# Patient Record
Sex: Male | Born: 1965 | Race: Black or African American | Hispanic: No | Smoking: Former smoker
Health system: Southern US, Community
[De-identification: ages and names within clinical notes are randomized; demographics above are authoritative.]

## PROBLEM LIST (undated history)

## (undated) DIAGNOSIS — I10 Essential (primary) hypertension: Secondary | ICD-10-CM

## (undated) DIAGNOSIS — E785 Hyperlipidemia, unspecified: Secondary | ICD-10-CM

## (undated) DIAGNOSIS — F1011 Alcohol abuse, in remission: Secondary | ICD-10-CM

## (undated) DIAGNOSIS — G5600 Carpal tunnel syndrome, unspecified upper limb: Secondary | ICD-10-CM

## (undated) DIAGNOSIS — K219 Gastro-esophageal reflux disease without esophagitis: Secondary | ICD-10-CM

## (undated) DIAGNOSIS — J302 Other seasonal allergic rhinitis: Secondary | ICD-10-CM

## (undated) DIAGNOSIS — I779 Disorder of arteries and arterioles, unspecified: Secondary | ICD-10-CM

## (undated) DIAGNOSIS — I2510 Atherosclerotic heart disease of native coronary artery without angina pectoris: Secondary | ICD-10-CM

## (undated) DIAGNOSIS — I639 Cerebral infarction, unspecified: Secondary | ICD-10-CM

## (undated) DIAGNOSIS — K760 Fatty (change of) liver, not elsewhere classified: Secondary | ICD-10-CM

## (undated) DIAGNOSIS — N183 Chronic kidney disease, stage 3 unspecified: Secondary | ICD-10-CM

## (undated) DIAGNOSIS — I739 Peripheral vascular disease, unspecified: Secondary | ICD-10-CM

## (undated) DIAGNOSIS — I219 Acute myocardial infarction, unspecified: Secondary | ICD-10-CM

## (undated) HISTORY — DX: Disorder of arteries and arterioles, unspecified: I77.9

## (undated) HISTORY — DX: Hyperlipidemia, unspecified: E78.5

## (undated) HISTORY — PX: OTHER SURGICAL HISTORY: SHX169

## (undated) HISTORY — DX: Acute myocardial infarction, unspecified: I21.9

## (undated) HISTORY — DX: Atherosclerotic heart disease of native coronary artery without angina pectoris: I25.10

## (undated) HISTORY — DX: Gastro-esophageal reflux disease without esophagitis: K21.9

## (undated) HISTORY — PX: CORONARY ANGIOPLASTY: SHX604

## (undated) HISTORY — DX: Carpal tunnel syndrome, unspecified upper limb: G56.00

## (undated) HISTORY — DX: Peripheral vascular disease, unspecified: I73.9

## (undated) HISTORY — DX: Essential (primary) hypertension: I10

## (undated) HISTORY — DX: Alcohol abuse, in remission: F10.11

## (undated) HISTORY — DX: Other seasonal allergic rhinitis: J30.2

---
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
## 2001-10-04 ENCOUNTER — Emergency Department (HOSPITAL_COMMUNITY): Admission: EM | Admit: 2001-10-04 | Discharge: 2001-10-04 | Payer: Self-pay | Admitting: Emergency Medicine

## 2012-02-07 DIAGNOSIS — I219 Acute myocardial infarction, unspecified: Secondary | ICD-10-CM

## 2012-02-07 HISTORY — DX: Acute myocardial infarction, unspecified: I21.9

## 2012-06-18 ENCOUNTER — Encounter: Payer: Self-pay | Admitting: *Deleted

## 2012-06-19 ENCOUNTER — Ambulatory Visit (INDEPENDENT_AMBULATORY_CARE_PROVIDER_SITE_OTHER): Payer: BC Managed Care – PPO | Admitting: Cardiology

## 2012-06-19 ENCOUNTER — Encounter: Payer: Self-pay | Admitting: Cardiology

## 2012-06-19 VITALS — BP 142/90 | HR 69 | Ht 71.5 in | Wt 247.0 lb

## 2012-06-19 DIAGNOSIS — E785 Hyperlipidemia, unspecified: Secondary | ICD-10-CM | POA: Insufficient documentation

## 2012-06-19 DIAGNOSIS — I10 Essential (primary) hypertension: Secondary | ICD-10-CM | POA: Insufficient documentation

## 2012-06-19 DIAGNOSIS — Z720 Tobacco use: Secondary | ICD-10-CM | POA: Insufficient documentation

## 2012-06-19 DIAGNOSIS — I2510 Atherosclerotic heart disease of native coronary artery without angina pectoris: Secondary | ICD-10-CM

## 2012-06-19 DIAGNOSIS — F1011 Alcohol abuse, in remission: Secondary | ICD-10-CM

## 2012-06-19 DIAGNOSIS — F17200 Nicotine dependence, unspecified, uncomplicated: Secondary | ICD-10-CM

## 2012-06-19 DIAGNOSIS — I252 Old myocardial infarction: Secondary | ICD-10-CM | POA: Insufficient documentation

## 2012-06-19 NOTE — Assessment & Plan Note (Signed)
Stable and asymptomatic. Continue secondary preventative therapy. He might benefit from a few weeks to cardiac rehabilitation she has not been involved in this. We'll look into see if his insurance will pay this for out from his infarct.  I strongly encouraged him to stop smoking. I made it very clear to his mother and him that he would remain at significant risk of having another heart attack if he continues to smoke.  Of also released him to go back to work at his former job starting 07/07/2012.  We'll see him back in the office in 3 months.

## 2012-06-19 NOTE — Patient Instructions (Addendum)
Your physician recommends that you schedule a follow-up appointment in: 3 MONTHS WITH TW  Your physician recommends that you return for lab work in: PRIOR TO YOUR FOLLOW UP OFFICE VISIT WITH YOUR PRIMARY CARE PHYSICIAN, PLEASE HAVE THEM FAX A COPY TO [place] A [phone], PLEASE HAVE THEM DRAW A FASTING LIPID PANEL AND CMP  YOUR PHYSICIAN RECOMMENDS THAT YOU STOP SMOKING FOR THE BENEFIT OF YOUR HEALTH  YOU MAY RETURN TO WORK AGAIN ON 07-07-12, LETTER ENCLOSED  WE WILL PLACE A REFERRAL FOR CARDIAC REHAB, SOMEONE WILL CALL YOU WITH THAT APPOINTMENT DATE AND TIME IF APPLICABLE

## 2012-06-19 NOTE — Progress Notes (Signed)
HPI Larry Pham is a 47 year old African  American male who comes today to establish with [place] in cardiology. He has been a patient at [facility] as well as Dr.Chauhan in [place], [place]. 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 He had this while having a stress test and [place]. He was transferred emergently to do her drug-eluting stent placed into his right coronary artery. In January and elective drug-eluting stent to the distal right coronary artery. His ejection fraction 60% by MRI which also showed some mild mitral regurgitation. There is a small infarct in the RCA territory.  He and his mother been unhappy with his care in [place].  He does have symptoms dyspnea on exertion. He denies any angina or chest pain. He is yet returned to work. 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 He continues to smoke about a pack and a half of cigarettes a day. He says he tried everything including Chantix.  Apparently he drank a fair amount of alcohol in the past but stopped altogether about 9 months ago.  Has a history of hypertension gastroesophageal reflux.  He seems to be compliant with his medications. He takes naproxen as needed for arthritis.  Past Medical History  Diagnosis Date  . Hypertension   . Carpal tunnel syndrome   . GERD (gastroesophageal reflux disease)   . Seasonal allergies   . History of ETOH abuse     STOPPED 9 MONTHS AGO  . Hyperlipidemia   . MI (myocardial infarction) 02-2012  . CAD (coronary artery disease)   . Coronary atherosclerosis of artery bypass graft     Current Outpatient Prescriptions  Medication Sig Dispense Refill  . aspirin 81 MG tablet Take 81 mg by mouth daily.      Marland Kitchen buPROPion (WELLBUTRIN) 75 MG tablet Take 150 mg by mouth 2 (two) times daily.      . Cholecalciferol (VITAMIN D-3) 1000 UNITS CAPS Take 2,000 Units by mouth every 8 (eight) hours.      . hydrochlorothiazide (MICROZIDE) 12.5 MG capsule  Take 12.5 mg by mouth 2 (two) times daily.      Marland Kitchen lisinopril (PRINIVIL,ZESTRIL) 40 MG tablet Take 40 mg by mouth daily.      . naproxen (NAPROSYN) 500 MG tablet Take 500 mg by mouth 2 (two) times daily with a meal.      . nitroGLYCERIN (NITROSTAT) 0.4 MG SL tablet Place 0.4 mg under the tongue every 5 (five) minutes as needed for chest pain.      Marland Kitchen omeprazole (PRILOSEC) 20 MG capsule Take 20 mg by mouth daily.      . simvastatin (ZOCOR) 20 MG tablet Take 20 mg by mouth every evening.      . Ticagrelor (BRILINTA) 90 MG TABS tablet Take 90 mg by mouth 2 (two) times daily.       No current facility-administered medications for this visit.    No Known Allergies  Family History  Problem Relation Age of Onset  . Hypertension Mother   . Heart disease Mother   . Cirrhosis Father     DIED AT AGE 60  . Breast cancer Paternal Grandmother     DECEASED    History   Social History  . Marital Status: Married    Spouse Name: N/A    Number of Children: N/A  . Years of Education: N/A   Occupational History  . Not on file.   Social History Main Topics  . Smoking  status: Current Every Day Smoker -- 1.50 packs/day  . Smokeless tobacco: Not on file  . Alcohol Use: Yes     Comment: SOCIALLY   . Drug Use: No  . Sexually Active: Not on file   Other Topics Concern  . Not on file   Social History Narrative  . No narrative on file    ROS ALL NEGATIVE EXCEPT THOSE NOTED IN HPI  PE  General Appearance: well developed, well nourished in no acute distress, muscular, overweight, premature graying HEENT: symmetrical face, PERRLA, good dentition  Neck: no JVD, thyromegaly, or adenopathy, trachea midline Chest: symmetric without deformity Cardiac: PMI non-displaced, RRR, normal S1, S2, no gallop or murmur Lung: clear to ausculation and percussion Vascular: all pulses full without bruits  Abdominal: nondistended, nontender, good bowel sounds, no HSM, no bruits Extremities: no cyanosis,  clubbing or edema, no sign of DVT, no varicosities  Skin: normal color, no rashes Neuro: alert and oriented x 3, non-focal Pysch: normal affect  EKG Normal sinus rhythm, T wave inversion in 1 and aVL as well as in V6. No acute changes. BMET No results found for this basename: na, k, cl, co2, glucose, bun, creatinine, calcium, gfrnonaa, gfraa    Lipid Panel  No results found for this basename: chol, trig, hdl, cholhdl, vldl, ldlcalc    CBC No results found for this basename: wbc, rbc, hgb, hct, plt, mcv, mch, mchc, rdw, neutrabs, lymphsabs, monoabs, eosabs, basosabs

## 2012-08-01 ENCOUNTER — Telehealth: Payer: Self-pay | Admitting: *Deleted

## 2012-08-01 NOTE — Telephone Encounter (Signed)
Noted pt recent OV advised to have labs performed with PCP upcoming, called pt PCP to receive labs via fax and was advised by Larry Pham that the labs were drawn on the first of March and they will fax them, will await receipt and give primary card MD for review and instructions if needed, will contact pt is neccasary

## 2012-08-01 NOTE — Telephone Encounter (Signed)
Received incoming labs via fax, placed on MD Wall per in office today

## 2012-08-01 NOTE — Telephone Encounter (Signed)
MD Wall signed off on labs to report no changes, no contact to pt needs to be made, placed in scan box

## 2012-08-02 ENCOUNTER — Encounter: Payer: Self-pay | Admitting: Cardiology

## 2012-09-18 ENCOUNTER — Ambulatory Visit: Payer: BC Managed Care – PPO | Admitting: Cardiology

## 2012-10-26 ENCOUNTER — Ambulatory Visit: Payer: BC Managed Care – PPO | Admitting: Cardiology

## 2013-03-01 ENCOUNTER — Other Ambulatory Visit: Payer: Self-pay | Admitting: *Deleted

## 2013-03-01 MED ORDER — NITROGLYCERIN 0.4 MG SL SUBL: 0.4000 mg | SUBLINGUAL_TABLET | SUBLINGUAL | Status: DC | PRN

## 2013-03-08 ENCOUNTER — Encounter: Payer: Self-pay | Admitting: *Deleted

## 2013-03-08 ENCOUNTER — Encounter: Payer: BC Managed Care – PPO | Admitting: Adult Health

## 2013-03-08 NOTE — Progress Notes (Signed)
    HPI: [name] is a 47 year old former patient of Dr. Juanito Doom we are following for ongoing management and assessment of known history of CAD, hypertension, ongoing tobacco abuse, and hyperlipidemia. The patient was continued on preventative therapy, and was recommended for cardiac rehabilitation. Patient was strongly encouraged to smoke stop smoking.  No Known Allergies  Current Outpatient Prescriptions  Medication Sig Dispense Refill  . aspirin 81 MG tablet Take 81 mg by mouth daily.      Marland Kitchen buPROPion (WELLBUTRIN) 75 MG tablet Take 150 mg by mouth 2 (two) times daily.      . Cholecalciferol (VITAMIN D-3) 1000 UNITS CAPS Take 2,000 Units by mouth every 8 (eight) hours.      . hydrochlorothiazide (MICROZIDE) 12.5 MG capsule Take 12.5 mg by mouth 2 (two) times daily.      Marland Kitchen lisinopril (PRINIVIL,ZESTRIL) 40 MG tablet Take 40 mg by mouth daily.      . naproxen (NAPROSYN) 500 MG tablet Take 500 mg by mouth 2 (two) times daily with a meal.      . nitroGLYCERIN (NITROSTAT) 0.4 MG SL tablet Place 1 tablet (0.4 mg total) under the tongue every 5 (five) minutes as needed for chest pain.  25 tablet  0  . omeprazole (PRILOSEC) 20 MG capsule Take 20 mg by mouth daily.      . simvastatin (ZOCOR) 20 MG tablet Take 20 mg by mouth every evening.      . Ticagrelor (BRILINTA) 90 MG TABS tablet Take 90 mg by mouth 2 (two) times daily.       No current facility-administered medications for this visit.    Past Medical History  Diagnosis Date  . Hypertension   . Carpal tunnel syndrome   . GERD (gastroesophageal reflux disease)   . Seasonal allergies   . History of ETOH abuse     STOPPED 9 MONTHS AGO  . Hyperlipidemia   . MI (myocardial infarction) 02-2012  . CAD (coronary artery disease)   . Coronary atherosclerosis of artery bypass graft     Past Surgical History  Procedure Laterality Date  . Coronary artery catheterization with stenting N/A 02-2102 AND 05-2012    ROS: PHYSICAL EXAM There  were no vitals taken for this visit.  EKG:  ASSESSMENT AND PLAN

## 2013-04-12 ENCOUNTER — Ambulatory Visit: Payer: BC Managed Care – PPO | Admitting: Interventional Cardiology

## 2013-05-10 ENCOUNTER — Encounter: Payer: Self-pay | Admitting: *Deleted

## 2013-06-04 NOTE — Progress Notes (Signed)
Clinical Summary [name] is a 48 y.o.male former patient of Dr Verl Blalock, this is our first visit together. He is seen for the following medical problems. He has also previously been followed at [facility] and in [place], [place]  1. CAD - prior inferior MI 02/2012 at [facility], s/p DES to RCA. LVEF 60%. - has had some chest pain. Sharp pain in midchest, 6/10. +SOB +palpitations. Typically occurs with exertion, often after long walks. Does not occur at rest. Not positional, resolves with rest. Improved with NG. Ongoing for 6 months. Typically occurs 2-3 times a week, stable severity, some increase in frequency.  - mixed compliance with meds: he is on brillinta and ASA, on since stent was placed.  - had been having some DOE at last visit  2. Hyperlipidemia - 06/2012 TC 204 TG 150 HDL 44 LDL 130 - reports he has been on lipitor on lower dose at that time  3. HTN - checks bp at 1-2 times per week. Typically around 140s/90s.   Past Medical History  Diagnosis Date  . Hypertension   . Carpal tunnel syndrome   . GERD (gastroesophageal reflux disease)   . Seasonal allergies   . History of ETOH abuse     STOPPED 9 MONTHS AGO  . Hyperlipidemia   . MI (myocardial infarction) 02-2012  . CAD (coronary artery disease)   . Coronary atherosclerosis of artery bypass graft      No Known Allergies   Current Outpatient Prescriptions  Medication Sig Dispense Refill  . aspirin 81 MG tablet Take 81 mg by mouth daily.      Marland Kitchen buPROPion (WELLBUTRIN) 75 MG tablet Take 150 mg by mouth 2 (two) times daily.      . Cholecalciferol (VITAMIN D-3) 1000 UNITS CAPS Take 2,000 Units by mouth every 8 (eight) hours.      . hydrochlorothiazide (MICROZIDE) 12.5 MG capsule Take 12.5 mg by mouth 2 (two) times daily.      Marland Kitchen lisinopril (PRINIVIL,ZESTRIL) 40 MG tablet Take 40 mg by mouth daily.      . naproxen (NAPROSYN) 500 MG tablet Take 500 mg by mouth 2 (two) times daily with a meal.      . nitroGLYCERIN  (NITROSTAT) 0.4 MG SL tablet Place 1 tablet (0.4 mg total) under the tongue every 5 (five) minutes as needed for chest pain.  25 tablet  0  . omeprazole (PRILOSEC) 20 MG capsule Take 20 mg by mouth daily.      . simvastatin (ZOCOR) 20 MG tablet Take 20 mg by mouth every evening.      . Ticagrelor (BRILINTA) 90 MG TABS tablet Take 90 mg by mouth 2 (two) times daily.       No current facility-administered medications for this visit.     Past Surgical History  Procedure Laterality Date  . Coronary artery catheterization with stenting N/A 02-2102 AND 05-2012     No Known Allergies    Family History  Problem Relation Age of Onset  . Hypertension Mother   . Heart disease Mother   . Cirrhosis Father     DIED AT AGE 80  . Breast cancer Paternal Grandmother     DECEASED     Social History Mr. Lewallen reports that he has been smoking.  He does not have any smokeless tobacco history on file. Mr. Nannini reports that he drinks alcohol.   Review of Systems CONSTITUTIONAL: No weight loss, fever, chills, weakness or fatigue.  HEENT: Eyes:  No visual loss, blurred vision, double vision or yellow sclerae.No hearing loss, sneezing, congestion, runny nose or sore throat.  SKIN: No rash or itching.  CARDIOVASCULAR: per HPI RESPIRATORY: No shortness of breath, cough or sputum.  GASTROINTESTINAL: No anorexia, nausea, vomiting or diarrhea. No abdominal pain or blood.  GENITOURINARY: No burning on urination, no polyuria NEUROLOGICAL: No headache, dizziness, syncope, paralysis, ataxia, numbness or tingling in the extremities. No change in bowel or bladder control.  MUSCULOSKELETAL: No muscle, back pain, joint pain or stiffness.  LYMPHATICS: No enlarged nodes. No history of splenectomy.  PSYCHIATRIC: No history of depression or anxiety.  ENDOCRINOLOGIC: No reports of sweating, cold or heat intolerance. No polyuria or polydipsia.  Marland Kitchen   Physical Examination p 77 bp 155/91 Wt 231 lbs BMI 32 Gen:  resting comfortably, no acute distress HEENT: no scleral icterus, pupils equal round and reactive, no palptable cervical adenopathy,  CV: RRR, no m/r/g, no JVD, no carotid bruits Resp: Clear to auscultation bilaterally GI: abdomen is soft, non-tender, non-distended, normal bowel sounds, no hepatosplenomegaly MSK: extremities are warm, no edema.  Skin: warm, no rash Neuro:  no focal deficits Psych: appropriate affect   Diagnostic Studies  06/05/13 Clinic EKG Sinus rhythm, no ischemic changes   Assessment and Plan   1. CAD - will obtain Lexiscan to evaluate for ischemia and risk stratify in the setting of chest pain - increase Toprol XL to 50mg  daily as antianginal - will request prior records from [facility]  2. Hyperlipidemia - not at goal based on prior panel however this was prior to his lipitor being nicreased.  - will need repeat panel if not done recently by PCP  3. HTN - elevated in clinic, follow response with increased Toprol   Follow up 1 month  Arnoldo Lenis, M.D., F.A.C.C.

## 2013-06-05 ENCOUNTER — Ambulatory Visit (INDEPENDENT_AMBULATORY_CARE_PROVIDER_SITE_OTHER): Payer: BC Managed Care – PPO | Admitting: Cardiology

## 2013-06-05 ENCOUNTER — Encounter: Payer: Self-pay | Admitting: Cardiology

## 2013-06-05 ENCOUNTER — Encounter: Payer: Self-pay | Admitting: *Deleted

## 2013-06-05 VITALS — BP 155/91 | HR 77 | Ht 71.0 in | Wt 231.0 lb

## 2013-06-05 DIAGNOSIS — I10 Essential (primary) hypertension: Secondary | ICD-10-CM

## 2013-06-05 DIAGNOSIS — E785 Hyperlipidemia, unspecified: Secondary | ICD-10-CM

## 2013-06-05 DIAGNOSIS — I2510 Atherosclerotic heart disease of native coronary artery without angina pectoris: Secondary | ICD-10-CM

## 2013-06-05 MED ORDER — METOPROLOL SUCCINATE ER 50 MG PO TB24: 50.0000 mg | ORAL_TABLET | Freq: Every day | ORAL | Status: DC

## 2013-06-05 NOTE — Patient Instructions (Signed)
Your physician recommends that you schedule a follow-up appointment in: 1 month with Dr [name]  Your physician has requested that you have a lexiscan myoview. For further information please visit [email or web address]. Please follow instruction sheet, as given.  Your physician has recommended you make the following change in your medication:  1. Increased Toprol XL to 50 mg daily

## 2013-06-10 ENCOUNTER — Other Ambulatory Visit (HOSPITAL_COMMUNITY): Payer: BC Managed Care – PPO

## 2013-06-12 ENCOUNTER — Encounter (HOSPITAL_COMMUNITY)
Admission: RE | Admit: 2013-06-12 | Discharge: 2013-06-12 | Disposition: A | Discharge status: Home / Self Care | Payer: BC Managed Care – PPO | Source: Ambulatory Visit | Attending: Cardiology | Admitting: Cardiology

## 2013-06-12 ENCOUNTER — Encounter (HOSPITAL_COMMUNITY): Payer: Self-pay

## 2013-06-12 DIAGNOSIS — I2510 Atherosclerotic heart disease of native coronary artery without angina pectoris: Secondary | ICD-10-CM

## 2013-06-12 DIAGNOSIS — R079 Chest pain, unspecified: Secondary | ICD-10-CM

## 2013-06-12 DIAGNOSIS — I10 Essential (primary) hypertension: Secondary | ICD-10-CM

## 2013-06-12 MED ORDER — TECHNETIUM TC 99M SESTAMIBI - CARDIOLITE
30.0000 | Freq: Once | INTRAVENOUS | Status: AC | PRN
  Administered 2013-06-12: 10:00:00 30 via INTRAVENOUS

## 2013-06-12 MED ORDER — TECHNETIUM TC 99M SESTAMIBI - CARDIOLITE
10.0000 | Freq: Once | INTRAVENOUS | Status: AC | PRN
  Administered 2013-06-12: 08:00:00 10 via INTRAVENOUS

## 2013-06-12 MED ORDER — SODIUM CHLORIDE 0.9 % IJ SOLN
INTRAMUSCULAR | Status: AC
  Administered 2013-06-12: 10 mL via INTRAVENOUS
  Filled 2013-06-12: qty 10

## 2013-06-12 MED ORDER — REGADENOSON 0.4 MG/5ML IV SOLN
INTRAVENOUS | Status: AC
  Administered 2013-06-12: 0.4 mg via INTRAVENOUS
  Filled 2013-06-12: qty 5

## 2013-06-12 NOTE — Progress Notes (Signed)
Stress Lab Nurses Notes - Larry Pham 06/12/2013 Reason for doing test: CAD and Chest Pain Type of test: Wille Glaser Nurse performing test: Gerrit Halls, RN Nuclear Medicine Tech: Melburn Hake Echo Tech: Not Applicable MD performing test: Koneswaran/K.Lawrence NP Family MD: Ahmed Prima Test explained and consent signed: yes IV started: 22g jelco, Saline lock flushed, No redness or edema and Saline lock started in radiology Symptoms: SOB Treatment/Intervention: None Reason test stopped: protocol completed After recovery IV was: Discontinued via X-ray tech and No redness or edema Patient to return to Larry Pham. Med at : 10:15 Patient discharged: Home Patient's Condition upon discharge was: stable Comments: During test BP 181/108 & HR 108.  Recovery BP 163/102 & HR 88.  Symptoms resolved in recovery. Took Meds from home . Larry Pham

## 2013-07-09 ENCOUNTER — Encounter: Payer: Self-pay | Admitting: Cardiology

## 2013-07-09 ENCOUNTER — Ambulatory Visit (INDEPENDENT_AMBULATORY_CARE_PROVIDER_SITE_OTHER): Payer: BC Managed Care – PPO | Admitting: Cardiology

## 2013-07-09 VITALS — BP 155/83 | HR 70 | Ht 71.0 in | Wt 232.0 lb

## 2013-07-09 DIAGNOSIS — I2510 Atherosclerotic heart disease of native coronary artery without angina pectoris: Secondary | ICD-10-CM

## 2013-07-09 DIAGNOSIS — I10 Essential (primary) hypertension: Secondary | ICD-10-CM

## 2013-07-09 DIAGNOSIS — E785 Hyperlipidemia, unspecified: Secondary | ICD-10-CM

## 2013-07-09 MED ORDER — ISOSORBIDE MONONITRATE ER 30 MG PO TB24: 30.0000 mg | ORAL_TABLET | Freq: Every day | ORAL | Status: DC

## 2013-07-09 NOTE — Progress Notes (Signed)
Clinical Summary Mr. Stapel is a 48 y.o.male seen today for follow up for the following medical problems.   1. CAD  - prior PCI to RCA in [place], [place] in setting of inferior STEMI. Noted distal LM disease of 50% at that time.  - continued to have symptoms, seen at [facility]. [facility] MRI showed small RCA infarct with no current ischemic defects.  - Jan 2014 cath at [facility] with PCI to RCA with DES, left main disease described as 40%  .  - still with some symptoms with heavy exertion at times - recent stress MPI showed no ischemia   2. Hyperlipidemia  - 06/2012 TC 204 TG 150 HDL 44 LDL 130  - reports he was on lipitor on lower dose at that time   3. HTN  - checks bp at 1-2 times per week. Typically around 140s/80s. - compliant with meds, however has not taken meds yet today  4. Tobacco - electronic and patches did not work. Chantix has not worked before.   Past Medical History  Diagnosis Date  . Hypertension   . Carpal tunnel syndrome   . GERD (gastroesophageal reflux disease)   . Seasonal allergies   . History of ETOH abuse     STOPPED 9 MONTHS AGO  . Hyperlipidemia   . MI (myocardial infarction) 02-2012  . CAD (coronary artery disease)   . Coronary atherosclerosis of artery bypass graft      Allergies  Allergen Reactions  . Bee Venom      Current Outpatient Prescriptions  Medication Sig Dispense Refill  . aspirin 81 MG tablet Take 81 mg by mouth daily.      Marland Kitchen atorvastatin (LIPITOR) 80 MG tablet Take 80 mg by mouth daily.      Marland Kitchen buPROPion (WELLBUTRIN) 75 MG tablet Take 150 mg by mouth 2 (two) times daily.      . Cholecalciferol (VITAMIN D-3) 1000 UNITS CAPS Take 2,000 Units by mouth every 8 (eight) hours.      . hydrochlorothiazide (MICROZIDE) 12.5 MG capsule Take 12.5 mg by mouth 2 (two) times daily.      Marland Kitchen lisinopril (PRINIVIL,ZESTRIL) 40 MG tablet Take 40 mg by mouth daily.      . metoprolol succinate (TOPROL-XL) 50 MG 24 hr tablet Take 1 tablet (50 mg total) by  mouth daily.  30 tablet  6  . naproxen (NAPROSYN) 500 MG tablet Take 500 mg by mouth 2 (two) times daily with a meal.      . nitroGLYCERIN (NITROSTAT) 0.4 MG SL tablet Place 1 tablet (0.4 mg total) under the tongue every 5 (five) minutes as needed for chest pain.  25 tablet  0  . omeprazole (PRILOSEC) 20 MG capsule Take 20 mg by mouth daily.      . Ticagrelor (BRILINTA) 90 MG TABS tablet Take 90 mg by mouth 2 (two) times daily.       No current facility-administered medications for this visit.     Past Surgical History  Procedure Laterality Date  . Coronary artery catheterization with stenting N/A 02-2102 AND 05-2012     Allergies  Allergen Reactions  . Bee Venom       Family History  Problem Relation Age of Onset  . Hypertension Mother   . Heart disease Mother   . Cirrhosis Father     DIED AT AGE 83  . Breast cancer Paternal Grandmother     DECEASED     Social History Mr. Crumpler reports  that he has been smoking.  He does not have any smokeless tobacco history on file. Mr. Herting reports that he drinks alcohol.   Review of Systems CONSTITUTIONAL: No weight loss, fever, chills, weakness or fatigue.  HEENT: Eyes: No visual loss, blurred vision, double vision or yellow sclerae.No hearing loss, sneezing, congestion, runny nose or sore throat.  SKIN: No rash or itching.  CARDIOVASCULAR: per HPI RESPIRATORY: No shortness of breath, cough or sputum.  GASTROINTESTINAL: No anorexia, nausea, vomiting or diarrhea. No abdominal pain or blood.  GENITOURINARY: No burning on urination, no polyuria NEUROLOGICAL: No headache, dizziness, syncope, paralysis, ataxia, numbness or tingling in the extremities. No change in bowel or bladder control.  MUSCULOSKELETAL: No muscle, back pain, joint pain or stiffness.  LYMPHATICS: No enlarged nodes. No history of splenectomy.  PSYCHIATRIC: No history of depression or anxiety.  ENDOCRINOLOGIC: No reports of sweating, cold or heat intolerance. No  polyuria or polydipsia.  Marland Kitchen   Physical Examination p 70 bp 155/83 Wt 232 lbs BMI 32 Gen: resting comfortably, no acute distress HEENT: no scleral icterus, pupils equal round and reactive, no palptable cervical adenopathy,  CV: RRR, no m/r/g, no JVD, no carotid bruits Resp: Clear to auscultation bilaterally GI: abdomen is soft, non-tender, non-distended, normal bowel sounds, no hepatosplenomegaly MSK: extremities are warm, no edema.  Skin: warm, no rash Neuro:  no focal deficits Psych: appropriate affect   Diagnostic Studies 05/2013 MPI Analysis of the raw data showed no increased extracardiac radiotracer uptake. Analysis of the perfusion images showed a small mild fixed mid inferior wall defect. Left ventricular cavity size was normal. Regional wall motion was normal, indicating that the defect was due to soft tissue attenuation artifact. Left ventricular systolic function was calculated to be mildly reduced, EF 42%. However, systolic function appeared to be normal with an approximated EF of 50%.  IMPRESSION: 1. Normal myocardial perfusion imaging. No evidence of ischemia or scar.  2. Left ventricular systolic function was calculated to be mildly reduced, EF 42%. However, systolic function appeared to be grossly normal with an approximated EF of 50%.   05/2012 Cath at [facility]iac Catheter[facility] Results: Coronary arteries: Left main: 40% distal Left anterior descending: 30% ostial, scattered irregs Left circumflex: 40% mid, scattered irregs Right coronary: widely patent proximal stent, 70% mid, 40% at angle, diffuse irregs FFR of left main into LAD: 0.81  PCI Percutaneous coronary intervention performed of the mid RCA lesion which was stented with a 3.0 mm x 20mm Promus DES Results: 70% to normal  INTERVENTIONAL IMPRESSIONS and RECOMMENDATIONS: Successful PCI of the mid RCA lesion Aspirin 81 mg daily indefinitely Ticagrelor (Brilinta) 90 mg PO BID for at  least 12 months Risk factor modification, smoking cessation     Assessment and Plan  1. CAD - prior interventions on RCA x 2, most recent [facility] with DES [facility]. Of note he also has 40-50% left main disease - occasional symptoms with high levels of exertion, will start low dose imdur - has completed 1 year of brillinta, will stop - continue current medications, he needs ultra aggressive risk factor modification given his mild-mod LM disease  2. Hyperlipidemia  - not at goal based on prior panel however this was prior to his lipitor being nicreased.  - will need repeat panel if not done recently by PCP   3. HTN  - elevated in clinic, however has not taken his medications yet today - continue to follow blood pressures.   4.  Tobacco abuse - counseled extensively on health risk, especially given his young age and aggressive coronary disease   Follow up 6 months      Arnoldo Lenis, M.D., F.A.C.C.

## 2013-07-09 NOTE — Patient Instructions (Signed)
Your physician wants you to follow-up in: 6 months You will receive a reminder letter in the mail two months in advance. If you don't receive a letter, please call our office to schedule the follow-up appointment.   Your physician has recommended you make the following change in your medication:     START Imdur 30 mg daily    Thank you for choosing [facility] !

## 2013-07-11 ENCOUNTER — Telehealth: Payer: Self-pay

## 2013-07-11 NOTE — Telephone Encounter (Signed)
Spoke with pt,delivered Dr.Branch's message,pt verbalized understanding

## 2013-07-11 NOTE — Telephone Encounter (Signed)
Message copied by Bernita Raisin on Thu Jul 11, 2013  8:18 AM ------      Message from: Carlyle Dolly F      Created: Tue Jul 09, 2013  8:07 PM       Please let patient know that I looked at the cath report from [place], it is ok to stop his brillinta. He had the one artery that they stented, but also his left main artery(the most important artery in the heart) showed 40-50% blockage which was not enough to do anything about at that time. However, we need to do everything we can to keep this blockage from getting worst (controlling blood pressure, cholesterol, and stopping smoking).Without great control of these there is a fairly high chance he will need a bypass surgery in the next 5-10 years             Carlyle Dolly MD ------

## 2013-08-03 ENCOUNTER — Emergency Department (HOSPITAL_COMMUNITY): Payer: BC Managed Care – PPO

## 2013-08-03 ENCOUNTER — Emergency Department (HOSPITAL_COMMUNITY)
Admission: EM | Admit: 2013-08-03 | Discharge: 2013-08-03 | Disposition: A | Discharge status: Home / Self Care | Payer: BC Managed Care – PPO | Attending: Emergency Medicine | Admitting: Emergency Medicine

## 2013-08-03 ENCOUNTER — Encounter (HOSPITAL_COMMUNITY): Payer: Self-pay | Admitting: Emergency Medicine

## 2013-08-03 DIAGNOSIS — Z7982 Long term (current) use of aspirin: Secondary | ICD-10-CM | POA: Insufficient documentation

## 2013-08-03 DIAGNOSIS — S298XXA Other specified injuries of thorax, initial encounter: Secondary | ICD-10-CM | POA: Insufficient documentation

## 2013-08-03 DIAGNOSIS — F17200 Nicotine dependence, unspecified, uncomplicated: Secondary | ICD-10-CM | POA: Insufficient documentation

## 2013-08-03 DIAGNOSIS — Y93F2 Activity, caregiving, lifting: Secondary | ICD-10-CM | POA: Insufficient documentation

## 2013-08-03 DIAGNOSIS — I10 Essential (primary) hypertension: Secondary | ICD-10-CM | POA: Insufficient documentation

## 2013-08-03 DIAGNOSIS — Z79899 Other long term (current) drug therapy: Secondary | ICD-10-CM | POA: Insufficient documentation

## 2013-08-03 DIAGNOSIS — Y929 Unspecified place or not applicable: Secondary | ICD-10-CM | POA: Insufficient documentation

## 2013-08-03 DIAGNOSIS — Y990 Civilian activity done for income or pay: Secondary | ICD-10-CM | POA: Insufficient documentation

## 2013-08-03 DIAGNOSIS — X500XXA Overexertion from strenuous movement or load, initial encounter: Secondary | ICD-10-CM | POA: Insufficient documentation

## 2013-08-03 DIAGNOSIS — S335XXA Sprain of ligaments of lumbar spine, initial encounter: Secondary | ICD-10-CM | POA: Insufficient documentation

## 2013-08-03 DIAGNOSIS — K219 Gastro-esophageal reflux disease without esophagitis: Secondary | ICD-10-CM | POA: Insufficient documentation

## 2013-08-03 DIAGNOSIS — I2510 Atherosclerotic heart disease of native coronary artery without angina pectoris: Secondary | ICD-10-CM | POA: Insufficient documentation

## 2013-08-03 DIAGNOSIS — E785 Hyperlipidemia, unspecified: Secondary | ICD-10-CM | POA: Insufficient documentation

## 2013-08-03 DIAGNOSIS — Z8669 Personal history of other diseases of the nervous system and sense organs: Secondary | ICD-10-CM | POA: Insufficient documentation

## 2013-08-03 DIAGNOSIS — Z951 Presence of aortocoronary bypass graft: Secondary | ICD-10-CM | POA: Insufficient documentation

## 2013-08-03 DIAGNOSIS — Z9861 Coronary angioplasty status: Secondary | ICD-10-CM | POA: Insufficient documentation

## 2013-08-03 DIAGNOSIS — S39012A Strain of muscle, fascia and tendon of lower back, initial encounter: Secondary | ICD-10-CM

## 2013-08-03 DIAGNOSIS — I252 Old myocardial infarction: Secondary | ICD-10-CM | POA: Insufficient documentation

## 2013-08-03 MED ORDER — CYCLOBENZAPRINE HCL 10 MG PO TABS: 10.0000 mg | ORAL_TABLET | Freq: Three times a day (TID) | ORAL | Status: DC | PRN

## 2013-08-03 MED ORDER — HYDROCODONE-ACETAMINOPHEN 5-325 MG PO TABS: ORAL_TABLET | ORAL | Status: DC

## 2013-08-03 NOTE — ED Notes (Signed)
Xray called concerning results of chest, states results are back but are not resulting in Epic, will print and send copy over

## 2013-08-03 NOTE — ED Notes (Signed)
Pt with lower back pain since yesterday morning, denies any injury, denies taking anything for pain relief or using ice or heat, states pain radiates to buttocks, denies incont of urine or bowels, pt did state he worked last night and does some lifting of about 30lbs

## 2013-08-03 NOTE — Discharge Instructions (Signed)
Lumbosacral Strain Lumbosacral strain is a strain of any of the parts that make up your lumbosacral vertebrae. Your lumbosacral vertebrae are the bones that make up the lower third of your backbone. Your lumbosacral vertebrae are held together by muscles and tough, fibrous tissue (ligaments).  CAUSES  A sudden blow to your back can cause lumbosacral strain. Also, anything that causes an excessive stretch of the muscles in the low back can cause this strain. This is typically seen when people exert themselves strenuously, fall, lift heavy objects, bend, or crouch repeatedly. RISK FACTORS  Physically demanding work.  Participation in pushing or pulling sports or sports that require sudden twist of the back (tennis, golf, baseball).  Weight lifting.  Excessive lower back curvature.  Forward-tilted pelvis.  Weak back or abdominal muscles or both.  Tight hamstrings. SIGNS AND SYMPTOMS  Lumbosacral strain may cause pain in the area of your injury or pain that moves (radiates) down your leg.  DIAGNOSIS Your health care provider can often diagnose lumbosacral strain through a physical exam. In some cases, you may need tests such as X-ray exams.  TREATMENT  Treatment for your lower back injury depends on many factors that your clinician will have to evaluate. However, most treatment will include the use of anti-inflammatory medicines. HOME CARE INSTRUCTIONS   Avoid hard physical activities (tennis, racquetball, waterskiing) if you are not in proper physical condition for it. This may aggravate or create problems.  If you have a back problem, avoid sports requiring sudden body movements. Swimming and walking are generally safer activities.  Maintain good posture.  Maintain a healthy weight.  For acute conditions, you may put ice on the injured area.  Put ice in a plastic bag.  Place a towel between your skin and the bag.  Leave the ice on for 20 minutes, 2 3 times a day.  When the  low back starts healing, stretching and strengthening exercises may be recommended. SEEK MEDICAL CARE IF:  Your back pain is getting worse.  You experience severe back pain not relieved with medicines. SEEK IMMEDIATE MEDICAL CARE IF:   You have numbness, tingling, weakness, or problems with the use of your arms or legs.  There is a change in bowel or bladder control.  You have increasing pain in any area of the body, including your belly (abdomen).  You notice shortness of breath, dizziness, or feel faint.  You feel sick to your stomach (nauseous), are throwing up (vomiting), or become sweaty.  You notice discoloration of your toes or legs, or your feet get very cold. MAKE SURE YOU:   Understand these instructions.  Will watch your condition.  Will get help right away if you are not doing well or get worse. Document Released: 02/02/2005 Document Revised: 02/13/2013 Document Reviewed: 12/12/2012 [employer] Patient Information 2014 [employer].  

## 2013-08-03 NOTE — ED Provider Notes (Signed)
CSN: [id number]     Arrival date & time 08/03/13  1039 History   First MD Initiated Contact with Patient 08/03/13 1111     Chief Complaint  Patient presents with  . Back Pain     (Consider location/radiation/quality/duration/timing/severity/associated sxs/prior Treatment) Patient is a 48 y.o. male presenting with back pain. The history is provided by the patient.  Back Pain Location:  Lumbar spine Quality:  Aching Radiates to:  Does not radiate Pain severity:  Moderate Pain is:  Same all the time Onset quality:  Gradual Duration:  1 day Timing:  Constant Progression:  Unchanged Chronicity:  New Context: not falling, not lifting heavy objects and not recent illness   Relieved by:  Bed rest Worsened by:  Bending, twisting and movement Ineffective treatments:  None tried Associated symptoms: chest pain   Associated symptoms: no abdominal pain, no abdominal swelling, no bladder incontinence, no bowel incontinence, no dysuria, no fever, no headaches, no leg pain, no numbness, no paresthesias, no pelvic pain, no perianal numbness, no tingling, no weakness and no weight loss   Chest pain:    Chest pain quality: "soreness"   Severity:  Mild   Onset quality:  Gradual   Duration:  1 day   Timing:  Intermittent   Progression:  Unchanged   Chronicity:  New  Patient also c/o intermittent "soreness" to the mid sternum for 1-2 days.  Pain worse with palpation and improves at rest.  He denies fever, chills, shortness of breath, squeezing or pressure to his chest, sweating, nausea or arm pain.     Past Medical History  Diagnosis Date  . Hypertension   . Carpal tunnel syndrome   . GERD (gastroesophageal reflux disease)   . Seasonal allergies   . History of ETOH abuse     STOPPED 9 MONTHS AGO  . Hyperlipidemia   . MI (myocardial infarction) 02-2012  . CAD (coronary artery disease)   . Coronary atherosclerosis of artery bypass graft    Past Surgical History  Procedure Laterality  Date  . Coronary artery catheterization with stenting N/A 02-2102 AND 05-2012   Family History  Problem Relation Age of Onset  . Hypertension Mother   . Heart disease Mother   . Cirrhosis Father     DIED AT AGE 70  . Breast cancer Paternal Grandmother     DECEASED   History  Substance Use Topics  . Smoking status: Current Every Day Smoker -- 1.50 packs/day for 30 years  . Smokeless tobacco: Not on file  . Alcohol Use: Yes     Comment: SOCIALLY     Review of Systems  Constitutional: Negative for fever and weight loss.  Respiratory: Negative for cough, chest tightness and shortness of breath.   Cardiovascular: Positive for chest pain.  Gastrointestinal: Negative for vomiting, abdominal pain, constipation and bowel incontinence.  Genitourinary: Negative for bladder incontinence, dysuria, hematuria, flank pain, decreased urine volume, difficulty urinating and pelvic pain.       No perineal numbness or incontinence of urine or feces  Musculoskeletal: Positive for back pain. Negative for joint swelling, neck pain and neck stiffness.  Skin: Negative for rash.  Neurological: Negative for tingling, weakness, numbness, headaches and paresthesias.  All other systems reviewed and are negative.      Allergies  Bee venom  Home Medications   Current Outpatient Rx  Name  Route  Sig  Dispense  Refill  . aspirin 81 MG tablet   Oral   Take 81 mg  by mouth daily.         Marland Kitchen atorvastatin (LIPITOR) 80 MG tablet   Oral   Take 80 mg by mouth daily.         Marland Kitchen buPROPion (WELLBUTRIN) 75 MG tablet   Oral   Take 150 mg by mouth 2 (two) times daily.         . Cholecalciferol (VITAMIN D-3) 1000 UNITS CAPS   Oral   Take 2,000 Units by mouth every 8 (eight) hours.         . hydrochlorothiazide (MICROZIDE) 12.5 MG capsule   Oral   Take 25 mg by mouth 2 (two) times daily.          . isosorbide mononitrate (IMDUR) 30 MG 24 hr tablet   Oral   Take 1 tablet (30 mg total) by mouth  daily.   90 tablet   3   . lisinopril (PRINIVIL,ZESTRIL) 40 MG tablet   Oral   Take 40 mg by mouth daily.         . metoprolol succinate (TOPROL-XL) 50 MG 24 hr tablet   Oral   Take 1 tablet (50 mg total) by mouth daily.   30 tablet   6   . naproxen (NAPROSYN) 500 MG tablet   Oral   Take 500 mg by mouth 2 (two) times daily with a meal.         . nitroGLYCERIN (NITROSTAT) 0.4 MG SL tablet   Sublingual   Place 1 tablet (0.4 mg total) under the tongue every 5 (five) minutes as needed for chest pain.   25 tablet   0     PATIENT NEEDS TO CALL OUR OFFICE TO SCHEDULE A FOL ...   . omeprazole (PRILOSEC) 20 MG capsule   Oral   Take 20 mg by mouth daily.         . Ticagrelor (BRILINTA) 90 MG TABS tablet   Oral   Take 90 mg by mouth 2 (two) times daily.          BP 167/97  Pulse 82  Temp(Src) 98 F (36.7 C) (Oral)  Resp 17  Ht 5\' 11"  (1.803 m)  Wt 227 lb (102.967 kg)  BMI 31.67 kg/m2  SpO2 100% Physical Exam  Nursing note and vitals reviewed. Constitutional: He is oriented to person, place, and time. He appears well-developed and well-nourished. No distress.  HENT:  Head: Normocephalic and atraumatic.  Neck: Normal range of motion. Neck supple.  Cardiovascular: Normal rate, regular rhythm, normal heart sounds and intact distal pulses.   No murmur heard. Pulmonary/Chest: Effort normal and breath sounds normal. No respiratory distress. He exhibits tenderness.  Localized ttp of the mid sternum.  No edema, crepitus or erythema.  No rash  Abdominal: Soft. He exhibits no distension. There is no tenderness.  Musculoskeletal: He exhibits tenderness. He exhibits no edema.       Lumbar back: He exhibits tenderness and pain. He exhibits normal range of motion, no swelling, no deformity, no laceration and normal pulse.  ttp of the left lumbar paraspinal muscles.  No spinal tenderness.  DP pulses are brisk and symmetrical.  Distal sensation intact.  Hip Flexors/Extensors are  intact.  Pain to left back reproduced with SLR on left.    Neurological: He is alert and oriented to person, place, and time. He has normal strength. No sensory deficit. He exhibits normal muscle tone. Coordination and gait normal.  Reflex Scores:      Patellar reflexes are  2+ on the right side and 2+ on the left side.      Achilles reflexes are 2+ on the right side and 2+ on the left side. Skin: Skin is warm and dry. No rash noted.    ED Course  Procedures (including critical care time) Labs Review Labs Reviewed - No data to display Imaging Review Dg Chest 2 View  08/03/2013   CLINICAL DATA:  Central chest pain.  Back pain.  EXAM: CHEST  2 VIEW  COMPARISON:  None.  FINDINGS: Mild cardiomegaly without edema. Mild thoracic spondylosis. No pleural effusion identified although the posterior costophrenic angles were excluded. Equivocal subsegmental atelectasis in the right lower lobe.  IMPRESSION: 1. Possible subsegmental atelectasis in the right lower lobe. 2. Mild cardiomegaly, without edema.   Electronically Signed   By: Sherryl Barters M.D.   On: 08/03/2013 12:50    EKG Interpretation   Date/Time:  Saturday August 03 2013 12:01:29 EDT Ventricular Rate:  66 PR Interval:  152 QRS Duration: 90 QT Interval:  404 QTC Calculation: 423 R Axis:   65 Text Interpretation:  Normal sinus rhythm Anterior infarct , age  undetermined Non-specific ST-t changes Confirmed by Wilson Singer  MD, Reese  ([id number]) on 08/03/2013 12:28:42 PM      MDM   Final diagnoses:  Lumbar strain    Patient reviewed on the [place] narcotics database with no recent narcotics listed  Patient discussed with Dr. Wilson Singer.    VSS.  No dyspnea, tachycardia, hypoxia, tachypnea.  Wells PE score is 0.  Clinical suspicion for PE or ACS is low.  Pt agrees to symptomatic treatment and close f/u with PMD or to return here for any worsening symptoms.  Patient is ambulatory with a steady gait.  no focal deficits.     Kayleen Alig L. Chenika Nevils,  PA-C 08/05/13 1340

## 2013-08-03 NOTE — ED Notes (Signed)
Pt c/o lower back pain that is worse with movement that started yesterday, denies any injury,

## 2013-08-03 NOTE — ED Notes (Signed)
Pt verbalized understanding of no driving within 4 hours of taking vicodin due to med may cause drowsiness

## 2013-08-06 ENCOUNTER — Encounter: Payer: Self-pay | Admitting: Cardiology

## 2013-08-07 ENCOUNTER — Emergency Department (HOSPITAL_COMMUNITY)
Admission: EM | Admit: 2013-08-07 | Discharge: 2013-08-07 | Disposition: A | Discharge status: Home / Self Care | Payer: BC Managed Care – PPO | Attending: Emergency Medicine | Admitting: Emergency Medicine

## 2013-08-07 ENCOUNTER — Encounter (HOSPITAL_COMMUNITY): Payer: Self-pay | Admitting: Emergency Medicine

## 2013-08-07 DIAGNOSIS — M545 Low back pain, unspecified: Secondary | ICD-10-CM

## 2013-08-07 DIAGNOSIS — Z8669 Personal history of other diseases of the nervous system and sense organs: Secondary | ICD-10-CM | POA: Insufficient documentation

## 2013-08-07 DIAGNOSIS — Z7982 Long term (current) use of aspirin: Secondary | ICD-10-CM | POA: Insufficient documentation

## 2013-08-07 DIAGNOSIS — I10 Essential (primary) hypertension: Secondary | ICD-10-CM | POA: Insufficient documentation

## 2013-08-07 DIAGNOSIS — R059 Cough, unspecified: Secondary | ICD-10-CM | POA: Insufficient documentation

## 2013-08-07 DIAGNOSIS — R05 Cough: Secondary | ICD-10-CM | POA: Insufficient documentation

## 2013-08-07 DIAGNOSIS — I25810 Atherosclerosis of coronary artery bypass graft(s) without angina pectoris: Secondary | ICD-10-CM | POA: Insufficient documentation

## 2013-08-07 DIAGNOSIS — Z9861 Coronary angioplasty status: Secondary | ICD-10-CM | POA: Insufficient documentation

## 2013-08-07 DIAGNOSIS — Z79899 Other long term (current) drug therapy: Secondary | ICD-10-CM | POA: Insufficient documentation

## 2013-08-07 DIAGNOSIS — F17200 Nicotine dependence, unspecified, uncomplicated: Secondary | ICD-10-CM | POA: Insufficient documentation

## 2013-08-07 DIAGNOSIS — Z791 Long term (current) use of non-steroidal anti-inflammatories (NSAID): Secondary | ICD-10-CM | POA: Insufficient documentation

## 2013-08-07 DIAGNOSIS — K219 Gastro-esophageal reflux disease without esophagitis: Secondary | ICD-10-CM | POA: Insufficient documentation

## 2013-08-07 DIAGNOSIS — Z951 Presence of aortocoronary bypass graft: Secondary | ICD-10-CM | POA: Insufficient documentation

## 2013-08-07 DIAGNOSIS — I252 Old myocardial infarction: Secondary | ICD-10-CM | POA: Insufficient documentation

## 2013-08-07 DIAGNOSIS — E785 Hyperlipidemia, unspecified: Secondary | ICD-10-CM | POA: Insufficient documentation

## 2013-08-07 MED ORDER — OXYCODONE-ACETAMINOPHEN 5-325 MG PO TABS: 1.0000 | ORAL_TABLET | ORAL | Status: DC | PRN

## 2013-08-07 NOTE — ED Provider Notes (Signed)
CSN: [id number]     Arrival date & time 08/07/13  1641 History   First MD Initiated Contact with Patient 08/07/13 1701     Chief Complaint  Patient presents with  . Back Pain     (Consider location/radiation/quality/duration/timing/severity/associated sxs/prior Treatment) Patient is a 48 y.o. male presenting with back pain. The history is provided by the patient.  Back Pain Location:  Lumbar spine Quality:  Aching Radiates to:  R thigh Pain severity:  Moderate Pain is:  Same all the time Onset quality:  Gradual Duration:  1 week Timing:  Constant Progression:  Unchanged Context: not recent injury   Relieved by:  Heating pad Worsened by:  Ambulation, bending and movement Associated symptoms: no abdominal pain, no bladder incontinence, no bowel incontinence, no chest pain, no dysuria, no fever and no headaches    Larry Pham is a 48 y.o. male who presents to the ED with low back pain. He was evaluated here 08/03/2013 for the same problem. He had a complete work up with EKG, x-rays and was treated with muscle relaxants and hydrocodone. He continues to have pain that is only a little better and is not going down the right leg. He was scheduled to return to work today but decided he would return here due to the continued pain. He denies loss of control of bladder or bowels or any other problems today.   Past Medical History  Diagnosis Date  . Hypertension   . Carpal tunnel syndrome   . GERD (gastroesophageal reflux disease)   . Seasonal allergies   . History of ETOH abuse     STOPPED 9 MONTHS AGO  . Hyperlipidemia   . MI (myocardial infarction) 02-2012  . CAD (coronary artery disease)   . Coronary atherosclerosis of artery bypass graft    Past Surgical History  Procedure Laterality Date  . Coronary artery catheterization with stenting N/A 02-2102 AND 05-2012   Family History  Problem Relation Age of Onset  . Hypertension Mother   . Heart disease Mother   . Cirrhosis  Father     DIED AT AGE 14  . Breast cancer Paternal Grandmother     DECEASED   History  Substance Use Topics  . Smoking status: Current Every Day Smoker -- 1.50 packs/day for 30 years  . Smokeless tobacco: Not on file  . Alcohol Use: Yes     Comment: SOCIALLY     Review of Systems  Constitutional: Negative for fever and chills.  HENT: Negative.   Eyes: Negative for pain and visual disturbance.  Respiratory: Positive for cough. Negative for shortness of breath.   Cardiovascular: Negative for chest pain.  Gastrointestinal: Negative for nausea, vomiting, abdominal pain and bowel incontinence.  Genitourinary: Negative for bladder incontinence and dysuria.  Musculoskeletal: Positive for back pain.  Skin: Negative for wound.  Neurological: Negative for syncope and headaches.  Psychiatric/Behavioral: Negative for confusion. The patient is not nervous/anxious.       Allergies  Bee venom  Home Medications   Current Outpatient Rx  Name  Route  Sig  Dispense  Refill  . aspirin EC 81 MG tablet   Oral   Take 81 mg by mouth every morning.         Marland Kitchen atorvastatin (LIPITOR) 80 MG tablet   Oral   Take 80 mg by mouth daily.         Marland Kitchen buPROPion (WELLBUTRIN) 75 MG tablet   Oral   Take 150 mg by mouth  2 (two) times daily.         . Cholecalciferol (VITAMIN D-3) 1000 UNITS CAPS   Oral   Take 2,000 Units by mouth daily.          . cyclobenzaprine (FLEXERIL) 10 MG tablet   Oral   Take 1 tablet (10 mg total) by mouth 3 (three) times daily as needed.   21 tablet   0   . hydrochlorothiazide (HYDRODIURIL) 25 MG tablet   Oral   Take 25 mg by mouth daily.         Marland Kitchen HYDROcodone-acetaminophen (NORCO/VICODIN) 5-325 MG per tablet      Take one-two tabs po q 4-6 hrs prn pain   20 tablet   0   . lisinopril (PRINIVIL,ZESTRIL) 40 MG tablet   Oral   Take 40 mg by mouth daily.         . metoprolol succinate (TOPROL-XL) 50 MG 24 hr tablet   Oral   Take 1 tablet (50 mg  total) by mouth daily.   30 tablet   6   . naproxen (NAPROSYN) 500 MG tablet   Oral   Take 500 mg by mouth 2 (two) times daily with a meal.         . omeprazole (PRILOSEC) 20 MG capsule   Oral   Take 20 mg by mouth daily.         . isosorbide mononitrate (IMDUR) 30 MG 24 hr tablet   Oral   Take 1 tablet (30 mg total) by mouth daily.   90 tablet   3   . nitroGLYCERIN (NITROSTAT) 0.4 MG SL tablet   Sublingual   Place 1 tablet (0.4 mg total) under the tongue every 5 (five) minutes as needed for chest pain.   25 tablet   0     PATIENT NEEDS TO CALL OUR OFFICE TO SCHEDULE A FOL ...    BP 173/98  Pulse 86  Temp(Src) 98.5 F (36.9 C) (Oral)  Resp 16  Ht 5' 10.5" (1.791 m)  Wt 227 lb (102.967 kg)  BMI 32.10 kg/m2  SpO2 100% Physical Exam  Nursing note and vitals reviewed. Constitutional: He is oriented to person, place, and time. He appears well-developed and well-nourished. No distress.  HENT:  Head: Normocephalic and atraumatic.  Eyes: EOM are normal. Pupils are equal, round, and reactive to light.  Neck: Normal range of motion. Neck supple.  Cardiovascular: Normal rate and regular rhythm.   Pulmonary/Chest: Effort normal. No respiratory distress. He has no wheezes. He has no rales.  Abdominal: Soft. Bowel sounds are normal. There is no tenderness.  Musculoskeletal: Normal range of motion. He exhibits no edema.       Lumbar back: He exhibits tenderness. He exhibits normal range of motion, no deformity, no spasm and normal pulse.  Neurological: He is alert and oriented to person, place, and time. He has normal strength. No cranial nerve deficit or sensory deficit. Coordination and gait normal.  Reflex Scores:      Bicep reflexes are 2+ on the right side and 2+ on the left side.      Brachioradialis reflexes are 2+ on the right side and 2+ on the left side.      Patellar reflexes are 2+ on the right side and 2+ on the left side.      Achilles reflexes are 2+ on the  right side and 2+ on the left side. Pedal pulses equal, adequate circulation, good touch sensation. Ambulatory without  foot drag.   Skin: Skin is warm and dry.  Psychiatric: He has a normal mood and affect. His behavior is normal.    ED Course  Procedures   MDM  48 y.o. male with persistent low back pain that now radiates down his right leg. Stable for discharge with normal neuro exam. Will continue his muscle relaxant and I will give him a limited number of Percocet until he can follow up with ortho. He will call tomorrow for an appointment. Discussed with the patient and all questioned fully answered. He will return if any problems arise.    Medication List    TAKE these medications       oxyCODONE-acetaminophen 5-325 MG per tablet  Commonly known as:  ROXICET  Take 1 tablet by mouth every 4 (four) hours as needed for severe pain.      ASK your doctor about these medications       aspirin EC 81 MG tablet  Take 81 mg by mouth every morning.     atorvastatin 80 MG tablet  Commonly known as:  LIPITOR  Take 80 mg by mouth daily.     buPROPion 75 MG tablet  Commonly known as:  WELLBUTRIN  Take 150 mg by mouth 2 (two) times daily.     cyclobenzaprine 10 MG tablet  Commonly known as:  FLEXERIL  Take 1 tablet (10 mg total) by mouth 3 (three) times daily as needed.     hydrochlorothiazide 25 MG tablet  Commonly known as:  HYDRODIURIL  Take 25 mg by mouth daily.     HYDROcodone-acetaminophen 5-325 MG per tablet  Commonly known as:  NORCO/VICODIN  Take one-two tabs po q 4-6 hrs prn pain     isosorbide mononitrate 30 MG 24 hr tablet  Commonly known as:  IMDUR  Take 1 tablet (30 mg total) by mouth daily.     lisinopril 40 MG tablet  Commonly known as:  PRINIVIL,ZESTRIL  Take 40 mg by mouth daily.     metoprolol succinate 50 MG 24 hr tablet  Commonly known as:  TOPROL-XL  Take 1 tablet (50 mg total) by mouth daily.     naproxen 500 MG tablet  Commonly known as:   NAPROSYN  Take 500 mg by mouth 2 (two) times daily with a meal.     nitroGLYCERIN 0.4 MG SL tablet  Commonly known as:  NITROSTAT  Place 1 tablet (0.4 mg total) under the tongue every 5 (five) minutes as needed for chest pain.     omeprazole 20 MG capsule  Commonly known as:  PRILOSEC  Take 20 mg by mouth daily.     Vitamin D-3 1000 UNITS Caps  Take 2,000 Units by mouth daily.           [place], [place] 08/08/13 [phone]

## 2013-08-07 NOTE — ED Notes (Signed)
Here for back pain on Saturday. Pt states pain began radiating down right leg today.

## 2013-08-08 NOTE — ED Provider Notes (Signed)
Medical screening examination/treatment/procedure(s) were performed by non-physician practitioner and as supervising physician I was immediately available for consultation/collaboration.   EKG Interpretation   Date/Time:  Saturday August 03 2013 12:01:29 EDT Ventricular Rate:  66 PR Interval:  152 QRS Duration: 90 QT Interval:  404 QTC Calculation: 423 R Axis:   65 Text Interpretation:  Normal sinus rhythm Anterior infarct , age  undetermined Non-specific ST-t changes Confirmed by Wilson Singer  MD, Kayden Hutmacher  ([id number]) on 08/03/2013 12:28:42 PM       Virgel Manifold, MD 08/08/13 [phone]

## 2013-08-09 NOTE — ED Provider Notes (Signed)
Medical screening examination/treatment/procedure(s) were performed by non-physician practitioner and as supervising physician I was immediately available for consultation/collaboration.   Luceil Herrin M Marky Buresh, MD 08/09/13 1419 

## 2014-04-16 ENCOUNTER — Other Ambulatory Visit: Payer: Self-pay | Admitting: Cardiology

## 2014-04-16 MED ORDER — NITROGLYCERIN 0.4 MG SL SUBL: 0.4000 mg | SUBLINGUAL_TABLET | SUBLINGUAL | Status: DC | PRN

## 2014-04-16 NOTE — Telephone Encounter (Signed)
Needs refill on NTG sent to [facility] in [place] on [address] / tgs

## 2014-04-16 NOTE — Telephone Encounter (Signed)
Refill complete 

## 2014-05-29 ENCOUNTER — Ambulatory Visit: Payer: BC Managed Care – PPO | Admitting: Cardiology

## 2014-06-11 ENCOUNTER — Encounter: Payer: Self-pay | Admitting: *Deleted

## 2014-06-11 ENCOUNTER — Encounter: Payer: Self-pay | Admitting: Cardiology

## 2014-06-11 ENCOUNTER — Ambulatory Visit (INDEPENDENT_AMBULATORY_CARE_PROVIDER_SITE_OTHER): Payer: BLUE CROSS/BLUE SHIELD | Admitting: Cardiology

## 2014-06-11 VITALS — BP 144/87 | HR 71 | Ht 71.0 in | Wt 238.0 lb

## 2014-06-11 DIAGNOSIS — E785 Hyperlipidemia, unspecified: Secondary | ICD-10-CM

## 2014-06-11 DIAGNOSIS — I10 Essential (primary) hypertension: Secondary | ICD-10-CM

## 2014-06-11 DIAGNOSIS — I2510 Atherosclerotic heart disease of native coronary artery without angina pectoris: Secondary | ICD-10-CM

## 2014-06-11 DIAGNOSIS — Z720 Tobacco use: Secondary | ICD-10-CM

## 2014-06-11 MED ORDER — ISOSORBIDE MONONITRATE ER 30 MG PO TB24: 30.0000 mg | ORAL_TABLET | Freq: Every day | ORAL | Status: DC

## 2014-06-11 NOTE — Patient Instructions (Signed)
Your physician wants you to follow-up in: [place] [address] [facility] You will receive a reminder letter in the mail two months in advance. If you don't receive a letter, please call our office to schedule the follow-up appointment.  Your physician has recommended you make the following change in your medication:   START IMDUR 30 MG DAILY  CONTINUE ALL OTHER MEDICATIONS AS DIRECTED  WE WILL REQUEST LABS FROM DR. STRADER  Thank you for choosing [facility]!!

## 2014-06-11 NOTE — Progress Notes (Signed)
Clinical Summary Larry Pham is a 49 y.o.male seen today for follow up of the following medical problems.   1. CAD  - prior PCI to RCA in [place], [place] in setting of inferior STEMI. Noted distal LM disease of 50% at that time.  - continued to have symptoms, seen at [facility]. Adensoine MRI showed small RCA infarct with no current ischemic defects.  - 02/2012 echo 50-55% - Jan 2014 cath at [facility] with PCI to RCA with DES, left main disease described as 40% - Jan 2015 MPI without ischemia  - last visit had some intermittent chest pain. Asked to start imdur 30mg  daily but appears he has not started. No chest pain since last visit but can have some SOB with higher levels of exertion that improves with NG.     2. Hyperlipidemia  - compliant with high dose statin  3. HTN  - Typically around 120s/70-80s at home - compliant with meds, however has not taken meds yet today  4. Tobacco - electronic and patches did not work. Chantix has not worked before.  - not interested in quitting   Past Medical History  Diagnosis Date  . Hypertension   . Carpal tunnel syndrome   . GERD (gastroesophageal reflux disease)   . Seasonal allergies   . History of ETOH abuse     STOPPED 9 MONTHS AGO  . Hyperlipidemia   . MI (myocardial infarction) 02-2012  . CAD (coronary artery disease)   . Coronary atherosclerosis of artery bypass graft      Allergies  Allergen Reactions  . Bee Venom      Current Outpatient Prescriptions  Medication Sig Dispense Refill  . aspirin EC 81 MG tablet Take 81 mg by mouth every morning.    Marland Kitchen atorvastatin (LIPITOR) 80 MG tablet Take 80 mg by mouth daily.    Marland Kitchen buPROPion (WELLBUTRIN) 75 MG tablet Take 150 mg by mouth 2 (two) times daily.    . Cholecalciferol (VITAMIN D-3) 1000 UNITS CAPS Take 2,000 Units by mouth daily.     . cyclobenzaprine (FLEXERIL) 10 MG tablet Take 1 tablet (10 mg total) by mouth 3 (three) times daily as needed. 21 tablet 0  .  hydrochlorothiazide (HYDRODIURIL) 25 MG tablet Take 25 mg by mouth daily.    Marland Kitchen HYDROcodone-acetaminophen (NORCO/VICODIN) 5-325 MG per tablet Take one-two tabs po q 4-6 hrs prn pain 20 tablet 0  . isosorbide mononitrate (IMDUR) 30 MG 24 hr tablet Take 1 tablet (30 mg total) by mouth daily. 90 tablet 3  . lisinopril (PRINIVIL,ZESTRIL) 40 MG tablet Take 40 mg by mouth daily.    . metoprolol succinate (TOPROL-XL) 50 MG 24 hr tablet Take 1 tablet (50 mg total) by mouth daily. 30 tablet 6  . naproxen (NAPROSYN) 500 MG tablet Take 500 mg by mouth 2 (two) times daily with a meal.    . nitroGLYCERIN (NITROSTAT) 0.4 MG SL tablet Place 1 tablet (0.4 mg total) under the tongue every 5 (five) minutes as needed for chest pain. 25 tablet 3  . omeprazole (PRILOSEC) 20 MG capsule Take 20 mg by mouth daily.    Marland Kitchen oxyCODONE-acetaminophen (ROXICET) 5-325 MG per tablet Take 1 tablet by mouth every 4 (four) hours as needed for severe pain. 16 tablet 0   No current facility-administered medications for this visit.     Past Surgical History  Procedure Laterality Date  . Coronary artery catheterization with stenting N/A 02-2102 AND 05-2012     Allergies  Allergen Reactions  . Bee Venom       Family History  Problem Relation Age of Onset  . Hypertension Mother   . Heart disease Mother   . Cirrhosis Father     DIED AT AGE 43  . Breast cancer Paternal Grandmother     DECEASED     Social History Mr. Marker reports that he has been smoking.  He does not have any smokeless tobacco history on file. Mr. Lefebre reports that he drinks alcohol.   Review of Systems CONSTITUTIONAL: No weight loss, fever, chills, weakness or fatigue.  HEENT: Eyes: No visual loss, blurred vision, double vision or yellow sclerae.No hearing loss, sneezing, congestion, runny nose or sore throat.  SKIN: No rash or itching.  CARDIOVASCULAR: per HPI RESPIRATORY: No cough or sputum.  GASTROINTESTINAL: No anorexia, nausea, vomiting or  diarrhea. No abdominal pain or blood.  GENITOURINARY: No burning on urination, no polyuria NEUROLOGICAL: No headache, dizziness, syncope, paralysis, ataxia, numbness or tingling in the extremities. No change in bowel or bladder control.  MUSCULOSKELETAL: No muscle, back pain, joint pain or stiffness.  LYMPHATICS: No enlarged nodes. No history of splenectomy.  PSYCHIATRIC: No history of depression or anxiety.  ENDOCRINOLOGIC: No reports of sweating, cold or heat intolerance. No polyuria or polydipsia.  Marland Kitchen   Physical Examination p 71 bp 144/87 Wt 238 lbs BMI 33 Gen: resting comfortably, no acute distress HEENT: no scleral icterus, pupils equal round and reactive, no palptable cervical adenopathy,  CV: RRR, no m/r/g, no JVD Resp: Clear to auscultation bilaterally GI: abdomen is soft, non-tender, non-distended, normal bowel sounds, no hepatosplenomegaly MSK: extremities are warm, no edema.  Skin: warm, no rash Neuro:  no focal deficits Psych: appropriate affect   Diagnostic Studies 05/2013 MPI Analysis of the raw data showed no increased extracardiac radiotracer uptake. Analysis of the perfusion images showed a small mild fixed mid inferior wall defect. Left ventricular cavity size was normal. Regional wall motion was normal, indicating that the defect was due to soft tissue attenuation artifact. Left ventricular systolic function was calculated to be mildly reduced, EF 42%. However, systolic function appeared to be normal with an approximated EF of 50%.  IMPRESSION: 1. Normal myocardial perfusion imaging. No evidence of ischemia or scar.  2. Left ventricular systolic function was calculated to be mildly reduced, EF 42%. However, systolic function appeared to be grossly normal with an approximated EF of 50%.   05/2012 Cath at [facility]iac Catheter[facility] Results: Coronary arteries: Left main: 40% distal Left anterior descending: 30% ostial, scattered irregs Left  circumflex: 40% mid, scattered irregs Right coronary: widely patent proximal stent, 70% mid, 40% at angle, diffuse irregs FFR of left main into LAD: 0.81  PCI Percutaneous coronary intervention performed of the mid RCA lesion which was stented with a 3.0 mm x 68mm Promus DES Results: 70% to normal  INTERVENTIONAL IMPRESSIONS and RECOMMENDATIONS: Successful PCI of the mid RCA lesion Aspirin 81 mg daily indefinitely Ticagrelor (Brilinta) 90 mg PO BID for at least 12 months Risk factor modification, smoking cessation       Assessment and Plan   1. CAD - prior interventions on RCA x 2, most recent at [facility] with DES placement. Of note he also has 40-50% left main disease - occasional symptoms with high levels of exertion, will start low dose imdur - did not start imdur from last visit, will reorder Rx for 30mg  daily.   2. Hyperlipidemia  - continue high dose statin  in setting of known CAD  - request most recent panel from pcp  3. HTN  - elevated in clinic, however has not taken his medications yet today - continue to follow blood pressures.   4. Tobacco abuse - counseled extensively on health risk, especially given his young age and aggressive coronary disease  F/u 6 months   Arnoldo Lenis, M.D.

## 2014-10-24 ENCOUNTER — Ambulatory Visit: Payer: BLUE CROSS/BLUE SHIELD | Admitting: Urology

## 2014-10-28 ENCOUNTER — Telehealth: Payer: Self-pay

## 2014-10-28 NOTE — Telephone Encounter (Signed)
Pt referred for colonoscopy. ( Had sent a letter in March 2016) Received a second referral. Tried to call pt and mailbox is full.  Will mail another letter to call.

## 2014-11-17 ENCOUNTER — Other Ambulatory Visit (HOSPITAL_COMMUNITY): Payer: Self-pay | Admitting: Nurse Practitioner

## 2014-11-17 DIAGNOSIS — R519 Headache, unspecified: Secondary | ICD-10-CM

## 2014-11-17 DIAGNOSIS — R51 Headache: Principal | ICD-10-CM

## 2014-11-27 ENCOUNTER — Ambulatory Visit (HOSPITAL_COMMUNITY): Admission: RE | Admit: 2014-11-27 | Payer: BLUE CROSS/BLUE SHIELD | Source: Ambulatory Visit

## 2014-12-09 ENCOUNTER — Ambulatory Visit (HOSPITAL_COMMUNITY)
Admission: RE | Admit: 2014-12-09 | Discharge: 2014-12-09 | Disposition: A | Discharge status: Home / Self Care | Payer: BLUE CROSS/BLUE SHIELD | Source: Ambulatory Visit | Attending: Nurse Practitioner | Admitting: Nurse Practitioner

## 2014-12-09 ENCOUNTER — Other Ambulatory Visit (HOSPITAL_COMMUNITY): Payer: Self-pay | Admitting: Nurse Practitioner

## 2014-12-09 DIAGNOSIS — R51 Headache: Secondary | ICD-10-CM | POA: Insufficient documentation

## 2014-12-09 DIAGNOSIS — M542 Cervicalgia: Secondary | ICD-10-CM | POA: Insufficient documentation

## 2014-12-09 DIAGNOSIS — I639 Cerebral infarction, unspecified: Secondary | ICD-10-CM

## 2014-12-09 DIAGNOSIS — Z139 Encounter for screening, unspecified: Secondary | ICD-10-CM

## 2014-12-09 DIAGNOSIS — R519 Headache, unspecified: Secondary | ICD-10-CM

## 2014-12-09 HISTORY — DX: Cerebral infarction, unspecified: I63.9

## 2014-12-09 LAB — POCT I-STAT CREATININE: Creatinine, Ser: 1.1 mg/dL (ref 0.61–1.24)

## 2014-12-09 MED ORDER — GADOBENATE DIMEGLUMINE 529 MG/ML IV SOLN
20.0000 mL | Freq: Once | INTRAVENOUS | Status: AC | PRN
  Administered 2014-12-09: 20 mL via INTRAVENOUS

## 2014-12-11 ENCOUNTER — Ambulatory Visit (INDEPENDENT_AMBULATORY_CARE_PROVIDER_SITE_OTHER): Payer: BLUE CROSS/BLUE SHIELD | Admitting: Cardiology

## 2014-12-11 ENCOUNTER — Encounter: Payer: Self-pay | Admitting: *Deleted

## 2014-12-11 ENCOUNTER — Telehealth: Payer: Self-pay | Admitting: Cardiology

## 2014-12-11 ENCOUNTER — Encounter: Payer: Self-pay | Admitting: Cardiology

## 2014-12-11 VITALS — BP 164/99 | HR 68 | Ht 71.0 in | Wt 240.8 lb

## 2014-12-11 DIAGNOSIS — I10 Essential (primary) hypertension: Secondary | ICD-10-CM

## 2014-12-11 DIAGNOSIS — E785 Hyperlipidemia, unspecified: Secondary | ICD-10-CM

## 2014-12-11 DIAGNOSIS — R079 Chest pain, unspecified: Secondary | ICD-10-CM

## 2014-12-11 DIAGNOSIS — I2510 Atherosclerotic heart disease of native coronary artery without angina pectoris: Secondary | ICD-10-CM | POA: Diagnosis not present

## 2014-12-11 MED ORDER — METOPROLOL SUCCINATE ER 50 MG PO TB24: 50.0000 mg | ORAL_TABLET | Freq: Every day | ORAL | Status: DC

## 2014-12-11 NOTE — Progress Notes (Signed)
Patient ID: Larry Pham, male   DOB: March 23, 1966, 49 y.o.   MRN: [id number]     Clinical Summary Larry Pham is a 49 y.o.male seen today for follow up of the following medical problems.   1. CAD  - prior PCI to RCA in [place], [place] in setting of inferior STEMI. Noted distal LM disease of 50% at that time.  - continued to have symptoms, seen at [facility]. Adensoine MRI showed small RCA infarct with no current ischemic defects.  - 02/2012 echo 50-55% - Jan 2014 cath at [facility] with PCI to RCA with DES, left main disease described as 40% - Jan 2015 MPI without ischemia  - reports recent episodes of chest pain. Mainly with anxiety. Can get pressure in midchest 7-8/10. Often better with NG - he stopped his imdur, states his symptoms seemed to be worst with taking daily.    2. Hyperlipidemia  - compliant with high dose statin  3. HTN  - Typically around 120s/70-80s at home by his report - compliant with meds, however has not taken meds yet today  4. Tobacco - electronic cigs and patches did not work. Chantix has not worked before.  - currently not interested in quitting Past Medical History  Diagnosis Date  . Hypertension   . Carpal tunnel syndrome   . GERD (gastroesophageal reflux disease)   . Seasonal allergies   . History of ETOH abuse     STOPPED 9 MONTHS AGO  . Hyperlipidemia   . MI (myocardial infarction) 02-2012  . CAD (coronary artery disease)   . Coronary atherosclerosis of artery bypass graft      Allergies  Allergen Reactions  . Bee Venom      Current Outpatient Prescriptions  Medication Sig Dispense Refill  . aspirin EC 81 MG tablet Take 81 mg by mouth every morning.    Marland Kitchen atorvastatin (LIPITOR) 80 MG tablet Take 80 mg by mouth daily.    Marland Kitchen buPROPion (WELLBUTRIN) 75 MG tablet Take 150 mg by mouth 2 (two) times daily.    . Cholecalciferol (VITAMIN D-3) 1000 UNITS CAPS Take 2,000 Units by mouth daily.     . hydrochlorothiazide (HYDRODIURIL) 25 MG tablet Take 25  mg by mouth daily.    Marland Kitchen HYDROcodone-acetaminophen (NORCO/VICODIN) 5-325 MG per tablet Take one-two tabs po q 4-6 hrs prn pain 20 tablet 0  . isosorbide mononitrate (IMDUR) 30 MG 24 hr tablet Take 1 tablet (30 mg total) by mouth daily. 90 tablet 3  . lisinopril (PRINIVIL,ZESTRIL) 40 MG tablet Take 40 mg by mouth daily.    . metoprolol succinate (TOPROL-XL) 25 MG 24 hr tablet Take 25 mg by mouth daily.  3  . naproxen (NAPROSYN) 500 MG tablet Take 500 mg by mouth 2 (two) times daily with a meal.    . nitroGLYCERIN (NITROSTAT) 0.4 MG SL tablet Place 1 tablet (0.4 mg total) under the tongue every 5 (five) minutes as needed for chest pain. 25 tablet 3  . omeprazole (PRILOSEC) 20 MG capsule Take 20 mg by mouth daily.     No current facility-administered medications for this visit.     Past Surgical History  Procedure Laterality Date  . Coronary artery catheterization with stenting N/A 02-2102 AND 05-2012     Allergies  Allergen Reactions  . Bee Venom       Family History  Problem Relation Age of Onset  . Hypertension Mother   . Heart disease Mother   . Cirrhosis Father  DIED AT AGE 29  . Breast cancer Paternal Grandmother     DECEASED     Social History Larry Pham reports that he has been smoking Cigarettes.  He started smoking about 37 years ago. He has a 45 pack-year smoking history. He has never used smokeless tobacco. Larry Pham reports that he drinks alcohol.   Review of Systems CONSTITUTIONAL: No weight loss, fever, chills, weakness or fatigue.  HEENT: Eyes: No visual loss, blurred vision, double vision or yellow sclerae.No hearing loss, sneezing, congestion, runny nose or sore throat.  SKIN: No rash or itching.  CARDIOVASCULAR: per HPI RESPIRATORY: No shortness of breath, cough or sputum.  GASTROINTESTINAL: No anorexia, nausea, vomiting or diarrhea. No abdominal pain or blood.  GENITOURINARY: No burning on urination, no polyuria NEUROLOGICAL: No headache, dizziness,  syncope, paralysis, ataxia, numbness or tingling in the extremities. No change in bowel or bladder control.  MUSCULOSKELETAL: No muscle, back pain, joint pain or stiffness.  LYMPHATICS: No enlarged nodes. No history of splenectomy.  PSYCHIATRIC: No history of depression or anxiety.  ENDOCRINOLOGIC: No reports of sweating, cold or heat intolerance. No polyuria or polydipsia.  Marland Kitchen   Physical Examination Filed Vitals:   12/11/14 1455  BP: 164/99  Pulse: 68   Filed Vitals:   12/11/14 1455  Height: 5\' 11"  (1.803 m)  Weight: 240 lb 12.8 oz (109.226 kg)    Gen: resting comfortably, no acute distress HEENT: no scleral icterus, pupils equal round and reactive, no palptable cervical adenopathy,  CV: RRR, no m/r/g, no JVD Resp: Clear to auscultation bilaterally GI: abdomen is soft, non-tender, non-distended, normal bowel sounds, no hepatosplenomegaly MSK: extremities are warm, no edema.  Skin: warm, no rash Neuro:  no focal deficits Psych: appropriate affect   Diagnostic Studies 05/2013 MPI Analysis of the raw data showed no increased extracardiac radiotracer uptake. Analysis of the perfusion images showed a small mild fixed mid inferior wall defect. Left ventricular cavity size was normal. Regional wall motion was normal, indicating that the defect was due to soft tissue attenuation artifact. Left ventricular systolic function was calculated to be mildly reduced, EF 42%. However, systolic function appeared to be normal with an approximated EF of 50%.  IMPRESSION: 1. Normal myocardial perfusion imaging. No evidence of ischemia or scar.  2. Left ventricular systolic function was calculated to be mildly reduced, EF 42%. However, systolic function appeared to be grossly normal with an approximated EF of 50%.   05/2012 Cath at [facility] Left [facility] Cardiac Catheterization Results: Coronary arteries: Left main: 40% distal Left anterior descending: 30% ostial, scattered irregs Left  circumflex: 40% mid, scattered irregs Right coronary: widely patent proximal stent, 70% mid, 40% at angle, diffuse irregs FFR of left main into LAD: 0.81  PCI Percutaneous coronary intervention performed of the mid RCA lesion which was stented with a 3.0 mm x 89mm Promus DES Results: 70% to normal  INTERVENTIONAL IMPRESSIONS and RECOMMENDATIONS: Successful PCI of the mid RCA lesion Aspirin 81 mg daily indefinitely Ticagrelor (Brilinta) 90 mg PO BID for at least 12 months Risk factor modification, smoking cessation    Assessment and Plan  1. CAD - prior interventions on RCA x 2, most recent at [facility] with DES placement. Of note he also has 40-50% left main disease - reports recent increase in chest pains, primarily with feeling anxious. Over a year since his last stress test, given his moderate LM disease we will repeat stress test to further risk stratify. Obtain exercise cardiolite.  -  increase Toprol XL to 50mg  daily for additional antianginal therapy   2. Hyperlipidemia  - continue high dose statin in setting of known CAD   3. HTN  - elevated in clinic, however has not taken his medications yet today - continue to follow blood pressures.   4. Tobacco abuse - counseled extensively on health risk, especially given his young age and aggressive coronary disease  F/u 1 month      Arnoldo Lenis, M.D

## 2014-12-11 NOTE — Patient Instructions (Addendum)
   Remain off of the Imdur (Isosorbide).  Increase Toprol XL to 50mg  daily - may take 2 tabs of your 25mg  tab daily till finish current supply.  New 50mg  tab sent to [facility] today. Continue all other medications.   Your physician has requested that you have en exercise stress myoview. For further information please visit [email or web address]. Please follow instruction sheet, as given. Office will contact with results via phone or letter.   Follow up in  1 month

## 2014-12-11 NOTE — Telephone Encounter (Signed)
Exercise Cardiolite - chest pain  Scheduled for 12-22-14 at [facility]

## 2014-12-22 ENCOUNTER — Encounter (HOSPITAL_COMMUNITY)
Admission: RE | Admit: 2014-12-22 | Discharge: 2014-12-22 | Disposition: A | Discharge status: Home / Self Care | Payer: BLUE CROSS/BLUE SHIELD | Source: Ambulatory Visit | Attending: Cardiology | Admitting: Cardiology

## 2014-12-22 ENCOUNTER — Encounter (HOSPITAL_COMMUNITY): Payer: Self-pay

## 2014-12-22 ENCOUNTER — Inpatient Hospital Stay (HOSPITAL_COMMUNITY): Admission: RE | Admit: 2014-12-22 | Payer: BLUE CROSS/BLUE SHIELD | Source: Ambulatory Visit

## 2014-12-22 DIAGNOSIS — R079 Chest pain, unspecified: Secondary | ICD-10-CM | POA: Diagnosis not present

## 2014-12-22 LAB — NM MYOCAR MULTI W/SPECT W/WALL MOTION / EF
CHL CUP NUCLEAR SSS: 3
CHL RATE OF PERCEIVED EXERTION: 20
CSEPED: 6 min
CSEPEW: 8.2 METS
Exercise duration (sec): 24 s
LHR: 0.28
LV dias vol: 139 mL
LV sys vol: 66 mL
MPHR: 169 {beats}/min
NUC STRESS TID: 1.36
Peak HR: 120 {beats}/min
Percent HR: 71 %
Rest HR: 55 {beats}/min
SDS: 0
SRS: 3

## 2014-12-22 MED ORDER — REGADENOSON 0.4 MG/5ML IV SOLN
INTRAVENOUS | Status: AC
  Administered 2014-12-22: 0.4 mg via INTRAVENOUS
  Filled 2014-12-22: qty 5

## 2014-12-22 MED ORDER — SODIUM CHLORIDE 0.9 % IJ SOLN
INTRAMUSCULAR | Status: AC
  Administered 2014-12-22: 10 mL via INTRAVENOUS
  Filled 2014-12-22: qty 3

## 2014-12-22 MED ORDER — TECHNETIUM TC 99M SESTAMIBI GENERIC - CARDIOLITE
10.0000 | Freq: Once | INTRAVENOUS | Status: AC | PRN
  Administered 2014-12-22: 10.4 via INTRAVENOUS

## 2014-12-22 MED ORDER — TECHNETIUM TC 99M SESTAMIBI - CARDIOLITE
30.0000 | Freq: Once | INTRAVENOUS | Status: AC | PRN
  Administered 2014-12-22: 12:00:00 30 via INTRAVENOUS

## 2014-12-23 ENCOUNTER — Telehealth: Payer: Self-pay | Admitting: *Deleted

## 2014-12-23 NOTE — Telephone Encounter (Signed)
Pt aware, routed to pcp 

## 2014-12-23 NOTE — Telephone Encounter (Signed)
-----   Message from Arnoldo Lenis, MD sent at 12/23/2014 10:19 AM EDT ----- Stress test overall looks good, no evidence of any significant new blockages. Will discuss further at f/u  J BrancH MD

## 2015-01-06 ENCOUNTER — Telehealth: Payer: Self-pay

## 2015-01-06 NOTE — Telephone Encounter (Signed)
Pt was referred by Angelina Ok, NP for screening colonoscopy.  I called to triage pt and he said he doesn't drink much, then he said 2-3 beers weekly. Has hx of alcohol abuse in chart. Will schedule OV to get scheduled for the colonoscopy.

## 2015-01-06 NOTE — Telephone Encounter (Signed)
Pt is scheduled for OV with Walden Field, NP on 01/28/2015 at 9:30 Am.

## 2015-01-13 ENCOUNTER — Ambulatory Visit: Payer: BLUE CROSS/BLUE SHIELD | Admitting: Cardiology

## 2015-01-15 ENCOUNTER — Ambulatory Visit: Payer: BLUE CROSS/BLUE SHIELD | Admitting: Cardiology

## 2015-01-28 ENCOUNTER — Ambulatory Visit: Payer: BLUE CROSS/BLUE SHIELD | Admitting: Nurse Practitioner

## 2015-01-28 ENCOUNTER — Encounter: Payer: Self-pay | Admitting: Nurse Practitioner

## 2015-02-11 ENCOUNTER — Ambulatory Visit: Payer: BLUE CROSS/BLUE SHIELD | Admitting: Cardiology

## 2015-02-11 NOTE — Progress Notes (Unsigned)
Patient ID: ADRIC WREDE, male   DOB: 08/27/1965, 49 y.o.   MRN: [id number]     Clinical Summary Mr. Trim is a 49 y.o.male seen today for follow up of the following medical problems. This is a focused visit on his history of CAD and recent chest pain  1. CAD  - prior PCI to RCA in [place], [place] in setting of inferior STEMI. Noted distal LM disease of 50% at that time.  - continued to have symptoms, seen at [facility]. Adensoine MRI showed small RCA infarct with no current ischemic defects.  - 02/2012 echo 50-55% - Jan 2014 cath at [facility] with PCI to RCA with DES, left main disease described as 40% - Jan 2015 MPI without ischemia  - reports recent episodes of chest pain. Mainly with anxiety. Can get pressure in midchest 7-8/10. Often better with SL NG - he stopped his imdur, states his symptoms seemed to be worst with taking daily.   - since last visit had Lexiscan 12/2014 low risk study without significant ischemia.   2. Hyperlipidemia  - compliant with high dose statin  3. HTN  - Typically around 120s/70-80s at home by his report - compliant with meds, however has not taken meds yet today  4. Tobacco - electronic cigs and patches did not work. Chantix has not worked before.  - currently not interested in quitting Past Medical History  Diagnosis Date  . Hypertension   . Carpal tunnel syndrome   . GERD (gastroesophageal reflux disease)   . Seasonal allergies   . History of ETOH abuse     STOPPED 9 MONTHS AGO  . Hyperlipidemia   . MI (myocardial infarction) 02-2012  . CAD (coronary artery disease)   . Coronary atherosclerosis of artery bypass graft      Allergies  Allergen Reactions  . Bee Venom      Current Outpatient Prescriptions  Medication Sig Dispense Refill  . aspirin EC 81 MG tablet Take 81 mg by mouth every morning.    Marland Kitchen atorvastatin (LIPITOR) 80 MG tablet Take 80 mg by mouth daily.    Marland Kitchen buPROPion (WELLBUTRIN) 75 MG tablet Take 150 mg by mouth 2 (two)  times daily.    . Cholecalciferol (VITAMIN D-3) 1000 UNITS CAPS Take 2,000 Units by mouth daily.     . hydrochlorothiazide (HYDRODIURIL) 25 MG tablet Take 25 mg by mouth daily.    Marland Kitchen HYDROcodone-acetaminophen (NORCO/VICODIN) 5-325 MG per tablet Take one-two tabs po q 4-6 hrs prn pain 20 tablet 0  . lisinopril (PRINIVIL,ZESTRIL) 40 MG tablet Take 40 mg by mouth daily.    . metoprolol succinate (TOPROL-XL) 50 MG 24 hr tablet Take 1 tablet (50 mg total) by mouth daily. 30 tablet 6  . naproxen (NAPROSYN) 500 MG tablet Take 500 mg by mouth 2 (two) times daily with a meal.    . nitroGLYCERIN (NITROSTAT) 0.4 MG SL tablet Place 1 tablet (0.4 mg total) under the tongue every 5 (five) minutes as needed for chest pain. 25 tablet 3  . omeprazole (PRILOSEC) 20 MG capsule Take 20 mg by mouth daily.     No current facility-administered medications for this visit.     Past Surgical History  Procedure Laterality Date  . Coronary artery catheterization with stenting N/A 02-2102 AND 05-2012     Allergies  Allergen Reactions  . Bee Venom       Family History  Problem Relation Age of Onset  . Hypertension Mother   . Heart disease  Mother   . Cirrhosis Father     DIED AT AGE 6  . Breast cancer Paternal Grandmother     DECEASED     Social History Mr. Bracco reports that he has been smoking Cigarettes.  He started smoking about 37 years ago. He has a 30 pack-year smoking history. He has never used smokeless tobacco. Mr. Shostak reports that he drinks alcohol.   Review of Systems CONSTITUTIONAL: No weight loss, fever, chills, weakness or fatigue.  HEENT: Eyes: No visual loss, blurred vision, double vision or yellow sclerae.No hearing loss, sneezing, congestion, runny nose or sore throat.  SKIN: No rash or itching.  CARDIOVASCULAR:  RESPIRATORY: No shortness of breath, cough or sputum.  GASTROINTESTINAL: No anorexia, nausea, vomiting or diarrhea. No abdominal pain or blood.  GENITOURINARY: No  burning on urination, no polyuria NEUROLOGICAL: No headache, dizziness, syncope, paralysis, ataxia, numbness or tingling in the extremities. No change in bowel or bladder control.  MUSCULOSKELETAL: No muscle, back pain, joint pain or stiffness.  LYMPHATICS: No enlarged nodes. No history of splenectomy.  PSYCHIATRIC: No history of depression or anxiety.  ENDOCRINOLOGIC: No reports of sweating, cold or heat intolerance. No polyuria or polydipsia.  Marland Kitchen   Physical Examination There were no vitals filed for this visit. There were no vitals filed for this visit.  Gen: resting comfortably, no acute distress HEENT: no scleral icterus, pupils equal round and reactive, no palptable cervical adenopathy,  CV Resp: Clear to auscultation bilaterally GI: abdomen is soft, non-tender, non-distended, normal bowel sounds, no hepatosplenomegaly MSK: extremities are warm, no edema.  Skin: warm, no rash Neuro:  no focal deficits Psych: appropriate affect   Diagnostic Studies  05/2013 MPI Analysis of the raw data showed no increased extracardiac radiotracer uptake. Analysis of the perfusion images showed a small mild fixed mid inferior wall defect. Left ventricular cavity size was normal. Regional wall motion was normal, indicating that the defect was due to soft tissue attenuation artifact. Left ventricular systolic function was calculated to be mildly reduced, EF 42%. However, systolic function appeared to be normal with an approximated EF of 50%.  IMPRESSION: 1. Normal myocardial perfusion imaging. No evidence of ischemia or scar.  2. Left ventricular systolic function was calculated to be mildly reduced, EF 42%. However, systolic function appeared to be grossly normal with an approximated EF of 50%.   05/2012 Cath at [facility] Cardiac Catheterization Results: Coronary arteries: Left main: 40% distal Left anterior descending: 30% ostial, scattered irregs Left circumflex: 40% mid,  scattered irregs Right coronary: widely patent proximal stent, 70% mid, 40% at angle, diffuse irregs FFR of left main into LAD: 0.81  PCI Percutaneous coronary intervention performed of the mid RCA lesion which was stented with a 3.0 mm x 65mm Promus DES Results: 70% to normal  INTERVENTIONAL IMPRESSIONS and RECOMMENDATIONS: Successful PCI of the mid RCA lesion Aspirin 81 mg daily indefinitely Ticagrelor (Brilinta) 90 mg PO BID for at least 12 months Risk factor modification, smoking cessation  12/2014 Lexiscan MPI  This is a low risk study.  The left ventricular ejection fraction is mildly decreased (45-54%).  Small mild inferoseptal defect with mild reversibility. Differences in attenuation verse small area of ischemia, overall low risk finding.   Assessment and Plan   1. CAD - prior interventions on RCA x 2, most recent at [facility] with DES placement. Of note he also has 40-50% left main disease - reports recent increase in chest pains, primarily with feeling anxious. Over  a year since his last stress test, given his moderate LM disease we will repeat stress test to further risk stratify. Obtain exercise cardiolite.  - increase Toprol XL to 50mg  daily for additional antianginal therapy   2. Hyperlipidemia  - continue high dose statin in setting of known CAD   3. HTN  - elevated in clinic, however has not taken his medications yet today - continue to follow blood pressures.   4. Tobacco abuse - counseled extensively on health risk, especially given his young age and aggressive coronary disease     Arnoldo Lenis, M.D., F.A.C.C.

## 2015-02-19 ENCOUNTER — Ambulatory Visit: Payer: BLUE CROSS/BLUE SHIELD | Admitting: Nurse Practitioner

## 2015-02-19 ENCOUNTER — Telehealth: Payer: Self-pay | Admitting: Nurse Practitioner

## 2015-02-19 ENCOUNTER — Encounter: Payer: Self-pay | Admitting: Nurse Practitioner

## 2015-02-19 NOTE — Telephone Encounter (Signed)
Noted  

## 2015-02-19 NOTE — Telephone Encounter (Signed)
PATIENT WAS A NO SHOW AND LETTER SENT  °

## 2015-02-20 ENCOUNTER — Ambulatory Visit: Payer: BLUE CROSS/BLUE SHIELD | Admitting: Urology

## 2015-03-09 ENCOUNTER — Encounter: Payer: Self-pay | Admitting: Nurse Practitioner

## 2015-03-09 ENCOUNTER — Other Ambulatory Visit: Payer: Self-pay

## 2015-03-09 ENCOUNTER — Ambulatory Visit (INDEPENDENT_AMBULATORY_CARE_PROVIDER_SITE_OTHER): Payer: BLUE CROSS/BLUE SHIELD | Admitting: Nurse Practitioner

## 2015-03-09 VITALS — BP 152/93 | HR 67 | Temp 98.3°F | Ht 71.0 in | Wt 248.0 lb

## 2015-03-09 DIAGNOSIS — Z1211 Encounter for screening for malignant neoplasm of colon: Secondary | ICD-10-CM | POA: Insufficient documentation

## 2015-03-09 MED ORDER — PEG 3350-KCL-NA BICARB-NACL 420 G PO SOLR: 4000.0000 mL | Freq: Once | ORAL | Status: DC

## 2015-03-09 NOTE — Patient Instructions (Signed)
1. We will help schedule your procedure for you. 2. Return for follow-up for any GI symptoms including stomach and colon problems. 3. Further recommendations to be based on results of your procedure.

## 2015-03-09 NOTE — Assessment & Plan Note (Signed)
49 year old African-American male presents for his first-ever screening colonoscopy. Generally asymptomatic from a GI standpoint. We'll plan for the procedure and the OR on propofol due to daily alcohol use, chronic pain medications, and antidepressant. No red flag/warning signs or symptoms.  Proceed with TCS in the OR with propofol with Dr. Gala Romney in near future: the risks, benefits, and alternatives have been discussed with the patient in detail. The patient states understanding and desires to proceed.  The patient is not on any anticoagulants. He does take Norco daily for chronic pain, and Wellbutrin daily as well. He drinks 5-6 beers a day. No anxiolytics. Denies recreational drug use. We will plan for the procedure and the OR on propofol/MAC to promote adequate sedation.

## 2015-03-09 NOTE — Progress Notes (Signed)
cc'ed to pcp °

## 2015-03-09 NOTE — Progress Notes (Signed)
Primary Care Physician:  [name], NP Primary Gastroenterologist:  Dr. Gala Romney  Chief Complaint  Patient presents with  . OTHER    schedule colonoscopy    HPI:   49 year old male presents for first-ever screening colonoscopy. Referred by PCP, PCP notes reviewed. Last primary care visit 07/30/2014 for complete physical. No GI symptoms per PCP notes. Patient was attempted phone triage, however with a history of alcohol abuse was referred for office visit prior to procedure. No colonoscopy records found in our system.  Today he states he has never had a colonoscopy. Denies abdominal pain, N/V, change in bowel habits, fever, chills, hematochezia, melena. Has a bowel movement about once a day, and varies [place] 1-4. Occasional straining. Has occasional chest pains, currently sees cardiology, has a history of MI. Recent Myoview with results showing no new blockages, low risk study, EF 45-54%. Denies dyspnea, dizziness, lightheadedness, syncope, near syncope. Denies any other upper or lower GI symptoms.  Past Medical History  Diagnosis Date  . Hypertension   . Carpal tunnel syndrome   . GERD (gastroesophageal reflux disease)   . Seasonal allergies   . History of ETOH abuse     STOPPED 9 MONTHS AGO  . Hyperlipidemia   . MI (myocardial infarction) ([place]) 02-2012  . CAD (coronary artery disease)   . Coronary atherosclerosis of artery bypass graft     Past Surgical History  Procedure Laterality Date  . Coronary artery catheterization with stenting N/A 02-2102 AND 05-2012    Current Outpatient Prescriptions  Medication Sig Dispense Refill  . aspirin EC 81 MG tablet Take 81 mg by mouth every morning.    Marland Kitchen atorvastatin (LIPITOR) 80 MG tablet Take 80 mg by mouth daily.    Marland Kitchen buPROPion (WELLBUTRIN) 75 MG tablet Take 150 mg by mouth 2 (two) times daily.    . Cholecalciferol (VITAMIN D-3) 1000 UNITS CAPS Take 2,000 Units by mouth daily.     . hydrochlorothiazide (HYDRODIURIL) 25 MG  tablet Take 25 mg by mouth daily.    Marland Kitchen HYDROcodone-acetaminophen (NORCO/VICODIN) 5-325 MG per tablet Take one-two tabs po q 4-6 hrs prn pain 20 tablet 0  . lisinopril (PRINIVIL,ZESTRIL) 40 MG tablet Take 40 mg by mouth daily.    . metoprolol succinate (TOPROL-XL) 50 MG 24 hr tablet Take 1 tablet (50 mg total) by mouth daily. 30 tablet 6  . naproxen (NAPROSYN) 500 MG tablet Take 500 mg by mouth 2 (two) times daily with a meal.    . omeprazole (PRILOSEC) 20 MG capsule Take 20 mg by mouth daily.    . tamsulosin (FLOMAX) 0.4 MG CAPS capsule Take 0.4 mg by mouth.    . nitroGLYCERIN (NITROSTAT) 0.4 MG SL tablet Place 1 tablet (0.4 mg total) under the tongue every 5 (five) minutes as needed for chest pain. (Patient not taking: Reported on 03/09/2015) 25 tablet 3   No current facility-administered medications for this visit.    Allergies as of 03/09/2015 - Review Complete 03/09/2015  Allergen Reaction Noted  . Bee venom  06/12/2013    Family History  Problem Relation Age of Onset  . Hypertension Mother   . Heart disease Mother   . Cirrhosis Father     DIED AT AGE 87  . Breast cancer Paternal Grandmother     DECEASED    Social History   Social History  . Marital Status: Legally Separated    Spouse Name: N/A  . Number of Children: N/A  . Years of Education:  N/A   Occupational History  . Not on file.   Social History Main Topics  . Smoking status: Current Every Day Smoker -- 1.00 packs/day for 30 years    Types: Cigarettes    Start date: 11/22/1977  . Smokeless tobacco: Never Used     Comment: one pack daily  . Alcohol Use: 0.0 oz/week    0 Standard drinks or equivalent per week     Comment: SOCIALLY /quit drinking  . Drug Use: No  . Sexual Activity: Not on file   Other Topics Concern  . Not on file   Social History Narrative    Review of Systems: General: Negative for anorexia, weight loss, fever, chills, fatigue, weakness. Eyes: Negative for vision changes.  ENT:  Negative for hoarseness, difficulty swallowing. CV: Negative for palpitations, peripheral edema.  Respiratory: Negative for dyspnea at rest, cough, sputum, wheezing.  GI: See history of present illness. Derm: Negative for rash or itching.  Endo: Negative for unusual weight change.  Heme: Negative for bruising or bleeding. Allergy: Negative for rash or hives.    Physical Exam: BP 152/93 mmHg  Pulse 67  Temp(Src) 98.3 F (36.8 C) (Oral)  Ht 5\' 11"  (1.803 m)  Wt 248 lb (112.492 kg)  BMI 34.60 kg/m2 General:   Alert and oriented. Pleasant and cooperative. Well-nourished and well-developed.  Head:  Normocephalic and atraumatic. Eyes:  Without icterus, sclera clear and conjunctiva pink.  Ears:  Normal auditory acuity. Cardiovascular:  S1, S2 present without murmurs appreciated. Extremities without clubbing or edema. Respiratory:  Clear to auscultation bilaterally. No wheezes, rales, or rhonchi. No distress.  Gastrointestinal:  +BS, rounded, soft, non-tender and non-distended. No HSM noted. No guarding or rebound. No masses appreciated.  Rectal:  Deferred  Neurologic:  Alert and oriented x4;  grossly normal neurologically. Psych:  Alert and cooperative. Normal mood and affect. Heme/Lymph/Immune: No excessive bruising noted.    03/09/2015 8:21 AM

## 2015-03-16 NOTE — Patient Instructions (Signed)
   Your procedure is scheduled on: 03/23/2015  Report to [facility] at   7:00  AM.  Call this number if you have problems the morning of surgery: [phone]   Remember:   Do not drink or eat food:After Midnight.  :  Take these medicines the morning of surgery with A SIP OF WATER: Bupropion, Lisinopril,Metoprolol and Omeprazole   Do not wear jewelry, make-up or nail polish.  Do not wear lotions, powders, or perfumes. You may wear deodorant.  Do not shave 48 hours prior to surgery. Men may shave face and neck.  Do not bring valuables to the hospital.  Contacts, dentures or bridgework may not be worn into surgery.  Leave suitcase in the car. After surgery it may be brought to your room.  For patients admitted to the hospital, checkout time is 11:00 AM the day of discharge.   Patients discharged the day of surgery will not be allowed to drive home.    Colonoscopy, Care After Refer to this sheet in the next few weeks. These instructions provide you with information on caring for yourself after your procedure. Your health care provider may also give you more specific instructions. Your treatment has been planned according to current medical practices, but problems sometimes occur. Call your health care provider if you have any problems or questions after your procedure. WHAT TO EXPECT AFTER THE PROCEDURE  After your procedure, it is typical to have the following:  A small amount of blood in your stool.  Moderate amounts of gas and mild abdominal cramping or bloating. HOME CARE INSTRUCTIONS  Do not drive, operate machinery, or sign important documents for 24 hours.  You may shower and resume your regular physical activities, but move at a slower pace for the first 24 hours.  Take frequent rest periods for the first 24 hours.  Walk around or put a warm pack on your abdomen to help reduce abdominal cramping and bloating.  Drink enough fluids to keep your urine clear or pale  yellow.  You may resume your normal diet as instructed by your health care provider. Avoid heavy or fried foods that are hard to digest.  Avoid drinking alcohol for 24 hours or as instructed by your health care provider.  Only take over-the-counter or prescription medicines as directed by your health care provider.  If a tissue sample (biopsy) was taken during your procedure:  Do not take aspirin or blood thinners for 7 days, or as instructed by your health care provider.  Do not drink alcohol for 7 days, or as instructed by your health care provider.  Eat soft foods for the first 24 hours. SEEK MEDICAL CARE IF: You have persistent spotting of blood in your stool 2-3 days after the procedure. SEEK IMMEDIATE MEDICAL CARE IF:  You have more than a small spotting of blood in your stool.  You pass large blood clots in your stool.  Your abdomen is swollen (distended).  You have nausea or vomiting.  You have a fever.  You have increasing abdominal pain that is not relieved with medicine.   This information is not intended to replace advice given to you by your health care provider. Make sure you discuss any questions you have with your health care provider.   Document Released: 12/08/2003 Document Revised: 02/13/2013 Document Reviewed: 12/31/2012 Elsevier Interactive Patient Education [employer].

## 2015-03-17 ENCOUNTER — Encounter (HOSPITAL_COMMUNITY)
Admission: RE | Admit: 2015-03-17 | Discharge: 2015-03-17 | Disposition: A | Discharge status: Home / Self Care | Payer: BLUE CROSS/BLUE SHIELD | Source: Ambulatory Visit | Attending: Internal Medicine | Admitting: Internal Medicine

## 2015-03-17 ENCOUNTER — Encounter (HOSPITAL_COMMUNITY): Payer: Self-pay

## 2015-03-17 DIAGNOSIS — Z01818 Encounter for other preprocedural examination: Secondary | ICD-10-CM | POA: Diagnosis not present

## 2015-03-17 LAB — BASIC METABOLIC PANEL
ANION GAP: 9 (ref 5–15)
BUN: 16 mg/dL (ref 6–20)
CALCIUM: 9.5 mg/dL (ref 8.9–10.3)
CHLORIDE: 104 mmol/L (ref 101–111)
CO2: 26 mmol/L (ref 22–32)
Creatinine, Ser: 1.24 mg/dL (ref 0.61–1.24)
GFR calc non Af Amer: >60 mL/min (ref >60)
Glucose, Bld: 89 mg/dL (ref 65–99)
Potassium: 4.6 mmol/L (ref 3.5–5.1)
Sodium: 139 mmol/L (ref 135–145)

## 2015-03-17 LAB — CBC
HEMATOCRIT: 43.4 % (ref 39.0–52.0)
HEMOGLOBIN: 14.6 g/dL (ref 13.0–17.0)
MCH: 31.2 pg (ref 26.0–34.0)
MCHC: 33.6 g/dL (ref 30.0–36.0)
MCV: 92.7 fL (ref 78.0–100.0)
Platelets: 263 10*3/uL (ref 150–400)
RBC: 4.68 MIL/uL (ref 4.22–5.81)
RDW: 14.3 % (ref 11.5–15.5)
WBC: 8.5 10*3/uL (ref 4.0–10.5)

## 2015-03-23 ENCOUNTER — Ambulatory Visit (HOSPITAL_COMMUNITY): Payer: BLUE CROSS/BLUE SHIELD | Admitting: Anesthesiology

## 2015-03-23 ENCOUNTER — Ambulatory Visit (HOSPITAL_COMMUNITY)
Admission: RE | Admit: 2015-03-23 | Discharge: 2015-03-23 | Disposition: A | Discharge status: Home / Self Care | Payer: BLUE CROSS/BLUE SHIELD | Source: Ambulatory Visit | Attending: Internal Medicine | Admitting: Internal Medicine

## 2015-03-23 ENCOUNTER — Encounter (HOSPITAL_COMMUNITY): Payer: Self-pay | Admitting: *Deleted

## 2015-03-23 ENCOUNTER — Encounter (HOSPITAL_COMMUNITY)
Admission: RE | Disposition: A | Discharge status: Home / Self Care | Payer: Self-pay | Source: Ambulatory Visit | Attending: Internal Medicine

## 2015-03-23 DIAGNOSIS — E785 Hyperlipidemia, unspecified: Secondary | ICD-10-CM | POA: Diagnosis not present

## 2015-03-23 DIAGNOSIS — Z1211 Encounter for screening for malignant neoplasm of colon: Secondary | ICD-10-CM | POA: Diagnosis not present

## 2015-03-23 DIAGNOSIS — I25810 Atherosclerosis of coronary artery bypass graft(s) without angina pectoris: Secondary | ICD-10-CM | POA: Insufficient documentation

## 2015-03-23 DIAGNOSIS — D120 Benign neoplasm of cecum: Secondary | ICD-10-CM | POA: Diagnosis not present

## 2015-03-23 DIAGNOSIS — K219 Gastro-esophageal reflux disease without esophagitis: Secondary | ICD-10-CM | POA: Insufficient documentation

## 2015-03-23 DIAGNOSIS — K635 Polyp of colon: Secondary | ICD-10-CM | POA: Diagnosis not present

## 2015-03-23 DIAGNOSIS — Z955 Presence of coronary angioplasty implant and graft: Secondary | ICD-10-CM | POA: Diagnosis not present

## 2015-03-23 DIAGNOSIS — Z860100 Personal history of colon polyps, unspecified: Secondary | ICD-10-CM | POA: Insufficient documentation

## 2015-03-23 DIAGNOSIS — I10 Essential (primary) hypertension: Secondary | ICD-10-CM | POA: Diagnosis not present

## 2015-03-23 DIAGNOSIS — Z791 Long term (current) use of non-steroidal anti-inflammatories (NSAID): Secondary | ICD-10-CM | POA: Diagnosis not present

## 2015-03-23 DIAGNOSIS — K5730 Diverticulosis of large intestine without perforation or abscess without bleeding: Secondary | ICD-10-CM | POA: Insufficient documentation

## 2015-03-23 DIAGNOSIS — Z79899 Other long term (current) drug therapy: Secondary | ICD-10-CM | POA: Insufficient documentation

## 2015-03-23 DIAGNOSIS — I2510 Atherosclerotic heart disease of native coronary artery without angina pectoris: Secondary | ICD-10-CM | POA: Insufficient documentation

## 2015-03-23 DIAGNOSIS — I252 Old myocardial infarction: Secondary | ICD-10-CM | POA: Diagnosis not present

## 2015-03-23 DIAGNOSIS — Z7982 Long term (current) use of aspirin: Secondary | ICD-10-CM | POA: Insufficient documentation

## 2015-03-23 HISTORY — PX: POLYPECTOMY: SHX5525

## 2015-03-23 HISTORY — PX: COLONOSCOPY WITH PROPOFOL: SHX5780

## 2015-03-23 SURGERY — COLONOSCOPY WITH PROPOFOL
Anesthesia: Monitor Anesthesia Care

## 2015-03-23 MED ORDER — PROPOFOL 10 MG/ML IV BOLUS
INTRAVENOUS | Status: AC
  Filled 2015-03-23: qty 20

## 2015-03-23 MED ORDER — MIDAZOLAM HCL 2 MG/2ML IJ SOLN
INTRAMUSCULAR | Status: AC
  Filled 2015-03-23: qty 4

## 2015-03-23 MED ORDER — MIDAZOLAM HCL 2 MG/2ML IJ SOLN
INTRAMUSCULAR | Status: AC
  Filled 2015-03-23: qty 2

## 2015-03-23 MED ORDER — PROPOFOL 500 MG/50ML IV EMUL
INTRAVENOUS | Status: DC | PRN
  Administered 2015-03-23: 125 ug/kg/min via INTRAVENOUS

## 2015-03-23 MED ORDER — FENTANYL CITRATE (PF) 100 MCG/2ML IJ SOLN: 25.0000 ug | INTRAMUSCULAR | Status: DC | PRN

## 2015-03-23 MED ORDER — ONDANSETRON HCL 4 MG/2ML IJ SOLN: 4.0000 mg | Freq: Once | INTRAMUSCULAR | Status: DC | PRN

## 2015-03-23 MED ORDER — MIDAZOLAM HCL 5 MG/5ML IJ SOLN
INTRAMUSCULAR | Status: DC | PRN
  Administered 2015-03-23: 2 mg via INTRAVENOUS

## 2015-03-23 MED ORDER — LACTATED RINGERS IV SOLN
INTRAVENOUS | Status: DC
  Administered 2015-03-23: 75 mL/h via INTRAVENOUS

## 2015-03-23 MED ORDER — STERILE WATER FOR IRRIGATION IR SOLN
Status: DC | PRN
  Administered 2015-03-23: 1000 mL

## 2015-03-23 MED ORDER — MIDAZOLAM HCL 2 MG/2ML IJ SOLN
1.0000 mg | INTRAMUSCULAR | Status: DC | PRN
  Administered 2015-03-23 (×2): 1 mg via INTRAVENOUS

## 2015-03-23 SURGICAL SUPPLY — 23 items
ELECT REM PT RETURN 9FT ADLT (ELECTROSURGICAL)
ELECTRODE REM PT RTRN 9FT ADLT (ELECTROSURGICAL) IMPLANT
FCP BXJMBJMB 240X2.8X (CUTTING FORCEPS)
FLOOR PAD 36X40 (MISCELLANEOUS) ×3
FORCEPS BIOP RAD 4 LRG CAP 4 (CUTTING FORCEPS) ×3 IMPLANT
FORCEPS BIOP RJ4 240 W/NDL (CUTTING FORCEPS)
FORCEPS BXJMBJMB 240X2.8X (CUTTING FORCEPS) IMPLANT
FORMALIN 10 PREFIL 20ML (MISCELLANEOUS) ×6 IMPLANT
INJECTOR/SNARE I SNARE (MISCELLANEOUS) IMPLANT
KIT ENDO PROCEDURE PEN (KITS) ×3 IMPLANT
MANIFOLD NEPTUNE II (INSTRUMENTS) ×3 IMPLANT
NEEDLE SCLEROTHERAPY 25GX240 (NEEDLE) IMPLANT
PAD FLOOR 36X40 (MISCELLANEOUS) ×1 IMPLANT
PROBE APC STR FIRE (PROBE) IMPLANT
PROBE INJECTION GOLD (MISCELLANEOUS)
PROBE INJECTION GOLD 7FR (MISCELLANEOUS) IMPLANT
SNARE ROTATE MED OVAL 20MM (MISCELLANEOUS) ×3 IMPLANT
SNARE SHORT THROW 13M SML OVAL (MISCELLANEOUS) IMPLANT
SYR 50ML LL SCALE MARK (SYRINGE) ×3 IMPLANT
TRAP SPECIMEN MUCOUS 40CC (MISCELLANEOUS) IMPLANT
TUBING INSUFFLATOR CO2MPACT (TUBING) ×3 IMPLANT
TUBING IRRIGATION ENDOGATOR (MISCELLANEOUS) ×3 IMPLANT
WATER STERILE IRR 1000ML POUR (IV SOLUTION) ×3 IMPLANT

## 2015-03-23 NOTE — Anesthesia Postprocedure Evaluation (Signed)
  Anesthesia Post-op Note  Patient: Larry Pham  Procedure(s) Performed: Procedure(s) with comments: COLONOSCOPY WITH PROPOFOL (N/A) - Cecum time in 0849  time out  0959  total time 10 mintues POLYPECTOMY (N/A) - cecal, descending colon  Patient Location: PACU  Anesthesia Type:MAC  Level of Consciousness: awake, alert  and oriented  Airway and Oxygen Therapy: Patient Spontanous Breathing  Post-op Pain: none  Post-op Assessment: Post-op Vital signs reviewed, Patient's Cardiovascular Status Stable, Respiratory Function Stable, Patent Airway and No signs of Nausea or vomiting              Post-op Vital Signs: Reviewed and stable  Last Vitals:  Filed Vitals:   03/23/15 0832  BP: 119/75  Pulse: 76  Temp:   Resp: 18    Complications: No apparent anesthesia complications

## 2015-03-23 NOTE — Anesthesia Preprocedure Evaluation (Signed)
Anesthesia Evaluation  Patient identified by MRN, date of birth, ID band Patient awake    Reviewed: Allergy & Precautions, NPO status , Patient's Chart, lab work & pertinent test results, reviewed documented beta blocker date and time   Airway Mallampati: III  TM Distance: >3 FB     Dental  (+) Teeth Intact, Missing, Dental Advisory Given,    Pulmonary Current Smoker,    Pulmonary exam normal        Cardiovascular hypertension, Pt. on medications and Pt. on home beta blockers + CAD, + Past MI and + Cardiac Stents  Normal cardiovascular exam     Neuro/Psych    GI/Hepatic GERD  Medicated and Controlled,  Endo/Other    Renal/GU      Musculoskeletal   Abdominal Normal abdominal exam  (+)   Peds  Hematology   Anesthesia Other Findings   Reproductive/Obstetrics                             Anesthesia Physical Anesthesia Plan  ASA: III  Anesthesia Plan: MAC   Post-op Pain Management:    Induction: Intravenous  Airway Management Planned: Nasal Cannula  Additional Equipment:   Intra-op Plan:   Post-operative Plan:   Informed Consent: I have reviewed the patients History and Physical, chart, labs and discussed the procedure including the risks, benefits and alternatives for the proposed anesthesia with the patient or authorized representative who has indicated his/her understanding and acceptance.   Dental advisory given  Plan Discussed with:   Anesthesia Plan Comments:         Anesthesia Quick Evaluation

## 2015-03-23 NOTE — Op Note (Addendum)
[facility] [address] [place], [address]   COLONOSCOPY PROCEDURE REPORT  PATIENT: Larry Pham, Larry Pham  MR#: [id number] BIRTHDATE: [birth year] , 49  yrs. old GENDER: male ENDOSCOPIST: R.  Garfield Cornea, MD FACP [facility] REFERRED [id number] Steward Ros, NP PROCEDURE DATE:  Apr 05, 2015 PROCEDURE:   Colonoscopy with biopsy and snare polypectomy INDICATIONS:Average risk colorectal cancer screening examination. MEDICATIONS: Deep sedation per Dr.  [facility] ASA CLASS:       Class II  CONSENT: The risks, benefits, alternatives and imponderables including but not limited to bleeding, perforation as well as the possibility of a missed lesion have been reviewed.  The potential for biopsy, lesion removal, etc. have also been discussed. Questions have been answered.  All parties agreeable.  Please see the history and physical in the medical record for more information.  DESCRIPTION OF PROCEDURE:   After the risks benefits and alternatives of the procedure were thoroughly explained, informed consent was obtained.  The digital rectal exam revealed no abnormalities of the rectum.   The     endoscope was introduced through the anus and advanced to the cecum, which was identified by both the appendix and ileocecal valve. No adverse events experienced.   The quality of the prep was adequate  The instrument was then slowly withdrawn as the colon was fully examined. Estimated blood loss is zero unless otherwise noted in this procedure report.      COLON FINDINGS: Internal hemorrhoids; otherwise, normal-appearing rectal mucosa.  Scattered pancolonic diverticula; (1) 5 mm polyp in the mid descending segment and a single diminutive polyp in the base the cecum; otherwise, the remainder of the colonic gas appear normal.  Retroflexion was performed. .  The above-mentioned polyps were cold biopsy and cold snare removed, respectively.  Withdrawal time=10 minutes 0 seconds.   The scope was withdrawn and the procedure completed. COMPLICATIONS: There were no immediate complications. EBL 5 mL ENDOSCOPIC IMPRESSION: Multiple colonic polyps?"removed as described above. Colonic diverticulosis.  RECOMMENDATIONS: Follow up on pathology.  eSigned:  R. Garfield Cornea, MD Rosalita Chessman [facility] 04/05/2015 9:06 AM   cc:  CPT CODES: ICD CODES:  The ICD and CPT codes recommended by this software are interpretations from the data that the clinical staff has captured with the software.  The verification of the translation of this report to the ICD and CPT codes and modifiers is the sole responsibility of the health care institution and practicing physician where this report was generated.  [place]. will not be held responsible for the validity of the ICD and CPT codes included on this report.  AMA assumes no liability for data contained or not contained herein. CPT is a Designer, television/film set of the [employer].  PATIENT NAME:  Larry Pham, Larry Pham MR#: [id number]

## 2015-03-23 NOTE — Interval H&P Note (Signed)
History and Physical Interval Note:  03/23/2015 8:24 AM  Larry Pham  has presented today for surgery, with the diagnosis of screening colonoscopy  The various methods of treatment have been discussed with the patient and family. After consideration of risks, benefits and other options for treatment, the patient has consented to  Procedure(s) with comments: COLONOSCOPY WITH PROPOFOL (N/A) - 0815 as a surgical intervention .  The patient's history has been reviewed, patient examined, no change in status, stable for surgery.  I have reviewed the patient's chart and labs.  Questions were answered to the patient's satisfaction.     Larry Pham   No change. Screening colonoscopy per plan.  The risks, benefits, limitations, alternatives and imponderables have been reviewed with the patient. Questions have been answered. All parties are agreeable.

## 2015-03-23 NOTE — H&P (View-Only) (Signed)
  Primary Care Physician:  Strader, Lindsey F, NP Primary Gastroenterologist:  Dr. Rourk  Chief Complaint  Patient presents with  . OTHER    schedule colonoscopy    HPI:   49-year-old male presents for first-ever screening colonoscopy. Referred by PCP, PCP notes reviewed. Last primary care visit 07/30/2014 for complete physical. No GI symptoms per PCP notes. Patient was attempted phone triage, however with a history of alcohol abuse was referred for office visit prior to procedure. No colonoscopy records found in our system.  Today he states he has never had a colonoscopy. Denies abdominal pain, N/V, change in bowel habits, fever, chills, hematochezia, melena. Has a bowel movement about once a day, and varies [place] 1-4. Occasional straining. Has occasional chest pains, currently sees cardiology, has a history of MI. Recent Myoview with results showing no new blockages, low risk study, EF 45-54%. Denies dyspnea, dizziness, lightheadedness, syncope, near syncope. Denies any other upper or lower GI symptoms.  Past Medical History  Diagnosis Date  . Hypertension   . Carpal tunnel syndrome   . GERD (gastroesophageal reflux disease)   . Seasonal allergies   . History of ETOH abuse     STOPPED 9 MONTHS AGO  . Hyperlipidemia   . MI (myocardial infarction) ([facility]) 02-2012  . CAD (coronary artery disease)   . Coronary atherosclerosis of artery bypass graft     Past Surgical History  Procedure Laterality Date  . Coronary artery catheterization with stenting N/A 02-2102 AND 05-2012    Current Outpatient Prescriptions  Medication Sig Dispense Refill  . aspirin EC 81 MG tablet Take 81 mg by mouth every morning.    . atorvastatin (LIPITOR) 80 MG tablet Take 80 mg by mouth daily.    . buPROPion (WELLBUTRIN) 75 MG tablet Take 150 mg by mouth 2 (two) times daily.    . Cholecalciferol (VITAMIN D-3) 1000 UNITS CAPS Take 2,000 Units by mouth daily.     . hydrochlorothiazide (HYDRODIURIL) 25 MG  tablet Take 25 mg by mouth daily.    . HYDROcodone-acetaminophen (NORCO/VICODIN) 5-325 MG per tablet Take one-two tabs po q 4-6 hrs prn pain 20 tablet 0  . lisinopril (PRINIVIL,ZESTRIL) 40 MG tablet Take 40 mg by mouth daily.    . metoprolol succinate (TOPROL-XL) 50 MG 24 hr tablet Take 1 tablet (50 mg total) by mouth daily. 30 tablet 6  . naproxen (NAPROSYN) 500 MG tablet Take 500 mg by mouth 2 (two) times daily with a meal.    . omeprazole (PRILOSEC) 20 MG capsule Take 20 mg by mouth daily.    . tamsulosin (FLOMAX) 0.4 MG CAPS capsule Take 0.4 mg by mouth.    . nitroGLYCERIN (NITROSTAT) 0.4 MG SL tablet Place 1 tablet (0.4 mg total) under the tongue every 5 (five) minutes as needed for chest pain. (Patient not taking: Reported on 03/09/2015) 25 tablet 3   No current facility-administered medications for this visit.    Allergies as of 03/09/2015 - Review Complete 03/09/2015  Allergen Reaction Noted  . Bee venom  06/12/2013    Family History  Problem Relation Age of Onset  . Hypertension Mother   . Heart disease Mother   . Cirrhosis Father     DIED AT AGE 34  . Breast cancer Paternal Grandmother     DECEASED    Social History   Social History  . Marital Status: Legally Separated    Spouse Name: N/A  . Number of Children: N/A  . Years of Education:   N/A   Occupational History  . Not on file.   Social History Main Topics  . Smoking status: Current Every Day Smoker -- 1.00 packs/day for 30 years    Types: Cigarettes    Start date: 11/22/1977  . Smokeless tobacco: Never Used     Comment: one pack daily  . Alcohol Use: 0.0 oz/week    0 Standard drinks or equivalent per week     Comment: SOCIALLY /quit drinking  . Drug Use: No  . Sexual Activity: Not on file   Other Topics Concern  . Not on file   Social History Narrative    Review of Systems: General: Negative for anorexia, weight loss, fever, chills, fatigue, weakness. Eyes: Negative for vision changes.  ENT:  Negative for hoarseness, difficulty swallowing. CV: Negative for palpitations, peripheral edema.  Respiratory: Negative for dyspnea at rest, cough, sputum, wheezing.  GI: See history of present illness. Derm: Negative for rash or itching.  Endo: Negative for unusual weight change.  Heme: Negative for bruising or bleeding. Allergy: Negative for rash or hives.    Physical Exam: BP 152/93 mmHg  Pulse 67  Temp(Src) 98.3 F (36.8 C) (Oral)  Ht 5' 11" (1.803 m)  Wt 248 lb (112.492 kg)  BMI 34.60 kg/m2 General:   Alert and oriented. Pleasant and cooperative. Well-nourished and well-developed.  Head:  Normocephalic and atraumatic. Eyes:  Without icterus, sclera clear and conjunctiva pink.  Ears:  Normal auditory acuity. Cardiovascular:  S1, S2 present without murmurs appreciated. Extremities without clubbing or edema. Respiratory:  Clear to auscultation bilaterally. No wheezes, rales, or rhonchi. No distress.  Gastrointestinal:  +BS, rounded, soft, non-tender and non-distended. No HSM noted. No guarding or rebound. No masses appreciated.  Rectal:  Deferred  Neurologic:  Alert and oriented x4;  grossly normal neurologically. Psych:  Alert and cooperative. Normal mood and affect. Heme/Lymph/Immune: No excessive bruising noted.    03/09/2015 8:21 AM  

## 2015-03-23 NOTE — Transfer of Care (Signed)
Immediate Anesthesia Transfer of Care Note  Patient: Larry Pham  Procedure(s) Performed: Procedure(s) with comments: COLONOSCOPY WITH PROPOFOL (N/A) - Cecum time in 0849  time out  0959  total time 10 mintues POLYPECTOMY (N/A) - cecal, descending colon  Patient Location: PACU  Anesthesia Type:MAC  Level of Consciousness: awake  Airway & Oxygen Therapy: Patient Spontanous Breathing and Patient connected to face mask oxygen  Post-op Assessment: Report given to RN  Post vital signs: Reviewed and stable  Last Vitals:  Filed Vitals:   03/23/15 0832  BP: 119/75  Pulse: 76  Temp:   Resp: 18    Complications: No apparent anesthesia complications

## 2015-03-23 NOTE — Discharge Instructions (Signed)
°Colonoscopy °Discharge Instructions ° °Read the instructions outlined below and refer to this sheet in the next few weeks. These discharge instructions provide you with general information on caring for yourself after you leave the hospital. Your doctor may also give you specific instructions. While your treatment has been planned according to the most current medical practices available, unavoidable complications occasionally occur. If you have any problems or questions after discharge, call Dr. Rourk at [phone]. °ACTIVITY °· You may resume your regular activity, but move at a slower pace for the next 24 hours.  °· Take frequent rest periods for the next 24 hours.  °· Walking will help get rid of the air and reduce the bloated feeling in your belly (abdomen).  °· No driving for 24 hours (because of the medicine (anesthesia) used during the test).   °· Do not sign any important legal documents or operate any machinery for 24 hours (because of the anesthesia used during the test).  °NUTRITION °· Drink plenty of fluids.  °· You may resume your normal diet as instructed by your doctor.  °· Begin with a light meal and progress to your normal diet. Heavy or fried foods are harder to digest and may make you feel sick to your stomach (nauseated).  °· Avoid alcoholic beverages for 24 hours or as instructed.  °MEDICATIONS °· You may resume your normal medications unless your doctor tells you otherwise.  °WHAT YOU CAN EXPECT TODAY °· Some feelings of bloating in the abdomen.  °· Passage of more gas than usual.  °· Spotting of blood in your stool or on the toilet paper.  °IF YOU HAD POLYPS REMOVED DURING THE COLONOSCOPY: °· No aspirin products for 7 days or as instructed.  °· No alcohol for 7 days or as instructed.  °· Eat a soft diet for the next 24 hours.  °FINDING OUT THE RESULTS OF YOUR TEST °Not all test results are available during your visit. If your test results are not back during the visit, make an appointment  with your caregiver to find out the results. Do not assume everything is normal if you have not heard from your caregiver or the medical facility. It is important for you to follow up on all of your test results.  °SEEK IMMEDIATE MEDICAL ATTENTION IF: °· You have more than a spotting of blood in your stool.  °· Your belly is swollen (abdominal distention).  °· You are nauseated or vomiting.  °· You have a temperature over 101.  °· You have abdominal pain or discomfort that is severe or gets worse throughout the day.  ° ° °Colon polyp and diverticulosis information provided ° °Further recommendations to follow pending review of pathology report ° ° °Diverticulosis °Diverticulosis is the condition that develops when small pouches (diverticula) form in the wall of your colon. Your colon, or large intestine, is where water is absorbed and stool is formed. The pouches form when the inside layer of your colon pushes through weak spots in the outer layers of your colon. °CAUSES  °No one knows exactly what causes diverticulosis. °RISK FACTORS °· Being older than 50. Your risk for this condition increases with age. Diverticulosis is rare in people younger than 40 years. By age 80, almost everyone has it. °· Eating a low-fiber diet. °· Being frequently constipated. °· Being overweight. °· Not getting enough exercise. °· Smoking. °· Taking over-the-counter pain medicines, like aspirin and ibuprofen. °SYMPTOMS  °Most people with diverticulosis do not have symptoms. °DIAGNOSIS  °  Because diverticulosis often has no symptoms, health care providers often discover the condition during an exam for other colon problems. In many cases, a health care provider will diagnose diverticulosis while using a flexible scope to examine the colon (colonoscopy). °TREATMENT  °If you have never developed an infection related to diverticulosis, you may not need treatment. If you have had an infection before, treatment may include: °· Eating more  fruits, vegetables, and grains. °· Taking a fiber supplement. °· Taking a live bacteria supplement (probiotic). °· Taking medicine to relax your colon. °HOME CARE INSTRUCTIONS  °· Drink at least 6-8 glasses of water each day to prevent constipation. °· Try not to strain when you have a bowel movement. °· Keep all follow-up appointments. °If you have had an infection before:  °· Increase the fiber in your diet as directed by your health care provider or dietitian. °· Take a dietary fiber supplement if your health care provider approves. °· Only take medicines as directed by your health care provider. °SEEK MEDICAL CARE IF:  °· You have abdominal pain. °· You have bloating. °· You have cramps. °· You have not gone to the bathroom in 3 days. °SEEK IMMEDIATE MEDICAL CARE IF:  °· Your pain gets worse. °· Your bloating becomes very bad. °· You have a fever or chills, and your symptoms suddenly get worse. °· You begin vomiting. °· You have bowel movements that are bloody or black. °MAKE SURE YOU: °· Understand these instructions. °· Will watch your condition. °· Will get help right away if you are not doing well or get worse. °  °This information is not intended to replace advice given to you by your health care provider. Make sure you discuss any questions you have with your health care provider. °  °Document Released: 01/21/2004 Document Revised: 04/30/2013 Document Reviewed: 03/20/2013 °Elsevier Interactive Patient Education ©2016 [employer] °Colon Polyps °Polyps are lumps of extra tissue growing inside the body. Polyps can grow in the large intestine (colon). Most colon polyps are noncancerous (benign). However, some colon polyps can become cancerous over time. Polyps that are larger than a pea may be harmful. To be safe, caregivers remove and test all polyps. °CAUSES  °Polyps form when mutations in the genes cause your cells to grow and divide even though no more tissue is needed. °RISK FACTORS °There are a number  of risk factors that can increase your chances of getting colon polyps. They include: °· Being older than 50 years. °· Family history of colon polyps or colon cancer. °· Long-term colon diseases, such as colitis or Crohn disease. °· Being overweight. °· Smoking. °· Being inactive. °· Drinking too much alcohol. °SYMPTOMS  °Most small polyps do not cause symptoms. If symptoms are present, they may include: °· Blood in the stool. The stool may look dark red or black. °· Constipation or diarrhea that lasts longer than 1 week. °DIAGNOSIS °People often do not know they have polyps until their caregiver finds them during a regular checkup. Your caregiver can use 4 tests to check for polyps: °· Digital rectal exam. The caregiver wears gloves and feels inside the rectum. This test would find polyps only in the rectum. °· Barium enema. The caregiver puts a liquid called barium into your rectum before taking X-rays of your colon. Barium makes your colon look white. Polyps are dark, so they are easy to see in the X-ray pictures. °· Sigmoidoscopy. A thin, flexible tube (sigmoidoscope) is placed into your rectum. The sigmoidoscope has a   light and tiny camera in it. The caregiver uses the sigmoidoscope to look at the last third of your colon. °· Colonoscopy. This test is like sigmoidoscopy, but the caregiver looks at the entire colon. This is the most common method for finding and removing polyps. °TREATMENT  °Any polyps will be removed during a sigmoidoscopy or colonoscopy. The polyps are then tested for cancer. °PREVENTION  °To help lower your risk of getting more colon polyps: °· Eat plenty of fruits and vegetables. Avoid eating fatty foods. °· Do not smoke. °· Avoid drinking alcohol. °· Exercise every day. °· Lose weight if recommended by your caregiver. °· Eat plenty of calcium and folate. Foods that are rich in calcium include milk, cheese, and broccoli. Foods that are rich in folate include chickpeas, kidney beans, and  spinach. °HOME CARE INSTRUCTIONS °Keep all follow-up appointments as directed by your caregiver. You may need periodic exams to check for polyps. °SEEK MEDICAL CARE IF: °You notice bleeding during a bowel movement. °  °This information is not intended to replace advice given to you by your health care provider. Make sure you discuss any questions you have with your health care provider. °  °Document Released: 01/20/2004 Document Revised: 05/16/2014 Document Reviewed: 07/05/2011 °Elsevier Interactive Patient Education ©2016 Elsevier Inc. ° °

## 2015-03-24 ENCOUNTER — Encounter (HOSPITAL_COMMUNITY): Payer: Self-pay | Admitting: Internal Medicine

## 2015-03-25 ENCOUNTER — Encounter: Payer: Self-pay | Admitting: Internal Medicine

## 2015-04-10 ENCOUNTER — Other Ambulatory Visit: Payer: Self-pay | Admitting: Neurology

## 2015-04-10 ENCOUNTER — Ambulatory Visit (HOSPITAL_COMMUNITY)
Admission: RE | Admit: 2015-04-10 | Discharge: 2015-04-10 | Disposition: A | Discharge status: Home / Self Care | Payer: BLUE CROSS/BLUE SHIELD | Source: Ambulatory Visit | Attending: Neurology | Admitting: Neurology

## 2015-04-10 DIAGNOSIS — G459 Transient cerebral ischemic attack, unspecified: Secondary | ICD-10-CM

## 2015-04-13 ENCOUNTER — Encounter: Payer: Self-pay | Admitting: Cardiology

## 2015-04-13 ENCOUNTER — Encounter: Payer: Self-pay | Admitting: *Deleted

## 2015-04-13 ENCOUNTER — Ambulatory Visit (INDEPENDENT_AMBULATORY_CARE_PROVIDER_SITE_OTHER): Payer: BLUE CROSS/BLUE SHIELD | Admitting: Cardiology

## 2015-04-13 ENCOUNTER — Telehealth: Payer: Self-pay | Admitting: Cardiology

## 2015-04-13 VITALS — BP 102/66 | HR 96 | Ht 71.0 in | Wt 253.0 lb

## 2015-04-13 DIAGNOSIS — R079 Chest pain, unspecified: Secondary | ICD-10-CM | POA: Diagnosis not present

## 2015-04-13 DIAGNOSIS — I2510 Atherosclerotic heart disease of native coronary artery without angina pectoris: Secondary | ICD-10-CM

## 2015-04-13 MED ORDER — RANOLAZINE ER 500 MG PO TB12: 500.0000 mg | ORAL_TABLET | Freq: Two times a day (BID) | ORAL | Status: DC

## 2015-04-13 MED ORDER — ALBUTEROL SULFATE HFA 108 (90 BASE) MCG/ACT IN AERS
2.0000 | INHALATION_SPRAY | Freq: Four times a day (QID) | RESPIRATORY_TRACT | Status: AC | PRN
Start: 2015-04-13 — End: ?

## 2015-04-13 NOTE — Progress Notes (Signed)
Patient ID: Larry Pham, male   DOB: [birth year], 49 y.o.   MRN: [id number]     Clinical Summary Larry Pham is a 49 y.o.male seen today for follow up of the following medical problems. This is a focused visit on recent symptoms of chest pain.   1. CAD  - prior PCI to RCA in [place], [place] in setting of inferior STEMI. Noted distal LM disease of 50% at that time.  - continued to have symptoms, seen at [facility]. [facility] MRI showed small RCA infarct with no current ischemic defects.  - 02/2012 echo 50-55% - Jan 2014 cath at [facility] with PCI to RCA with DES, left main disease described as 40% - Jan 2015 MPI without ischemia - 12/2014 MPI without clear ischemia.Small inferoseptal defect with mild reversibility, attenuation artifact vs small area of ischemia - imdur in the past reportedly made chest pain symptoms worst.     - symptoms at 100 yards DOE, mild chest pressure. Better with NG. Since August increase in frequency. Does not occur at rest. 7/10 in severity. SOB and chest pain at 1 flight of stairs, noticed at work. 2 weeks ago episode of chest pressure while raking leaves, took NG and rested until resolved.     Past Medical History  Diagnosis Date  . Hypertension   . Carpal tunnel syndrome   . GERD (gastroesophageal reflux disease)   . Seasonal allergies   . History of ETOH abuse     STOPPED 9 MONTHS AGO  . Hyperlipidemia   . MI (myocardial infarction) ([place]) 02-2012  . CAD (coronary artery disease)   . Coronary atherosclerosis of artery bypass graft      Allergies  Allergen Reactions  . Bee Venom      Current Outpatient Prescriptions  Medication Sig Dispense Refill  . aspirin EC 81 MG tablet Take 81 mg by mouth every morning.    Marland Kitchen atorvastatin (LIPITOR) 80 MG tablet Take 80 mg by mouth daily.    Marland Kitchen buPROPion (WELLBUTRIN) 75 MG tablet Take 150 mg by mouth 2 (two) times daily.    . Cholecalciferol (VITAMIN D-3) 1000 UNITS CAPS Take 2,000 Units by mouth daily.     .  hydrochlorothiazide (HYDRODIURIL) 25 MG tablet Take 25 mg by mouth daily.    Marland Kitchen HYDROcodone-acetaminophen (NORCO/VICODIN) 5-325 MG per tablet Take one-two tabs po q 4-6 hrs prn pain 20 tablet 0  . lisinopril (PRINIVIL,ZESTRIL) 40 MG tablet Take 40 mg by mouth daily.    . metoprolol succinate (TOPROL-XL) 50 MG 24 hr tablet Take 1 tablet (50 mg total) by mouth daily. 30 tablet 6  . naproxen (NAPROSYN) 500 MG tablet Take 500 mg by mouth daily.     . nitroGLYCERIN (NITROSTAT) 0.4 MG SL tablet Place 1 tablet (0.4 mg total) under the tongue every 5 (five) minutes as needed for chest pain. 25 tablet 3  . omeprazole (PRILOSEC) 20 MG capsule Take 20 mg by mouth daily.    . polyethylene glycol-electrolytes (NULYTELY/GOLYTELY) 420 G solution Take 4,000 mLs by mouth once. 4000 mL 0  . tamsulosin (FLOMAX) 0.4 MG CAPS capsule Take 0.4 mg by mouth daily.      No current facility-administered medications for this visit.     Past Surgical History  Procedure Laterality Date  . Coronary artery catheterization with stenting N/A 02-2102 AND 05-2012  . Left forearm surgery      s/p machine accident  . Trigger finger release      right 5th digit  .  Coronary angioplasty    . Colonoscopy with propofol N/A 03/23/2015    Procedure: COLONOSCOPY WITH PROPOFOL;  Surgeon: Daneil Dolin, MD;  Location: [facility];  Service: Endoscopy;  Laterality: N/A;  [place] time in 0849  time out  0959  total time 10 mintues  . Polypectomy N/A 03/23/2015    Procedure: POLYPECTOMY;  Surgeon: Daneil Dolin, MD;  Location: [facility];  Service: Endoscopy;  Laterality: N/A;  cecal, descending colon     Allergies  Allergen Reactions  . Bee Venom       Family History  Problem Relation Age of Onset  . Hypertension Mother   . Heart disease Mother   . Cirrhosis Father     DIED AT AGE 50  . Breast cancer Paternal Grandmother     DECEASED  . Colon cancer Neg Hx      Social History Larry Pham reports that he has been smoking  Cigarettes.  He started smoking about 37 years ago. He has a 30 pack-year smoking history. He has never used smokeless tobacco. Larry Pham reports that he drinks alcohol.   Review of Systems CONSTITUTIONAL: No weight loss, fever, chills, weakness or fatigue.  HEENT: Eyes: No visual loss, blurred vision, double vision or yellow sclerae.No hearing loss, sneezing, congestion, runny nose or sore throat.  SKIN: No rash or itching.  CARDIOVASCULAR: per HPI RESPIRATORY: No shortness of breath, cough or sputum.  GASTROINTESTINAL: No anorexia, nausea, vomiting or diarrhea. No abdominal pain or blood.  GENITOURINARY: No burning on urination, no polyuria NEUROLOGICAL: No headache, dizziness, syncope, paralysis, ataxia, numbness or tingling in the extremities. No change in bowel or bladder control.  MUSCULOSKELETAL: No muscle, back pain, joint pain or stiffness.  LYMPHATICS: No enlarged nodes. No history of splenectomy.  PSYCHIATRIC: No history of depression or anxiety.  ENDOCRINOLOGIC: No reports of sweating, cold or heat intolerance. No polyuria or polydipsia.  Marland Kitchen   Physical Examination Filed Vitals:   04/13/15 1502  BP: 102/66  Pulse: 96   Filed Vitals:   04/13/15 1502  Height: 5\' 11"  (1.803 m)  Weight: 253 lb (114.76 kg)    Gen: resting comfortably, no acute distress HEENT: no scleral icterus, pupils equal round and reactive, no palptable cervical adenopathy,  CV: RRR, no m/r/g, no jvd Resp: Clear to auscultation bilaterally GI: abdomen is soft, non-tender, non-distended, normal bowel sounds, no hepatosplenomegaly MSK: extremities are warm, no edema.  Skin: warm, no rash Neuro:  no focal deficits Psych: appropriate affect   Diagnostic Studies 05/2013 MPI Analysis of the raw data showed no increased extracardiac radiotracer uptake. Analysis of the perfusion images showed a small mild fixed mid inferior wall defect. Left ventricular cavity size was normal. Regional wall motion was  normal, indicating that the defect was due to soft tissue attenuation artifact. Left ventricular systolic function was calculated to be mildly reduced, EF 42%. However, systolic function appeared to be normal with an approximated EF of 50%.  IMPRESSION: 1. Normal myocardial perfusion imaging. No evidence of ischemia or scar.  2. Left ventricular systolic function was calculated to be mildly reduced, EF 42%. However, systolic function appeared to be grossly normal with an approximated EF of 50%.   05/2012 Cath at [facility] Cardiac Catheterization Results: Coronary arteries: Left main: 40% distal Left anterior descending: 30% ostial, scattered irregs Left circumflex: 40% mid, scattered irregs Right coronary: widely patent proximal stent, 70% mid, 40% at angle, diffuse irregs FFR of left main into LAD: 0.81  PCI  Percutaneous coronary intervention performed of the mid RCA lesion which was stented with a 3.0 mm x 72mm Promus DES Results: 70% to normal  INTERVENTIONAL IMPRESSIONS and RECOMMENDATIONS: Successful PCI of the mid RCA lesion Aspirin 81 mg daily indefinitely Ticagrelor (Brilinta) 90 mg PO BID for at least 12 months Risk factor modification, smoking cessation  12/2014 Lexiscan MPI  This is a low risk study.  The left ventricular ejection fraction is mildly decreased (45-54%).  Small mild inferoseptal defect with mild reversibility. Differences in attenuation verse small area of ischemia, overall low risk finding.    04/2015 Carotid US IMPRESSION: No hemodynamically significant stenosis or plaque is noted in either cervical carotid artery.  Assessment and Plan  1. CAD - prior interventions on RCA x 2, most recent at [facility] with DES placement. Of note he also has 40-50% left main disease - reports recent increase in chest pains. Stress test 12/2014 low risk, though question of attenuation vs small area of ischemia. - did not tolerate imdur. Has some  orthostatic symptoms, will try ranexa 65mmg bid. - symptoms have progressed since 12/2014, will refer for cath  2. SOB - can have some wheezing at times, will prescribe prn albuterol  F/u 1 month  Arnoldo Lenis, M.D.

## 2015-04-13 NOTE — Telephone Encounter (Signed)
°  [facility] 04/22/15 @11 :30a

## 2015-04-13 NOTE — Patient Instructions (Addendum)
Your physician recommends that you schedule a follow-up appointment in: 1 month with Dr. [name]  Your physician has recommended you make the following change in your medication:   START ALBUTEROL INHALER 2 PUFFS EVERY 6 HOURS AS NEEDED  START RANEXA 500 MG [place] physician has requested that you have a cardiac catheterization. Cardiac catheterization is used to diagnose and/or treat various heart conditions. Doctors may recommend this procedure for a number of different reasons. The most common reason is to evaluate chest pain. Chest pain can be a symptom of coronary artery disease (CAD), and cardiac catheterization can show whether plaque is narrowing or blocking your heart's arteries. This procedure is also used to evaluate the valves, as well as measure the blood flow and oxygen levels in different parts of your heart. For further information please visit [email or web address]. Please follow instruction sheet, as given.  Thank you for choosing [facility]!!

## 2015-04-14 ENCOUNTER — Ambulatory Visit (HOSPITAL_COMMUNITY)
Admission: RE | Admit: 2015-04-14 | Discharge: 2015-04-14 | Disposition: A | Discharge status: Home / Self Care | Payer: BLUE CROSS/BLUE SHIELD | Source: Ambulatory Visit | Attending: Neurology | Admitting: Neurology

## 2015-04-14 DIAGNOSIS — I252 Old myocardial infarction: Secondary | ICD-10-CM | POA: Diagnosis not present

## 2015-04-14 DIAGNOSIS — I10 Essential (primary) hypertension: Secondary | ICD-10-CM | POA: Diagnosis not present

## 2015-04-14 DIAGNOSIS — I371 Nonrheumatic pulmonary valve insufficiency: Secondary | ICD-10-CM | POA: Diagnosis not present

## 2015-04-14 DIAGNOSIS — G459 Transient cerebral ischemic attack, unspecified: Secondary | ICD-10-CM | POA: Diagnosis present

## 2015-04-14 NOTE — Telephone Encounter (Signed)
[place] [id number] exp 05-13-15

## 2015-04-15 ENCOUNTER — Other Ambulatory Visit: Payer: Self-pay | Admitting: Cardiology

## 2015-04-15 DIAGNOSIS — R0789 Other chest pain: Secondary | ICD-10-CM

## 2015-04-22 ENCOUNTER — Ambulatory Visit (HOSPITAL_COMMUNITY)
Admission: RE | Admit: 2015-04-22 | Discharge: 2015-04-22 | Disposition: A | Discharge status: Home / Self Care | Payer: BLUE CROSS/BLUE SHIELD | Source: Ambulatory Visit | Attending: Cardiovascular Disease | Admitting: Cardiovascular Disease

## 2015-04-22 ENCOUNTER — Encounter (HOSPITAL_COMMUNITY)
Admission: RE | Disposition: A | Discharge status: Home / Self Care | Payer: Self-pay | Source: Ambulatory Visit | Attending: Cardiovascular Disease

## 2015-04-22 DIAGNOSIS — I25110 Atherosclerotic heart disease of native coronary artery with unstable angina pectoris: Secondary | ICD-10-CM | POA: Insufficient documentation

## 2015-04-22 DIAGNOSIS — I10 Essential (primary) hypertension: Secondary | ICD-10-CM | POA: Diagnosis not present

## 2015-04-22 DIAGNOSIS — Z955 Presence of coronary angioplasty implant and graft: Secondary | ICD-10-CM | POA: Diagnosis not present

## 2015-04-22 DIAGNOSIS — R0789 Other chest pain: Secondary | ICD-10-CM

## 2015-04-22 DIAGNOSIS — K219 Gastro-esophageal reflux disease without esophagitis: Secondary | ICD-10-CM | POA: Insufficient documentation

## 2015-04-22 DIAGNOSIS — E785 Hyperlipidemia, unspecified: Secondary | ICD-10-CM | POA: Diagnosis not present

## 2015-04-22 DIAGNOSIS — I25119 Atherosclerotic heart disease of native coronary artery with unspecified angina pectoris: Secondary | ICD-10-CM | POA: Insufficient documentation

## 2015-04-22 DIAGNOSIS — I252 Old myocardial infarction: Secondary | ICD-10-CM | POA: Diagnosis not present

## 2015-04-22 HISTORY — PX: CARDIAC CATHETERIZATION: SHX172

## 2015-04-22 LAB — BASIC METABOLIC PANEL
ANION GAP: 7 (ref 5–15)
BUN: 16 mg/dL (ref 6–20)
CALCIUM: 9.3 mg/dL (ref 8.9–10.3)
CO2: 24 mmol/L (ref 22–32)
Chloride: 105 mmol/L (ref 101–111)
Creatinine, Ser: 1.18 mg/dL (ref 0.61–1.24)
GFR calc Af Amer: >60 mL/min (ref >60)
Glucose, Bld: 117 mg/dL — ABNORMAL HIGH (ref 65–99)
POTASSIUM: 4.2 mmol/L (ref 3.5–5.1)
SODIUM: 136 mmol/L (ref 135–145)

## 2015-04-22 LAB — CBC
HEMATOCRIT: 45.1 % (ref 39.0–52.0)
HEMOGLOBIN: 14.9 g/dL (ref 13.0–17.0)
MCH: 30.6 pg (ref 26.0–34.0)
MCHC: 33 g/dL (ref 30.0–36.0)
MCV: 92.6 fL (ref 78.0–100.0)
Platelets: 232 10*3/uL (ref 150–400)
RBC: 4.87 MIL/uL (ref 4.22–5.81)
RDW: 14.3 % (ref 11.5–15.5)
WBC: 7.6 10*3/uL (ref 4.0–10.5)

## 2015-04-22 LAB — PROTIME-INR
INR: 1.08 (ref 0.00–1.49)
PROTHROMBIN TIME: 14.2 s (ref 11.6–15.2)

## 2015-04-22 LAB — POCT ACTIVATED CLOTTING TIME: ACTIVATED CLOTTING TIME: 394 s

## 2015-04-22 SURGERY — LEFT HEART CATH AND CORONARY ANGIOGRAPHY
Anesthesia: LOCAL

## 2015-04-22 MED ORDER — SODIUM CHLORIDE 0.9 % IJ SOLN: 3.0000 mL | INTRAMUSCULAR | Status: DC | PRN

## 2015-04-22 MED ORDER — ASPIRIN 81 MG PO CHEW: 81.0000 mg | CHEWABLE_TABLET | ORAL | Status: DC

## 2015-04-22 MED ORDER — VERAPAMIL HCL 2.5 MG/ML IV SOLN
INTRAVENOUS | Status: DC | PRN
  Administered 2015-04-22: 10 mL via INTRA_ARTERIAL

## 2015-04-22 MED ORDER — HEPARIN SODIUM (PORCINE) 1000 UNIT/ML IJ SOLN
INTRAMUSCULAR | Status: AC
  Filled 2015-04-22: qty 1

## 2015-04-22 MED ORDER — ADENOSINE 12 MG/4ML IV SOLN
16.0000 mL | Freq: Once | INTRAVENOUS | Status: DC
  Filled 2015-04-22: qty 16

## 2015-04-22 MED ORDER — MIDAZOLAM HCL 2 MG/2ML IJ SOLN
INTRAMUSCULAR | Status: DC | PRN
  Administered 2015-04-22: 2 mg via INTRAVENOUS

## 2015-04-22 MED ORDER — LIDOCAINE HCL (PF) 1 % IJ SOLN
INTRAMUSCULAR | Status: DC | PRN
  Administered 2015-04-22: 13:00:00

## 2015-04-22 MED ORDER — FENTANYL CITRATE (PF) 100 MCG/2ML IJ SOLN
INTRAMUSCULAR | Status: AC
Start: 2015-04-22 — End: 2015-04-22
  Filled 2015-04-22: qty 2

## 2015-04-22 MED ORDER — SODIUM CHLORIDE 0.9 % IV SOLN: 250.0000 mL | INTRAVENOUS | Status: DC | PRN

## 2015-04-22 MED ORDER — ADENOSINE (DIAGNOSTIC) 140MCG/KG/MIN
INTRAVENOUS | Status: DC | PRN
  Administered 2015-04-22: 140 ug/kg/min via INTRAVENOUS

## 2015-04-22 MED ORDER — FENTANYL CITRATE (PF) 100 MCG/2ML IJ SOLN
INTRAMUSCULAR | Status: DC | PRN
  Administered 2015-04-22: 50 ug via INTRAVENOUS

## 2015-04-22 MED ORDER — SODIUM CHLORIDE 0.9 % IV SOLN
INTRAVENOUS | Status: AC
Start: 2015-04-22 — End: 2015-04-22

## 2015-04-22 MED ORDER — SODIUM CHLORIDE 0.9 % IJ SOLN: 3.0000 mL | Freq: Two times a day (BID) | INTRAMUSCULAR | Status: DC

## 2015-04-22 MED ORDER — HEPARIN SODIUM (PORCINE) 1000 UNIT/ML IJ SOLN
INTRAMUSCULAR | Status: DC | PRN
  Administered 2015-04-22: 5500 [IU] via INTRAVENOUS
  Administered 2015-04-22: 4500 [IU] via INTRAVENOUS

## 2015-04-22 MED ORDER — HEPARIN (PORCINE) IN NACL 2-0.9 UNIT/ML-% IJ SOLN
INTRAMUSCULAR | Status: AC
  Filled 2015-04-22: qty 1500

## 2015-04-22 MED ORDER — MIDAZOLAM HCL 2 MG/2ML IJ SOLN
INTRAMUSCULAR | Status: AC
  Filled 2015-04-22: qty 2

## 2015-04-22 MED ORDER — LIDOCAINE HCL (PF) 1 % IJ SOLN
INTRAMUSCULAR | Status: AC
Start: 2015-04-22 — End: 2015-04-22
  Filled 2015-04-22: qty 30

## 2015-04-22 MED ORDER — SODIUM CHLORIDE 0.9 % WEIGHT BASED INFUSION
1.0000 mL/kg/h | INTRAVENOUS | Status: DC
Start: 2015-04-22 — End: 2015-04-22

## 2015-04-22 MED ORDER — SODIUM CHLORIDE 0.9 % WEIGHT BASED INFUSION
3.0000 mL/kg/h | INTRAVENOUS | Status: AC
  Administered 2015-04-22: 3 mL/kg/h via INTRAVENOUS

## 2015-04-22 MED ORDER — VERAPAMIL HCL 2.5 MG/ML IV SOLN
INTRAVENOUS | Status: AC
  Filled 2015-04-22: qty 2

## 2015-04-22 SURGICAL SUPPLY — 18 items
CATH INFINITI 5 FR JL3.5 (CATHETERS) ×2 IMPLANT
CATH INFINITI 5FR ANG PIGTAIL (CATHETERS) ×2 IMPLANT
CATH INFINITI JR4 5F (CATHETERS) ×2 IMPLANT
CATH MICROCATH NAVVUS (MICROCATHETER) ×1 IMPLANT
CATH VISTA GUIDE 6FR XBLAD3.5 (CATHETERS) ×2 IMPLANT
DEVICE RAD COMP TR BAND LRG (VASCULAR PRODUCTS) ×2 IMPLANT
GLIDESHEATH INTRODUCER 6F 10CM (SHEATH) ×2 IMPLANT
GUIDEWIRE PRESSURE COMET II (WIRE) IMPLANT
KIT ESSENTIALS PG (KITS) ×2 IMPLANT
KIT HEART LEFT (KITS) ×2 IMPLANT
MICROCATHETER NAVVUS (MICROCATHETER) ×2
NEEDLE PERC ENTRY 21G 2.5CM (NEEDLE) ×2 IMPLANT
PACK CARDIAC CATHETERIZATION (CUSTOM PROCEDURE TRAY) ×2 IMPLANT
SYR MEDRAD MARK V 150ML (SYRINGE) ×2 IMPLANT
TRANSDUCER W/STOPCOCK (MISCELLANEOUS) ×2 IMPLANT
TUBING CIL FLEX 10 FLL-RA (TUBING) ×2 IMPLANT
WIRE COUGAR XT STRL 190CM (WIRE) ×2 IMPLANT
WIRE SAFE-T 1.5MM-J .035X260CM (WIRE) ×2 IMPLANT

## 2015-04-22 NOTE — Interval H&P Note (Signed)
History and Physical Interval Note:  04/22/2015 11:53 AM  Larry Pham  has presented today for cardiac cath with the diagnosis of unstable angina, known CAD. The various methods of treatment have been discussed with the patient and family. After consideration of risks, benefits and other options for treatment, the patient has consented to  Procedure(s): Left Heart Cath and Coronary Angiography (N/A) as a surgical intervention .  The patient's history has been reviewed, patient examined, no change in status, stable for surgery.  I have reviewed the patient's chart and labs.  Questions were answered to the patient's satisfaction.    Cath Lab Visit (complete for each Cath Lab visit)  Clinical Evaluation Leading to the Procedure:   ACS: No.  Non-ACS:    Anginal Classification: CCS III  Anti-ischemic medical therapy: Maximal Therapy (2 or more classes of medications)  Non-Invasive Test Results: No non-invasive testing performed  Prior CABG: No previous CABG        Krystle Polcyn

## 2015-04-22 NOTE — H&P (View-Only) (Signed)
Patient ID: Larry Pham, male   DOB: 1966-01-26, 49 y.o.   MRN: [id number]     Clinical Summary Larry Pham is a 49 y.o.male seen today for follow up of the following medical problems. This is a focused visit on recent symptoms of chest pain.   1. CAD  - prior PCI to RCA in [place], [place] in setting of inferior STEMI. Noted distal LM disease of 50% at that time.  - continued to have symptoms, seen at [facility]. Adensoine MRI showed small RCA infarct with no current ischemic defects.  - 02/2012 echo 50-55% - Jan 2014 cath at [facility] with PCI to RCA with DES, left main disease described as 40% - Jan 2015 MPI without ischemia - 12/2014 MPI without clear ischemia.Small inferoseptal defect with mild reversibility, attenuation artifact vs small area of ischemia - imdur in the past reportedly made chest pain symptoms worst.     - symptoms at 100 yards DOE, mild chest pressure. Better with NG. Since August increase in frequency. Does not occur at rest. 7/10 in severity. SOB and chest pain at 1 flight of stairs, noticed at work. 2 weeks ago episode of chest pressure while raking leaves, took NG and rested until resolved.     Past Medical History  Diagnosis Date  . Hypertension   . Carpal tunnel syndrome   . GERD (gastroesophageal reflux disease)   . Seasonal allergies   . History of ETOH abuse     STOPPED 9 MONTHS AGO  . Hyperlipidemia   . MI (myocardial infarction) ([place]) 02-2012  . CAD (coronary artery disease)   . Coronary atherosclerosis of artery bypass graft      Allergies  Allergen Reactions  . Bee Venom      Current Outpatient Prescriptions  Medication Sig Dispense Refill  . aspirin EC 81 MG tablet Take 81 mg by mouth every morning.    Marland Kitchen atorvastatin (LIPITOR) 80 MG tablet Take 80 mg by mouth daily.    Marland Kitchen buPROPion (WELLBUTRIN) 75 MG tablet Take 150 mg by mouth 2 (two) times daily.    . Cholecalciferol (VITAMIN D-3) 1000 UNITS CAPS Take 2,000 Units by mouth daily.     .  hydrochlorothiazide (HYDRODIURIL) 25 MG tablet Take 25 mg by mouth daily.    Marland Kitchen HYDROcodone-acetaminophen (NORCO/VICODIN) 5-325 MG per tablet Take one-two tabs po q 4-6 hrs prn pain 20 tablet 0  . lisinopril (PRINIVIL,ZESTRIL) 40 MG tablet Take 40 mg by mouth daily.    . metoprolol succinate (TOPROL-XL) 50 MG 24 hr tablet Take 1 tablet (50 mg total) by mouth daily. 30 tablet 6  . naproxen (NAPROSYN) 500 MG tablet Take 500 mg by mouth daily.     . nitroGLYCERIN (NITROSTAT) 0.4 MG SL tablet Place 1 tablet (0.4 mg total) under the tongue every 5 (five) minutes as needed for chest pain. 25 tablet 3  . omeprazole (PRILOSEC) 20 MG capsule Take 20 mg by mouth daily.    . polyethylene glycol-electrolytes (NULYTELY/GOLYTELY) 420 G solution Take 4,000 mLs by mouth once. 4000 mL 0  . tamsulosin (FLOMAX) 0.4 MG CAPS capsule Take 0.4 mg by mouth daily.      No current facility-administered medications for this visit.     Past Surgical History  Procedure Laterality Date  . Coronary artery catheterization with stenting N/A 02-2102 AND 05-2012  . Left forearm surgery      s/p machine accident  . Trigger finger release      right 5th digit  .  Coronary angioplasty    . Colonoscopy with propofol N/A 03/23/2015    Procedure: COLONOSCOPY WITH PROPOFOL;  Surgeon: Daneil Dolin, MD;  Location: [facility];  Service: Endoscopy;  Laterality: N/A;  Cecum time in 0849  time out  0959  total time 10 mintues  . Polypectomy N/A 03/23/2015    Procedure: POLYPECTOMY;  Surgeon: Daneil Dolin, MD;  Location: [facility];  Service: Endoscopy;  Laterality: N/A;  cecal, descending colon     Allergies  Allergen Reactions  . Bee Venom       Family History  Problem Relation Age of Onset  . Hypertension Mother   . Heart disease Mother   . Cirrhosis Father     DIED AT AGE 73  . Breast cancer Paternal Grandmother     DECEASED  . Colon cancer Neg Hx      Social History Larry Pham reports that he has been smoking  Cigarettes.  He started smoking about 37 years ago. He has a 30 pack-year smoking history. He has never used smokeless tobacco. Larry Pham reports that he drinks alcohol.   Review of Systems CONSTITUTIONAL: No weight loss, fever, chills, weakness or fatigue.  HEENT: Eyes: No visual loss, blurred vision, double vision or yellow sclerae.No hearing loss, sneezing, congestion, runny nose or sore throat.  SKIN: No rash or itching.  CARDIOVASCULAR: per HPI RESPIRATORY: No shortness of breath, cough or sputum.  GASTROINTESTINAL: No anorexia, nausea, vomiting or diarrhea. No abdominal pain or blood.  GENITOURINARY: No burning on urination, no polyuria NEUROLOGICAL: No headache, dizziness, syncope, paralysis, ataxia, numbness or tingling in the extremities. No change in bowel or bladder control.  MUSCULOSKELETAL: No muscle, back pain, joint pain or stiffness.  LYMPHATICS: No enlarged nodes. No history of splenectomy.  PSYCHIATRIC: No history of depression or anxiety.  ENDOCRINOLOGIC: No reports of sweating, cold or heat intolerance. No polyuria or polydipsia.  Marland Kitchen   Physical Examination Filed Vitals:   04/13/15 1502  BP: 102/66  Pulse: 96   Filed Vitals:   04/13/15 1502  Height: 5\' 11"  (1.803 m)  Weight: 253 lb (114.76 kg)    Gen: resting comfortably, no acute distress HEENT: no scleral icterus, pupils equal round and reactive, no palptable cervical adenopathy,  CV: RRR, no m/r/g, no jvd Resp: Clear to auscultation bilaterally GI: abdomen is soft, non-tender, non-distended, normal bowel sounds, no hepatosplenomegaly MSK: extremities are warm, no edema.  Skin: warm, no rash Neuro:  no focal deficits Psych: appropriate affect   Diagnostic Studies 05/2013 MPI Analysis of the raw data showed no increased extracardiac radiotracer uptake. Analysis of the perfusion images showed a small mild fixed mid inferior wall defect. Left ventricular cavity size was normal. Regional wall motion was  normal, indicating that the defect was due to soft tissue attenuation artifact. Left ventricular systolic function was calculated to be mildly reduced, EF 42%. However, systolic function appeared to be normal with an approximated EF of 50%.  IMPRESSION: 1. Normal myocardial perfusion imaging. No evidence of ischemia or scar.  2. Left ventricular systolic function was calculated to be mildly reduced, EF 42%. However, systolic function appeared to be grossly normal with an approximated EF of 50%.   05/2012 Cath at [facility] Left [facility] Cardiac Catheterization Results: Coronary arteries: Left main: 40% distal Left anterior descending: 30% ostial, scattered irregs Left circumflex: 40% mid, scattered irregs Right coronary: widely patent proximal stent, 70% mid, 40% at angle, diffuse irregs FFR of left main into LAD: 0.81  PCI  Percutaneous coronary intervention performed of the mid RCA lesion which was stented with a 3.0 mm x 10mm Promus DES Results: 70% to normal  INTERVENTIONAL IMPRESSIONS and RECOMMENDATIONS: Successful PCI of the mid RCA lesion Aspirin 81 mg daily indefinitely Ticagrelor (Brilinta) 90 mg PO BID for at least 12 months Risk factor modification, smoking cessation  12/2014 Lexiscan MPI  This is a low risk study.  The left ventricular ejection fraction is mildly decreased (45-54%).  Small mild inferoseptal defect with mild reversibility. Differences in attenuation verse small area of ischemia, overall low risk finding.    04/2015 Carotid US IMPRESSION: No hemodynamically significant stenosis or plaque is noted in either cervical carotid artery.  Assessment and Plan  1. CAD - prior interventions on RCA x 2, most recent at [facility] with DES placement. Of note he also has 40-50% left main disease - reports recent increase in chest pains. Stress test 12/2014 low risk, though question of attenuation vs small area of ischemia. - did not tolerate imdur. Has some  orthostatic symptoms, will try ranexa 25mmg bid. - symptoms have progressed since 12/2014, will refer for cath  2. SOB - can have some wheezing at times, will prescribe prn albuterol  F/u 1 month  Arnoldo Lenis, M.D.

## 2015-04-22 NOTE — Discharge Instructions (Signed)
Radial Site Care °Refer to this sheet in the next few weeks. These instructions provide you with information about caring for yourself after your procedure. Your health care provider may also give you more specific instructions. Your treatment has been planned according to current medical practices, but problems sometimes occur. Call your health care provider if you have any problems or questions after your procedure. °WHAT TO EXPECT AFTER THE PROCEDURE °After your procedure, it is typical to have the following: °· Bruising at the radial site that usually fades within 1-2 weeks. °· Blood collecting in the tissue (hematoma) that may be painful to the touch. It should usually decrease in size and tenderness within 1-2 weeks. °HOME CARE INSTRUCTIONS °· Take medicines only as directed by your health care provider. °· You may shower 24-48 hours after the procedure or as directed by your health care provider. Remove the bandage (dressing) and gently wash the site with plain soap and water. Pat the area dry with a clean towel. Do not rub the site, because this may cause bleeding. °· Do not take baths, swim, or use a hot tub until your health care provider approves. °· Check your insertion site every day for redness, swelling, or drainage. °· Do not apply powder or lotion to the site. °· Do not flex or bend the affected arm for 24 hours or as directed by your health care provider. °· Do not push or pull heavy objects with the affected arm for 24 hours or as directed by your health care provider. °· Do not lift over 10 lb (4.5 kg) for 5 days after your procedure or as directed by your health care provider. °· Ask your health care provider when it is okay to: °¨ Return to work or school. °¨ Resume usual physical activities or sports. °¨ Resume sexual activity. °· Do not drive home if you are discharged the same day as the procedure. Have someone else drive you. °· You may drive 24 hours after the procedure unless otherwise  instructed by your health care provider. °· Do not operate machinery or power tools for 24 hours after the procedure. °· If your procedure was done as an outpatient procedure, which means that you went home the same day as your procedure, a responsible adult should be with you for the first 24 hours after you arrive home. °· Keep all follow-up visits as directed by your health care provider. This is important. °SEEK MEDICAL CARE IF: °· You have a fever. °· You have chills. °· You have increased bleeding from the radial site. Hold pressure on the site. °SEEK IMMEDIATE MEDICAL CARE IF: °· You have unusual pain at the radial site. °· You have redness, warmth, or swelling at the radial site. °· You have drainage (other than a small amount of blood on the dressing) from the radial site. °· The radial site is bleeding, and the bleeding does not stop after 30 minutes of holding steady pressure on the site. °· Your arm or hand becomes pale, cool, tingly, or numb. °  °This information is not intended to replace advice given to you by your health care provider. Make sure you discuss any questions you have with your health care provider. °  °Document Released: 05/28/2010 Document Revised: 05/16/2014 Document Reviewed: 11/11/2013 °Elsevier Interactive Patient Education ©2016 [employer] ° °

## 2015-04-23 ENCOUNTER — Encounter (HOSPITAL_COMMUNITY): Payer: Self-pay | Admitting: Cardiovascular Disease

## 2015-05-08 ENCOUNTER — Ambulatory Visit (INDEPENDENT_AMBULATORY_CARE_PROVIDER_SITE_OTHER): Payer: BLUE CROSS/BLUE SHIELD | Admitting: Urology

## 2015-05-08 DIAGNOSIS — N401 Enlarged prostate with lower urinary tract symptoms: Secondary | ICD-10-CM | POA: Diagnosis not present

## 2015-05-08 DIAGNOSIS — N3941 Urge incontinence: Secondary | ICD-10-CM | POA: Diagnosis not present

## 2015-05-08 DIAGNOSIS — R351 Nocturia: Secondary | ICD-10-CM

## 2015-05-08 DIAGNOSIS — R3912 Poor urinary stream: Secondary | ICD-10-CM

## 2015-05-18 ENCOUNTER — Ambulatory Visit: Payer: BLUE CROSS/BLUE SHIELD | Admitting: Cardiology

## 2015-05-18 NOTE — Progress Notes (Unsigned)
Patient ID: Larry Pham, male   DOB: 05/14/1965, 50 y.o.   MRN: [id number]     Clinical Summary Larry Pham is a 50 y.o.male seen today for follow up of the following medical problems.   1. CAD  - prior PCI to RCA in [place], [place] in setting of inferior STEMI. Noted distal LM disease of 50% at that time.  - continued to have symptoms, seen at [facility]. [facility]ensoine MRI showed small RCA infarct with no current ischemic defects.  - 02/2012 echo 50-55% - Jan 2014 cath at [facility] with PCI to RCA with DES, left main disease described as 40% - Jan 2015 MPI without ischemia - 12/2014 MPI without clear ischemia.Small inferoseptal defect with mild reversibility, attenuation artifact vs small area of ischemia 04/2015 cath with moderate LM disease, patent RCA stents, mod LAD disease, severe stneosis small diastal LCX too small for PCI.  - imdur in the past reportedly made chest pain symptoms worst.    - symptoms at 100 yards DOE, mild chest pressure. Better with NG. Since August increase in frequency. Does not occur at rest. 7/10 in severity. SOB and chest pain at 1 flight of stairs, noticed at work. 2 weeks ago episode of chest pressure while raking leaves, took NG and rested until resolved.    - since last visit completed cath which showed moderate disease, severe small distal LCX disease too small for PCI.    2. Hyperlipidemia  - compliant with high dose statin  3. HTN  - Typically around 120s/70-80s at home by his report - compliant with meds, however has not taken meds yet today  4. Tobacco - electronic cigs and patches did not work. Chantix has not worked before.  - currently not interested in quitting  Past Medical History  Diagnosis Date  . Hypertension   . Carpal tunnel syndrome   . GERD (gastroesophageal reflux disease)   . Seasonal allergies   . History of ETOH abuse     STOPPED 9 MONTHS AGO  . Hyperlipidemia   . MI (myocardial infarction) ([place]) 02-2012  . CAD (coronary  artery disease)   . Coronary atherosclerosis of artery bypass graft      Allergies  Allergen Reactions  . Bee Venom Swelling     Current Outpatient Prescriptions  Medication Sig Dispense Refill  . albuterol (PROVENTIL HFA;VENTOLIN HFA) 108 (90 BASE) MCG/ACT inhaler Inhale 2 puffs into the lungs every 6 (six) hours as needed for wheezing or shortness of breath. 1 Inhaler 2  . aspirin EC 81 MG tablet Take 81 mg by mouth every morning.    Marland Kitchen atorvastatin (LIPITOR) 80 MG tablet Take 80 mg by mouth daily.    Marland Kitchen buPROPion (WELLBUTRIN) 75 MG tablet Take 150 mg by mouth 2 (two) times daily as needed.     . cetirizine (ZYRTEC) 10 MG tablet Take 10 mg by mouth daily as needed for allergies.   12  . hydrochlorothiazide (HYDRODIURIL) 25 MG tablet Take 25 mg by mouth daily.    Marland Kitchen lisinopril (PRINIVIL,ZESTRIL) 40 MG tablet Take 40 mg by mouth daily.    . metoprolol succinate (TOPROL-XL) 50 MG 24 hr tablet Take 1 tablet (50 mg total) by mouth daily. 30 tablet 6  . naproxen (NAPROSYN) 500 MG tablet Take 500 mg by mouth daily.     . nitroGLYCERIN (NITROSTAT) 0.4 MG SL tablet Place 1 tablet (0.4 mg total) under the tongue every 5 (five) minutes as needed for chest pain. 25 tablet 3  .  omeprazole (PRILOSEC) 20 MG capsule Take 20 mg by mouth daily.    . ranolazine (RANEXA) 500 MG 12 hr tablet Take 1 tablet (500 mg total) by mouth 2 (two) times daily. 180 tablet 3  . SUMAtriptan (IMITREX) 50 MG tablet Take 1 tablet by mouth as directed.  0  . tamsulosin (FLOMAX) 0.4 MG CAPS capsule Take 0.4 mg by mouth daily.     . Vitamin D, Ergocalciferol, (DRISDOL) 50000 UNITS CAPS capsule Take 1 capsule by mouth 2 (two) times a week.  5   No current facility-administered medications for this visit.     Past Surgical History  Procedure Laterality Date  . Coronary artery catheterization with stenting N/A 02-2102 AND 05-2012  . Left forearm surgery      s/p machine accident  . Trigger finger release      right 5th  digit  . Coronary angioplasty    . Colonoscopy with propofol N/A 03/23/2015    Procedure: COLONOSCOPY WITH PROPOFOL;  Surgeon: Daneil Dolin, MD;  Location: [facility];  Service: Endoscopy;  Laterality: N/A;  Cecum time in 0849  time out  0959  total time 10 mintues  . Polypectomy N/A 03/23/2015    Procedure: POLYPECTOMY;  Surgeon: Daneil Dolin, MD;  Location: [facility];  Service: Endoscopy;  Laterality: N/A;  cecal, descending colon  . Cardiac catheterization N/A 04/22/2015    Procedure: Left Heart Cath and Coronary Angiography;  Surgeon: Burnell Blanks, MD;  Location: [place] [facility];  Service: Cardiovascular;  Laterality: N/A;  . Cardiac catheterization N/A 04/22/2015    Procedure: Intravascular Pressure Wire/FFR Study;  Surgeon: Burnell Blanks, MD;  Location: [place] [facility];  Service: Cardiovascular;  Laterality: N/A;     Allergies  Allergen Reactions  . Bee Venom Swelling      Family History  Problem Relation Age of Onset  . Hypertension Mother   . Heart disease Mother   . Cirrhosis Father     DIED AT AGE 1  . Breast cancer Paternal Grandmother     DECEASED  . Colon cancer Neg Hx      Social History Larry Pham reports that he has been smoking Cigarettes.  He started smoking about 37 years ago. He has a 30 pack-year smoking history. He has never used smokeless tobacco. Larry Pham reports that he drinks alcohol.   Review of Systems CONSTITUTIONAL: No weight loss, fever, chills, weakness or fatigue.  HEENT: Eyes: No visual loss, blurred vision, double vision or yellow sclerae.No hearing loss, sneezing, congestion, runny nose or sore throat.  SKIN: No rash or itching.  CARDIOVASCULAR:  RESPIRATORY: No shortness of breath, cough or sputum.  GASTROINTESTINAL: No anorexia, nausea, vomiting or diarrhea. No abdominal pain or blood.  GENITOURINARY: No burning on urination, no polyuria NEUROLOGICAL: No headache, dizziness, syncope, paralysis, ataxia, numbness  or tingling in the extremities. No change in bowel or bladder control.  MUSCULOSKELETAL: No muscle, back pain, joint pain or stiffness.  LYMPHATICS: No enlarged nodes. No history of splenectomy.  PSYCHIATRIC: No history of depression or anxiety.  ENDOCRINOLOGIC: No reports of sweating, cold or heat intolerance. No polyuria or polydipsia.  Marland Kitchen   Physical Examination There were no vitals filed for this visit. There were no vitals filed for this visit.  Gen: resting comfortably, no acute distress HEENT: no scleral icterus, pupils equal round and reactive, no palptable cervical adenopathy,  CV Resp: Clear to auscultation bilaterally GI: abdomen is soft, non-tender, non-distended, normal bowel  sounds, no hepatosplenomegaly MSK: extremities are warm, no edema.  Skin: warm, no rash Neuro:  no focal deficits Psych: appropriate affect   Diagnostic Studies  05/2013 MPI Analysis of the raw data showed no increased extracardiac radiotracer uptake. Analysis of the perfusion images showed a small mild fixed mid inferior wall defect. Left ventricular cavity size was normal. Regional wall motion was normal, indicating that the defect was due to soft tissue attenuation artifact. Left ventricular systolic function was calculated to be mildly reduced, EF 42%. However, systolic function appeared to be normal with an approximated EF of 50%.  IMPRESSION: 1. Normal myocardial perfusion imaging. No evidence of ischemia or scar.  2. Left ventricular systolic function was calculated to be mildly reduced, EF 42%. However, systolic function appeared to be grossly normal with an approximated EF of 50%.   05/2012 Cath at [facility] Left Heart Cardiac Catheterization Results: Coronary arteries: Left main: 40% distal Left anterior descending: 30% ostial, scattered irregs Left circumflex: 40% mid, scattered irregs Right coronary: widely patent proximal stent, 70% mid, 40% at angle, diffuse irregs FFR of left  main into LAD: 0.81  PCI Percutaneous coronary intervention performed of the mid RCA lesion which was stented with a 3.0 mm x 25mm Promus DES Results: 70% to normal  INTERVENTIONAL IMPRESSIONS and RECOMMENDATIONS: Successful PCI of the mid RCA lesion Aspirin 81 mg daily indefinitely Ticagrelor (Brilinta) 90 mg PO BID for at least 12 months Risk factor modification, smoking cessation  12/2014 Lexiscan MPI  This is a low risk study.  The left ventricular ejection fraction is mildly decreased (45-54%).  Small mild inferoseptal defect with mild reversibility. Differences in attenuation verse small area of ischemia, overall low risk finding.    04/2015 Carotid US IMPRESSION: No hemodynamically significant stenosis or plaque is noted in either cervical carotid artery.  04/2015 Cath  Ost 1st Mrg to 1st Mrg lesion, 50% stenosed.  1. Moderate left main stenosis, not flow limiting by FFR (FFR of 0.93) 2. Patent stents mid RCA with minimal restenosis.  3. Moderate distal RCA stenosis.  4. Moderate ostial LAD stenosis.  5. Severe stenosis small caliber distal AV groove Circumflex beyond the takeoff of the large OM Keoni Havey. (too small for PCI) 6. Normal LV systolic function  Recommendations: He has moderate disease as described above with severe disease in the small caliber distal Circumflex. This vessel is too small for PCI. Continue medical management.     Assessment and Plan    1. CAD - prior interventions on RCA x 2, most recent at [facility] with DES placement. Of note he also has 40-50% left main disease - reports recent increase in chest pains. Stress test 12/2014 low risk, though question of attenuation vs small area of ischemia. - did not tolerate imdur. Has some orthostatic symptoms, will try ranexa 500mg  bid. - symptoms have progressed since 12/2014, will refer for cath  2. SOB - can have some wheezing at times, will prescribe prn albuterol  2. Hyperlipidemia   - continue high dose statin in setting of known CAD   3. HTN  - elevated in clinic, however has not taken his medications yet today - continue to follow blood pressures.   4. Tobacco abuse - counseled extensively on health risk, especially given his young age and aggressive coronary disease  Arnoldo Lenis, M.D., F.A.C.C.

## 2015-06-02 ENCOUNTER — Other Ambulatory Visit: Payer: Self-pay | Admitting: Cardiology

## 2015-06-02 DIAGNOSIS — R0989 Other specified symptoms and signs involving the circulatory and respiratory systems: Secondary | ICD-10-CM

## 2015-06-04 ENCOUNTER — Other Ambulatory Visit: Payer: Self-pay

## 2015-06-04 MED ORDER — NITROGLYCERIN 0.4 MG SL SUBL: 0.4000 mg | SUBLINGUAL_TABLET | SUBLINGUAL | Status: DC | PRN

## 2015-06-04 NOTE — Telephone Encounter (Signed)
Refill ntg

## 2015-06-05 ENCOUNTER — Ambulatory Visit (INDEPENDENT_AMBULATORY_CARE_PROVIDER_SITE_OTHER): Payer: BLUE CROSS/BLUE SHIELD | Admitting: Urology

## 2015-06-05 DIAGNOSIS — R351 Nocturia: Secondary | ICD-10-CM

## 2015-06-05 DIAGNOSIS — N3941 Urge incontinence: Secondary | ICD-10-CM

## 2015-06-05 DIAGNOSIS — N401 Enlarged prostate with lower urinary tract symptoms: Secondary | ICD-10-CM

## 2015-06-05 DIAGNOSIS — R3912 Poor urinary stream: Secondary | ICD-10-CM | POA: Diagnosis not present

## 2015-06-09 ENCOUNTER — Ambulatory Visit (INDEPENDENT_AMBULATORY_CARE_PROVIDER_SITE_OTHER): Payer: BLUE CROSS/BLUE SHIELD | Admitting: Cardiology

## 2015-06-09 ENCOUNTER — Encounter: Payer: Self-pay | Admitting: Cardiology

## 2015-06-09 VITALS — BP 99/66 | HR 88 | Ht 71.0 in | Wt 253.0 lb

## 2015-06-09 DIAGNOSIS — E785 Hyperlipidemia, unspecified: Secondary | ICD-10-CM

## 2015-06-09 DIAGNOSIS — I10 Essential (primary) hypertension: Secondary | ICD-10-CM | POA: Diagnosis not present

## 2015-06-09 DIAGNOSIS — I2510 Atherosclerotic heart disease of native coronary artery without angina pectoris: Secondary | ICD-10-CM | POA: Diagnosis not present

## 2015-06-09 DIAGNOSIS — R0789 Other chest pain: Secondary | ICD-10-CM

## 2015-06-09 DIAGNOSIS — M79606 Pain in leg, unspecified: Secondary | ICD-10-CM

## 2015-06-09 DIAGNOSIS — Z720 Tobacco use: Secondary | ICD-10-CM

## 2015-06-09 MED ORDER — RANOLAZINE ER 1000 MG PO TB12: 1000.0000 mg | ORAL_TABLET | Freq: Two times a day (BID) | ORAL | Status: DC

## 2015-06-09 NOTE — Progress Notes (Signed)
Patient ID: Larry Pham, male   DOB: 1965/07/21, 50 y.o.   MRN: [id number]     Clinical Summary Larry Pham is a 50 y.o.male seen today for follow up of the following medical problems.   1. CAD  - prior PCI to RCA in [place], [place] in setting of inferior STEMI. Noted distal LM disease of 50% at that time.  - continued to have symptoms, seen at [facility]. [facility] MRI showed small RCA infarct with no current ischemic defects.  - 02/2012 echo 50-55% - Jan 2014 cath at [facility] with PCI to RCA with DES, left main disease described as 40% - Jan 2015 MPI without ischemia - 12/2014 MPI without clear ischemia.Small inferoseptal defect with mild reversibility, attenuation artifact vs small area of ischemia 04/2015 cath with moderate LM disease, patent RCA stents, mod LAD disease, severe stneosis small diastal LCX too small for PCI.  - imdur in the past reportedly made chest pain symptoms worst.   - last visit described episodes of DOE and mild chest pressure better with NG.  - since last visit completed cath which showed moderate disease, severe small distal LCX disease too small for PCI.    2. Calf pain - occurs with walking bilaterally. Only occurs with walking, never at rest. Occurs at approx 2 blocks - no foot sores - has vascular studies ordered   3. Hyperlipidemia  - compliant with high dose statin  3. HTN  - Typically around 120s/80s at home by his report   4. Tobacco - electronic cigs and patches did not work. Chantix has not worked before.  - he currently remains not interested in quitting Past Medical History  Diagnosis Date  . Hypertension   . Carpal tunnel syndrome   . GERD (gastroesophageal reflux disease)   . Seasonal allergies   . History of ETOH abuse     STOPPED 9 MONTHS AGO  . Hyperlipidemia   . MI (myocardial infarction) ([place]) 02-2012  . CAD (coronary artery disease)   . Coronary atherosclerosis of artery bypass graft      Allergies  Allergen Reactions    . Bee Venom Swelling     Current Outpatient Prescriptions  Medication Sig Dispense Refill  . albuterol (PROVENTIL HFA;VENTOLIN HFA) 108 (90 BASE) MCG/ACT inhaler Inhale 2 puffs into the lungs every 6 (six) hours as needed for wheezing or shortness of breath. 1 Inhaler 2  . aspirin EC 81 MG tablet Take 81 mg by mouth every morning.    Marland Kitchen atorvastatin (LIPITOR) 80 MG tablet Take 80 mg by mouth daily.    Marland Kitchen buPROPion (WELLBUTRIN) 75 MG tablet Take 150 mg by mouth 2 (two) times daily as needed.     . cetirizine (ZYRTEC) 10 MG tablet Take 10 mg by mouth daily as needed for allergies.   12  . hydrochlorothiazide (HYDRODIURIL) 25 MG tablet Take 25 mg by mouth daily.    Marland Kitchen lisinopril (PRINIVIL,ZESTRIL) 40 MG tablet Take 40 mg by mouth daily.    . metoprolol succinate (TOPROL-XL) 50 MG 24 hr tablet Take 1 tablet (50 mg total) by mouth daily. 30 tablet 6  . naproxen (NAPROSYN) 500 MG tablet Take 500 mg by mouth daily.     . nitroGLYCERIN (NITROSTAT) 0.4 MG SL tablet Place 1 tablet (0.4 mg total) under the tongue every 5 (five) minutes as needed for chest pain. 25 tablet 0  . omeprazole (PRILOSEC) 20 MG capsule Take 20 mg by mouth daily.    . ranolazine (RANEXA) 500  MG 12 hr tablet Take 1 tablet (500 mg total) by mouth 2 (two) times daily. 180 tablet 3  . SUMAtriptan (IMITREX) 50 MG tablet Take 1 tablet by mouth as directed.  0  . tamsulosin (FLOMAX) 0.4 MG CAPS capsule Take 0.4 mg by mouth daily.     . Vitamin D, Ergocalciferol, (DRISDOL) 50000 UNITS CAPS capsule Take 1 capsule by mouth 2 (two) times a week.  5   No current facility-administered medications for this visit.     Past Surgical History  Procedure Laterality Date  . Coronary artery catheterization with stenting N/A 02-2102 AND 05-2012  . Left forearm surgery      s/p machine accident  . Trigger finger release      right 5th digit  . Coronary angioplasty    . Colonoscopy with propofol N/A 03/23/2015    Procedure: COLONOSCOPY WITH  PROPOFOL;  Surgeon: Daneil Dolin, MD;  Location: [facility];  Service: Endoscopy;  Laterality: N/A;  Cecum time in 0849  time out  0959  total time 10 mintues  . Polypectomy N/A 03/23/2015    Procedure: POLYPECTOMY;  Surgeon: Daneil Dolin, MD;  Location: [facility];  Service: Endoscopy;  Laterality: N/A;  cecal, descending colon  . Cardiac catheterization N/A 04/22/2015    Procedure: Left Heart Cath and Coronary Angiography;  Surgeon: Burnell Blanks, MD;  Location: [place] [facility];  Service: Cardiovascular;  Laterality: N/A;  . Cardiac catheterization N/A 04/22/2015    Procedure: Intravascular Pressure Wire/FFR Study;  Surgeon: Burnell Blanks, MD;  Location: [place] [facility];  Service: Cardiovascular;  Laterality: N/A;     Allergies  Allergen Reactions  . Bee Venom Swelling      Family History  Problem Relation Age of Onset  . Hypertension Mother   . Heart disease Mother   . Cirrhosis Father     DIED AT AGE 52  . Breast cancer Paternal Grandmother     DECEASED  . Colon cancer Neg Hx      Social History Larry Pham reports that he has been smoking Cigarettes.  He started smoking about 37 years ago. He has a 30 pack-year smoking history. He has never used smokeless tobacco. Larry Pham reports that he drinks alcohol.   Review of Systems CONSTITUTIONAL: No weight loss, fever, chills, weakness or fatigue.  HEENT: Eyes: No visual loss, blurred vision, double vision or yellow sclerae.No hearing loss, sneezing, congestion, runny nose or sore throat.  SKIN: No rash or itching.  CARDIOVASCULAR: per HPI RESPIRATORY: No shortness of breath, cough or sputum.  GASTROINTESTINAL: No anorexia, nausea, vomiting or diarrhea. No abdominal pain or blood.  GENITOURINARY: No burning on urination, no polyuria NEUROLOGICAL: No headache, dizziness, syncope, paralysis, ataxia, numbness or tingling in the extremities. No change in bowel or bladder control.  MUSCULOSKELETAL:leg  pains LYMPHATICS: No enlarged nodes. No history of splenectomy.  PSYCHIATRIC: No history of depression or anxiety.  ENDOCRINOLOGIC: No reports of sweating, cold or heat intolerance. No polyuria or polydipsia.  Marland Kitchen   Physical Examination Filed Vitals:   06/09/15 1458  BP: 99/66  Pulse: 88   Filed Vitals:   06/09/15 1458  Height: 5\' 11"  (1.803 m)  Weight: 253 lb (114.76 kg)    Gen: resting comfortably, no acute distress HEENT: no scleral icterus, pupils equal round and reactive, no palptable cervical adenopathy,  CV: RRR, no m/r/g, no jvd Resp: Clear to auscultation bilaterally GI: abdomen is soft, non-tender, non-distended, normal bowel sounds, no  hepatosplenomegaly MSK: extremities are warm, no edema.  Skin: warm, no rash Neuro:  no focal deficits Psych: appropriate affect   Diagnostic Studies 05/2013 MPI Analysis of the raw data showed no increased extracardiac radiotracer uptake. Analysis of the perfusion images showed a small mild fixed mid inferior wall defect. Left ventricular cavity size was normal. Regional wall motion was normal, indicating that the defect was due to soft tissue attenuation artifact. Left ventricular systolic function was calculated to be mildly reduced, EF 42%. However, systolic function appeared to be normal with an approximated EF of 50%.  IMPRESSION: 1. Normal myocardial perfusion imaging. No evidence of ischemia or scar.  2. Left ventricular systolic function was calculated to be mildly reduced, EF 42%. However, systolic function appeared to be grossly normal with an approximated EF of 50%.   05/2012 Cath at [facility] Left [facility] Cardiac Catheterization Results: Coronary arteries: Left main: 40% distal Left anterior descending: 30% ostial, scattered irregs Left circumflex: 40% mid, scattered irregs Right coronary: widely patent proximal stent, 70% mid, 40% at angle, diffuse irregs FFR of left main into LAD: 0.81  PCI Percutaneous  coronary intervention performed of the mid RCA lesion which was stented with a 3.0 mm x 53mm Promus DES Results: 70% to normal  INTERVENTIONAL IMPRESSIONS and RECOMMENDATIONS: Successful PCI of the mid RCA lesion Aspirin 81 mg daily indefinitely Ticagrelor (Brilinta) 90 mg PO BID for at least 12 months Risk factor modification, smoking cessation  12/2014 Lexiscan MPI  This is a low risk study.  The left ventricular ejection fraction is mildly decreased (45-54%).  Small mild inferoseptal defect with mild reversibility. Differences in attenuation verse small area of ischemia, overall low risk finding.    04/2015 Carotid US IMPRESSION: No hemodynamically significant stenosis or plaque is noted in either cervical carotid artery.  04/2015 Cath  Ost 1st Mrg to 1st Mrg lesion, 50% stenosed.  1. Moderate left main stenosis, not flow limiting by FFR (FFR of 0.93) 2. Patent stents mid RCA with minimal restenosis.  3. Moderate distal RCA stenosis.  4. Moderate ostial LAD stenosis.  5. Severe stenosis small caliber distal AV groove Circumflex beyond the takeoff of the large OM Anthonio Mizzell. (too small for PCI) 6. Normal LV systolic function  Recommendations: He has moderate disease as described above with severe disease in the small caliber distal Circumflex. This vessel is too small for PCI. Continue medical management.    04/2015 Echo Study Conclusions  - Left ventricle: The cavity size was normal. Wall thickness was increased in a pattern of mild LVH. Systolic function was normal. The estimated ejection fraction was in the range of 50% to 55%. Features are consistent with a pseudonormal left ventricular filling pattern, with concomitant abnormal relaxation and increased filling pressure (grade 2 diastolic dysfunction). Doppler parameters are consistent with high ventricular filling pressure. - Regional wall motion abnormality: Mild hypokinesis of the  mid inferoseptal, mid inferior, and mid inferolateral myocardium. - Aortic valve: Mildly calcified annulus. Trileaflet. - Mitral valve: Calcified annulus.  Assessment and Plan   1. CAD - prior interventions on RCA x 2, most recent at [facility] with DES placement. Of note he also has 40-50% left main disease - reports recent increase in chest pains. Stress test 12/2014 low risk, though question of attenuation vs small area of ischemia. - 04/2015 cath with moderate disease, severe small vessel disease with recs for medicaal management - we will increase ranexa to 1000mg  bid.   2. Leg pains - suggestive of  claudication, f/u results of LE arterial studies that are already ordered.   3. Hyperlipidemia  - continue high dose statin in setting of known CAD   4. HTN  -at goal, continue current meds  5. Tobacco abuse - counseled extensively on health risk, recommended to stop smoking.    F/u 6 months Arnoldo Lenis, M.D.

## 2015-06-09 NOTE — Patient Instructions (Addendum)
   Increase Ranexa to 1,000mg  twice a day  - may take 2 of your 500mg  tabs twice a day till finish current supply.  New sent to [place] today. Continue all other medications.   Your physician wants you to follow up in: 6 months.  You will receive a reminder letter in the mail one-two months in advance.  If you don't receive a letter, please call our office to schedule the follow up appointment

## 2015-06-11 ENCOUNTER — Encounter: Payer: Self-pay | Admitting: Cardiology

## 2015-06-17 ENCOUNTER — Ambulatory Visit: Payer: BLUE CROSS/BLUE SHIELD

## 2015-06-17 ENCOUNTER — Emergency Department (HOSPITAL_COMMUNITY)
Admission: EM | Admit: 2015-06-17 | Discharge: 2015-06-17 | Disposition: A | Discharge status: Home / Self Care | Payer: BLUE CROSS/BLUE SHIELD | Attending: Emergency Medicine | Admitting: Emergency Medicine

## 2015-06-17 ENCOUNTER — Encounter (HOSPITAL_COMMUNITY): Payer: Self-pay | Admitting: Emergency Medicine

## 2015-06-17 DIAGNOSIS — Z8669 Personal history of other diseases of the nervous system and sense organs: Secondary | ICD-10-CM | POA: Insufficient documentation

## 2015-06-17 DIAGNOSIS — E785 Hyperlipidemia, unspecified: Secondary | ICD-10-CM | POA: Diagnosis not present

## 2015-06-17 DIAGNOSIS — I739 Peripheral vascular disease, unspecified: Secondary | ICD-10-CM | POA: Insufficient documentation

## 2015-06-17 DIAGNOSIS — Z9861 Coronary angioplasty status: Secondary | ICD-10-CM | POA: Insufficient documentation

## 2015-06-17 DIAGNOSIS — I10 Essential (primary) hypertension: Secondary | ICD-10-CM | POA: Diagnosis not present

## 2015-06-17 DIAGNOSIS — K219 Gastro-esophageal reflux disease without esophagitis: Secondary | ICD-10-CM | POA: Diagnosis not present

## 2015-06-17 DIAGNOSIS — Z7982 Long term (current) use of aspirin: Secondary | ICD-10-CM | POA: Insufficient documentation

## 2015-06-17 DIAGNOSIS — M79604 Pain in right leg: Secondary | ICD-10-CM | POA: Diagnosis present

## 2015-06-17 DIAGNOSIS — Z7902 Long term (current) use of antithrombotics/antiplatelets: Secondary | ICD-10-CM | POA: Diagnosis not present

## 2015-06-17 DIAGNOSIS — R0989 Other specified symptoms and signs involving the circulatory and respiratory systems: Secondary | ICD-10-CM

## 2015-06-17 DIAGNOSIS — M79606 Pain in leg, unspecified: Secondary | ICD-10-CM | POA: Diagnosis not present

## 2015-06-17 DIAGNOSIS — Z9889 Other specified postprocedural states: Secondary | ICD-10-CM | POA: Insufficient documentation

## 2015-06-17 DIAGNOSIS — I2510 Atherosclerotic heart disease of native coronary artery without angina pectoris: Secondary | ICD-10-CM | POA: Diagnosis not present

## 2015-06-17 DIAGNOSIS — F17210 Nicotine dependence, cigarettes, uncomplicated: Secondary | ICD-10-CM | POA: Insufficient documentation

## 2015-06-17 DIAGNOSIS — Z79899 Other long term (current) drug therapy: Secondary | ICD-10-CM | POA: Diagnosis not present

## 2015-06-17 DIAGNOSIS — I252 Old myocardial infarction: Secondary | ICD-10-CM | POA: Diagnosis not present

## 2015-06-17 DIAGNOSIS — Z791 Long term (current) use of non-steroidal anti-inflammatories (NSAID): Secondary | ICD-10-CM | POA: Diagnosis not present

## 2015-06-17 LAB — BASIC METABOLIC PANEL
ANION GAP: 10 (ref 5–15)
BUN: 23 mg/dL — ABNORMAL HIGH (ref 6–20)
CO2: 26 mmol/L (ref 22–32)
Calcium: 9.8 mg/dL (ref 8.9–10.3)
Chloride: 101 mmol/L (ref 101–111)
Creatinine, Ser: 1.48 mg/dL — ABNORMAL HIGH (ref 0.61–1.24)
GFR calc Af Amer: >60 mL/min (ref >60)
GFR, EST NON AFRICAN AMERICAN: 53 mL/min — AB (ref >60)
GLUCOSE: 94 mg/dL (ref 65–99)
POTASSIUM: 4 mmol/L (ref 3.5–5.1)
Sodium: 137 mmol/L (ref 135–145)

## 2015-06-17 LAB — CBC
HEMATOCRIT: 44.6 % (ref 39.0–52.0)
HEMOGLOBIN: 14.8 g/dL (ref 13.0–17.0)
MCH: 30.6 pg (ref 26.0–34.0)
MCHC: 33.2 g/dL (ref 30.0–36.0)
MCV: 92.1 fL (ref 78.0–100.0)
Platelets: 261 10*3/uL (ref 150–400)
RBC: 4.84 MIL/uL (ref 4.22–5.81)
RDW: 13.6 % (ref 11.5–15.5)
WBC: 8.4 10*3/uL (ref 4.0–10.5)

## 2015-06-17 LAB — PROTIME-INR
INR: 1.1 (ref 0.00–1.49)
Prothrombin Time: 14.4 seconds (ref 11.6–15.2)

## 2015-06-17 LAB — I-STAT TROPONIN, ED: Troponin i, poc: 0.01 ng/mL (ref 0.00–0.08)

## 2015-06-17 MED ORDER — CLOPIDOGREL BISULFATE 75 MG PO TABS
75.0000 mg | ORAL_TABLET | Freq: Once | ORAL | Status: AC
  Administered 2015-06-17: 75 mg via ORAL
  Filled 2015-06-17: qty 1

## 2015-06-17 MED ORDER — CLOPIDOGREL BISULFATE 75 MG PO TABS: 75.0000 mg | ORAL_TABLET | Freq: Once | ORAL | Status: DC

## 2015-06-17 NOTE — Discharge Instructions (Signed)
You were evaluated for lower extremity vascular claudication. We do not have results of your lower leg ultrasounds at this time, as advised these won't be available until later this week. We discussed with Dr Terrence Dupont on call for Dr Montez Morita. Recommended to start Plavix (clopidogrel) 75mg  daily, you were given one pill tonight, and then recommend taking ONCE daily starting tomorrow Thursday 06/18/15. CONTINUE taking your Aspirin 81mg  daily. Very important to remain off of cigarettes as these will worsen your vascular disease.  Today there was no evidence of blood clot in legs, or loss of pulses, no evidence of ulceration on your feet. No further testing today.  Important to follow-up with Dr Montez Morita next week on Tues 06/23/15 to discuss future surgical options and treatment.  If you develop symptoms at REST while not walking including lose pulses in a foot, persistent numbness, cold foot or persistent pain or a sore that doesn't heal.

## 2015-06-17 NOTE — ED Notes (Addendum)
The patient went to Dr. Montez Morita a cardiovascular doctor and was advised he had "blockages" in both his legs.  The patient was experiencing numbness and tingling in both his feet and both his right calf and left calf.  The patient is rating his pain 2/10.  If he walks it shoots up to a 10/10.

## 2015-06-17 NOTE — ED Provider Notes (Signed)
CSN: [id number]     Arrival date & time 06/17/15  1934 History   First MD Initiated Contact with Patient 06/17/15 2214     Chief Complaint  Patient presents with  . Leg Pain    The patient went to Dr. Montez Morita a cardiovascular doctor and was advised he had "blockages" in both his legs.  The patient was experiencing numbness and tingling in both his feet and right calf and left calf.   History provided by patient.  (Consider location/radiation/quality/duration/timing/severity/associated sxs/prior Treatment) HPI   Reports symptoms started gradual worsening over past 5 years with Right thigh burning and numbness only with walking, initially could walk longer distances before onset symptoms, then gradual worsening over the years. Significant worsening about 2 months ago with bilateral calf cramping, Right thigh burning sensation with some intermittent numbness, also with numbness in toes, symptoms onset only with ambulation short to moderate distances usually after similar distance, has to stop to rest due to pain with improvement 5-10 min later. Additionally had some episodes with low back pain, with some radiating pain in both legs, has not had imaging of lower back. Followed up with Neurology did peripheral nerve conduction testing was normal in both legs. He was advised symptoms likely vascular in lower legs, has followed with Cardiology, Dr Montez Morita and Dr [name] with known history of CAD s/p STEMI with multiple stents, on ASA 81 daily previously on other anti-platelet agents since discontinued. Active smoker, cut back and trying to quit. Today he had bilateral lower extremity vascular US done that were ordered by Dr Montez Morita, advised by the tech during exam that there may be some blockages, awaiting official read, Dr Tonita Phoenix office has scheduled him for follow-up 2/14 for pre-op to discuss peripheral stent placement in legs, he was advised to come to ED since their office was closed and is requesting  work note.  Denies any active CP, SOB, HA, lightheadedness, leg swelling, calf redness, cold foot, active numbness / tingling or reduced pulses at rest.  PMH MI in 2013. Denies any prior history of DVT / PE.  Past Medical History  Diagnosis Date  . Hypertension   . Carpal tunnel syndrome   . GERD (gastroesophageal reflux disease)   . Seasonal allergies   . History of ETOH abuse     STOPPED 9 MONTHS AGO  . Hyperlipidemia   . MI (myocardial infarction) ([place]) 02-2012  . CAD (coronary artery disease)   . Coronary atherosclerosis of artery bypass graft    Past Surgical History  Procedure Laterality Date  . Coronary artery catheterization with stenting N/A 02-2102 AND 05-2012  . Left forearm surgery      s/p machine accident  . Trigger finger release      right 5th digit  . Coronary angioplasty    . Colonoscopy with propofol N/A 03/23/2015    Procedure: COLONOSCOPY WITH PROPOFOL;  Surgeon: Daneil Dolin, MD;  Location: [facility];  Service: Endoscopy;  Laterality: N/A;  Cecum time in 0849  time out  0959  total time 10 mintues  . Polypectomy N/A 03/23/2015    Procedure: POLYPECTOMY;  Surgeon: Daneil Dolin, MD;  Location: [facility];  Service: Endoscopy;  Laterality: N/A;  cecal, descending colon  . Cardiac catheterization N/A 04/22/2015    Procedure: Left Heart Cath and Coronary Angiography;  Surgeon: Burnell Blanks, MD;  Location: [place] [facility];  Service: Cardiovascular;  Laterality: N/A;  . Cardiac catheterization N/A 04/22/2015    Procedure: Intravascular  Pressure Wire/FFR Study;  Surgeon: Burnell Blanks, MD;  Location: [place] [facility];  Service: Cardiovascular;  Laterality: N/A;   Family History  Problem Relation Age of Onset  . Hypertension Mother   . Heart disease Mother   . Cirrhosis Father     DIED AT AGE 68  . Breast cancer Paternal Grandmother     DECEASED  . Colon cancer Neg Hx    Social History  Substance Use Topics  . Smoking status: Current  Every Day Smoker -- 1.00 packs/day for 30 years    Types: Cigarettes    Start date: 11/22/1977  . Smokeless tobacco: Never Used     Comment: one pack daily  . Alcohol Use: 0.0 oz/week    0 Standard drinks or equivalent per week     Comment: 5-6 beers a day.    Review of Systems  See above HPI  Allergies  Bee venom  Home Medications   Prior to Admission medications   Medication Sig Start Date End Date Taking? Authorizing Provider  alfuzosin (UROXATRAL) 10 MG 24 hr tablet Take 1 tablet by mouth daily. 05/08/15  Yes Historical Provider, MD  aspirin EC 81 MG tablet Take 81 mg by mouth every morning.   Yes Historical Provider, MD  atorvastatin (LIPITOR) 80 MG tablet Take 80 mg by mouth daily.   Yes Historical Provider, MD  cetirizine (ZYRTEC) 10 MG tablet Take 10 mg by mouth daily as needed for allergies.  02/02/15  Yes Historical Provider, MD  gabapentin (NEURONTIN) 300 MG capsule Take 300 mg by mouth at bedtime.   Yes Historical Provider, MD  hydrochlorothiazide (HYDRODIURIL) 25 MG tablet Take 25 mg by mouth daily.   Yes Historical Provider, MD  isosorbide mononitrate (IMDUR) 30 MG 24 hr tablet Take 30 mg by mouth daily.   Yes Historical Provider, MD  lisinopril (PRINIVIL,ZESTRIL) 40 MG tablet Take 40 mg by mouth daily.   Yes Historical Provider, MD  metoprolol succinate (TOPROL-XL) 25 MG 24 hr tablet Take 25 mg by mouth daily.   Yes Historical Provider, MD  naproxen (NAPROSYN) 500 MG tablet Take 500 mg by mouth daily.    Yes Historical Provider, MD  nitroGLYCERIN (NITROSTAT) 0.4 MG SL tablet Place 1 tablet (0.4 mg total) under the tongue every 5 (five) minutes as needed for chest pain. 06/04/15  Yes Arnoldo Lenis, MD  omeprazole (PRILOSEC) 20 MG capsule Take 20 mg by mouth daily.   Yes Historical Provider, MD  ranolazine (RANEXA) 1000 MG SR tablet Take 1 tablet (1,000 mg total) by mouth 2 (two) times daily. 06/09/15  Yes Arnoldo Lenis, MD  SUMAtriptan (IMITREX) 50 MG tablet Take  1 tablet by mouth every 2 (two) hours as needed for migraine or headache.  02/02/15  Yes Historical Provider, MD  tamsulosin (FLOMAX) 0.4 MG CAPS capsule Take 0.4 mg by mouth daily.   Yes Historical Provider, MD  Vitamin D, Ergocalciferol, (DRISDOL) 50000 UNITS CAPS capsule Take 1 capsule by mouth 2 (two) times a week. 02/02/15  Yes Historical Provider, MD  albuterol (PROVENTIL HFA;VENTOLIN HFA) 108 (90 BASE) MCG/ACT inhaler Inhale 2 puffs into the lungs every 6 (six) hours as needed for wheezing or shortness of breath. Patient not taking: Reported on 06/17/2015 04/13/15   Arnoldo Lenis, MD  clopidogrel (PLAVIX) 75 MG tablet Take 1 tablet (75 mg total) by mouth once. 06/18/15   Olin Hauser, DO  metoprolol succinate (TOPROL-XL) 50 MG 24 hr tablet Take 1 tablet (  50 mg total) by mouth daily. Patient not taking: Reported on 06/17/2015 12/11/14   Arnoldo Lenis, MD   BP 121/75 mmHg  Pulse 65  Temp(Src) 98 F (36.7 C) (Oral)  Resp 21  Ht 5\' 11"  (1.803 m)  Wt 113.807 kg  BMI 35.01 kg/m2  SpO2 100% Physical Exam  Constitutional: He is oriented to person, place, and time. He appears well-developed and well-nourished. No distress.  Well-appearing, comfortable, cooperative  HENT:  Head: Normocephalic and atraumatic.  Mouth/Throat: Oropharynx is clear and moist.  Eyes: Conjunctivae and EOM are normal. Pupils are equal, round, and reactive to light.  Neck: Normal range of motion. Neck supple.  No carotid bruits  Cardiovascular: Normal rate, regular rhythm, normal heart sounds and intact distal pulses.   No murmur heard. Pulmonary/Chest: Effort normal and breath sounds normal. No respiratory distress. He has no wheezes. He has no rales.  Abdominal: Soft. Bowel sounds are normal. He exhibits no distension and no mass. There is no tenderness.  Musculoskeletal: Normal range of motion. He exhibits no edema.  Bilateral Feet / Lower Ext Inspection: Normal appearance, symmetrical calves, no  erythema or edema, no foot ulcerations. Palpation: Warm to touch, mild soreness to left calf ROM: full active ROM Strength: 5/5 ankle dorsi/plantarflexion Neurovascular: distal bilateral lower extremities intact, +1 pulses post tib / dp on left foot vs +1 to +2 on Right foot   Neurological: He is alert and oriented to person, place, and time.  Distal sensation to light touch intact bilateral lower extremities.  Skin: Skin is warm and dry. No rash noted. He is not diaphoretic. No erythema.  Psychiatric: He has a normal mood and affect. His behavior is normal.  Nursing note and vitals reviewed.   ED Course  Procedures (including critical care time) Labs Review Labs Reviewed  BASIC METABOLIC PANEL - Abnormal; Notable for the following:    BUN 23 (*)    Creatinine, Ser 1.48 (*)    GFR calc non Af Amer 53 (*)    All other components within normal limits  CBC  PROTIME-INR  I-STAT TROPOININ, ED    Imaging Review No results found. I have personally reviewed and evaluated these images and lab results as part of my medical decision-making.   EKG Interpretation None      MDM   Final diagnoses:  Peripheral vascular disease with claudication ([place])   80 yr AAM with PMH CAD s/p NSTEMI and stents, HTN, HLD, PVD presents with gradual worsening chronic bilateral vascular claudication L>R symptoms. Clinically today without acute change in symptoms, still persistent claudication on ambulation only without symptoms at rest, both feet warm, pulses present (mildly reduced L compared to Right), no ulceration or evidence of gangrene, clinically no sign of DVT (no prior history). Hemodynamically stable. Followed by Cardiology Dr [name] and Dr Montez Morita, had vascular US today advised prelim read concern for some arterial blockages, scheduled for next follow-up 06/23/15 with Dr Montez Morita to discuss potential pre-op for future lower extremity PVD stent treatment.  Initial work-up with labs unremarkable.  Discussed case with on-call Dr Terrence Dupont (covering Dr Montez Morita), he advised given no acute sign of DVT, recommend start daily Plavix 75mg  (first dose in ED today), continue ASA 81mg  daily. Patient has been on DAPT in past with stent placement, no contraindications to plavix, no prior intracranial hemorrhage or PUD. Discussed potential inc bleeding risks, patient understands, given rx plavix x 10 days, work note out til 2/15 follow-up next work, return criteria given, stable  for discharge.    Olin Hauser, DO 06/17/15 [id number]  Leonard Schwartz, MD 06/18/15 2249

## 2015-06-23 ENCOUNTER — Ambulatory Visit (INDEPENDENT_AMBULATORY_CARE_PROVIDER_SITE_OTHER): Payer: BLUE CROSS/BLUE SHIELD | Admitting: Cardiovascular Disease

## 2015-06-23 ENCOUNTER — Encounter: Payer: Self-pay | Admitting: Cardiovascular Disease

## 2015-06-23 VITALS — BP 100/70 | HR 80 | Ht 71.5 in | Wt 251.3 lb

## 2015-06-23 DIAGNOSIS — E785 Hyperlipidemia, unspecified: Secondary | ICD-10-CM

## 2015-06-23 DIAGNOSIS — Z720 Tobacco use: Secondary | ICD-10-CM

## 2015-06-23 DIAGNOSIS — I739 Peripheral vascular disease, unspecified: Secondary | ICD-10-CM | POA: Diagnosis not present

## 2015-06-23 NOTE — Assessment & Plan Note (Addendum)
The patient has severe left calf claudication which is currently lifestyle limiting. He also has right calf claudication which is milder. The symptoms are forced him to stop working. I discussed different management options with him and given that his claudication is currently severe, I recommend proceeding with abdominal aortogram with lower extremity runoff and possible endovascular intervention. I discussed risks and benefits with him. He was already started on Plavix in the emergency room. Continue treatment of risk factors.  He was given a note to stay off for for one week after the angiogram.

## 2015-06-23 NOTE — Assessment & Plan Note (Signed)
Continue high dose atorvastatin at target LDL of less than 70.

## 2015-06-23 NOTE — Patient Instructions (Signed)
Medication Instructions:  Your physician recommends that you continue on your current medications as directed. Please refer to the Current Medication list given to you today.  Labwork: No new orders.   Testing/Procedures: Your physician has requested that you have a peripheral vascular angiogram. This exam is performed at the hospital. During this exam IV contrast is used to look at arterial blood flow. Please review the information sheet given for details.  Follow-Up: Your physician recommends that you schedule a follow-up appointment in: 3 WEEKS with Dr Fletcher Anon   Any Other Special Instructions Will Be Listed Below (If Applicable).     If you need a refill on your cardiac medications before your next appointment, please call your pharmacy.

## 2015-06-23 NOTE — Progress Notes (Signed)
Primary care physician: Dr. Montez Morita Primary cardiologist: Dr. [name]  HPI  This is a 50 year old male who was referred by Dr. Montez Morita and Dr. [name] for evaluation and management of peripheral arterial disease. He has known history of coronary artery disease status post RCA PCI in the setting of inferior ST elevation myocardial infarction. He had cardiac catheterization done recently in December 2016 which showed moderate left main stenosis which was not significant by FFR, patent RCA stents and severe stenosis in the small distal left circumflex which was left to be treated medically. He Currently denies chest pain or shortness of breath. He has known history of hyperlipidemia and tobacco use. Over the last few months, he has experienced progressive bilateral calf pain with walking which is worse on the left side than the right side. This is currently happening with walking about half a block and has forced him to take time off his work. He works as a Museum/gallery curator. He has no rest pain or lower extremity ulceration. He is trying to quit smoking and currently is down to half a pack per day. He has family history of coronary artery disease. He underwent noninvasive vascular evaluation which showed normal ABI on the right side and moderately reduced on the left side with evidence of severe left SFA stenosis and borderline significant right SFA stenosis. He went to the emergency room recently due to worsening symptoms. He does not have symptoms at rest.    Allergies  Allergen Reactions  . Bee Venom Swelling     Current Outpatient Prescriptions on File Prior to Visit  Medication Sig Dispense Refill  . albuterol (PROVENTIL HFA;VENTOLIN HFA) 108 (90 BASE) MCG/ACT inhaler Inhale 2 puffs into the lungs every 6 (six) hours as needed for wheezing or shortness of breath. 1 Inhaler 2  . alfuzosin (UROXATRAL) 10 MG 24 hr tablet Take 1 tablet by mouth daily.  11  . aspirin EC 81 MG tablet Take 81 mg by mouth  every morning.    Marland Kitchen atorvastatin (LIPITOR) 80 MG tablet Take 80 mg by mouth daily.    . cetirizine (ZYRTEC) 10 MG tablet Take 10 mg by mouth daily as needed for allergies.   12  . clopidogrel (PLAVIX) 75 MG tablet Take 1 tablet (75 mg total) by mouth once. 10 tablet 0  . gabapentin (NEURONTIN) 300 MG capsule Take 300 mg by mouth at bedtime.    . hydrochlorothiazide (HYDRODIURIL) 25 MG tablet Take 25 mg by mouth daily.    . isosorbide mononitrate (IMDUR) 30 MG 24 hr tablet Take 30 mg by mouth daily.    Marland Kitchen lisinopril (PRINIVIL,ZESTRIL) 40 MG tablet Take 40 mg by mouth daily.    . metoprolol succinate (TOPROL-XL) 50 MG 24 hr tablet Take 1 tablet (50 mg total) by mouth daily. 30 tablet 6  . naproxen (NAPROSYN) 500 MG tablet Take 500 mg by mouth daily.     . nitroGLYCERIN (NITROSTAT) 0.4 MG SL tablet Place 1 tablet (0.4 mg total) under the tongue every 5 (five) minutes as needed for chest pain. 25 tablet 0  . omeprazole (PRILOSEC) 20 MG capsule Take 20 mg by mouth daily.    . ranolazine (RANEXA) 1000 MG SR tablet Take 1 tablet (1,000 mg total) by mouth 2 (two) times daily. 60 tablet 6  . SUMAtriptan (IMITREX) 50 MG tablet Take 1 tablet by mouth every 2 (two) hours as needed for migraine or headache.   0  . tamsulosin (FLOMAX) 0.4 MG CAPS capsule Take 0.4  mg by mouth daily.    . Vitamin D, Ergocalciferol, (DRISDOL) 50000 UNITS CAPS capsule Take 1 capsule by mouth 2 (two) times a week.  5   No current facility-administered medications on file prior to visit.     Past Medical History  Diagnosis Date  . Hypertension   . Carpal tunnel syndrome   . GERD (gastroesophageal reflux disease)   . Seasonal allergies   . History of ETOH abuse     STOPPED 9 MONTHS AGO  . Hyperlipidemia   . MI (myocardial infarction) ([place]) 02-2012  . CAD (coronary artery disease)   . Coronary atherosclerosis of artery bypass graft      Past Surgical History  Procedure Laterality Date  . Coronary artery  catheterization with stenting N/A 02-2102 AND 05-2012  . Left forearm surgery      s/p machine accident  . Trigger finger release      right 5th digit  . Coronary angioplasty    . Colonoscopy with propofol N/A 03/23/2015    Procedure: COLONOSCOPY WITH PROPOFOL;  Surgeon: Daneil Dolin, MD;  Location: [facility];  Service: Endoscopy;  Laterality: N/A;  Cecum time in 0849  time out  0959  total time 10 mintues  . Polypectomy N/A 03/23/2015    Procedure: POLYPECTOMY;  Surgeon: Daneil Dolin, MD;  Location: [facility];  Service: Endoscopy;  Laterality: N/A;  cecal, descending colon  . Cardiac catheterization N/A 04/22/2015    Procedure: Left Heart Cath and Coronary Angiography;  Surgeon: Burnell Blanks, MD;  Location: [facility];  Service: Cardiovascular;  Laterality: N/A;  . Cardiac catheterization N/A 04/22/2015    Procedure: Intravascular Pressure Wire/FFR Study;  Surgeon: Burnell Blanks, MD;  Location: [facility];  Service: Cardiovascular;  Laterality: N/A;     Family History  Problem Relation Age of Onset  . Hypertension Mother   . Heart disease Mother   . Cirrhosis Father     DIED AT AGE 39  . Breast cancer Paternal Grandmother     DECEASED  . Colon cancer Neg Hx      Social History   Social History  . Marital Status: Legally Separated    Spouse Name: N/A  . Number of Children: N/A  . Years of Education: N/A   Occupational History  . Not on file.   Social History Main Topics  . Smoking status: Current Every Day Smoker -- 1.00 packs/day for 30 years    Types: Cigarettes    Start date: 11/22/1977  . Smokeless tobacco: Never Used     Comment: one pack daily  . Alcohol Use: 0.0 oz/week    0 Standard drinks or equivalent per week     Comment: 5-6 beers a day.  . Drug Use: No  . Sexual Activity: Not on file   Other Topics Concern  . Not on file   Social History Narrative     ROS A 10 point review of system was performed. It is negative  other than that mentioned in the history of present illness.   PHYSICAL EXAM   BP 100/70 mmHg  Pulse 80  Ht 5' 11.5" (1.816 m)  Wt 251 lb 4.8 oz (113.989 kg)  BMI 34.56 kg/m2 Constitutional: He is oriented to person, place, and time. He appears well-developed and well-nourished. No distress.  HENT: No nasal discharge.  Head: Normocephalic and atraumatic.  Eyes: Pupils are equal and round.  No discharge. Neck: Normal range of motion. Neck  supple. No JVD present. No thyromegaly present.  Cardiovascular: Normal rate, regular rhythm, normal heart sounds. Exam reveals no gallop and no friction rub. No murmur heard.  Pulmonary/Chest: Effort normal and breath sounds normal. No stridor. No respiratory distress. He has no wheezes. He has no rales. He exhibits no tenderness.  Abdominal: Soft. Bowel sounds are normal. He exhibits no distension. There is no tenderness. There is no rebound and no guarding.  Musculoskeletal: Normal range of motion. He exhibits no edema and no tenderness.  Neurological: He is alert and oriented to person, place, and time. Coordination normal.  Skin: Skin is warm and dry. No rash noted. He is not diaphoretic. No erythema. No pallor.  Psychiatric: He has a normal mood and affect. His behavior is normal. Judgment and thought content normal.  Vascular: Femoral pulses are normal bilaterally. Posterior tibial is palpable on the right side but not the left side. Dorsalis pedis is not palpable bilaterally.     ASSESSMENT AND PLAN

## 2015-06-23 NOTE — Assessment & Plan Note (Signed)
I had a prolonged discussion with him about the association of tobacco use and peripheral arterial disease and the risk of progression to critical limb ischemia. I strongly advised him to quit smoking completely.

## 2015-07-01 ENCOUNTER — Encounter (HOSPITAL_COMMUNITY)
Admission: RE | Disposition: A | Discharge status: Home / Self Care | Payer: Self-pay | Source: Ambulatory Visit | Attending: Cardiovascular Disease

## 2015-07-01 ENCOUNTER — Ambulatory Visit (HOSPITAL_COMMUNITY)
Admission: RE | Admit: 2015-07-01 | Discharge: 2015-07-01 | Disposition: A | Discharge status: Home / Self Care | Payer: BLUE CROSS/BLUE SHIELD | Source: Ambulatory Visit | Attending: Cardiovascular Disease | Admitting: Cardiovascular Disease

## 2015-07-01 ENCOUNTER — Other Ambulatory Visit: Payer: Self-pay

## 2015-07-01 ENCOUNTER — Telehealth: Payer: Self-pay | Admitting: Cardiovascular Disease

## 2015-07-01 DIAGNOSIS — Z7982 Long term (current) use of aspirin: Secondary | ICD-10-CM | POA: Diagnosis not present

## 2015-07-01 DIAGNOSIS — I70213 Atherosclerosis of native arteries of extremities with intermittent claudication, bilateral legs: Secondary | ICD-10-CM | POA: Insufficient documentation

## 2015-07-01 DIAGNOSIS — E785 Hyperlipidemia, unspecified: Secondary | ICD-10-CM | POA: Insufficient documentation

## 2015-07-01 DIAGNOSIS — I739 Peripheral vascular disease, unspecified: Secondary | ICD-10-CM

## 2015-07-01 DIAGNOSIS — Z8249 Family history of ischemic heart disease and other diseases of the circulatory system: Secondary | ICD-10-CM | POA: Insufficient documentation

## 2015-07-01 DIAGNOSIS — I10 Essential (primary) hypertension: Secondary | ICD-10-CM | POA: Diagnosis not present

## 2015-07-01 DIAGNOSIS — Z7902 Long term (current) use of antithrombotics/antiplatelets: Secondary | ICD-10-CM | POA: Diagnosis not present

## 2015-07-01 DIAGNOSIS — I25810 Atherosclerosis of coronary artery bypass graft(s) without angina pectoris: Secondary | ICD-10-CM | POA: Diagnosis not present

## 2015-07-01 DIAGNOSIS — F17210 Nicotine dependence, cigarettes, uncomplicated: Secondary | ICD-10-CM | POA: Insufficient documentation

## 2015-07-01 DIAGNOSIS — Z955 Presence of coronary angioplasty implant and graft: Secondary | ICD-10-CM | POA: Diagnosis not present

## 2015-07-01 DIAGNOSIS — K219 Gastro-esophageal reflux disease without esophagitis: Secondary | ICD-10-CM | POA: Insufficient documentation

## 2015-07-01 DIAGNOSIS — I252 Old myocardial infarction: Secondary | ICD-10-CM | POA: Insufficient documentation

## 2015-07-01 HISTORY — PX: PERIPHERAL VASCULAR CATHETERIZATION: SHX172C

## 2015-07-01 HISTORY — PX: LOWER EXTREMITY ANGIOGRAM: SHX5508

## 2015-07-01 LAB — POCT ACTIVATED CLOTTING TIME
ACTIVATED CLOTTING TIME: 188 s
Activated Clotting Time: 260 seconds
Activated Clotting Time: 229 seconds

## 2015-07-01 SURGERY — ABDOMINAL AORTOGRAM
Anesthesia: LOCAL

## 2015-07-01 MED ORDER — FENTANYL CITRATE (PF) 100 MCG/2ML IJ SOLN
INTRAMUSCULAR | Status: AC
  Filled 2015-07-01: qty 2

## 2015-07-01 MED ORDER — LIDOCAINE HCL (PF) 1 % IJ SOLN
INTRAMUSCULAR | Status: AC
  Filled 2015-07-01: qty 30

## 2015-07-01 MED ORDER — ASPIRIN 81 MG PO CHEW
CHEWABLE_TABLET | ORAL | Status: AC
  Filled 2015-07-01: qty 1

## 2015-07-01 MED ORDER — HEPARIN SODIUM (PORCINE) 1000 UNIT/ML IJ SOLN
INTRAMUSCULAR | Status: AC
  Filled 2015-07-01: qty 1

## 2015-07-01 MED ORDER — MIDAZOLAM HCL 2 MG/2ML IJ SOLN
INTRAMUSCULAR | Status: DC | PRN
  Administered 2015-07-01: 1 mg via INTRAVENOUS

## 2015-07-01 MED ORDER — HEPARIN SODIUM (PORCINE) 1000 UNIT/ML IJ SOLN
INTRAMUSCULAR | Status: DC | PRN
  Administered 2015-07-01: 8000 [IU] via INTRAVENOUS

## 2015-07-01 MED ORDER — SODIUM CHLORIDE 0.9 % IV SOLN: 250.0000 mL | INTRAVENOUS | Status: DC | PRN

## 2015-07-01 MED ORDER — SODIUM CHLORIDE 0.9 % WEIGHT BASED INFUSION
3.0000 mL/kg/h | INTRAVENOUS | Status: DC
  Administered 2015-07-01: 3 mL/kg/h via INTRAVENOUS

## 2015-07-01 MED ORDER — SODIUM CHLORIDE 0.9% FLUSH: 3.0000 mL | Freq: Two times a day (BID) | INTRAVENOUS | Status: DC

## 2015-07-01 MED ORDER — HEPARIN (PORCINE) IN NACL 2-0.9 UNIT/ML-% IJ SOLN
INTRAMUSCULAR | Status: AC
  Filled 2015-07-01: qty 1000

## 2015-07-01 MED ORDER — IODIXANOL 320 MG/ML IV SOLN
INTRAVENOUS | Status: DC | PRN
  Administered 2015-07-01: 150 mL via INTRA_ARTERIAL

## 2015-07-01 MED ORDER — FENTANYL CITRATE (PF) 100 MCG/2ML IJ SOLN
INTRAMUSCULAR | Status: DC | PRN
  Administered 2015-07-01: 50 ug via INTRAVENOUS

## 2015-07-01 MED ORDER — MIDAZOLAM HCL 2 MG/2ML IJ SOLN
INTRAMUSCULAR | Status: AC
  Filled 2015-07-01: qty 2

## 2015-07-01 MED ORDER — SODIUM CHLORIDE 0.9% FLUSH: 3.0000 mL | INTRAVENOUS | Status: DC | PRN

## 2015-07-01 MED ORDER — SODIUM CHLORIDE 0.9 % IV SOLN: INTRAVENOUS | Status: DC

## 2015-07-01 MED ORDER — LIDOCAINE HCL (PF) 1 % IJ SOLN
INTRAMUSCULAR | Status: DC | PRN
  Administered 2015-07-01: 20 mL via SUBCUTANEOUS

## 2015-07-01 MED ORDER — HEPARIN (PORCINE) IN NACL 2-0.9 UNIT/ML-% IJ SOLN
INTRAMUSCULAR | Status: DC | PRN
  Administered 2015-07-01: 1000 mL via INTRA_ARTERIAL

## 2015-07-01 MED ORDER — SODIUM CHLORIDE 0.9 % WEIGHT BASED INFUSION
1.0000 mL/kg/h | INTRAVENOUS | Status: DC
  Administered 2015-07-01: 1 mL/kg/h via INTRAVENOUS

## 2015-07-01 MED ORDER — ASPIRIN 81 MG PO CHEW: 81.0000 mg | CHEWABLE_TABLET | ORAL | Status: DC

## 2015-07-01 SURGICAL SUPPLY — 20 items
BALLN ARMADA 6X40X135 (BALLOONS) ×4
BALLOON ARMADA 6X40X135 (BALLOONS) ×3 IMPLANT
CATH ANGIO 5F PIGTAIL 65CM (CATHETERS) ×4 IMPLANT
CATH CROSS OVER TEMPO 5F (CATHETERS) ×4 IMPLANT
CATH NAVICROSS ST .035X135CM (MICROCATHETER) ×4 IMPLANT
CATH STRAIGHT 5FR 65CM (CATHETERS) ×4 IMPLANT
KIT ENCORE 26 ADVANTAGE (KITS) ×4 IMPLANT
KIT PV (KITS) ×4 IMPLANT
SHEATH PINNACLE 5F 10CM (SHEATH) ×4 IMPLANT
SHEATH PINNACLE ST 6F 45CM (SHEATH) ×4 IMPLANT
STENT ABSOLUTE PRO 7X40X135 (Permanent Stent) ×4 IMPLANT
STOPCOCK MORSE 400PSI 3WAY (MISCELLANEOUS) ×4 IMPLANT
SYRINGE MEDRAD AVANTA MACH 7 (SYRINGE) ×4 IMPLANT
TAPE RADIOPAQUE TURBO (MISCELLANEOUS) ×4 IMPLANT
TRANSDUCER W/STOPCOCK (MISCELLANEOUS) ×4 IMPLANT
TRAY PV CATH (CUSTOM PROCEDURE TRAY) ×4 IMPLANT
TUBING CIL FLEX 10 FLL-RA (TUBING) ×4 IMPLANT
WIRE HITORQ VERSACORE ST 145CM (WIRE) ×4 IMPLANT
WIRE ROSEN-J .035X260CM (WIRE) ×4 IMPLANT
WIRE VERSACORE LOC 115CM (WIRE) ×4 IMPLANT

## 2015-07-01 NOTE — Interval H&P Note (Signed)
History and Physical Interval Note:  07/01/2015 10:33 AM  Larry Pham  has presented today for surgery, with the diagnosis of PAD  The various methods of treatment have been discussed with the patient and family. After consideration of risks, benefits and other options for treatment, the patient has consented to  Procedure(s): Abdominal Aortogram (N/A) as a surgical intervention .  The patient's history has been reviewed, patient examined, no change in status, stable for surgery.  I have reviewed the patient's chart and labs.  Questions were answered to the patient's satisfaction.     Kathlyn Sacramento

## 2015-07-01 NOTE — Telephone Encounter (Signed)
Received Physicians Statement from [employer] for Dr Donalda Ewings to complete and sign.  Received via fax, no AUTH/PMT.  Sent to [facility] for letter to be sent to patient  Sent via courier on 07/01/15. lp

## 2015-07-01 NOTE — Progress Notes (Signed)
Long 37fr sheath removed rfa and direct pressure maintained for 20 minutes resulting in complete hemostasis to rt groin.  Pt teaching done with voice back understanding of same.  Rt groin without oozing or hematoma.  Tegaderm DSG applied to site.  Distal DP pulse present and eaily palpatable.

## 2015-07-01 NOTE — Discharge Instructions (Signed)
Angiogram, Care After °Refer to this sheet in the next few weeks. These instructions provide you with information about caring for yourself after your procedure. Your health care provider may also give you more specific instructions. Your treatment has been planned according to current medical practices, but problems sometimes occur. Call your health care provider if you have any problems or questions after your procedure. °WHAT TO EXPECT AFTER THE PROCEDURE °After your procedure, it is typical to have the following: °· Bruising at the catheter insertion site that usually fades within 1-2 weeks. °· Blood collecting in the tissue (hematoma) that may be painful to the touch. It should usually decrease in size and tenderness within 1-2 weeks. °HOME CARE INSTRUCTIONS °· Take medicines only as directed by your health care provider. °· You may shower 24-48 hours after the procedure or as directed by your health care provider. Remove the bandage (dressing) and gently wash the site with plain soap and water. Pat the area dry with a clean towel. Do not rub the site, because this may cause bleeding. °· Do not take baths, swim, or use a hot tub until your health care provider approves. °· Check your insertion site every day for redness, swelling, or drainage. °· Do not apply powder or lotion to the site. °· Do not lift over 10 lb (4.5 kg) for 5 days after your procedure or as directed by your health care provider. °· Ask your health care provider when it is okay to: °¨ Return to work or school. °¨ Resume usual physical activities or sports. °¨ Resume sexual activity. °· Do not drive home if you are discharged the same day as the procedure. Have someone else drive you. °· You may drive 24 hours after the procedure unless otherwise instructed by your health care provider. °· Do not operate machinery or power tools for 24 hours after the procedure or as directed by your health care provider. °· If your procedure was done as an  outpatient procedure, which means that you went home the same day as your procedure, a responsible adult should be with you for the first 24 hours after you arrive home. °· Keep all follow-up visits as directed by your health care provider. This is important. °SEEK MEDICAL CARE IF: °· You have a fever. °· You have chills. °· You have increased bleeding from the catheter insertion site. Hold pressure on the site. °SEEK IMMEDIATE MEDICAL CARE IF: °· You have unusual pain at the catheter insertion site. °· You have redness, warmth, or swelling at the catheter insertion site. °· You have drainage (other than a small amount of blood on the dressing) from the catheter insertion site. °· The catheter insertion site is bleeding, and the bleeding does not stop after 30 minutes of holding steady pressure on the site. °· The area near or just beyond the catheter insertion site becomes pale, cool, tingly, or numb. °  °This information is not intended to replace advice given to you by your health care provider. Make sure you discuss any questions you have with your health care provider. °  °Document Released: 11/11/2004 Document Revised: 05/16/2014 Document Reviewed: 09/26/2012 °Elsevier Interactive Patient Education ©2016 Elsevier Inc[employer] ° °

## 2015-07-01 NOTE — H&P (View-Only) (Signed)
Primary care physician: Dr. Montez Morita Primary cardiologist: Dr. [name]  HPI  This is a 50 year old male who was referred by Dr. Montez Morita and Dr. [name] for evaluation and management of peripheral arterial disease. He has known history of coronary artery disease status post RCA PCI in the setting of inferior ST elevation myocardial infarction. He had cardiac catheterization done recently in December 2016 which showed moderate left main stenosis which was not significant by FFR, patent RCA stents and severe stenosis in the small distal left circumflex which was left to be treated medically. He Currently denies chest pain or shortness of breath. He has known history of hyperlipidemia and tobacco use. Over the last few months, he has experienced progressive bilateral calf pain with walking which is worse on the left side than the right side. This is currently happening with walking about half a block and has forced him to take time off his work. He works as a Museum/gallery curator. He has no rest pain or lower extremity ulceration. He is trying to quit smoking and currently is down to half a pack per day. He has family history of coronary artery disease. He underwent noninvasive vascular evaluation which showed normal ABI on the right side and moderately reduced on the left side with evidence of severe left SFA stenosis and borderline significant right SFA stenosis. He went to the emergency room recently due to worsening symptoms. He does not have symptoms at rest.    Allergies  Allergen Reactions  . Bee Venom Swelling     Current Outpatient Prescriptions on File Prior to Visit  Medication Sig Dispense Refill  . albuterol (PROVENTIL HFA;VENTOLIN HFA) 108 (90 BASE) MCG/ACT inhaler Inhale 2 puffs into the lungs every 6 (six) hours as needed for wheezing or shortness of breath. 1 Inhaler 2  . alfuzosin (UROXATRAL) 10 MG 24 hr tablet Take 1 tablet by mouth daily.  11  . aspirin EC 81 MG tablet Take 81 mg by mouth  every morning.    Marland Kitchen atorvastatin (LIPITOR) 80 MG tablet Take 80 mg by mouth daily.    . cetirizine (ZYRTEC) 10 MG tablet Take 10 mg by mouth daily as needed for allergies.   12  . clopidogrel (PLAVIX) 75 MG tablet Take 1 tablet (75 mg total) by mouth once. 10 tablet 0  . gabapentin (NEURONTIN) 300 MG capsule Take 300 mg by mouth at bedtime.    . hydrochlorothiazide (HYDRODIURIL) 25 MG tablet Take 25 mg by mouth daily.    . isosorbide mononitrate (IMDUR) 30 MG 24 hr tablet Take 30 mg by mouth daily.    Marland Kitchen lisinopril (PRINIVIL,ZESTRIL) 40 MG tablet Take 40 mg by mouth daily.    . metoprolol succinate (TOPROL-XL) 50 MG 24 hr tablet Take 1 tablet (50 mg total) by mouth daily. 30 tablet 6  . naproxen (NAPROSYN) 500 MG tablet Take 500 mg by mouth daily.     . nitroGLYCERIN (NITROSTAT) 0.4 MG SL tablet Place 1 tablet (0.4 mg total) under the tongue every 5 (five) minutes as needed for chest pain. 25 tablet 0  . omeprazole (PRILOSEC) 20 MG capsule Take 20 mg by mouth daily.    . ranolazine (RANEXA) 1000 MG SR tablet Take 1 tablet (1,000 mg total) by mouth 2 (two) times daily. 60 tablet 6  . SUMAtriptan (IMITREX) 50 MG tablet Take 1 tablet by mouth every 2 (two) hours as needed for migraine or headache.   0  . tamsulosin (FLOMAX) 0.4 MG CAPS capsule Take 0.4  mg by mouth daily.    . Vitamin D, Ergocalciferol, (DRISDOL) 50000 UNITS CAPS capsule Take 1 capsule by mouth 2 (two) times a week.  5   No current facility-administered medications on file prior to visit.     Past Medical History  Diagnosis Date  . Hypertension   . Carpal tunnel syndrome   . GERD (gastroesophageal reflux disease)   . Seasonal allergies   . History of ETOH abuse     STOPPED 9 MONTHS AGO  . Hyperlipidemia   . MI (myocardial infarction) ([place]) 02-2012  . CAD (coronary artery disease)   . Coronary atherosclerosis of artery bypass graft      Past Surgical History  Procedure Laterality Date  . Coronary artery  catheterization with stenting N/A 02-2102 AND 05-2012  . Left forearm surgery      s/p machine accident  . Trigger finger release      right 5th digit  . Coronary angioplasty    . Colonoscopy with propofol N/A 03/23/2015    Procedure: COLONOSCOPY WITH PROPOFOL;  Surgeon: Daneil Dolin, MD;  Location: [facility];  Service: Endoscopy;  Laterality: N/A;  Cecum time in 0849  time out  0959  total time 10 mintues  . Polypectomy N/A 03/23/2015    Procedure: POLYPECTOMY;  Surgeon: Daneil Dolin, MD;  Location: [facility];  Service: Endoscopy;  Laterality: N/A;  cecal, descending colon  . Cardiac catheterization N/A 04/22/2015    Procedure: Left Heart Cath and Coronary Angiography;  Surgeon: Burnell Blanks, MD;  Location: [facility];  Service: Cardiovascular;  Laterality: N/A;  . Cardiac catheterization N/A 04/22/2015    Procedure: Intravascular Pressure Wire/FFR Study;  Surgeon: Burnell Blanks, MD;  Location: [facility];  Service: Cardiovascular;  Laterality: N/A;     Family History  Problem Relation Age of Onset  . Hypertension Mother   . Heart disease Mother   . Cirrhosis Father     DIED AT AGE 45  . Breast cancer Paternal Grandmother     DECEASED  . Colon cancer Neg Hx      Social History   Social History  . Marital Status: Legally Separated    Spouse Name: N/A  . Number of Children: N/A  . Years of Education: N/A   Occupational History  . Not on file.   Social History Main Topics  . Smoking status: Current Every Day Smoker -- 1.00 packs/day for 30 years    Types: Cigarettes    Start date: 11/22/1977  . Smokeless tobacco: Never Used     Comment: one pack daily  . Alcohol Use: 0.0 oz/week    0 Standard drinks or equivalent per week     Comment: 5-6 beers a day.  . Drug Use: No  . Sexual Activity: Not on file   Other Topics Concern  . Not on file   Social History Narrative     ROS A 10 point review of system was performed. It is negative  other than that mentioned in the history of present illness.   PHYSICAL EXAM   BP 100/70 mmHg  Pulse 80  Ht 5' 11.5" (1.816 m)  Wt 251 lb 4.8 oz (113.989 kg)  BMI 34.56 kg/m2 Constitutional: He is oriented to person, place, and time. He appears well-developed and well-nourished. No distress.  HENT: No nasal discharge.  Head: Normocephalic and atraumatic.  Eyes: Pupils are equal and round.  No discharge. Neck: Normal range of motion. Neck  supple. No JVD present. No thyromegaly present.  Cardiovascular: Normal rate, regular rhythm, normal heart sounds. Exam reveals no gallop and no friction rub. No murmur heard.  Pulmonary/Chest: Effort normal and breath sounds normal. No stridor. No respiratory distress. He has no wheezes. He has no rales. He exhibits no tenderness.  Abdominal: Soft. Bowel sounds are normal. He exhibits no distension. There is no tenderness. There is no rebound and no guarding.  Musculoskeletal: Normal range of motion. He exhibits no edema and no tenderness.  Neurological: He is alert and oriented to person, place, and time. Coordination normal.  Skin: Skin is warm and dry. No rash noted. He is not diaphoretic. No erythema. No pallor.  Psychiatric: He has a normal mood and affect. His behavior is normal. Judgment and thought content normal.  Vascular: Femoral pulses are normal bilaterally. Posterior tibial is palpable on the right side but not the left side. Dorsalis pedis is not palpable bilaterally.     ASSESSMENT AND PLAN

## 2015-07-02 ENCOUNTER — Encounter (HOSPITAL_COMMUNITY): Payer: Self-pay | Admitting: Cardiovascular Disease

## 2015-07-03 ENCOUNTER — Telehealth: Payer: Self-pay

## 2015-07-03 MED ORDER — CLOPIDOGREL BISULFATE 75 MG PO TABS: 75.0000 mg | ORAL_TABLET | Freq: Once | ORAL | Status: DC

## 2015-07-03 NOTE — Telephone Encounter (Signed)
Pt called wanting to know if he is to continue Plavix 75 mg QD. Printed Rx given at ED on 06/18/15 for 10 tablets only. He had an OV with Dr. Fletcher Anon 06/23/15 and stated he was not given another Rx or instructions about whether or not to continue Plavix. He had a stent placed by Dr. Fletcher Anon 2.22.17 and the procedure note recommends he continue dual antiplatelet therapy for at least one month. He was unaware of this and would like to speak to a nurse regarding his Plavix. Please advise. He also has a question about returning to work and will be faxing some forms to fill out to [phone].    Panel Physicians Referring Physician Case Authorizing Physician   Larry Hampshire, MD (Primary)      Procedures    Abdominal Aortogram   Peripheral Vascular Intervention    Conclusion    1. No significant aortoiliac disease. 2. No significant obstructive disease affecting the right lower extremity except for moderate diffuse disease affecting the anterior tibial and peroneal Arteries.  3. Short occlusion of the mid to distal left SFA with two-vessel runoff below the knee. 4. Successful self-expanding stent placement to the left SFA.  Recommendations: Dual antiplatelet therapy for at least one month. Smoking cessation and aggressive treatment of risk factors.

## 2015-07-03 NOTE — Telephone Encounter (Signed)
Please try his home number first [phone]

## 2015-07-03 NOTE — Telephone Encounter (Signed)
Calling stating that Dr. Fletcher Anon did a procedure on him on Wed and was told he needed to be on Plavix for a month but was only given 10 tablets.  Advised that will send Rx into his pharmacy [facility] in [place] for a 30 day supply and for him to check with Dr. Fletcher Anon when he sees him on 3/7 if he should continue.  Also he states he needs to get another letter stating he needed to be out until seen on 3/7 due to his job.  He will have the manager at his office fax his job description to the [place] office.  He doesn't feel like he should return until seen on 3/7 even though letter written says no restrictions.  Advised will forward to Dr. Fletcher Anon and Doyle Askew.

## 2015-07-06 ENCOUNTER — Other Ambulatory Visit: Payer: BLUE CROSS/BLUE SHIELD

## 2015-07-06 NOTE — Telephone Encounter (Signed)
Left message on machine for pt to contact the office.  The pt will also need a LE arterial duplex scheduled on the same day as office follow-up.  Per Dr Fletcher Anon the pt needs a BMP 3-5 days post procedure.  The pt is scheduled for lab appointment today.

## 2015-07-06 NOTE — Telephone Encounter (Signed)
I explained to him during office visit that 1 week is usually sufficient after stent placement. Is there a reason he wants to extend to 3/7? Is he having any problems?

## 2015-07-07 ENCOUNTER — Other Ambulatory Visit: Payer: Self-pay | Admitting: Cardiology

## 2015-07-07 ENCOUNTER — Other Ambulatory Visit: Payer: Self-pay | Admitting: Cardiovascular Disease

## 2015-07-07 DIAGNOSIS — I739 Peripheral vascular disease, unspecified: Secondary | ICD-10-CM

## 2015-07-08 ENCOUNTER — Telehealth: Payer: Self-pay | Admitting: Cardiovascular Disease

## 2015-07-08 NOTE — Telephone Encounter (Signed)
Please see further documentation in 07/08/15 telephone encounter.

## 2015-07-08 NOTE — Telephone Encounter (Signed)
I spoke with the pt and he said he has been in the bed at home since his procedure 07/01/15.  The pt complains of being lightheaded and dizzy and developed a cough after his hospitalization.  The pt said due to these symptoms he has been unable to return to work.  I made the pt aware that he is over due to have a BMP checked post-procedure.  The pt will come into the office tomorrow.  I also advised the pt that if he is having this many issues then he needs to follow-up prior to next week and he did not feel this was necessary. The pt requested that a letter be faxed to his employer to keep him out of work until his follow-up on 07/14/15, Fax [phone] ATTN: Vanice Sarah.  The pt also said that [place] has faxed disability paperwork to our office for completion.  I made the pt aware that these forms will be completed by our outside company and then Dr Fletcher Anon will review and sign.

## 2015-07-08 NOTE — Telephone Encounter (Signed)
I am not sure how, but a call was put through to the [facility] triage phone regarding this. Message was left on my box requesting a call. I returned call to patient, who summarized needs as follows:  -requesting an extension on out of work status by another week -needs a work note furnished for this purpose -some paperwork needs to be submitted to [facility] for his disability filing  Pt requests a call. Also provided fax number for documents - [phone]

## 2015-07-08 NOTE — Telephone Encounter (Signed)
New Message  Pt following up on insurance paperwork that was to be signed by Dr Fletcher Anon- he also stated he needed to discuss "return-to-work" paperwork- stated that now he needs it to be extended to 3/7. Please call back and discuss.

## 2015-07-08 NOTE — Telephone Encounter (Signed)
Follow  Up   Pt following up on insurance paperwork that was to be signed by Dr Fletcher Anon- he also stated he needed to discuss "return-to-work" paperwork- stated that now he needs it to be extended to 3/7. Please call back and discuss.

## 2015-07-09 ENCOUNTER — Other Ambulatory Visit (INDEPENDENT_AMBULATORY_CARE_PROVIDER_SITE_OTHER): Payer: BLUE CROSS/BLUE SHIELD | Admitting: *Deleted

## 2015-07-09 DIAGNOSIS — I739 Peripheral vascular disease, unspecified: Secondary | ICD-10-CM

## 2015-07-09 LAB — BASIC METABOLIC PANEL
BUN: 20 mg/dL (ref 7–25)
CO2: 26 mmol/L (ref 20–31)
CREATININE: 1.32 mg/dL (ref 0.70–1.33)
Calcium: 9.9 mg/dL (ref 8.6–10.3)
Chloride: 100 mmol/L (ref 98–110)
GLUCOSE: 101 mg/dL — AB (ref 65–99)
POTASSIUM: 4.3 mmol/L (ref 3.5–5.3)
Sodium: 136 mmol/L (ref 135–146)

## 2015-07-09 NOTE — Addendum Note (Signed)
Addended by: Eulis Foster on: 07/09/2015 03:33 PM   Modules accepted: Orders

## 2015-07-14 ENCOUNTER — Encounter: Payer: Self-pay | Admitting: Cardiovascular Disease

## 2015-07-14 ENCOUNTER — Ambulatory Visit (INDEPENDENT_AMBULATORY_CARE_PROVIDER_SITE_OTHER): Payer: BLUE CROSS/BLUE SHIELD | Admitting: Cardiovascular Disease

## 2015-07-14 ENCOUNTER — Ambulatory Visit (HOSPITAL_COMMUNITY)
Admission: RE | Admit: 2015-07-14 | Discharge: 2015-07-14 | Disposition: A | Discharge status: Home / Self Care | Payer: BLUE CROSS/BLUE SHIELD | Source: Ambulatory Visit | Attending: Cardiovascular Disease | Admitting: Cardiovascular Disease

## 2015-07-14 VITALS — BP 148/98 | HR 76 | Ht 71.5 in | Wt 241.9 lb

## 2015-07-14 DIAGNOSIS — E785 Hyperlipidemia, unspecified: Secondary | ICD-10-CM | POA: Diagnosis not present

## 2015-07-14 DIAGNOSIS — I739 Peripheral vascular disease, unspecified: Secondary | ICD-10-CM

## 2015-07-14 DIAGNOSIS — R938 Abnormal findings on diagnostic imaging of other specified body structures: Secondary | ICD-10-CM | POA: Insufficient documentation

## 2015-07-14 DIAGNOSIS — I10 Essential (primary) hypertension: Secondary | ICD-10-CM | POA: Insufficient documentation

## 2015-07-14 NOTE — Patient Instructions (Signed)
Medication Instructions:  Your physician recommends that you continue on your current medications as directed. Please refer to the Current Medication list given to you today.  Labwork: No new orders.   Testing/Procedures: Your physician has requested that you have a lower extremity arterial duplex in 6 MONTHS. This test is an ultrasound of the arteries in the legs. It looks at arterial blood flow in the legs. Allow one hour for Lower Arterial scans. There are no restrictions or special instructions  Follow-Up: Your physician wants you to follow-up in: 6 MONTHS with Dr Arida. You will receive a reminder letter in the mail two months in advance. If you don't receive a letter, please call our office to schedule the follow-up appointment.   Any Other Special Instructions Will Be Listed Below (If Applicable).     If you need a refill on your cardiac medications before your next appointment, please call your pharmacy.   

## 2015-07-14 NOTE — Progress Notes (Signed)
Cardiology Office Note   Date:  07/14/2015   ID:  Jahel, Larry Pham, MRN [id number]  PCP:  [name], NP  Cardiologist:  Dr. [name]  No chief complaint on file.     History of Present Illness: Larry Pham is a 50 y.o. male who presents for a follow-up visit regarding peripheral arterial disease. He has known history of coronary artery disease status post RCA PCI in the setting of inferior ST elevation myocardial infarction. He had cardiac catheterization done recently in December 2016 which showed moderate left main stenosis which was not significant by FFR, patent RCA stents and severe stenosis in the small distal left circumflex which was left to be treated medically.He has known history of hyperlipidemia and tobacco use. He was seen recently for bilateral calf claudication worse on the left side . He underwent noninvasive vascular evaluation which showed normal ABI on the right side and moderately reduced on the left side with evidence of severe left SFA stenosis and borderline significant right SFA stenosis. I proceeded with angiography last month which showed short occlusion of the mid to distal left SFA with two-vessel runoff below the knee. No significant obstructive disease affecting the right lower extremity. I performed successful self-expanding stent placement to the left SFA without complications. He reports resolution of claudication since then.   Past Medical History  Diagnosis Date  . Hypertension   . Carpal tunnel syndrome   . GERD (gastroesophageal reflux disease)   . Seasonal allergies   . History of ETOH abuse     STOPPED 9 MONTHS AGO  . Hyperlipidemia   . MI (myocardial infarction) ([place]) 02-2012  . CAD (coronary artery disease)   . Coronary atherosclerosis of artery bypass graft     Past Surgical History  Procedure Laterality Date  . Coronary artery catheterization with stenting N/A 02-2102 AND 05-2012  . Left forearm surgery      s/p machine accident  . Trigger finger release      right 5th digit  . Coronary angioplasty    . Colonoscopy with propofol N/A 03/23/2015    Procedure: COLONOSCOPY WITH PROPOFOL;  Surgeon: Daneil Dolin, MD;  Location: [facility];  Service: Endoscopy;  Laterality: N/A;  Cecum time in 0849  time out  0959  total time 10 mintues  . Polypectomy N/A 03/23/2015    Procedure: POLYPECTOMY;  Surgeon: Daneil Dolin, MD;  Location: [facility];  Service: Endoscopy;  Laterality: N/A;  cecal, descending colon  . Cardiac catheterization N/A 04/22/2015    Procedure: Left Heart Cath and Coronary Angiography;  Surgeon: Burnell Blanks, MD;  Location: [place] [facility];  Service: Cardiovascular;  Laterality: N/A;  . Cardiac catheterization N/A 04/22/2015    Procedure: Intravascular Pressure Wire/FFR Study;  Surgeon: Burnell Blanks, MD;  Location: [place] [facility];  Service: Cardiovascular;  Laterality: N/A;  . Peripheral vascular catheterization N/A 07/01/2015    Procedure: Abdominal Aortogram;  Surgeon: Wellington Hampshire, MD;  Location: [place] [facility];  Service: Cardiovascular;  Laterality: N/A;  . Lower extremity angiogram Bilateral 07/01/2015    Procedure: Lower Extremity Angiogram;  Surgeon: Wellington Hampshire, MD;  Location: [place] [facility];  Service: Cardiovascular;  Laterality: Bilateral;  . Peripheral vascular catheterization Left 07/01/2015    Procedure: Peripheral Vascular Intervention;  Surgeon: Wellington Hampshire, MD;  Location: [place] [facility];  Service: Cardiovascular;  Laterality: Left;  SFA     Current Outpatient  Prescriptions  Medication Sig Dispense Refill  . albuterol (PROVENTIL HFA;VENTOLIN HFA) 108 (90 BASE) MCG/ACT inhaler Inhale 2 puffs into the lungs every 6 (six) hours as needed for wheezing or shortness of breath. 1 Inhaler 2  . alfuzosin (UROXATRAL) 10 MG 24 hr tablet Take 1 tablet by mouth daily.  11  . aspirin EC 81 MG tablet Take 81 mg by mouth every morning.    Marland Kitchen  atorvastatin (LIPITOR) 80 MG tablet Take 80 mg by mouth daily.    . cetirizine (ZYRTEC) 10 MG tablet Take 10 mg by mouth daily as needed for allergies.   12  . clopidogrel (PLAVIX) 75 MG tablet Take 1 tablet (75 mg total) by mouth once. 30 tablet 0  . gabapentin (NEURONTIN) 300 MG capsule Take 300 mg by mouth at bedtime.    . hydrochlorothiazide (HYDRODIURIL) 25 MG tablet Take 25 mg by mouth daily.    . isosorbide mononitrate (IMDUR) 30 MG 24 hr tablet Take 30 mg by mouth daily.    Marland Kitchen lisinopril (PRINIVIL,ZESTRIL) 40 MG tablet Take 40 mg by mouth daily.    . metoprolol succinate (TOPROL-XL) 50 MG 24 hr tablet Take 1 tablet (50 mg total) by mouth daily. 30 tablet 6  . nitroGLYCERIN (NITROSTAT) 0.4 MG SL tablet Place 1 tablet (0.4 mg total) under the tongue every 5 (five) minutes as needed for chest pain. 25 tablet 0  . omeprazole (PRILOSEC) 20 MG capsule Take 20 mg by mouth daily.    . ranolazine (RANEXA) 1000 MG SR tablet Take 1 tablet (1,000 mg total) by mouth 2 (two) times daily. 60 tablet 6  . SUMAtriptan (IMITREX) 50 MG tablet Take 1 tablet by mouth every 2 (two) hours as needed for migraine or headache.   0  . tamsulosin (FLOMAX) 0.4 MG CAPS capsule Take 0.4 mg by mouth daily.    . Vitamin D, Ergocalciferol, (DRISDOL) 50000 UNITS CAPS capsule Take 1 capsule by mouth 2 (two) times a week.  5   No current facility-administered medications for this visit.    Allergies:   Bee venom    Social History:  The patient  reports that he quit smoking about 2 weeks ago. His smoking use included Cigarettes. He started smoking about 37 years ago. He has a 30 pack-year smoking history. He has never used smokeless tobacco. He reports that he drinks alcohol. He reports that he does not use illicit drugs.   Family History:  The patient's family history includes Breast cancer in his paternal grandmother; Cirrhosis in his father; Heart disease in his mother; Hypertension in his mother. There is no history of  Colon cancer.    ROS:  Please see the history of present illness.   Otherwise, review of systems are positive for none.   All other systems are reviewed and negative.    PHYSICAL EXAM: VS:  BP 148/98 mmHg  Pulse 76  Ht 5' 11.5" (1.816 m)  Wt 241 lb 14.4 oz (109.725 kg)  BMI 33.27 kg/m2  SpO2 98% , BMI Body mass index is 33.27 kg/(m^2). GEN: Well nourished, well developed, in no acute distress HEENT: normal Neck: no JVD, carotid bruits, or masses Cardiac: RRR; no murmurs, rubs, or gallops,no edema  Respiratory:  clear to auscultation bilaterally, normal work of breathing GI: soft, nontender, nondistended, + BS MS: no deformity or atrophy Skin: warm and dry, no rash Neuro:  Strength and sensation are intact Psych: euthymic mood, full affect Vascular: Femoral pulses are normal bilaterally with  no evidence of hematoma. Posterior tibial pulses palpable on the left side.     Recent Labs: 06/17/2015: Hemoglobin 14.8; Platelets 261 07/09/2015: BUN 20; Creat 1.32; Potassium 4.3; Sodium 136    Lipid Panel No results found for: CHOL, TRIG, HDL, CHOLHDL, VLDL, LDLCALC, LDLDIRECT    Wt Readings from Last 3 Encounters:  07/14/15 241 lb 14.4 oz (109.725 kg)  07/01/15 249 lb (112.946 kg)  06/23/15 251 lb 4.8 oz (113.989 kg)      Other studies Reviewed: Additional studies/ records that were reviewed today include: Noninvasive vascular studies from today. Review of the above records demonstrates: Improvement in ABI on the left side at 0.98 with patent stent on duplex.   ASSESSMENT AND PLAN:  1.  Peripheral arterial disease: Status post successful self-expanding stent placement to the left SFA recently with complete resolution of claudication. Postprocedure ABI improved to normal. Continue medical therapy for now and repeat Doppler in 6 months.  2. Tobacco use: I congratulated him on smoking cessation and I advised him not to smoke again.  3. Hyperlipidemia: Continue treatment with  atorvastatin with a target LDL of less than 70.  4. Coronary artery disease: Currently he has no symptoms suggestive of angina. Continue medical therapy.      Disposition:   FU with me in 6 months  Signed,  Kathlyn Sacramento, MD  07/14/2015 10:59 AM    [place]

## 2015-07-29 ENCOUNTER — Other Ambulatory Visit (HOSPITAL_COMMUNITY): Payer: Self-pay | Admitting: Cardiology

## 2015-07-29 DIAGNOSIS — R42 Dizziness and giddiness: Secondary | ICD-10-CM

## 2015-08-03 ENCOUNTER — Ambulatory Visit (HOSPITAL_COMMUNITY)
Admission: RE | Admit: 2015-08-03 | Discharge: 2015-08-03 | Disposition: A | Discharge status: Home / Self Care | Payer: BLUE CROSS/BLUE SHIELD | Source: Ambulatory Visit | Attending: Cardiology | Admitting: Cardiology

## 2015-08-03 DIAGNOSIS — R42 Dizziness and giddiness: Secondary | ICD-10-CM | POA: Insufficient documentation

## 2015-08-03 DIAGNOSIS — I6523 Occlusion and stenosis of bilateral carotid arteries: Secondary | ICD-10-CM | POA: Insufficient documentation

## 2015-08-07 ENCOUNTER — Ambulatory Visit: Payer: BLUE CROSS/BLUE SHIELD | Admitting: Urology

## 2015-09-11 ENCOUNTER — Other Ambulatory Visit (HOSPITAL_COMMUNITY): Payer: Self-pay | Admitting: Neurology

## 2015-09-11 ENCOUNTER — Other Ambulatory Visit: Payer: Self-pay | Admitting: Neurology

## 2015-09-11 DIAGNOSIS — I6509 Occlusion and stenosis of unspecified vertebral artery: Secondary | ICD-10-CM

## 2015-09-18 ENCOUNTER — Ambulatory Visit (HOSPITAL_COMMUNITY): Payer: BLUE CROSS/BLUE SHIELD

## 2015-09-18 ENCOUNTER — Ambulatory Visit (INDEPENDENT_AMBULATORY_CARE_PROVIDER_SITE_OTHER): Payer: BLUE CROSS/BLUE SHIELD | Admitting: Urology

## 2015-09-18 ENCOUNTER — Ambulatory Visit (HOSPITAL_COMMUNITY)
Admission: RE | Admit: 2015-09-18 | Discharge: 2015-09-18 | Disposition: A | Discharge status: Home / Self Care | Payer: BLUE CROSS/BLUE SHIELD | Source: Ambulatory Visit | Attending: Neurology | Admitting: Neurology

## 2015-09-18 DIAGNOSIS — I6509 Occlusion and stenosis of unspecified vertebral artery: Secondary | ICD-10-CM | POA: Diagnosis present

## 2015-09-18 DIAGNOSIS — R351 Nocturia: Secondary | ICD-10-CM

## 2015-09-18 DIAGNOSIS — I6523 Occlusion and stenosis of bilateral carotid arteries: Secondary | ICD-10-CM | POA: Diagnosis not present

## 2015-09-18 DIAGNOSIS — N3941 Urge incontinence: Secondary | ICD-10-CM | POA: Diagnosis not present

## 2015-09-18 DIAGNOSIS — N401 Enlarged prostate with lower urinary tract symptoms: Secondary | ICD-10-CM | POA: Diagnosis not present

## 2015-09-18 DIAGNOSIS — I6503 Occlusion and stenosis of bilateral vertebral arteries: Secondary | ICD-10-CM | POA: Diagnosis not present

## 2015-09-18 DIAGNOSIS — R3915 Urgency of urination: Secondary | ICD-10-CM

## 2015-09-18 MED ORDER — IOPAMIDOL (ISOVUE-370) INJECTION 76%
75.0000 mL | Freq: Once | INTRAVENOUS | Status: AC | PRN
  Administered 2015-09-18: 75 mL via INTRAVENOUS

## 2015-10-28 ENCOUNTER — Other Ambulatory Visit: Payer: Self-pay | Admitting: Nurse Practitioner

## 2015-10-28 DIAGNOSIS — G629 Polyneuropathy, unspecified: Secondary | ICD-10-CM

## 2015-11-12 ENCOUNTER — Ambulatory Visit (HOSPITAL_COMMUNITY)
Admission: RE | Admit: 2015-11-12 | Discharge: 2015-11-12 | Disposition: A | Discharge status: Home / Self Care | Payer: BLUE CROSS/BLUE SHIELD | Source: Ambulatory Visit | Attending: Neurology | Admitting: Neurology

## 2015-11-12 ENCOUNTER — Other Ambulatory Visit: Payer: Self-pay | Admitting: Neurology

## 2015-11-12 ENCOUNTER — Other Ambulatory Visit: Payer: Self-pay | Admitting: Cardiology

## 2015-11-12 DIAGNOSIS — M5489 Other dorsalgia: Secondary | ICD-10-CM

## 2015-11-12 DIAGNOSIS — M549 Dorsalgia, unspecified: Secondary | ICD-10-CM | POA: Diagnosis not present

## 2015-11-12 DIAGNOSIS — M129 Arthropathy, unspecified: Secondary | ICD-10-CM | POA: Diagnosis not present

## 2015-11-12 NOTE — Telephone Encounter (Signed)
Please advise 

## 2015-11-12 NOTE — Telephone Encounter (Signed)
REFILL 

## 2015-11-18 ENCOUNTER — Ambulatory Visit: Payer: BLUE CROSS/BLUE SHIELD

## 2016-02-17 ENCOUNTER — Emergency Department (HOSPITAL_COMMUNITY): Payer: BLUE CROSS/BLUE SHIELD

## 2016-02-17 ENCOUNTER — Encounter (HOSPITAL_COMMUNITY): Payer: Self-pay | Admitting: Emergency Medicine

## 2016-02-17 ENCOUNTER — Observation Stay (HOSPITAL_COMMUNITY)
Admission: EM | Admit: 2016-02-17 | Discharge: 2016-02-18 | Disposition: A | Discharge status: Home / Self Care | Payer: BLUE CROSS/BLUE SHIELD | Attending: Internal Medicine | Admitting: Internal Medicine

## 2016-02-17 DIAGNOSIS — F1010 Alcohol abuse, uncomplicated: Secondary | ICD-10-CM | POA: Diagnosis present

## 2016-02-17 DIAGNOSIS — I739 Peripheral vascular disease, unspecified: Secondary | ICD-10-CM | POA: Diagnosis present

## 2016-02-17 DIAGNOSIS — Z91199 Patient's noncompliance with other medical treatment and regimen due to unspecified reason: Secondary | ICD-10-CM

## 2016-02-17 DIAGNOSIS — I25118 Atherosclerotic heart disease of native coronary artery with other forms of angina pectoris: Secondary | ICD-10-CM | POA: Diagnosis not present

## 2016-02-17 DIAGNOSIS — Z7982 Long term (current) use of aspirin: Secondary | ICD-10-CM | POA: Diagnosis not present

## 2016-02-17 DIAGNOSIS — R079 Chest pain, unspecified: Secondary | ICD-10-CM | POA: Diagnosis not present

## 2016-02-17 DIAGNOSIS — Z79899 Other long term (current) drug therapy: Secondary | ICD-10-CM | POA: Diagnosis not present

## 2016-02-17 DIAGNOSIS — N182 Chronic kidney disease, stage 2 (mild): Secondary | ICD-10-CM | POA: Diagnosis present

## 2016-02-17 DIAGNOSIS — Z9119 Patient's noncompliance with other medical treatment and regimen: Secondary | ICD-10-CM

## 2016-02-17 DIAGNOSIS — I10 Essential (primary) hypertension: Secondary | ICD-10-CM | POA: Diagnosis present

## 2016-02-17 DIAGNOSIS — Z720 Tobacco use: Secondary | ICD-10-CM | POA: Diagnosis present

## 2016-02-17 DIAGNOSIS — F17210 Nicotine dependence, cigarettes, uncomplicated: Secondary | ICD-10-CM | POA: Diagnosis not present

## 2016-02-17 DIAGNOSIS — I2510 Atherosclerotic heart disease of native coronary artery without angina pectoris: Secondary | ICD-10-CM

## 2016-02-17 DIAGNOSIS — I208 Other forms of angina pectoris: Secondary | ICD-10-CM

## 2016-02-17 DIAGNOSIS — R0789 Other chest pain: Secondary | ICD-10-CM | POA: Diagnosis present

## 2016-02-17 LAB — BASIC METABOLIC PANEL
Anion gap: 6 (ref 5–15)
BUN: 15 mg/dL (ref 6–20)
CO2: 23 mmol/L (ref 22–32)
CREATININE: 1.29 mg/dL — AB (ref 0.61–1.24)
Calcium: 9.2 mg/dL (ref 8.9–10.3)
Chloride: 110 mmol/L (ref 101–111)
GFR calc Af Amer: >60 mL/min (ref >60)
GLUCOSE: 89 mg/dL (ref 65–99)
POTASSIUM: 4 mmol/L (ref 3.5–5.1)
Sodium: 139 mmol/L (ref 135–145)

## 2016-02-17 LAB — CBC
HEMATOCRIT: 41.5 % (ref 39.0–52.0)
Hemoglobin: 14 g/dL (ref 13.0–17.0)
MCH: 31.4 pg (ref 26.0–34.0)
MCHC: 33.7 g/dL (ref 30.0–36.0)
MCV: 93 fL (ref 78.0–100.0)
PLATELETS: 245 10*3/uL (ref 150–400)
RBC: 4.46 MIL/uL (ref 4.22–5.81)
RDW: 14.1 % (ref 11.5–15.5)
WBC: 9.9 10*3/uL (ref 4.0–10.5)

## 2016-02-17 LAB — HEPATIC FUNCTION PANEL
ALBUMIN: 4 g/dL (ref 3.5–5.0)
ALK PHOS: 89 U/L (ref 38–126)
ALT: 31 U/L (ref 17–63)
AST: 24 U/L (ref 15–41)
BILIRUBIN INDIRECT: 0.2 mg/dL — AB (ref 0.3–0.9)
BILIRUBIN TOTAL: 0.3 mg/dL (ref 0.3–1.2)
Bilirubin, Direct: 0.1 mg/dL (ref 0.1–0.5)
TOTAL PROTEIN: 7 g/dL (ref 6.5–8.1)

## 2016-02-17 LAB — TROPONIN I: Troponin I: <0.03 ng/mL (ref <0.03)

## 2016-02-17 MED ORDER — MORPHINE SULFATE (PF) 4 MG/ML IV SOLN: 4.0000 mg | INTRAVENOUS | Status: DC | PRN

## 2016-02-17 MED ORDER — NITROGLYCERIN 2 % TD OINT
1.0000 [in_us] | TOPICAL_OINTMENT | Freq: Once | TRANSDERMAL | Status: AC
  Administered 2016-02-17: 1 [in_us] via TOPICAL
  Filled 2016-02-17: qty 1

## 2016-02-17 NOTE — H&P (Signed)
History and Physical    [name] DOB: 11/16/1965 DOA: 02/17/2016  PCP: [name], NP  Patient coming from: Home.    Chief Complaint:   Chest pain with walking.   HPI: Larry Pham is an 50 y.o. male with hx of prior CVA, known PVD s/p stent placement on his right lower extremity, hx of known CAD, s/p prior MI and 2 stent placements, active smoker, presented to the ER as he was experiencing exertional chest pressure and some SOB tonight.  He has been walking about every other day at work, and occasionally would take a SL NTG.  He has had no diaphoresis.  In the ER, he was asymptomatic, but was found that his BP was quite high at 208/109.  He was given NTP, and BP improved to 160/100 when I saw him.  He has not been compliant with all his meds.  Work up in the ER showed EKG without any acute ST T changes, and Cr of 1.09, negative initial troponin, and CXR was clear.  Hospitalist was asked to admit him for r/out.   ED Course:  See above.  Rewiew of Systems:  Constitutional: Negative for malaise, fever and chills. No significant weight loss or weight gain Eyes: Negative for eye pain, redness and discharge, diplopia, visual changes, or flashes of light. ENMT: Negative for ear pain, hoarseness, nasal congestion, sinus pressure and sore throat. No headaches; tinnitus, drooling, or problem swallowing. Cardiovascular: Negative for  palpitations, diaphoresis, dyspnea and peripheral edema. ; No orthopnea, PND Respiratory: Negative for cough, hemoptysis, wheezing and stridor. No pleuritic chestpain. Gastrointestinal: Negative for diarrhea, constipation,  melena, blood in stool, hematemesis, jaundice and rectal bleeding.    Genitourinary: Negative for frequency, dysuria, incontinence,flank pain and hematuria; Musculoskeletal: Negative for back pain and neck pain. Negative for swelling and trauma.;  Skin: . Negative for pruritus, rash, abrasions, bruising and skin lesion.;  ulcerations Neuro: Negative for headache, lightheadedness and neck stiffness. Negative for weakness, altered level of consciousness , altered mental status, extremity weakness, burning feet, involuntary movement, seizure and syncope.  Psych: negative for anxiety, depression, insomnia, tearfulness, panic attacks, hallucinations, paranoia, suicidal or homicidal ideation    Past Medical History:  Diagnosis Date  . CAD (coronary artery disease)   . Carpal tunnel syndrome   . Coronary atherosclerosis of artery bypass graft   . GERD (gastroesophageal reflux disease)   . History of ETOH abuse    STOPPED 9 MONTHS AGO  . Hyperlipidemia   . Hypertension   . MI (myocardial infarction) 02-2012  . Seasonal allergies     Rewiew of Systems:  Constitutional: Negative for malaise, fever and chills. No significant weight loss or weight gain Eyes: Negative for eye pain, redness and discharge, diplopia, visual changes, or flashes of light. ENMT: Negative for ear pain, hoarseness, nasal congestion, sinus pressure and sore throat. No headaches; tinnitus, drooling, or problem swallowing. Cardiovascular: Negative for chest pain, palpitations, diaphoresis, dyspnea and peripheral edema. ; No orthopnea, PND Respiratory: Negative for cough, hemoptysis, wheezing and stridor. No pleuritic chestpain. Gastrointestinal: Negative for nausea, vomiting, diarrhea, constipation, abdominal pain, melena, blood in stool, hematemesis, jaundice and rectal bleeding.    Genitourinary: Negative for frequency, dysuria, incontinence,flank pain and hematuria; Musculoskeletal: Negative for back pain and neck pain. Negative for swelling and trauma.;  Skin: . Negative for pruritus, rash, abrasions, bruising and skin lesion.; ulcerations Neuro: Negative for headache, lightheadedness and neck stiffness. Negative for weakness, altered level of consciousness , altered mental  status, extremity weakness, burning feet, involuntary movement,  seizure and syncope.  Psych: negative for anxiety, depression, insomnia, tearfulness, panic attacks, hallucinations, paranoia, suicidal or homicidal ideation   Past Surgical History:  Procedure Laterality Date  . CARDIAC CATHETERIZATION N/A 04/22/2015   Procedure: Left Heart Cath and Coronary Angiography;  Surgeon: Burnell Blanks, MD;  Location: [place] [facility];  Service: Cardiovascular;  Laterality: N/A;  . CARDIAC CATHETERIZATION N/A 04/22/2015   Procedure: Intravascular Pressure Wire/FFR Study;  Surgeon: Burnell Blanks, MD;  Location: [place] [facility];  Service: Cardiovascular;  Laterality: N/A;  . COLONOSCOPY WITH PROPOFOL N/A 03/23/2015   Procedure: COLONOSCOPY WITH PROPOFOL;  Surgeon: Daneil Dolin, MD;  Location: [facility];  Service: Endoscopy;  Laterality: N/A;  Cecum time in 0849  time out  0959  total time 10 mintues  . CORONARY ANGIOPLASTY    . CORONARY ARTERY CATHETERIZATION WITH STENTING N/A 02-2102 AND 05-2012  . LEFT FOREARM SURGERY     s/p machine accident  . LOWER EXTREMITY ANGIOGRAM Bilateral 07/01/2015   Procedure: Lower Extremity Angiogram;  Surgeon: Wellington Hampshire, MD;  Location: [place] [facility];  Service: Cardiovascular;  Laterality: Bilateral;  . PERIPHERAL VASCULAR CATHETERIZATION N/A 07/01/2015   Procedure: Abdominal Aortogram;  Surgeon: Wellington Hampshire, MD;  Location: [place] [facility];  Service: Cardiovascular;  Laterality: N/A;  . PERIPHERAL VASCULAR CATHETERIZATION Left 07/01/2015   Procedure: Peripheral Vascular Intervention;  Surgeon: Wellington Hampshire, MD;  Location: [place] [facility];  Service: Cardiovascular;  Laterality: Left;  SFA  . POLYPECTOMY N/A 03/23/2015   Procedure: POLYPECTOMY;  Surgeon: Daneil Dolin, MD;  Location: [facility];  Service: Endoscopy;  Laterality: N/A;  cecal, descending colon  . TRIGGER FINGER RELEASE     right 5th digit     reports that he quit smoking about 7 months ago. His smoking use included Cigarettes. He  started smoking about 38 years ago. He has a 30.00 pack-year smoking history. He has never used smokeless tobacco. He reports that he drinks alcohol. He reports that he does not use drugs.  Allergies  Allergen Reactions  . Bee Venom Swelling    Family History  Problem Relation Age of Onset  . Hypertension Mother   . Heart disease Mother   . Cirrhosis Father     DIED AT AGE 80  . Breast cancer Paternal Grandmother     DECEASED  . Colon cancer Neg Hx      Prior to Admission medications   Medication Sig Start Date End Date Taking? Authorizing Provider  albuterol (PROVENTIL HFA;VENTOLIN HFA) 108 (90 BASE) MCG/ACT inhaler Inhale 2 puffs into the lungs every 6 (six) hours as needed for wheezing or shortness of breath. 04/13/15  Yes Arnoldo Lenis, MD  alfuzosin (UROXATRAL) 10 MG 24 hr tablet Take 1 tablet by mouth daily. 05/08/15  Yes Historical Provider, MD  aspirin EC 81 MG tablet Take 81 mg by mouth every morning.   Yes Historical Provider, MD  atorvastatin (LIPITOR) 80 MG tablet Take 80 mg by mouth daily.   Yes Historical Provider, MD  cetirizine (ZYRTEC) 10 MG tablet Take 10 mg by mouth daily as needed for allergies.  02/02/15  Yes Historical Provider, MD  gabapentin (NEURONTIN) 300 MG capsule Take 300 mg by mouth at bedtime.   Yes Historical Provider, MD  hydrochlorothiazide (HYDRODIURIL) 25 MG tablet Take 25 mg by mouth daily.   Yes Historical Provider, MD  HYDROcodone-acetaminophen (NORCO) 10-325 MG tablet  Take 1 tablet by mouth every 12 (twelve) hours as needed for moderate pain or severe pain.   Yes Historical Provider, MD  isosorbide mononitrate (IMDUR) 30 MG 24 hr tablet Take 30 mg by mouth daily.   Yes Historical Provider, MD  lisinopril (PRINIVIL,ZESTRIL) 40 MG tablet Take 40 mg by mouth daily.   Yes Historical Provider, MD  metoprolol succinate (TOPROL-XL) 50 MG 24 hr tablet Take 1 tablet (50 mg total) by mouth daily. 12/11/14  Yes Arnoldo Lenis, MD  nitroGLYCERIN  (NITROSTAT) 0.4 MG SL tablet PLACE 1 TABLET UNDER THE TONGUE & ALLOW TO DISSOLVE EVERY 5 MIN AS NEEDED FOR CHEST PAIN 11/12/15  Yes Arnoldo Lenis, MD  omeprazole (PRILOSEC) 20 MG capsule Take 20 mg by mouth daily.   Yes Historical Provider, MD  ranolazine (RANEXA) 1000 MG SR tablet Take 1 tablet (1,000 mg total) by mouth 2 (two) times daily. 06/09/15  Yes Arnoldo Lenis, MD  solifenacin (VESICARE) 5 MG tablet Take 5 mg by mouth daily.   Yes Historical Provider, MD  SUMAtriptan (IMITREX) 50 MG tablet Take 1 tablet by mouth every 2 (two) hours as needed for migraine or headache.  02/02/15  Yes Historical Provider, MD  tamsulosin (FLOMAX) 0.4 MG CAPS capsule Take 0.4 mg by mouth daily.   Yes Historical Provider, MD  tiZANidine (ZANAFLEX) 4 MG tablet Take 4 mg by mouth 2 (two) times daily as needed for muscle spasms.   Yes Historical Provider, MD  Vitamin D, Ergocalciferol, (DRISDOL) 50000 UNITS CAPS capsule Take 1 capsule by mouth 2 (two) times a week. 02/02/15  Yes Historical Provider, MD  clopidogrel (PLAVIX) 75 MG tablet Take 1 tablet (75 mg total) by mouth once. 07/03/15   Wellington Hampshire, MD    Physical Exam: Vitals:   02/17/16 2054 02/17/16 2146 02/17/16 2152 02/17/16 2200  BP: 146/84 152/76  (!) 148/48  Pulse: 63 (!) 56 (!) 58 (!) 58  Resp: 20  17 17   Temp: 98.3 F (36.8 C)     TempSrc: Oral     SpO2: 99% 98% 98% 99%  Weight: 106.6 kg (235 lb)     Height: 5\' 11"  (1.803 m)         Constitutional: NAD, calm, comfortable Vitals:   02/17/16 2054 02/17/16 2146 02/17/16 2152 02/17/16 2200  BP: 146/84 152/76  (!) 148/48  Pulse: 63 (!) 56 (!) 58 (!) 58  Resp: 20  17 17   Temp: 98.3 F (36.8 C)     TempSrc: Oral     SpO2: 99% 98% 98% 99%  Weight: 106.6 kg (235 lb)     Height: 5\' 11"  (1.803 m)      Eyes: PERRL, lids and conjunctivae normal ENMT: Mucous membranes are moist. Posterior pharynx clear of any exudate or lesions.Normal dentition.  Neck: normal, supple, no masses, no  thyromegaly Respiratory: clear to auscultation bilaterally, no wheezing, no crackles. Normal respiratory effort. No accessory muscle use.  Cardiovascular: Regular rate and rhythm, no murmurs / rubs / gallops. No extremity edema. 2+ pedal pulses. No carotid bruits.  Abdomen: no tenderness, no masses palpated. No hepatosplenomegaly. Bowel sounds positive.  Musculoskeletal: no clubbing / cyanosis. No joint deformity upper and lower extremities. Good ROM, no contractures. Normal muscle tone.  Skin: no rashes, lesions, ulcers. No induration Neurologic: CN 2-12 grossly intact. Sensation intact, DTR normal. Strength 5/5 in all 4.  Psychiatric: Normal judgment and insight. Alert and oriented x 3. Normal mood.   Labs on Admission:  I have personally reviewed following labs and imaging studies  CBC:  Recent Labs Lab 02/17/16 2122  WBC 9.9  HGB 14.0  HCT 41.5  MCV 93.0  PLT 99991111   Basic Metabolic Panel:  Recent Labs Lab 02/17/16 2122  NA 139  K 4.0  CL 110  CO2 23  GLUCOSE 89  BUN 15  CREATININE 1.29*  CALCIUM 9.2   GFR: Estimated Creatinine Clearance: 83.2 mL/min (by C-G formula based on SCr of 1.29 mg/dL (H)). Liver Function Tests:  Recent Labs Lab 02/17/16 2122  AST 24  ALT 31  ALKPHOS 89  BILITOT 0.3  PROT 7.0  ALBUMIN 4.0   Cardiac Enzymes:  Recent Labs Lab 02/17/16 2122  TROPONINI <0.03    Radiological Exams on Admission: Dg Chest 2 View  Result Date: 02/17/2016 CLINICAL DATA:  Chest pressure and shortness of Breath EXAM: CHEST  2 VIEW COMPARISON:  08/03/2013 FINDINGS: The heart size and mediastinal contours are within normal limits. Both lungs are clear. The visualized skeletal structures are unremarkable. IMPRESSION: No acute abnormality noted. Electronically Signed   By: Inez Catalina M.D.   On: 02/17/2016 21:27    EKG: Independently reviewed.   Assessment/Plan Principal Problem:   Chest pain on exertion Active Problems:   CAD (coronary artery  disease), native coronary artery   Tobacco use   Essential hypertension   PAD (peripheral artery disease) ([facility])   Chest pain    PLAN:   Chest pain:  He has known CAD, with 2 stents, PVD with leg stent, still smoking and presented with exertional chest pressure.  He was found to be HTN and I suspect he was not compliant with his meds.  Will resume his meds, be sure he continues with his DUAT, and get his BP under controlled.  Will continue with his high dose statin.  Since he is pain free with no EKG changes, and neg troponin, will hold off on full dose anticoagulation.  He will likely need an ECHO and a nuclear stress test.  Will consult cardiology for further recommendation.   Tobacco use:  Advised quit.  Offered nicotine patch, he declined.  HTN:  Continue with his meds.  Use PRN BB IV for HTN.  HLD:  Max dose of Statin.  Should add fish oil, 4 grams per day also.     DVT prophylaxis: SubQ heparin.  Code Status: FULL CODE.  Family Communication: None at bedside.  Disposition Plan: home likely  Consults called: None.  Admission status: OBS.    Leeza Heiner MD FACP. [facility]  If 7PM-7AM, please contact night-coverage [email or web address] Password [email or web address]  02/17/2016, 11:06 PM

## 2016-02-17 NOTE — ED Triage Notes (Signed)
Pt c/o sob, chest pain and sweating when walking into work. Nurse took his bp and it was 208/109.

## 2016-02-17 NOTE — ED Provider Notes (Signed)
[facility] DEPT Provider Note   CSN: [id number] Arrival date & time: 02/17/16  2042  By signing my name below, I, Dora Sims, attest that this documentation has been prepared under the direction and in the presence of physician practitioner, Milton Ferguson, MD. Electronically Signed: Dora Sims, Scribe. 02/17/2016. 9:43 PM.  History   Chief Complaint Chief Complaint  Patient presents with  . Chest Pain    The history is provided by the patient. No language interpreter was used.  Chest Pain   This is a new problem. The current episode started 1 to 2 hours ago. The problem occurs rarely. The problem has been resolved. Duration of episode(s) is 2 minutes. Associated symptoms include diaphoresis and shortness of breath. Pertinent negatives include no abdominal pain, no back pain, no cough, no headaches and no weakness. He has tried rest for the symptoms. The treatment provided significant relief. Risk factors include male gender.  His past medical history is significant for CAD, hyperlipidemia, hypertension and MI.  Pertinent negatives for past medical history include no seizures.  His family medical history is significant for heart disease and hypertension.  Procedure history is positive for cardiac catheterization.     HPI Comments: Larry Pham is a 50 y.o. male who presents to the Emergency Department complaining of sudden onset, constant, chest pain occurring shortly PTA. He states he was walking into work and experienced chest pain, chest tightness, SOB, and diaphoresis. Pt reports his CP presented for 2 minutes and his diaphoresis lasted for 10 minutes. He states he has experienced similar episodes several times in the past but notes they were all less severe. Pt is concerned for stroke. He denies any of these symptoms currently. Pt notes h/o heart attack x1 and mini-stroke x2. He reports his last cardiac catheterization was on 01/21/16 and he has 2 stents in his legs. 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 Pt denies weakness or any other associated symptoms.  Past Medical History:  Diagnosis Date  . CAD (coronary artery disease)   . Carpal tunnel syndrome   . Coronary atherosclerosis of artery bypass graft   . GERD (gastroesophageal reflux disease)   . History of ETOH abuse    STOPPED 9 MONTHS AGO  . Hyperlipidemia   . Hypertension   . MI (myocardial infarction) 02-2012  . Seasonal allergies     Patient Active Problem List   Diagnosis Date Noted  . PAD (peripheral artery disease) ([place]) 06/23/2015  . Coronary artery disease involving native coronary artery of native heart with angina pectoris ([place])   . Diverticulosis of colon without hemorrhage   . History of colonic polyps   . Screening for colon cancer 03/09/2015  . CAD (coronary artery disease), native coronary artery 06/19/2012  . 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 Tobacco use 06/19/2012  . History of alcohol abuse 06/19/2012  . Hyperlipidemia with target LDL less than 70 06/19/2012  . Essential hypertension 06/19/2012    Past Surgical History:  Procedure Laterality Date  . CARDIAC CATHETERIZATION N/A 04/22/2015   Procedure: Left Heart Cath and Coronary Angiography;  Surgeon: Burnell Blanks, MD;  Location: [facility];  Service: Cardiovascular;  Laterality: N/A;  . CARDIAC CATHETERIZATION N/A 04/22/2015   Procedure: Intravascular Pressure Wire/FFR Study;  Surgeon: Burnell Blanks, MD;  Location: [facility];  Service: Cardiovascular;  Laterality: N/A;  . COLONOSCOPY WITH PROPOFOL N/A 03/23/2015   Procedure: COLONOSCOPY WITH PROPOFOL;  Surgeon: Daneil Dolin, MD;  Location: [facility];  Service: Endoscopy;  Laterality: N/A;  [place] time in 0849  time out  0959  total time 10 mintues  . CORONARY ANGIOPLASTY    . CORONARY ARTERY CATHETERIZATION WITH STENTING N/A 02-2102 AND 05-2012  . LEFT FOREARM SURGERY     s/p machine accident  . LOWER EXTREMITY  ANGIOGRAM Bilateral 07/01/2015   Procedure: Lower Extremity Angiogram;  Surgeon: Wellington Hampshire, MD;  Location: [facility];  Service: Cardiovascular;  Laterality: Bilateral;  . PERIPHERAL VASCULAR CATHETERIZATION N/A 07/01/2015   Procedure: Abdominal Aortogram;  Surgeon: Wellington Hampshire, MD;  Location: [facility];  Service: Cardiovascular;  Laterality: N/A;  . PERIPHERAL VASCULAR CATHETERIZATION Left 07/01/2015   Procedure: Peripheral Vascular Intervention;  Surgeon: Wellington Hampshire, MD;  Location: [facility];  Service: Cardiovascular;  Laterality: Left;  SFA  . POLYPECTOMY N/A 03/23/2015   Procedure: POLYPECTOMY;  Surgeon: Daneil Dolin, MD;  Location: [facility];  Service: Endoscopy;  Laterality: N/A;  cecal, descending colon  . TRIGGER FINGER RELEASE     right 5th digit       Home Medications    Prior to Admission medications   Medication Sig Start Date End Date Taking? Authorizing Provider  albuterol (PROVENTIL HFA;VENTOLIN HFA) 108 (90 BASE) MCG/ACT inhaler Inhale 2 puffs into the lungs every 6 (six) hours as needed for wheezing or shortness of breath. 04/13/15   Arnoldo Lenis, MD  alfuzosin (UROXATRAL) 10 MG 24 hr tablet Take 1 tablet by mouth daily. 05/08/15   Historical Provider, MD  aspirin EC 81 MG tablet Take 81 mg by mouth every morning.    Historical Provider, MD  atorvastatin (LIPITOR) 80 MG tablet Take 80 mg by mouth daily.    Historical Provider, MD  cetirizine (ZYRTEC) 10 MG tablet Take 10 mg by mouth daily as needed for allergies.  02/02/15   Historical Provider, MD  clopidogrel (PLAVIX) 75 MG tablet Take 1 tablet (75 mg total) by mouth once. 07/03/15   Wellington Hampshire, MD  gabapentin (NEURONTIN) 300 MG capsule Take 300 mg by mouth at bedtime.    Historical Provider, MD  hydrochlorothiazide (HYDRODIURIL) 25 MG tablet Take 25 mg by mouth daily.    Historical Provider, MD  isosorbide mononitrate (IMDUR) 30 MG 24 hr tablet Take 30 mg by mouth daily.     Historical Provider, MD  lisinopril (PRINIVIL,ZESTRIL) 40 MG tablet Take 40 mg by mouth daily.    Historical Provider, MD  metoprolol succinate (TOPROL-XL) 50 MG 24 hr tablet Take 1 tablet (50 mg total) by mouth daily. 12/11/14   Arnoldo Lenis, MD  nitroGLYCERIN (NITROSTAT) 0.4 MG SL tablet PLACE 1 TABLET UNDER THE TONGUE & ALLOW TO DISSOLVE EVERY 5 MIN AS NEEDED FOR CHEST PAIN 11/12/15   Arnoldo Lenis, MD  omeprazole (PRILOSEC) 20 MG capsule Take 20 mg by mouth daily.    Historical Provider, MD  ranolazine (RANEXA) 1000 MG SR tablet Take 1 tablet (1,000 mg total) by mouth 2 (two) times daily. 06/09/15   Arnoldo Lenis, MD  SUMAtriptan (IMITREX) 50 MG tablet Take 1 tablet by mouth every 2 (two) hours as needed for migraine or headache.  02/02/15   Historical Provider, MD  tamsulosin (FLOMAX) 0.4 MG CAPS capsule Take 0.4 mg by mouth daily.    Historical Provider, MD  Vitamin D, Ergocalciferol, (DRISDOL) 50000 UNITS CAPS capsule Take 1 capsule by mouth 2 (two) times a week. 02/02/15   Historical  Provider, MD    Family History Family History  Problem Relation Age of Onset  . Hypertension Mother   . Heart disease Mother   . Cirrhosis Father     DIED AT AGE 18  . Breast cancer Paternal Grandmother     DECEASED  . Colon cancer Neg Hx     Social History Social History  Substance Use Topics  . Smoking status: Former Smoker    Packs/day: 1.00    Years: 30.00    Types: Cigarettes    Start date: 11/22/1977    Quit date: 06/30/2015  . Smokeless tobacco: Never Used     Comment: one pack daily  . Alcohol use 0.0 oz/week     Comment: 5-6 beers a day.     Allergies   Bee venom   Review of Systems Review of Systems  Constitutional: Positive for diaphoresis. Negative for appetite change and fatigue.  HENT: Negative for congestion, ear discharge and sinus pressure.   Eyes: Negative for discharge.  Respiratory: Positive for chest tightness and shortness of breath. Negative for cough.     Cardiovascular: Positive for chest pain.  Gastrointestinal: Negative for abdominal pain and diarrhea.  Genitourinary: Negative for frequency and hematuria.  Musculoskeletal: Negative for back pain.  Skin: Negative for rash.  Neurological: Negative for seizures, weakness and headaches.  Psychiatric/Behavioral: Negative for hallucinations.    Physical Exam Updated Vital Signs BP 146/84 (BP Location: Left Arm)   Pulse 63   Temp 98.3 F (36.8 C) (Oral)   Resp 20   Ht 5\' 11"  (1.803 m)   Wt 235 lb (106.6 kg)   SpO2 99%   BMI 32.78 kg/m   Physical Exam  Constitutional: He is oriented to person, place, and time. He appears well-developed.  HENT:  Head: Normocephalic.  Eyes: Conjunctivae and EOM are normal. No scleral icterus.  Neck: Neck supple. No thyromegaly present.  Cardiovascular: Normal rate and regular rhythm.  Exam reveals no gallop and no friction rub.   No murmur heard. Pulmonary/Chest: No stridor. He has no wheezes. He has no rales. He exhibits no tenderness.  Abdominal: He exhibits no distension. There is no tenderness. There is no rebound.  Musculoskeletal: Normal range of motion. He exhibits no edema.  Lymphadenopathy:    He has no cervical adenopathy.  Neurological: He is oriented to person, place, and time. He exhibits normal muscle tone. Coordination normal.  Skin: No rash noted. No erythema.  Psychiatric: He has a normal mood and affect. His behavior is normal.    ED Treatments / Results  Labs (all labs ordered are listed, but only abnormal results are displayed) Labs Reviewed  CBC  BASIC METABOLIC PANEL  TROPONIN I    EKG  EKG Interpretation  Date/Time:  Wednesday February 17 2016 20:57:44 EDT Ventricular Rate:  61 PR Interval:  156 QRS Duration: 98 QT Interval:  398 QTC Calculation: 400 R Axis:   58 Text Interpretation:  Normal sinus rhythm Nonspecific T wave abnormality Abnormal ECG Confirmed by Aariyah Sampey  MD, Majorie Santee [phone]) on 02/17/2016 9:32:27  PM       Radiology Dg Chest 2 View  Result Date: 02/17/2016 CLINICAL DATA:  Chest pressure and shortness of Breath EXAM: CHEST  2 VIEW COMPARISON:  08/03/2013 FINDINGS: The heart size and mediastinal contours are within normal limits. Both lungs are clear. The visualized skeletal structures are unremarkable. IMPRESSION: No acute abnormality noted. Electronically Signed   By: Inez Catalina M.D.   On: 02/17/2016 21:27  Procedures Procedures (including critical care time)  DIAGNOSTIC STUDIES: Oxygen Saturation is 99% on RA, normal by my interpretation.    COORDINATION OF CARE: 9:48 PM Discussed treatment plan with pt at bedside and pt agreed to plan.  Medications Ordered in ED Medications - No data to display   Initial Impression / Assessment and Plan / ED Course  I have reviewed the triage vital signs and the nursing notes.  Pertinent labs & imaging results that were available during my care of the patient were reviewed by me and considered in my medical decision making (see chart for details).  Clinical Course    Chest pain admit for rule out.   The chart was scribed for me under my direct supervision.  I personally performed the history, physical, and medical decision making and all procedures in the evaluation of this patient..   Final Clinical Impressions(s) / ED Diagnoses   Final diagnoses:  None    New Prescriptions New Prescriptions   No medications on file     Milton Ferguson, MD 02/17/16 2231

## 2016-02-18 ENCOUNTER — Observation Stay (HOSPITAL_BASED_OUTPATIENT_CLINIC_OR_DEPARTMENT_OTHER): Payer: BLUE CROSS/BLUE SHIELD

## 2016-02-18 ENCOUNTER — Encounter (HOSPITAL_COMMUNITY): Payer: Self-pay | Admitting: *Deleted

## 2016-02-18 DIAGNOSIS — I10 Essential (primary) hypertension: Secondary | ICD-10-CM

## 2016-02-18 DIAGNOSIS — Z720 Tobacco use: Secondary | ICD-10-CM

## 2016-02-18 DIAGNOSIS — R079 Chest pain, unspecified: Secondary | ICD-10-CM | POA: Diagnosis not present

## 2016-02-18 DIAGNOSIS — I739 Peripheral vascular disease, unspecified: Secondary | ICD-10-CM

## 2016-02-18 DIAGNOSIS — N182 Chronic kidney disease, stage 2 (mild): Secondary | ICD-10-CM | POA: Diagnosis present

## 2016-02-18 DIAGNOSIS — Z9119 Patient's noncompliance with other medical treatment and regimen: Secondary | ICD-10-CM

## 2016-02-18 DIAGNOSIS — F1010 Alcohol abuse, uncomplicated: Secondary | ICD-10-CM | POA: Diagnosis not present

## 2016-02-18 DIAGNOSIS — Z91199 Patient's noncompliance with other medical treatment and regimen due to unspecified reason: Secondary | ICD-10-CM

## 2016-02-18 LAB — ECHOCARDIOGRAM COMPLETE
AOVTI: 28.7 cm
AV Area mean vel: 2.24 cm2
AV VEL mean LVOT/AV: 0.71
AV area mean vel ind: 0.94 cm2/m2
AV vel: 2.13
AVAREAVTI: 2.09 cm2
AVAREAVTIIND: 0.89 cm2/m2
AVG: 4 mmHg
AVLVOTPG: 4 mmHg
AVPG: 10 mmHg
AVPKVEL: 159 cm/s
Ao pk vel: 0.67 m/s
CHL CUP AV PEAK INDEX: 0.87
CHL CUP AV VALUE AREA INDEX: 0.89
CHL CUP MV DEC (S): 236
DOP CAL AO MEAN VELOCITY: 85.5 cm/s
EERAT: 12.19
EWDT: 236 ms
FS: 42 % (ref 28–44)
HEIGHTINCHES: 71 in
IV/PV OW: 1.08
LA diam end sys: 38 mm
LA diam index: 1.59 cm/m2
LASIZE: 38 mm
LAVOL: 63.3 mL
LAVOLA4C: 72.2 mL
LAVOLIN: 26.4 mL/m2
LV E/e'average: 12.19
LV sys vol index: 18 mL/m2
LV sys vol: 44 mL (ref 21–61)
LVDIAVOL: 108 mL (ref 62–150)
LVDIAVOLIN: 45 mL/m2
LVEEMED: 12.19
LVELAT: 7.72 cm/s
LVOT VTI: 19.5 cm
LVOT area: 3.14 cm2
LVOT peak VTI: 0.68 cm
LVOT peak vel: 106 cm/s
LVOTD: 20 mm
LVOTSV: 61 mL
MV pk A vel: 51.9 m/s
MV pk E vel: 94.1 m/s
MVPG: 4 mmHg
PW: 13.1 mm — AB (ref 0.6–1.1)
RV LATERAL S' VELOCITY: 15.1 cm/s
RV TAPSE: 25.3 mm
Simpson's disk: 60
Stroke v: 64 ml
TDI e' lateral: 7.72
TDI e' medial: 6.85
Valve area: 2.13 cm2
WEIGHTICAEL: 3920 [oz_av]

## 2016-02-18 LAB — TROPONIN I: Troponin I: <0.03 ng/mL (ref <0.03)

## 2016-02-18 MED ORDER — INFLUENZA VAC SPLIT QUAD 0.5 ML IM SUSY: 0.5000 mL | PREFILLED_SYRINGE | INTRAMUSCULAR | Status: DC

## 2016-02-18 MED ORDER — ATORVASTATIN CALCIUM 40 MG PO TABS
80.0000 mg | ORAL_TABLET | Freq: Every day | ORAL | Status: DC
  Administered 2016-02-18: 80 mg via ORAL
  Filled 2016-02-18: qty 2

## 2016-02-18 MED ORDER — TAMSULOSIN HCL 0.4 MG PO CAPS
0.4000 mg | ORAL_CAPSULE | Freq: Every day | ORAL | Status: DC
  Administered 2016-02-18: 0.4 mg via ORAL
  Filled 2016-02-18: qty 1

## 2016-02-18 MED ORDER — CLOPIDOGREL BISULFATE 75 MG PO TABS: 75.0000 mg | ORAL_TABLET | Freq: Every day | ORAL | 3 refills | Status: DC

## 2016-02-18 MED ORDER — RANOLAZINE ER 500 MG PO TB12
1000.0000 mg | ORAL_TABLET | Freq: Two times a day (BID) | ORAL | Status: DC
  Administered 2016-02-18: 1000 mg via ORAL
  Filled 2016-02-18: qty 2

## 2016-02-18 MED ORDER — DARIFENACIN HYDROBROMIDE ER 7.5 MG PO TB24
7.5000 mg | ORAL_TABLET | Freq: Every day | ORAL | Status: DC
Start: 2016-02-18 — End: 2016-02-18
  Administered 2016-02-18: 7.5 mg via ORAL
  Filled 2016-02-18: qty 1

## 2016-02-18 MED ORDER — ISOSORBIDE MONONITRATE ER 60 MG PO TB24
30.0000 mg | ORAL_TABLET | Freq: Every day | ORAL | Status: DC
  Administered 2016-02-18: 30 mg via ORAL
  Filled 2016-02-18: qty 1

## 2016-02-18 MED ORDER — ALBUTEROL SULFATE (2.5 MG/3ML) 0.083% IN NEBU: 3.0000 mL | INHALATION_SOLUTION | Freq: Four times a day (QID) | RESPIRATORY_TRACT | Status: DC | PRN

## 2016-02-18 MED ORDER — ASPIRIN EC 81 MG PO TBEC
81.0000 mg | DELAYED_RELEASE_TABLET | Freq: Every morning | ORAL | Status: DC
  Administered 2016-02-18: 81 mg via ORAL
  Filled 2016-02-18: qty 1

## 2016-02-18 MED ORDER — OMEGA-3-ACID ETHYL ESTERS 1 G PO CAPS
1.0000 g | ORAL_CAPSULE | Freq: Two times a day (BID) | ORAL | Status: DC
  Administered 2016-02-18: 1 g via ORAL
  Filled 2016-02-18: qty 1

## 2016-02-18 MED ORDER — METOPROLOL SUCCINATE ER 50 MG PO TB24
50.0000 mg | ORAL_TABLET | Freq: Every day | ORAL | Status: DC
  Administered 2016-02-18: 50 mg via ORAL
  Filled 2016-02-18: qty 1

## 2016-02-18 MED ORDER — PANTOPRAZOLE SODIUM 40 MG PO TBEC
80.0000 mg | DELAYED_RELEASE_TABLET | Freq: Every day | ORAL | Status: DC
Start: 2016-02-18 — End: 2016-02-18
  Administered 2016-02-18: 80 mg via ORAL
  Filled 2016-02-18: qty 2

## 2016-02-18 MED ORDER — ONDANSETRON HCL 4 MG/2ML IJ SOLN: 4.0000 mg | Freq: Four times a day (QID) | INTRAMUSCULAR | Status: DC | PRN

## 2016-02-18 MED ORDER — HEPARIN SODIUM (PORCINE) 5000 UNIT/ML IJ SOLN
5000.0000 [IU] | Freq: Three times a day (TID) | INTRAMUSCULAR | Status: DC
  Administered 2016-02-18 (×2): 5000 [IU] via SUBCUTANEOUS
  Filled 2016-02-18 (×2): qty 1

## 2016-02-18 MED ORDER — ACETAMINOPHEN 650 MG RE SUPP: 650.0000 mg | Freq: Four times a day (QID) | RECTAL | Status: DC | PRN

## 2016-02-18 MED ORDER — ONDANSETRON HCL 4 MG PO TABS: 4.0000 mg | ORAL_TABLET | Freq: Four times a day (QID) | ORAL | Status: DC | PRN

## 2016-02-18 MED ORDER — LISINOPRIL 10 MG PO TABS
40.0000 mg | ORAL_TABLET | Freq: Every day | ORAL | Status: DC
  Administered 2016-02-18: 40 mg via ORAL
  Filled 2016-02-18: qty 8

## 2016-02-18 MED ORDER — ACETAMINOPHEN 325 MG PO TABS: 650.0000 mg | ORAL_TABLET | Freq: Four times a day (QID) | ORAL | Status: DC | PRN

## 2016-02-18 MED ORDER — HYDROCHLOROTHIAZIDE 25 MG PO TABS
25.0000 mg | ORAL_TABLET | Freq: Every day | ORAL | Status: DC
  Administered 2016-02-18: 25 mg via ORAL
  Filled 2016-02-18: qty 1

## 2016-02-18 MED ORDER — GABAPENTIN 300 MG PO CAPS: 300.0000 mg | ORAL_CAPSULE | Freq: Every day | ORAL | Status: DC

## 2016-02-18 MED ORDER — LABETALOL HCL 5 MG/ML IV SOLN: 10.0000 mg | INTRAVENOUS | Status: DC | PRN

## 2016-02-18 MED ORDER — CLOPIDOGREL BISULFATE 75 MG PO TABS
75.0000 mg | ORAL_TABLET | Freq: Once | ORAL | Status: DC
Start: 2016-02-18 — End: 2016-02-18

## 2016-02-18 MED ORDER — ALFUZOSIN HCL ER 10 MG PO TB24
10.0000 mg | ORAL_TABLET | Freq: Every day | ORAL | Status: DC
  Administered 2016-02-18: 10 mg via ORAL
  Filled 2016-02-18 (×2): qty 1

## 2016-02-18 NOTE — Progress Notes (Signed)
Discharged PT per MD order and protocol. Discharge handouts reviewed/explained. Education completed.  Pt verbalized understanding and left with all belongings. VSS. IV catheter D/C. Prescriptions given and explained.   Patient wheeled down by staff member.

## 2016-02-18 NOTE — Progress Notes (Signed)
*  PRELIMINARY RESULTS* Echocardiogram 2D Echocardiogram has been performed.  Larry Pham 02/18/2016, 4:23 PM

## 2016-02-18 NOTE — Discharge Summary (Signed)
Physician Discharge Summary  Larry Pham [id number] DOB: 1965-09-14 DOA: 02/17/2016  PCP: Larry Rival, NP  Admit date: 02/17/2016 Discharge date: 02/18/2016  Time spent: Greater than 30 minutes  Recommendations for Outpatient Follow-up:  1. The patient was instructed to follow-up with Dr. Harl Pham, his primary cardiologist.     Discharge Diagnoses:  1. Chest pain on exertion. Myocardial infarction ruled out. 2. Coronary artery disease, status post PCI and stenting of the RCA. 3. PAD, status post stenting of the SFA 06/2014. 4. Essential hypertension 5. Mild bradycardia. 6. Probable stage II chronic kidney disease, per chart review. 7. Tobacco abuse. The patient was advised to stop smoking. 8. Alcohol abuse. The patient was advised to stop drinking alcohol. 9. Noncompliance.  Discharge Condition: Improved  Diet recommendation: Heart healthy  Filed Weights   02/17/16 2054 02/18/16 0127  Weight: 106.6 kg (235 lb) 111.1 kg (245 lb)    History of present illness:  Patient is a 50 year old man with a history of CAD with stenting to the RCA, PAD with stenting of the SFA, CVA, hypertension, and tobacco abuse, who presented to the ED on 02/17/2016 with a chief complaint of chest pain with exertion. In the ED, he was mildly bradycardic and hypertensive with his blood pressure reportedly greater than [id number] systolically. He was given sublingual nitroglycerin with some improvement. His EKG was unchanged compared to the previous EKG. His chest x-ray revealed no acute abnormalities. His troponin I was negative. His creatinine was 1.29. He was admitted for further evaluation and management.  Hospital Course:  The patient was restarted on all of his chronic medications. Analgesics were ordered as needed for pain. He reported that he had not been compliant with his medications on a daily basis, but stated that he took his medications "most of the time". He also continued to smoke. He was  strongly advised to stop smoking. Nicotine patch was offered, but he declined. For evaluation, a number studies were ordered. History of polio and I was negative 3. His echo revealed an EF of 60-65% and no wall motion abnormalities. Cardiology was consulted. Following their assessment and history, it was revealed that he had been drinking alcohol-4-5 drinks on a daily basis. He demonstrated no signs or symptoms consistent with alcohol withdrawal syndrome, but he was encouraged to stop drinking alcohol altogether. Cardiologist, Dr. Johnsie Pham agreed with medical management, including the restart of Plavix added to aspirin. From his standpoint, since the patient had no acute EKG changes and his troponin I was negative, he believed the patient could be discharged with close follow-up with his primary cardiologist Dr. Harl Pham.   The patient's blood pressure improved. He remained hemodynamically stable. He had mild bradycardia which was likely secondary to the beta blocker. He had no complaints of chest pain during the hospitalization.   Procedures: 2-D echo on 02/18/16: Study Conclusions - Left ventricle: The cavity size was normal. Wall thickness was   increased in a pattern of moderate LVH. Systolic function was   normal. The estimated ejection fraction was in the range of 60%   to 65%. Wall motion was normal; there were no regional wall   motion abnormalities. Left ventricular diastolic function   parameters were normal. - Aortic valve: Valve area (VTI): 2.13 cm^2. Valve area (Vmax):   2.09 cm^2. Valve area (Vmean): 2.24 cm^2. - Left atrium: The atrium was mildly dilated.  - Atrial septum: No defect or patent foramen ovale was identified.  Consultations:  Cardiology  Discharge Exam: Vitals:  02/18/16 1353 02/18/16 1526  BP: (!) 102/58 115/60  Pulse: 60 (!) 58  Resp: 16 15  Temp: 98.4 F (36.9 C)     General: 50 year old African-American man in no acute distress. Cardiovascular: S1, S2,  with soft systolic murmur and borderline bradycardia. Respiratory: Clear to auscultation bilaterally. Extremities: No pedal edema. Psychiatric/neurologic: The patient has a pleasant affect, he is alert and oriented 3. No signs of tremulousness. Cranial nerves II through XII are intact.  Discharge Instructions   Discharge Instructions    Diet - low sodium heart healthy    Complete by:  As directed    Discharge instructions    Complete by:  As directed    Try to stop smoking and drinking.   Increase activity slowly    Complete by:  As directed      Current Discharge Medication List    CONTINUE these medications which have CHANGED   Details  clopidogrel (PLAVIX) 75 MG tablet Take 1 tablet (75 mg total) by mouth daily. Qty: 30 tablet, Refills: 3      CONTINUE these medications which have NOT CHANGED   Details  albuterol (PROVENTIL HFA;VENTOLIN HFA) 108 (90 BASE) MCG/ACT inhaler Inhale 2 puffs into the lungs every 6 (six) hours as needed for wheezing or shortness of breath. Qty: 1 Inhaler, Refills: 2    alfuzosin (UROXATRAL) 10 MG 24 hr tablet Take 1 tablet by mouth daily. Refills: 11    aspirin EC 81 MG tablet Take 81 mg by mouth every morning.    atorvastatin (LIPITOR) 80 MG tablet Take 80 mg by mouth daily.    cetirizine (ZYRTEC) 10 MG tablet Take 10 mg by mouth daily as needed for allergies.  Refills: 12    gabapentin (NEURONTIN) 300 MG capsule Take 300 mg by mouth at bedtime.    hydrochlorothiazide (HYDRODIURIL) 25 MG tablet Take 25 mg by mouth daily.    HYDROcodone-acetaminophen (NORCO) 10-325 MG tablet Take 1 tablet by mouth every 12 (twelve) hours as needed for moderate pain or severe pain.    isosorbide mononitrate (IMDUR) 30 MG 24 hr tablet Take 30 mg by mouth daily.    lisinopril (PRINIVIL,ZESTRIL) 40 MG tablet Take 40 mg by mouth daily.    metoprolol succinate (TOPROL-XL) 50 MG 24 hr tablet Take 1 tablet (50 mg total) by mouth daily. Qty: 30 tablet,  Refills: 6    nitroGLYCERIN (NITROSTAT) 0.4 MG SL tablet PLACE 1 TABLET UNDER THE TONGUE & ALLOW TO DISSOLVE EVERY 5 MIN AS NEEDED FOR CHEST PAIN Qty: 25 tablet, Refills: 3    omeprazole (PRILOSEC) 20 MG capsule Take 20 mg by mouth daily.    ranolazine (RANEXA) 1000 MG SR tablet Take 1 tablet (1,000 mg total) by mouth 2 (two) times daily. Qty: 60 tablet, Refills: 6    solifenacin (VESICARE) 5 MG tablet Take 5 mg by mouth daily.    SUMAtriptan (IMITREX) 50 MG tablet Take 1 tablet by mouth every 2 (two) hours as needed for migraine or headache.  Refills: 0    tamsulosin (FLOMAX) 0.4 MG CAPS capsule Take 0.4 mg by mouth daily.    tiZANidine (ZANAFLEX) 4 MG tablet Take 4 mg by mouth 2 (two) times daily as needed for muscle spasms.    Vitamin D, Ergocalciferol, (DRISDOL) 50000 UNITS CAPS capsule Take 1 capsule by mouth 2 (two) times a week. Refills: 5       Allergies  Allergen Reactions  . Bee Venom Swelling   Follow-up Information  Carlyle Dolly, MD. Schedule an appointment as soon as possible for a visit in 1 week(s).   Specialty:  Cardiology Contact information: [address] [place] [address] [phone]            The results of significant diagnostics from this hospitalization (including imaging, microbiology, ancillary and laboratory) are listed below for reference.    Significant Diagnostic Studies: Dg Chest 2 View  Result Date: 02/17/2016 CLINICAL DATA:  Chest pressure and shortness of Breath EXAM: CHEST  2 VIEW COMPARISON:  08/03/2013 FINDINGS: The heart size and mediastinal contours are within normal limits. Both lungs are clear. The visualized skeletal structures are unremarkable. IMPRESSION: No acute abnormality noted. Electronically Signed   By: Inez Catalina M.D.   On: 02/17/2016 21:27    Microbiology: No results found for this or any previous visit (from the past 240 hour(s)).   Labs: Basic Metabolic Panel:  Recent Labs Lab  02/17/16 2122  NA 139  K 4.0  CL 110  CO2 23  GLUCOSE 89  BUN 15  CREATININE 1.29*  CALCIUM 9.2   Liver Function Tests:  Recent Labs Lab 02/17/16 2122  AST 24  ALT 31  ALKPHOS 89  BILITOT 0.3  PROT 7.0  ALBUMIN 4.0   No results for input(s): LIPASE, AMYLASE in the last 168 hours. No results for input(s): AMMONIA in the last 168 hours. CBC:  Recent Labs Lab 02/17/16 2122  WBC 9.9  HGB 14.0  HCT 41.5  MCV 93.0  PLT 245   Cardiac Enzymes:  Recent Labs Lab 02/17/16 2122 02/18/16 0132 02/18/16 0726 02/18/16 1305  TROPONINI <0.03 <0.03 <0.03 <0.03   BNP: BNP (last 3 results) No results for input(s): BNP in the last 8760 hours.  ProBNP (last 3 results) No results for input(s): PROBNP in the last 8760 hours.  CBG: No results for input(s): GLUCAP in the last 168 hours.     Signed:  Jahfari Ambers MD.  [facility] 02/18/2016, 4:54 PM

## 2016-02-18 NOTE — Consult Note (Signed)
CARDIOLOGY CONSULT NOTE   Patient ID: STEFFEN DOHN MRN: [id number] DOB/AGE: 1965/06/14 50 y.o.  Admit Date: 02/17/2016 Referring Physician: TRH-Fisher  Primary Physician: [name], NP Consulting Cardiologist: Jenkins Rouge Primary Cardiologist: Carlyle Dolly  Reason for Consultation: Chest Pain   Clinical Summary:   Mr. Ramsier is a 50 y.o.male with known history of coronary artery disease, prior PCI of the right coronary artery in the setting of inferior ST elevation MI, he was noted to have distal left main disease with 50% at that time. He was seen by Dr. [name] on 06/09/2015 with continued symptoms. Review of stress test in August 2016 revealed low risk although there was a question of attenuation versus small area of ischemia. A repeat catheterization was completed in December 2016 revealing moderate disease severe small vessel disease with recommendation for medical management. His Ranexa was increased to 1000 mg twice a day.  Other history includes peripheral arterial disease, with bilateral carotid duplex ultrasound completed on 08/03/2015 revealing mild bilateral carotid atherosclerosis with no hemodynamically significant ICA stenosis. hyperlipidemia, hypertension, and ongoing tobacco abuse. He underwent abdominal aortic angiogram with by iliofemoral runoff in February 2017 per Dr.Arida with left SFA stent placement.  He states that he works 7 p to [employer] at [employer] and was walking to his job last evening when he felt chest pressure, diaphoresis, and felt that his blood pressure may be elevated. He stopped by the plant nurse who checked his blood pressure and he was told it was 199/99. The patient states that he does not always take his antihypertensive medications or other medications consistently. He had not taken them for the last few days. He states he took some just before going to work last evening.  He presented to the emergency room with recurrent chest  pain and hypertension. On arrival blood pressure 146/84 heart rate 63 O2 sat 99% temperature 98.3. Creatinine 1.29 other chemistries were unremarkable. He was not found to be anemic or found to have leukocytosis. Troponin was negative 3. EKG revealed sinus bradycardia rate of 53 bpm with intraventricular conduction delay. He was treated with nitroglycerin topical 1 inch. Admitted to rule out ACS. He has been continued on dual antiplatelet therapy with aspirin and Plavix, ACE inhibitor, and beta blocker.  The patient unfortunately continues to smoke a pack or more a day, he also drinks 4 drinks every morning when he gets off work with his friends from whiskey to beer.  Allergies  Allergen Reactions  . Bee Venom Swelling    Medications Scheduled Medications: . alfuzosin  10 mg Oral Daily  . aspirin EC  81 mg Oral q morning - 10a  . atorvastatin  80 mg Oral Daily  . clopidogrel  75 mg Oral Once  . darifenacin  7.5 mg Oral Daily  . gabapentin  300 mg Oral QHS  . heparin  5,000 Units Subcutaneous Q8H  . hydrochlorothiazide  25 mg Oral Daily  . [START ON 02/19/2016] Influenza vac split quadrivalent PF  0.5 mL Intramuscular Tomorrow-1000  . isosorbide mononitrate  30 mg Oral Daily  . lisinopril  40 mg Oral Daily  . metoprolol succinate  50 mg Oral Daily  . omega-3 acid ethyl esters  1 g Oral BID  . pantoprazole  80 mg Oral Daily  . ranolazine  1,000 mg Oral BID  . tamsulosin  0.4 mg Oral Daily     Infusions:     PRN Medications:  acetaminophen **OR** acetaminophen, albuterol, labetalol,  morphine injection, ondansetron **OR** ondansetron (ZOFRAN) IV   Past Medical History:  Diagnosis Date  . CAD (coronary artery disease)   . Carpal tunnel syndrome   . Coronary atherosclerosis of artery bypass graft   . GERD (gastroesophageal reflux disease)   . History of ETOH abuse    STOPPED 9 MONTHS AGO  . Hyperlipidemia   . Hypertension   . MI (myocardial infarction) 02-2012  . Seasonal  allergies     Past Surgical History:  Procedure Laterality Date  . CARDIAC CATHETERIZATION N/A 04/22/2015   Procedure: Left Heart Cath and Coronary Angiography;  Surgeon: Kathleene Hazel, MD;  Location: [facility] INVASIVE CV LAB;  Service: Cardiovascular;  Laterality: N/A;  . CARDIAC CATHETERIZATION N/A 04/22/2015   Procedure: Intravascular Pressure Wire/FFR Study;  Surgeon: Kathleene Hazel, MD;  Location: [facility] INVASIVE CV LAB;  Service: Cardiovascular;  Laterality: N/A;  . COLONOSCOPY WITH PROPOFOL N/A 03/23/2015   Procedure: COLONOSCOPY WITH PROPOFOL;  Surgeon: Corbin Ade, MD;  Location: [facility]S;  Service: Endoscopy;  Laterality: N/A;  Cecum time in 0849  time out  0959  total time 10 mintues  . CORONARY ANGIOPLASTY    . CORONARY ARTERY CATHETERIZATION WITH STENTING N/A 02-2102 AND 05-2012  . LEFT FOREARM SURGERY     s/p machine accident  . LOWER EXTREMITY ANGIOGRAM Bilateral 07/01/2015   Procedure: Lower Extremity Angiogram;  Surgeon: Iran Ouch, MD;  Location: [facility] INVASIVE CV LAB;  Service: Cardiovascular;  Laterality: Bilateral;  . PERIPHERAL VASCULAR CATHETERIZATION N/A 07/01/2015   Procedure: Abdominal Aortogram;  Surgeon: Iran Ouch, MD;  Location: [facility] INVASIVE CV LAB;  Service: Cardiovascular;  Laterality: N/A;  . PERIPHERAL VASCULAR CATHETERIZATION Left 07/01/2015   Procedure: Peripheral Vascular Intervention;  Surgeon: Iran Ouch, MD;  Location: [facility] INVASIVE CV LAB;  Service: Cardiovascular;  Laterality: Left;  SFA  . POLYPECTOMY N/A 03/23/2015   Procedure: POLYPECTOMY;  Surgeon: Corbin Ade, MD;  Location: [facility];  Service: Endoscopy;  Laterality: N/A;  cecal, descending colon  . TRIGGER FINGER RELEASE     right 5th digit    Family History  Problem Relation Age of Onset  . Hypertension Mother   . Heart disease Mother   . Cirrhosis Father     DIED AT AGE 52  . Breast cancer Paternal Grandmother     DECEASED  . Colon cancer Neg Hx      Social  History Mr. Rape reports that he has been smoking Cigarettes.  He started smoking about 38 years ago. He has a 30.00 pack-year smoking history. He has never used smokeless tobacco. Mr. Cart reports that he drinks alcohol.  Review of Systems Complete review of systems are found to be negative unless outlined in H&P above.  Physical Examination Blood pressure 102/65, pulse 69, temperature 97.8 F (36.6 C), temperature source Oral, resp. rate 17, height  (1.803 m), weight 245 lb (111.1 kg), SpO2 99 %.  Intake/Output Summary (Last 24 hours) at 02/18/16 0903 Last data filed at 02/18/16 0100  Gross per 24 hour  Intake                0 ml  Output              300 ml  Net             -300 ml    Telemetry: Sinus bradycardia  GEN: No acute distress HEENT: Conjunctiva and lids normal, oropharynx clear with moist mucosa. Neck: Supple,  no elevated JVP or carotid bruits, no thyromegaly. Lungs: Clear to auscultation, nonlabored breathing at rest. Cardiac: Regular rate and rhythm, no S3 or significant systolic murmur, no pericardial rub. Abdomen: Soft, nontender, no hepatomegaly, bowel sounds present, no guarding or rebound. Extremities: No pitting edema, distal pulses 2+. Skin: Warm and dry. Musculoskeletal: No kyphosis. Neuropsychiatric: Alert and oriented x3, affect grossly appropriate.  Prior Cardiac Testing/Procedures 1. Cardiac cath 04/22/2015 Conclusion    Ost 1st Mrg to 1st Mrg lesion, 50% stenosed.   1. Moderate left main stenosis, not flow limiting by FFR (FFR of 0.93) 2. Patent stents mid RCA with minimal restenosis.  3. Moderate distal RCA stenosis.  4. Moderate ostial LAD stenosis.  5. Severe stenosis small caliber distal AV groove Circumflex beyond the takeoff of the large OM branch. (too small for PCI) 6. Normal LV systolic function  Recommendations: He has moderate disease as described above with severe disease in the small caliber distal Circumflex. This vessel  is too small for PCI. Continue medical management.    Abdominal Aortogram 07/01/2015 Conclusion   1. No significant aortoiliac disease. 2. No significant obstructive disease affecting the right lower extremity except for moderate diffuse disease affecting the anterior tibial and peroneal Arteries.  3. Short occlusion of the mid to distal left SFA with two-vessel runoff below the knee. 4. Successful self-expanding stent placement to the left SFA.   Echocardiogram 04/13/2016 Left ventricle: The cavity size was normal. Wall thickness was   increased in a pattern of mild LVH. Systolic function was normal.   The estimated ejection fraction was in the range of 50% to 55%.   Features are consistent with a pseudonormal left ventricular   filling pattern, with concomitant abnormal relaxation and   increased filling pressure (grade 2 diastolic dysfunction).   Doppler parameters are consistent with high ventricular filling   pressure. - Regional wall motion abnormality: Mild hypokinesis of the mid   inferoseptal, mid inferior, and mid inferolateral myocardium. - Aortic valve: Mildly calcified annulus. Trileaflet. - Mitral valve: Calcified annulus.  Lab Results  Basic Metabolic Panel:  Recent Labs Lab 02/17/16 2122  NA 139  K 4.0  CL 110  CO2 23  GLUCOSE 89  BUN 15  CREATININE 1.29*  CALCIUM 9.2    Liver Function Tests:  Recent Labs Lab 02/17/16 2122  AST 24  ALT 31  ALKPHOS 89  BILITOT 0.3  PROT 7.0  ALBUMIN 4.0    CBC:  Recent Labs Lab 02/17/16 2122  WBC 9.9  HGB 14.0  HCT 41.5  MCV 93.0  PLT 245    Cardiac Enzymes:  Recent Labs Lab 02/17/16 2122 02/18/16 0132 02/18/16 0726  TROPONINI <0.03 <0.03 <0.03   Radiology: Dg Chest 2 View  Result Date: 02/17/2016 CLINICAL DATA:  Chest pressure and shortness of Breath EXAM: CHEST  2 VIEW COMPARISON:  08/03/2013 FINDINGS: The heart size and mediastinal contours are within normal limits. Both lungs are clear.  The visualized skeletal structures are unremarkable. IMPRESSION: No acute abnormality noted. Electronically Signed   By: Inez Catalina M.D.   On: 02/17/2016 21:27     ECG: Normal sinus rhythm with bradycardia, rate of 53 bpm. No acute ST-T wave abnormalities intraventricular conduction delay.   Impression and Recommendations  1. Chest pain: Likely related to hypertension. The patient is been inconsistent with his medication and was found to be hypertensive on arrival to work last evening. He left work and presented to the emergency room. He states that he  took his antihypertensive and other medications prior to going to work but had not taken them for a few days prior to date admission.  He is without complaints of chest pain since arrival. Troponin is negative, EKG is without acute ST-T wave changes arguing against ACS. There was some evidence of prior anterior infarct. The patient will not need a stress test at this time. He can eat. Recommend medical management with consistent compliance. May be able to return home today.   2. CAD: History of stent to the right coronary artery. Small vessel disease with severe disease in the circumflex. Nonobstructive disease elsewhere per catheterization in December 2016. We'll continue dual antiplatelet therapy, beta blocker, statin therapy, and ACE inhibitor.   3. Bradycardia: Noted on admission to the ER. This is unchanged from prior EKGs in 2016.  4. Hypertension: Patient reported hypertension prior to coming to the hospital and was verified by plantar nurse on his arrival to work. He admitted to not taking medications for a few days prior to coming to work but taking them just prior to leaving home that day.  5. Peripheral arterial disease: Stent to the SFA by Dr. Fletcher Anon. February 2016. Continues on dual antiplatelet therapy. Statin therapy.  6. Ongoing tobacco abuse: Discussed cessation with patient he is not inclined to stop smoking at this time. He is  aware of the risks with his known CAD and peripheral arterial disease.  7.  Alcohol abuse: Daily drinking of whiskey and beer greater 4 drinks each day. Consider DVT prophylaxis   Signed: Phill Myron. Lawrence NP [place]  02/18/2016, 9:03 AM Co-Sign MD   Patient examined chart reviewed. Primary issue is ETOH and lack of medical compliance Recent cath 04/2015 with moderate disease to be Rx medically Invasive FFR was normal  And RCA stent patent. Continue medical RX Ok to d/c home once BP better on meds No acute ECG changes and troponin negative. Exam remarkable for S4 gallop Outpatient f/u with Dr Jolyn Lent

## 2016-03-04 ENCOUNTER — Ambulatory Visit: Payer: BLUE CROSS/BLUE SHIELD | Admitting: Urology

## 2016-07-01 ENCOUNTER — Emergency Department (HOSPITAL_COMMUNITY)
Admission: EM | Admit: 2016-07-01 | Discharge: 2016-07-02 | Disposition: A | Discharge status: Home / Self Care | Payer: BLUE CROSS/BLUE SHIELD | Attending: Emergency Medicine | Admitting: Emergency Medicine

## 2016-07-01 ENCOUNTER — Encounter (HOSPITAL_COMMUNITY): Payer: Self-pay | Admitting: Emergency Medicine

## 2016-07-01 ENCOUNTER — Emergency Department (HOSPITAL_COMMUNITY): Payer: BLUE CROSS/BLUE SHIELD

## 2016-07-01 DIAGNOSIS — Y929 Unspecified place or not applicable: Secondary | ICD-10-CM | POA: Diagnosis not present

## 2016-07-01 DIAGNOSIS — Y939 Activity, unspecified: Secondary | ICD-10-CM | POA: Insufficient documentation

## 2016-07-01 DIAGNOSIS — I129 Hypertensive chronic kidney disease with stage 1 through stage 4 chronic kidney disease, or unspecified chronic kidney disease: Secondary | ICD-10-CM | POA: Diagnosis not present

## 2016-07-01 DIAGNOSIS — Y990 Civilian activity done for income or pay: Secondary | ICD-10-CM | POA: Diagnosis not present

## 2016-07-01 DIAGNOSIS — F17210 Nicotine dependence, cigarettes, uncomplicated: Secondary | ICD-10-CM | POA: Diagnosis not present

## 2016-07-01 DIAGNOSIS — Z79899 Other long term (current) drug therapy: Secondary | ICD-10-CM | POA: Diagnosis not present

## 2016-07-01 DIAGNOSIS — J40 Bronchitis, not specified as acute or chronic: Secondary | ICD-10-CM | POA: Insufficient documentation

## 2016-07-01 DIAGNOSIS — X501XXA Overexertion from prolonged static or awkward postures, initial encounter: Secondary | ICD-10-CM | POA: Diagnosis not present

## 2016-07-01 DIAGNOSIS — S161XXA Strain of muscle, fascia and tendon at neck level, initial encounter: Secondary | ICD-10-CM | POA: Insufficient documentation

## 2016-07-01 DIAGNOSIS — Z7982 Long term (current) use of aspirin: Secondary | ICD-10-CM | POA: Insufficient documentation

## 2016-07-01 DIAGNOSIS — I2510 Atherosclerotic heart disease of native coronary artery without angina pectoris: Secondary | ICD-10-CM | POA: Insufficient documentation

## 2016-07-01 DIAGNOSIS — I252 Old myocardial infarction: Secondary | ICD-10-CM | POA: Diagnosis not present

## 2016-07-01 DIAGNOSIS — N182 Chronic kidney disease, stage 2 (mild): Secondary | ICD-10-CM | POA: Insufficient documentation

## 2016-07-01 DIAGNOSIS — S199XXA Unspecified injury of neck, initial encounter: Secondary | ICD-10-CM | POA: Diagnosis present

## 2016-07-01 MED ORDER — PREDNISONE 50 MG PO TABS
60.0000 mg | ORAL_TABLET | Freq: Once | ORAL | Status: AC
  Administered 2016-07-01: 60 mg via ORAL
  Filled 2016-07-01: qty 1

## 2016-07-01 MED ORDER — ALBUTEROL SULFATE (2.5 MG/3ML) 0.083% IN NEBU
INHALATION_SOLUTION | RESPIRATORY_TRACT | Status: AC
  Administered 2016-07-01: 2.5 mg
  Filled 2016-07-01: qty 3

## 2016-07-01 MED ORDER — IPRATROPIUM-ALBUTEROL 0.5-2.5 (3) MG/3ML IN SOLN
3.0000 mL | Freq: Once | RESPIRATORY_TRACT | Status: AC
  Administered 2016-07-01: 3 mL via RESPIRATORY_TRACT
  Filled 2016-07-01: qty 3

## 2016-07-01 MED ORDER — ALBUTEROL SULFATE HFA 108 (90 BASE) MCG/ACT IN AERS
2.0000 | INHALATION_SPRAY | Freq: Once | RESPIRATORY_TRACT | Status: AC
  Administered 2016-07-01: 2 via RESPIRATORY_TRACT
  Filled 2016-07-01: qty 6.7

## 2016-07-01 NOTE — ED Provider Notes (Signed)
[facility] DEPT Provider Note   CSN: [id number] Arrival date & time: 07/01/16  2140   By signing my name below, I, Larry Pham, attest that this documentation has been prepared under the direction and in the presence of Larry Pham, [place]. Electronically Signed: Hilbert Pham, Scribe. 07/01/16. 10:29 PM.  History   Chief Complaint Chief Complaint  Patient presents with  . Neck Pain    The history is provided by the patient. No language interpreter was used.   HPI Comments: Larry Pham is a 51 y.o. male who presents to the Emergency Department complaining of right sided neck pain that began around 7:30 pm tonight. He states that the pain radiates to his right shoulder. He states that the pain lasted for around 10 minutes. The pain worsens when he rotates his neck. 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 He denies taking anything for the pain. The patient reports that he had a pulled muscle in his back a couple of weeks ago.The patient also reports cough, rhinorrhea, and some wheezing. He denies CP, SOB, and fever. He is a current smoker.  Past Medical History:  Diagnosis Date  . CAD (coronary artery disease)   . Carpal tunnel syndrome   . Coronary atherosclerosis of artery bypass graft   . GERD (gastroesophageal reflux disease)   . History of ETOH abuse    STOPPED 9 MONTHS AGO  . Hyperlipidemia   . Hypertension   . MI (myocardial infarction) 02-2012  . Seasonal allergies     Patient Active Problem List   Diagnosis Date Noted  . CKD (chronic kidney disease), stage II 02/18/2016  . ETOH abuse 02/18/2016  . Noncompliance 02/18/2016  . Chest pain on exertion 02/17/2016  . PAD (peripheral artery disease) ([place]) 06/23/2015  . Coronary artery disease involving native coronary artery of native heart with angina pectoris ([place])   . Diverticulosis of colon without hemorrhage   . History of colonic polyps   .  Screening for colon cancer 03/09/2015  . CAD (coronary artery disease), native coronary artery 06/19/2012  . 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 Tobacco use 06/19/2012  . History of alcohol abuse 06/19/2012  . Hyperlipidemia with target LDL less than 70 06/19/2012  . Essential hypertension 06/19/2012    Past Surgical History:  Procedure Laterality Date  . CARDIAC CATHETERIZATION N/A 04/22/2015   Procedure: Left Heart Cath and Coronary Angiography;  Surgeon: Burnell Blanks, MD;  Location: [facility];  Service: Cardiovascular;  Laterality: N/A;  . CARDIAC CATHETERIZATION N/A 04/22/2015   Procedure: Intravascular Pressure Wire/FFR Study;  Surgeon: Burnell Blanks, MD;  Location: [facility];  Service: Cardiovascular;  Laterality: N/A;  . COLONOSCOPY WITH PROPOFOL N/A 03/23/2015   Procedure: COLONOSCOPY WITH PROPOFOL;  Surgeon: Daneil Dolin, MD;  Location: [facility];  Service: Endoscopy;  Laterality: N/A;  [place] time in 0849  time out  0959  total time 10 mintues  . CORONARY ANGIOPLASTY    . CORONARY ARTERY CATHETERIZATION WITH STENTING N/A 02-2102 AND 05-2012  . LEFT FOREARM SURGERY     s/p machine accident  . LOWER EXTREMITY ANGIOGRAM Bilateral 07/01/2015   Procedure: Lower Extremity Angiogram;  Surgeon: Wellington Hampshire, MD;  Location: [facility];  Service: Cardiovascular;  Laterality: Bilateral;  . PERIPHERAL VASCULAR CATHETERIZATION N/A 07/01/2015   Procedure: Abdominal Aortogram;  Surgeon: Wellington Hampshire, MD;  Location: [facility];  Service: Cardiovascular;  Laterality: N/A;  . PERIPHERAL VASCULAR CATHETERIZATION Left 07/01/2015   Procedure: Peripheral Vascular Intervention;  Surgeon: Wellington Hampshire, MD;  Location: [facility];  Service: Cardiovascular;  Laterality: Left;  SFA  . POLYPECTOMY N/A 03/23/2015   Procedure: POLYPECTOMY;  Surgeon: Daneil Dolin, MD;  Location: [facility];  Service: Endoscopy;  Laterality: N/A;   cecal, descending colon  . TRIGGER FINGER RELEASE     right 5th digit       Home Medications    Prior to Admission medications   Medication Sig Start Date End Date Taking? Authorizing Provider  albuterol (PROVENTIL HFA;VENTOLIN HFA) 108 (90 BASE) MCG/ACT inhaler Inhale 2 puffs into the lungs every 6 (six) hours as needed for wheezing or shortness of breath. 04/13/15   Arnoldo Lenis, MD  alfuzosin (UROXATRAL) 10 MG 24 hr tablet Take 1 tablet by mouth daily. 05/08/15   Historical Provider, MD  aspirin EC 81 MG tablet Take 81 mg by mouth every morning.    Historical Provider, MD  atorvastatin (LIPITOR) 80 MG tablet Take 80 mg by mouth daily.    Historical Provider, MD  cetirizine (ZYRTEC) 10 MG tablet Take 10 mg by mouth daily as needed for allergies.  02/02/15   Historical Provider, MD  clopidogrel (PLAVIX) 75 MG tablet Take 1 tablet (75 mg total) by mouth daily. 02/18/16   Rexene Alberts, MD  gabapentin (NEURONTIN) 300 MG capsule Take 300 mg by mouth at bedtime.    Historical Provider, MD  hydrochlorothiazide (HYDRODIURIL) 25 MG tablet Take 25 mg by mouth daily.    Historical Provider, MD  HYDROcodone-acetaminophen (NORCO) 10-325 MG tablet Take 1 tablet by mouth every 12 (twelve) hours as needed for moderate pain or severe pain.    Historical Provider, MD  isosorbide mononitrate (IMDUR) 30 MG 24 hr tablet Take 30 mg by mouth daily.    Historical Provider, MD  lisinopril (PRINIVIL,ZESTRIL) 40 MG tablet Take 40 mg by mouth daily.    Historical Provider, MD  metoprolol succinate (TOPROL-XL) 50 MG 24 hr tablet Take 1 tablet (50 mg total) by mouth daily. 12/11/14   Arnoldo Lenis, MD  nitroGLYCERIN (NITROSTAT) 0.4 MG SL tablet PLACE 1 TABLET UNDER THE TONGUE & ALLOW TO DISSOLVE EVERY 5 MIN AS NEEDED FOR CHEST PAIN 11/12/15   Arnoldo Lenis, MD  omeprazole (PRILOSEC) 20 MG capsule Take 20 mg by mouth daily.    Historical Provider, MD  ranolazine (RANEXA) 1000 MG SR tablet Take 1 tablet (1,000  mg total) by mouth 2 (two) times daily. 06/09/15   Arnoldo Lenis, MD  solifenacin (VESICARE) 5 MG tablet Take 5 mg by mouth daily.    Historical Provider, MD  SUMAtriptan (IMITREX) 50 MG tablet Take 1 tablet by mouth every 2 (two) hours as needed for migraine or headache.  02/02/15   Historical Provider, MD  tamsulosin (FLOMAX) 0.4 MG CAPS capsule Take 0.4 mg by mouth daily.    Historical Provider, MD  tiZANidine (ZANAFLEX) 4 MG tablet Take 4 mg by mouth 2 (two) times daily as needed for muscle spasms.    Historical Provider, MD  Vitamin D, Ergocalciferol, (DRISDOL) 50000 UNITS CAPS capsule Take 1 capsule by mouth 2 (two) times a week. 02/02/15   Historical Provider, MD    Family History Family History  Problem Relation Age of Onset  . Hypertension Mother   . Heart disease Mother   . Cirrhosis Father     DIED  AT AGE 43  . Breast cancer Paternal Grandmother     DECEASED  . Colon cancer Neg Hx     Social History Social History  Substance Use Topics  . Smoking status: Current Every Day Smoker    Packs/day: 1.00    Years: 30.00    Types: Cigarettes    Start date: 11/22/1977  . Smokeless tobacco: Never Used     Comment: one pack daily  . Alcohol use 0.0 oz/week     Comment: occasionally     Allergies   Bee venom   Review of Systems Review of Systems  Constitutional: Negative for appetite change, fatigue and fever.  HENT: Positive for rhinorrhea. Negative for congestion, ear discharge and sinus pressure.   Eyes: Negative for discharge.  Respiratory: Positive for cough and wheezing.   Cardiovascular: Negative for chest pain.  Gastrointestinal: Negative for abdominal pain and diarrhea.  Musculoskeletal: Positive for myalgias and neck pain. Negative for back pain.     Physical Exam Updated Vital Signs BP 121/65 (BP Location: Left Arm)   Pulse 87   Temp 98 F (36.7 C) (Oral)   Resp 18   Ht 5\' 11"  (1.803 m)   Wt 252 lb (114.3 kg)   SpO2 97%   BMI 35.15 kg/m    Physical Exam  Constitutional: He is oriented to person, place, and time. He appears well-developed and well-nourished. No distress.  HENT:  Head: Normocephalic and atraumatic.  Mouth/Throat: Oropharynx is clear and moist.  Eyes: EOM are normal. Pupils are equal, round, and reactive to light.  Neck: Normal range of motion. Neck supple.  Cardiovascular: Normal rate, regular rhythm and intact distal pulses.   No murmur heard. Pulmonary/Chest: Effort normal. He has wheezes.  Expiratory wheezes bilaterally,  No respir distress.  No rales  Abdominal: He exhibits no distension.  Musculoskeletal: Normal range of motion. He exhibits tenderness.  Focal tenderness of the right SCM muscle and trapezius muscle. No motor weakness  Neurological: He is alert and oriented to person, place, and time.  Skin: Skin is warm. No rash noted.  Psychiatric: He has a normal mood and affect.  Nursing note and vitals reviewed.    ED Treatments / Results  DIAGNOSTIC STUDIES: Oxygen Saturation is 97% on RA, normal by my interpretation.    COORDINATION OF CARE: 10:14 PM Discussed treatment plan with pt at bedside and pt agreed to plan. I will check the X-ray of his cervical spine.  Labs (all labs ordered are listed, but only abnormal results are displayed) Labs Reviewed - No data to display  EKG  EKG Interpretation None       Radiology Dg Cervical Spine Complete  Result Date: 07/01/2016 CLINICAL DATA:  51 year old male with right-sided neck pain radiating to shoulder. EXAM: CERVICAL SPINE - COMPLETE 4+ VIEW COMPARISON:  CT dated 09/18/2015 FINDINGS: There is no acute fracture or subluxation of the cervical spine. There are multilevel degenerative changes with anterior osteophyte. Probable bilateral mild neural foramina narrowing at C5-C6. The visualized spinous processes and odontoid appear intact. There is anatomic alignment of the lateral masses of C1 and C2. The soft tissues appear unremarkable.  IMPRESSION: 1. No acute/traumatic cervical spine pathology. 2. Multilevel degenerative changes with probable mild lower cervical neural foramina narrowing. Clinical correlation is recommended. MRI may provide better evaluation of the neural foramina if clinically indicated. Electronically Signed   By: Anner Crete M.D.   On: 07/01/2016 22:51    Procedures Procedures (including critical care time)  Medications Ordered in ED Medications  ipratropium-albuterol (DUONEB) 0.5-2.5 (3) MG/3ML nebulizer solution 3 mL (3 mLs Nebulization Given 07/01/16 2330)  predniSONE (DELTASONE) tablet 60 mg (60 mg Oral Given 07/01/16 2316)  albuterol (PROVENTIL HFA;VENTOLIN HFA) 108 (90 Base) MCG/ACT inhaler 2 puff (2 puffs Inhalation Given 07/01/16 2336)  albuterol (PROVENTIL) (2.5 MG/3ML) 0.083% nebulizer solution (2.5 mg  Given 07/01/16 2330)     Initial Impression / Assessment and Plan / ED Course  MDM:   I have reviewed the triage vital signs and the nursing notes.  Pertinent labs & imaging results that were available during my care of the patient were reviewed by me and considered in my medical decision making (see chart for details).     Pt reviewed on the [facility], no recent medications on file  Pt is well appearing.  Vitals stable.  No focal neuro deficits, no concerning sx's for emergent neurological process, felt to be muscular although I have discussed possible cervical disc issue if not improving with conservative therapy.    On recheck, lung sounds improved after neb.  Albuterol MDI dispensed for home use.   Pt verbalized understanding, agrees to plan.  Dispensed #6 hydrocodone, rx for robaxin and prednisone.  Albuterol MDI also dispensed for his likely bronchitis.    Final Clinical Impressions(s) / ED Diagnoses   Final diagnoses:  Acute strain of neck muscle, initial encounter  Bronchitis    New Prescriptions Discharge Medication List as of 07/02/2016 12:23 AM    START taking these  medications   Details  HYDROcodone-acetaminophen (NORCO/VICODIN) 5-325 MG tablet Take one-two tabs po q 4-6 hrs prn pain, Print    methocarbamol (ROBAXIN) 500 MG tablet Take 1 tablet (500 mg total) by mouth 3 (three) times daily., Starting Sat 07/02/2016, Print    predniSONE (DELTASONE) 20 MG tablet Take 2 tablets (40 mg total) by mouth daily. For 5 days, Starting Sat 07/02/2016, Print       I personally performed the services described in this documentation, which was scribed in my presence. The recorded information has been reviewed and is accurate.    Kem Parkinson, PA-C 07/03/16 [place], DO 07/04/16 [phone]

## 2016-07-01 NOTE — ED Triage Notes (Signed)
Patient states he was at work and bent over to pick up something light when "I felt a pain shoot from the right side of my neck into my shoulder and my head is hurting in my right eye."

## 2016-07-02 MED ORDER — PREDNISONE 20 MG PO TABS: 40.0000 mg | ORAL_TABLET | Freq: Every day | ORAL | 0 refills | Status: DC

## 2016-07-02 MED ORDER — HYDROCODONE-ACETAMINOPHEN 5-325 MG PO TABS: ORAL_TABLET | ORAL | 0 refills | Status: DC

## 2016-07-02 MED ORDER — METHOCARBAMOL 500 MG PO TABS: 500.0000 mg | ORAL_TABLET | Freq: Three times a day (TID) | ORAL | 0 refills | Status: DC

## 2016-07-02 NOTE — Discharge Instructions (Signed)
1-2 puffs of the inhaler every 4-6 hrs as needed.  Apply ice packs on/off to your neck.  Follow-up with your primary doctor for recheck, return here for any worsening symptoms

## 2016-07-05 ENCOUNTER — Other Ambulatory Visit (HOSPITAL_COMMUNITY): Payer: Self-pay | Admitting: Nurse Practitioner

## 2016-07-05 DIAGNOSIS — R937 Abnormal findings on diagnostic imaging of other parts of musculoskeletal system: Secondary | ICD-10-CM

## 2016-07-05 DIAGNOSIS — M546 Pain in thoracic spine: Secondary | ICD-10-CM

## 2016-07-11 MED FILL — Hydrocodone-Acetaminophen Tab 5-325 MG: ORAL | Qty: 6 | Status: AC

## 2016-07-12 ENCOUNTER — Ambulatory Visit (HOSPITAL_COMMUNITY): Admission: RE | Admit: 2016-07-12 | Payer: BLUE CROSS/BLUE SHIELD | Source: Ambulatory Visit

## 2016-07-12 ENCOUNTER — Ambulatory Visit (HOSPITAL_COMMUNITY): Payer: BLUE CROSS/BLUE SHIELD

## 2016-08-04 ENCOUNTER — Ambulatory Visit (HOSPITAL_COMMUNITY)
Admission: RE | Admit: 2016-08-04 | Discharge: 2016-08-04 | Disposition: A | Discharge status: Home / Self Care | Payer: BLUE CROSS/BLUE SHIELD | Source: Ambulatory Visit | Attending: Nurse Practitioner | Admitting: Nurse Practitioner

## 2016-08-04 DIAGNOSIS — M5124 Other intervertebral disc displacement, thoracic region: Secondary | ICD-10-CM | POA: Insufficient documentation

## 2016-08-04 DIAGNOSIS — G629 Polyneuropathy, unspecified: Secondary | ICD-10-CM | POA: Diagnosis not present

## 2016-08-04 DIAGNOSIS — R937 Abnormal findings on diagnostic imaging of other parts of musculoskeletal system: Secondary | ICD-10-CM | POA: Diagnosis not present

## 2016-08-04 DIAGNOSIS — M4802 Spinal stenosis, cervical region: Secondary | ICD-10-CM | POA: Insufficient documentation

## 2016-08-04 DIAGNOSIS — R600 Localized edema: Secondary | ICD-10-CM | POA: Diagnosis not present

## 2016-08-04 DIAGNOSIS — M546 Pain in thoracic spine: Secondary | ICD-10-CM | POA: Diagnosis present

## 2016-08-04 MED ORDER — GADOPENTETATE DIMEGLUMINE 469.01 MG/ML IV SOLN: 20.0000 mL | Freq: Once | INTRAVENOUS | Status: DC | PRN

## 2016-08-04 MED ORDER — GADOBENATE DIMEGLUMINE 529 MG/ML IV SOLN
20.0000 mL | Freq: Once | INTRAVENOUS | Status: AC | PRN
  Administered 2016-08-04: 20 mL via INTRAVENOUS

## 2016-12-01 ENCOUNTER — Emergency Department (HOSPITAL_COMMUNITY)
Admission: EM | Admit: 2016-12-01 | Discharge: 2016-12-02 | Disposition: A | Discharge status: Home / Self Care | Payer: BLUE CROSS/BLUE SHIELD | Attending: Emergency Medicine | Admitting: Emergency Medicine

## 2016-12-01 ENCOUNTER — Encounter (HOSPITAL_COMMUNITY): Payer: Self-pay | Admitting: Emergency Medicine

## 2016-12-01 DIAGNOSIS — R200 Anesthesia of skin: Secondary | ICD-10-CM | POA: Diagnosis present

## 2016-12-01 DIAGNOSIS — F17210 Nicotine dependence, cigarettes, uncomplicated: Secondary | ICD-10-CM | POA: Insufficient documentation

## 2016-12-01 DIAGNOSIS — Z7901 Long term (current) use of anticoagulants: Secondary | ICD-10-CM | POA: Diagnosis not present

## 2016-12-01 DIAGNOSIS — I70211 Atherosclerosis of native arteries of extremities with intermittent claudication, right leg: Secondary | ICD-10-CM | POA: Insufficient documentation

## 2016-12-01 DIAGNOSIS — I959 Hypotension, unspecified: Secondary | ICD-10-CM | POA: Diagnosis not present

## 2016-12-01 DIAGNOSIS — Z79899 Other long term (current) drug therapy: Secondary | ICD-10-CM | POA: Insufficient documentation

## 2016-12-01 DIAGNOSIS — N179 Acute kidney failure, unspecified: Secondary | ICD-10-CM | POA: Diagnosis not present

## 2016-12-01 DIAGNOSIS — F10920 Alcohol use, unspecified with intoxication, uncomplicated: Secondary | ICD-10-CM

## 2016-12-01 DIAGNOSIS — E860 Dehydration: Secondary | ICD-10-CM

## 2016-12-01 DIAGNOSIS — Z7982 Long term (current) use of aspirin: Secondary | ICD-10-CM | POA: Diagnosis not present

## 2016-12-01 DIAGNOSIS — F10120 Alcohol abuse with intoxication, uncomplicated: Secondary | ICD-10-CM | POA: Diagnosis not present

## 2016-12-01 DIAGNOSIS — I739 Peripheral vascular disease, unspecified: Secondary | ICD-10-CM

## 2016-12-01 LAB — CBC WITH DIFFERENTIAL/PLATELET
Basophils Absolute: 0 10*3/uL (ref 0.0–0.1)
Basophils Relative: 0 %
Eosinophils Absolute: 0 10*3/uL (ref 0.0–0.7)
Eosinophils Relative: 0 %
HEMATOCRIT: 39.7 % (ref 39.0–52.0)
HEMOGLOBIN: 13.2 g/dL (ref 13.0–17.0)
LYMPHS ABS: 2.7 10*3/uL (ref 0.7–4.0)
LYMPHS PCT: 27 %
MCH: 31.2 pg (ref 26.0–34.0)
MCHC: 33.2 g/dL (ref 30.0–36.0)
MCV: 93.9 fL (ref 78.0–100.0)
Monocytes Absolute: 0.9 10*3/uL (ref 0.1–1.0)
Monocytes Relative: 9 %
NEUTROS PCT: 64 %
Neutro Abs: 6.2 10*3/uL (ref 1.7–7.7)
Platelets: 221 10*3/uL (ref 150–400)
RBC: 4.23 MIL/uL (ref 4.22–5.81)
RDW: 14 % (ref 11.5–15.5)
WBC: 9.8 10*3/uL (ref 4.0–10.5)

## 2016-12-01 LAB — COMPREHENSIVE METABOLIC PANEL
ALK PHOS: 88 U/L (ref 38–126)
ALT: 35 U/L (ref 17–63)
AST: 26 U/L (ref 15–41)
Albumin: 4 g/dL (ref 3.5–5.0)
Anion gap: 11 (ref 5–15)
BILIRUBIN TOTAL: 0.5 mg/dL (ref 0.3–1.2)
BUN: 27 mg/dL — ABNORMAL HIGH (ref 6–20)
CALCIUM: 8.9 mg/dL (ref 8.9–10.3)
CO2: 20 mmol/L — AB (ref 22–32)
CREATININE: 2.02 mg/dL — AB (ref 0.61–1.24)
Chloride: 102 mmol/L (ref 101–111)
GFR, EST AFRICAN AMERICAN: 42 mL/min — AB (ref >60)
GFR, EST NON AFRICAN AMERICAN: 36 mL/min — AB (ref >60)
Glucose, Bld: 90 mg/dL (ref 65–99)
Potassium: 3.6 mmol/L (ref 3.5–5.1)
Sodium: 133 mmol/L — ABNORMAL LOW (ref 135–145)
TOTAL PROTEIN: 6.9 g/dL (ref 6.5–8.1)

## 2016-12-01 LAB — I-STAT CG4 LACTIC ACID, ED: LACTIC ACID, VENOUS: 1.62 mmol/L (ref 0.5–1.9)

## 2016-12-01 LAB — TROPONIN I

## 2016-12-01 LAB — ETHANOL: Alcohol, Ethyl (B): 192 mg/dL — ABNORMAL HIGH (ref <5)

## 2016-12-01 MED ORDER — SODIUM CHLORIDE 0.9 % IV BOLUS (SEPSIS)
1000.0000 mL | Freq: Once | INTRAVENOUS | Status: AC
  Administered 2016-12-01: 1000 mL via INTRAVENOUS

## 2016-12-01 NOTE — ED Triage Notes (Signed)
Pt c/o intermittent right leg/foot numbness x 2-3 weeks.

## 2016-12-01 NOTE — ED Provider Notes (Signed)
National DEPT Provider Note   CSN: [id number] Arrival date & time: 12/01/16  2130     History   Chief Complaint Chief Complaint  Patient presents with  . Numbness  . Hypotension    HPI Larry Pham is a 51 y.o. male.  HPI Patient with history of coronary artery disease and peripheral vascular disease. States he's had increased tingling and numbness in his right leg with calf cramping over the last few weeks. The symptoms are worse when he walks for extended periods. States he was walking tonight and symptoms began. Minutes to drinking beer with his friends earlier. Denies any focal weakness. States numbness is completely resolved. Has intermittent low back pain but currently denies pain. No chest pain or shortness of breath. No visual changes. Has some hesitancy with urination but is able to urinate.  Past Medical History:  Diagnosis Date  . CAD (coronary artery disease)   . Carpal tunnel syndrome   . Coronary atherosclerosis of artery bypass graft   . GERD (gastroesophageal reflux disease)   . History of ETOH abuse    STOPPED 9 MONTHS AGO  . Hyperlipidemia   . Hypertension   . MI (myocardial infarction) ([place]) 02-2012  . Seasonal allergies     Patient Active Problem List   Diagnosis Date Noted  . CKD (chronic kidney disease), stage II 02/18/2016  . ETOH abuse 02/18/2016  . Noncompliance 02/18/2016  . Chest pain on exertion 02/17/2016  . PAD (peripheral artery disease) ([place]) 06/23/2015  . Coronary artery disease involving native coronary artery of native heart with angina pectoris ([place])   . Diverticulosis of colon without hemorrhage   . History of colonic polyps   . Screening for colon cancer 03/09/2015  . CAD (coronary artery disease), native coronary artery 06/19/2012  . 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 Tobacco use 06/19/2012  . History of alcohol abuse 06/19/2012  . Hyperlipidemia with target LDL less than 70 06/19/2012  . Essential  hypertension 06/19/2012    Past Surgical History:  Procedure Laterality Date  . CARDIAC CATHETERIZATION N/A 04/22/2015   Procedure: Left Heart Cath and Coronary Angiography;  Surgeon: Burnell Blanks, MD;  Location: [facility];  Service: Cardiovascular;  Laterality: N/A;  . CARDIAC CATHETERIZATION N/A 04/22/2015   Procedure: Intravascular Pressure Wire/FFR Study;  Surgeon: Burnell Blanks, MD;  Location: [facility];  Service: Cardiovascular;  Laterality: N/A;  . COLONOSCOPY WITH PROPOFOL N/A 03/23/2015   Procedure: COLONOSCOPY WITH PROPOFOL;  Surgeon: Daneil Dolin, MD;  Location: [facility];  Service: Endoscopy;  Laterality: N/A;  Cecum time in 0849  time out  0959  total time 10 mintues  . CORONARY ANGIOPLASTY    . CORONARY ARTERY CATHETERIZATION WITH STENTING N/A 02-2102 AND 05-2012  . LEFT FOREARM SURGERY     s/p machine accident  . LOWER EXTREMITY ANGIOGRAM Bilateral 07/01/2015   Procedure: Lower Extremity Angiogram;  Surgeon: Wellington Hampshire, MD;  Location: [facility];  Service: Cardiovascular;  Laterality: Bilateral;  . PERIPHERAL VASCULAR CATHETERIZATION N/A 07/01/2015   Procedure: Abdominal Aortogram;  Surgeon: Wellington Hampshire, MD;  Location: [facility];  Service: Cardiovascular;  Laterality: N/A;  . PERIPHERAL VASCULAR CATHETERIZATION Left 07/01/2015   Procedure: Peripheral Vascular Intervention;  Surgeon: Wellington Hampshire, MD;  Location: [facility];  Service: Cardiovascular;  Laterality: Left;  SFA  . POLYPECTOMY N/A 03/23/2015   Procedure: POLYPECTOMY;  Surgeon: Daneil Dolin, MD;  [facility];  Service: Endoscopy;  Laterality: N/A;  cecal, descending colon  . TRIGGER FINGER RELEASE     right 5th digit       Home Medications    Prior to Admission medications   Medication Sig Start Date End Date Taking? Authorizing Provider  albuterol (PROVENTIL HFA;VENTOLIN HFA) 108 (90 BASE) MCG/ACT inhaler Inhale 2 puffs into the  lungs every 6 (six) hours as needed for wheezing or shortness of breath. 04/13/15  Yes BranchAlphonse Guild, MD  alfuzosin (UROXATRAL) 10 MG 24 hr tablet Take 1 tablet by mouth daily. 05/08/15  Yes [provider]  aspirin EC 81 MG tablet Take 81 mg by mouth every morning.   Yes [provider]  atorvastatin (LIPITOR) 80 MG tablet Take 80 mg by mouth daily.   Yes [provider]  clopidogrel (PLAVIX) 75 MG tablet Take 1 tablet (75 mg total) by mouth daily. 02/18/16  Yes Rexene Alberts, MD  gabapentin (NEURONTIN) 600 MG tablet Take 600 mg by mouth daily.    Yes [provider]  hydrochlorothiazide (HYDRODIURIL) 25 MG tablet Take 25 mg by mouth daily.   Yes [provider]  HYDROcodone-acetaminophen (NORCO/VICODIN) 5-325 MG tablet Take one-two tabs po q 4-6 hrs prn pain 07/02/16  Yes Triplett, Tammy, PA-C  isosorbide mononitrate (IMDUR) 30 MG 24 hr tablet Take 30 mg by mouth daily.   Yes [provider]  lisinopril (PRINIVIL,ZESTRIL) 40 MG tablet Take 40 mg by mouth daily.   Yes [provider]  methocarbamol (ROBAXIN) 500 MG tablet Take 1 tablet (500 mg total) by mouth 3 (three) times daily. 07/02/16  Yes Triplett, Tammy, PA-C  metoprolol succinate (TOPROL-XL) 25 MG 24 hr tablet Take 25 mg by mouth daily. 05/15/16  Yes [provider]  nitroGLYCERIN (NITROSTAT) 0.4 MG SL tablet PLACE 1 TABLET UNDER THE TONGUE & ALLOW TO DISSOLVE EVERY 5 MIN AS NEEDED FOR CHEST PAIN 11/12/15  Yes Branch, Alphonse Guild, MD  pantoprazole (PROTONIX) 20 MG tablet Take 20 mg by mouth daily. 05/21/16  Yes [provider]  SUMAtriptan (IMITREX) 50 MG tablet Take 1 tablet by mouth every 2 (two) hours as needed for migraine or headache.  02/02/15  Yes [provider]  tamsulosin (FLOMAX) 0.4 MG CAPS capsule Take 0.4 mg by mouth daily.   Yes [provider]  solifenacin (VESICARE) 5 MG tablet Take 5 mg by mouth daily.    [provider]   Vitamin D, Ergocalciferol, (DRISDOL) 50000 UNITS CAPS capsule Take 1 capsule by mouth 2 (two) times a week. 02/02/15   [provider]    Family History Family History  Problem Relation Age of Onset  . Hypertension Mother   . Heart disease Mother   . Cirrhosis Father        DIED AT AGE 30  . Breast cancer Paternal Grandmother        DECEASED  . Colon cancer Neg Hx     Social History Social History  Substance Use Topics  . Smoking status: Current Every Day Smoker    Packs/day: 1.00    Years: 30.00    Types: Cigarettes    Start date: 11/22/1977  . Smokeless tobacco: Never Used     Comment: one pack daily  . Alcohol use 0.0 oz/week     Comment: occasionally     Allergies   Bee venom   Review of Systems Review of Systems  Constitutional: Negative for chills and fever.  Respiratory: Negative  for cough and shortness of breath.   Cardiovascular: Negative for chest pain, palpitations and leg swelling.  Gastrointestinal: Negative for abdominal pain, constipation, diarrhea, nausea and vomiting.  Genitourinary: Positive for difficulty urinating. Negative for dysuria, flank pain, frequency and hematuria.  Musculoskeletal: Positive for back pain and myalgias. Negative for neck pain and neck stiffness.  Skin: Negative for rash and wound.  Neurological: Positive for numbness. Negative for dizziness, syncope, weakness, light-headedness and headaches.  All other systems reviewed and are negative.    Physical Exam Updated Vital Signs BP (!) 88/53   Pulse 72   Temp 97.6 F (36.4 C) (Oral)   Resp 19   Ht 5\' 11"  (1.803 m)   Wt 111.1 kg (245 lb)   SpO2 99%   BMI 34.17 kg/m   Physical Exam  Constitutional: He is oriented to person, place, and time. He appears well-developed and well-nourished. No distress.  HENT:  Head: Normocephalic and atraumatic.  Mouth/Throat: Oropharynx is clear and moist.  Eyes: Pupils are equal, round, and reactive to light. EOM are normal.    Neck: Normal range of motion. Neck supple.  No meningismus. No posterior midline cervical tenderness to palpation.  Cardiovascular: Normal rate and regular rhythm.  Exam reveals no gallop and no friction rub.   No murmur heard. Pulmonary/Chest: Effort normal and breath sounds normal. No respiratory distress. He has no wheezes. He has no rales. He exhibits no tenderness.  Abdominal: Soft. Bowel sounds are normal. There is no tenderness. There is no rebound and no guarding.  Musculoskeletal: Normal range of motion. He exhibits no edema or tenderness.  No lower extremity asymmetry, erythema. 1+ edema in bilateral lower extremities. Unable to palpate pulses in bilateral feet. Able to obtain dopplerable posterior tibial and dorsalis pedis pulses in bilateral feet. Both feet are warm. There is a small amount pallor of the feet compared to the rest of the skin. No midline thoracic or lumbar tenderness.  Neurological: He is alert and oriented to person, place, and time.  5/5 plantar and dorsiflexion bilaterally. 5/5 bilateral hip flexion. No sensory deficit. Specifically no saddle anesthesia. 5/5 bilateral upper grip strength. Patient is able to without difficulty.  Skin: Skin is warm and dry. Capillary refill takes less than 2 seconds. No rash noted. He is not diaphoretic. No erythema.  Psychiatric: He has a normal mood and affect. His behavior is normal.  Nursing note and vitals reviewed.    ED Treatments / Results  Labs (all labs ordered are listed, but only abnormal results are displayed) Labs Reviewed  COMPREHENSIVE METABOLIC PANEL - Abnormal; Notable for the following:       Result Value   Sodium 133 (*)    CO2 20 (*)    BUN 27 (*)    Creatinine, Ser 2.02 (*)    GFR calc non Af Amer 36 (*)    GFR calc Af Amer 42 (*)    All other components within normal limits  ETHANOL - Abnormal; Notable for the following:    Alcohol, Ethyl (B) 192 (*)    All other components within normal limits   CBC WITH DIFFERENTIAL/PLATELET  TROPONIN I  RAPID URINE DRUG SCREEN, HOSP PERFORMED  URINALYSIS, ROUTINE W REFLEX MICROSCOPIC  I-STAT CG4 LACTIC ACID, ED    EKG  EKG Interpretation  Date/Time:  Thursday December 01 2016 22:11:28 EDT Ventricular Rate:  69 PR Interval:    QRS Duration: 93 QT Interval:  405 QTC Calculation: 434 R Axis:   65  Text Interpretation:  Sinus rhythm Borderline T wave abnormalities Confirmed by Lita Mains  MD, Annye Forrey ([id number]) on 12/01/2016 11:26:36 PM       Radiology No results found.  Procedures Procedures (including critical care time)  Medications Ordered in ED Medications  sodium chloride 0.9 % bolus 1,000 mL (not administered)  sodium chloride 0.9 % bolus 1,000 mL (1,000 mLs Intravenous New Bag/Given 12/01/16 2224)     Initial Impression / Assessment and Plan / ED Course  I have reviewed the triage vital signs and the nursing notes.  Pertinent labs & imaging results that were available during my care of the patient were reviewed by me and considered in my medical decision making (see chart for details).     Patient with complaints of right lower extremity cramping and numbness. It appears consistent with claudication. Patient does have blood flow to the lower extremity. Was noted to be hypotensive in triage. Not complaining of lightheadedness or dizziness. Signed out to oncoming emergency physician pending sobriety, IV fluids and reevaluation. Hypotension is improving with IV fluids. Patient does have some worsening kidney function which is likely related to dehydration. Will need to follow-up with his primary physician as well as his vascular surgeon as an outpatient. Final Clinical Impressions(s) / ED Diagnoses   Final diagnoses:  Claudication of right lower extremity (HCC)  Hypotension, unspecified hypotension type  AKI (acute kidney injury) ([place])  Dehydration  Alcoholic intoxication without complication [facility])    New Prescriptions New  Prescriptions   No medications on file     Julianne Rice, MD 12/01/16 2327

## 2016-12-02 ENCOUNTER — Telehealth: Payer: Self-pay | Admitting: *Deleted

## 2016-12-02 LAB — URINALYSIS, ROUTINE W REFLEX MICROSCOPIC
BILIRUBIN URINE: NEGATIVE
Glucose, UA: NEGATIVE mg/dL
HGB URINE DIPSTICK: NEGATIVE
Ketones, ur: NEGATIVE mg/dL
Leukocytes, UA: NEGATIVE
Nitrite: NEGATIVE
PROTEIN: NEGATIVE mg/dL
Specific Gravity, Urine: 1.016 (ref 1.005–1.030)
pH: 5 (ref 5.0–8.0)

## 2016-12-02 LAB — RAPID URINE DRUG SCREEN, HOSP PERFORMED
AMPHETAMINES: NOT DETECTED
BARBITURATES: NOT DETECTED
BENZODIAZEPINES: NOT DETECTED
Cocaine: NOT DETECTED
Opiates: NOT DETECTED
Tetrahydrocannabinol: NOT DETECTED

## 2016-12-02 NOTE — Telephone Encounter (Signed)
Pt scheduled for 12/16/16 to see Dr Fletcher Anon  Nothing further needed.

## 2016-12-02 NOTE — Telephone Encounter (Signed)
Spoke with patient and he states that he still has some discomfort to his right leg and the muscle is tight like prior to his stent placement. He would like to schedule appointment to see Dr. Fletcher Anon when possible. Let him know that I would have scheduling give him a call and reviewed symptoms in which he should return to ED. He verbalized understanding of our conversation and had no further questions at this time.

## 2016-12-02 NOTE — ED Notes (Signed)
Pt sleeping. Per Dr Dina Rich when pt wakes up and is able to ambulate and completes PO and fluid challenge, pt will be discharged.

## 2016-12-02 NOTE — ED Provider Notes (Signed)
Clinical Course as of Dec 02 437  Fri Dec 02, 2016  0359 Patient resting comfortably. States he feels better. Will ambulate and by mouth challenge.  [CH]    Clinical Course User Index [CH] Icesis Renn, Barbette Hair, MD   Patient signed out pending sobriety and repeat evaluation. In brief, presented with transient hypotension. Responsive to fluids. Noted to have mild acute kidney injury with a creatinine of 2.02. Also acutely intoxicated with a blood alcohol level of 192. On my recheck, he is resting comfortably and states he feels better. Blood pressure is 105.  He is ambulatory. He is not dizzy. Clinically sober. Will discharge home. Encourage hydration.  Follow-up with primary physician.   Merryl Hacker, MD 12/02/16 [phone]

## 2016-12-02 NOTE — ED Notes (Signed)
Pt ambulated, steady gait, no problems noted. Pt given water for PO challenge, tolerated well.

## 2016-12-16 ENCOUNTER — Ambulatory Visit (INDEPENDENT_AMBULATORY_CARE_PROVIDER_SITE_OTHER): Payer: BLUE CROSS/BLUE SHIELD | Admitting: Cardiovascular Disease

## 2016-12-16 ENCOUNTER — Encounter: Payer: Self-pay | Admitting: Cardiovascular Disease

## 2016-12-16 VITALS — BP 110/68 | HR 74 | Ht 71.0 in | Wt 245.5 lb

## 2016-12-16 DIAGNOSIS — M79604 Pain in right leg: Secondary | ICD-10-CM | POA: Diagnosis not present

## 2016-12-16 DIAGNOSIS — I2510 Atherosclerotic heart disease of native coronary artery without angina pectoris: Secondary | ICD-10-CM | POA: Diagnosis not present

## 2016-12-16 DIAGNOSIS — Z720 Tobacco use: Secondary | ICD-10-CM | POA: Diagnosis not present

## 2016-12-16 DIAGNOSIS — I739 Peripheral vascular disease, unspecified: Secondary | ICD-10-CM

## 2016-12-16 DIAGNOSIS — E785 Hyperlipidemia, unspecified: Secondary | ICD-10-CM | POA: Diagnosis not present

## 2016-12-16 DIAGNOSIS — M79605 Pain in left leg: Secondary | ICD-10-CM

## 2016-12-16 NOTE — Progress Notes (Signed)
  Cardiology Office Note   Date:  12/16/2016   ID:  Larry Pham, DOB 08/23/1965, MRN [id number]  PCP:  Strader, Lindsey F, NP  Cardiologist:  Dr. Branch  Chief Complaint  Patient presents with  . other    Pt. c/o right calf pain when walking. Meds reviewed by the pt. verbally.       History of Present Illness: Larry Pham is a 51 y.o. male who presents for a follow-up visit regarding peripheral arterial disease. He has known history of coronary artery disease status post RCA PCI in the setting of inferior ST elevation myocardial infarction in 2013. He had cardiac catheterization done in December 2016 which showed moderate left main stenosis which was not significant by FFR, patent RCA stents and severe stenosis in the small distal left circumflex which was left to be treated medically.He has known history of hyperlipidemia and tobacco use.  He is known to have peripheral arterial disease with claudication. Angiography in February 2017 showed short occlusion of the mid to distal left SFA with two-vessel runoff below the knee. No significant obstructive disease affecting the right lower extremity. I performed successful self-expanding stent placement to the left SFA without complications.   He now complains of severe right calf pain with walking which is happening after about 150 yards. It forces him to stop and rest before he can resume her walking. No symptoms on the left side. He has been taking his medications regularly but unfortunately he continues to smoke.   Past Medical History:  Diagnosis Date  . CAD (coronary artery disease)   . Carpal tunnel syndrome   . Coronary atherosclerosis of artery bypass graft   . GERD (gastroesophageal reflux disease)   . History of ETOH abuse    STOPPED 9 MONTHS AGO  . Hyperlipidemia   . Hypertension   . MI (myocardial infarction) ([facility]) 02-2012  . Seasonal allergies     Past Surgical History:  Procedure Laterality Date  . CARDIAC  CATHETERIZATION N/A 04/22/2015   Procedure: Left Heart Cath and Coronary Angiography;  Surgeon: Christopher D McAlhany, MD;  [facility];  Service: Cardiovascular;  Laterality: N/A;  . CARDIAC CATHETERIZATION N/A 04/22/2015   Procedure: Intravascular Pressure Wire/FFR Study;  Surgeon: Christopher D McAlhany, MD;  Location: [facility];  Service: Cardiovascular;  Laterality: N/A;  . COLONOSCOPY WITH PROPOFOL N/A 03/23/2015   Procedure: COLONOSCOPY WITH PROPOFOL;  Surgeon: Robert M Rourk, MD;  Location: [facility];  Service: Endoscopy;  Laterality: N/A;  Cecum time in 0849  time out  0959  total time 10 mintues  . CORONARY ANGIOPLASTY    . CORONARY ARTERY CATHETERIZATION WITH STENTING N/A 02-2102 AND 05-2012  . LEFT FOREARM SURGERY     s/p machine accident  . LOWER EXTREMITY ANGIOGRAM Bilateral 07/01/2015   Procedure: Lower Extremity Angiogram;  Surgeon: Muhammad A Arida, MD;  [facility]: [facility];  Service: Cardiovascular;  Laterality: Bilateral;  . PERIPHERAL VASCULAR CATHETERIZATION N/A 07/01/2015   Procedure: Abdominal Aortogram;  Surgeon: Muhammad A Arida, MD;  [facility]: [facility];  Service: Cardiovascular;  Laterality: N/A;  . PERIPHERAL VASCULAR CATHETERIZATION Left 07/01/2015   Procedure: Peripheral Vascular Intervention;  Surgeon: Muhammad A Arida, MD;  [facility];  Service: Cardiovascular;  Laterality: Left;  SFA  . POLYPECTOMY N/A 03/23/2015   Procedure: POLYPECTOMY;  Surgeon: Robert M Rourk, MD;  Location: [facility];  Service: Endoscopy;  Laterality: N/A;  cecal, descending   colon  . TRIGGER FINGER RELEASE     right 5th digit     Current Outpatient Prescriptions  Medication Sig Dispense Refill  . albuterol (PROVENTIL HFA;VENTOLIN HFA) 108 (90 BASE) MCG/ACT inhaler Inhale 2 puffs into the lungs every 6 (six) hours as needed for wheezing or shortness of breath. 1 Inhaler 2  . alfuzosin (UROXATRAL) 10 MG 24 hr tablet Take 1 tablet by  mouth daily.  11  . aspirin EC 81 MG tablet Take 81 mg by mouth every morning.    . atorvastatin (LIPITOR) 80 MG tablet Take 80 mg by mouth daily.    . clopidogrel (PLAVIX) 75 MG tablet Take 1 tablet (75 mg total) by mouth daily. 30 tablet 3  . gabapentin (NEURONTIN) 600 MG tablet Take 600 mg by mouth daily.     . hydrochlorothiazide (HYDRODIURIL) 25 MG tablet Take 25 mg by mouth daily.    . HYDROcodone-acetaminophen (NORCO/VICODIN) 5-325 MG tablet Take one-two tabs po q 4-6 hrs prn pain 6 tablet 0  . isosorbide mononitrate (IMDUR) 30 MG 24 hr tablet Take 30 mg by mouth daily.    . lisinopril (PRINIVIL,ZESTRIL) 40 MG tablet Take 40 mg by mouth daily.    . methocarbamol (ROBAXIN) 500 MG tablet Take 1 tablet (500 mg total) by mouth 3 (three) times daily. 21 tablet 0  . metoprolol succinate (TOPROL-XL) 25 MG 24 hr tablet Take 25 mg by mouth daily.  5  . nitroGLYCERIN (NITROSTAT) 0.4 MG SL tablet PLACE 1 TABLET UNDER THE TONGUE & ALLOW TO DISSOLVE EVERY 5 MIN AS NEEDED FOR CHEST PAIN 25 tablet 3  . pantoprazole (PROTONIX) 20 MG tablet Take 20 mg by mouth daily.  5  . solifenacin (VESICARE) 5 MG tablet Take 5 mg by mouth daily.    . SUMAtriptan (IMITREX) 50 MG tablet Take 1 tablet by mouth every 2 (two) hours as needed for migraine or headache.   0  . tamsulosin (FLOMAX) 0.4 MG CAPS capsule Take 0.4 mg by mouth daily.    . Vitamin D, Ergocalciferol, (DRISDOL) 50000 UNITS CAPS capsule Take 1 capsule by mouth 2 (two) times a week.  5   No current facility-administered medications for this visit.     Allergies:   Bee venom    Social History:  The patient  reports that he has been smoking Cigarettes.  He started smoking about 39 years ago. He has a 30.00 pack-year smoking history. He has never used smokeless tobacco. He reports that he drinks alcohol. He reports that he does not use drugs.   Family History:  The patient's family history includes Breast cancer in his paternal grandmother; Cirrhosis  in his father; Heart disease in his mother; Hypertension in his mother.    ROS:  Please see the history of present illness.   Otherwise, review of systems are positive for none.   All other systems are reviewed and negative.    PHYSICAL EXAM: VS:  BP 110/68 (BP Location: Left Arm, Patient Position: Sitting, Cuff Size: Normal)   Pulse 74   Ht 5' 11" (1.803 m)   Wt 245 lb 8 oz (111.4 kg)   BMI 34.24 kg/m  , BMI Body mass index is 34.24 kg/m. GEN: Well nourished, well developed, in no acute distress  HEENT: normal  Neck: no JVD, carotid bruits, or masses Cardiac: RRR; no murmurs, rubs, or gallops,no edema  Respiratory:  clear to auscultation bilaterally, normal work of breathing GI: soft, nontender, nondistended, + BS MS: no   deformity or atrophy  Skin: warm and dry, no rash Neuro:  Strength and sensation are intact Psych: euthymic mood, full affect Vascular: Femoral pulses are normal bilaterally with no evidence of hematoma. Posterior tibial pulses palpable on the left side and very faint on the right side     Recent Labs: 12/01/2016: ALT 35; BUN 27; Creatinine, Ser 2.02; Hemoglobin 13.2; Platelets 221; Potassium 3.6; Sodium 133    Lipid Panel No results found for: CHOL, TRIG, HDL, CHOLHDL, VLDL, LDLCALC, LDLDIRECT    Wt Readings from Last 3 Encounters:  12/16/16 245 lb 8 oz (111.4 kg)  12/01/16 245 lb (111.1 kg)  07/01/16 252 lb (114.3 kg)      Other studies Reviewed: Additional studies/ records that were reviewed today include: Noninvasive vascular studies from today. Review of the above records demonstrates: Improvement in ABI on the left side at 0.98 with patent stent on duplex.   ASSESSMENT AND PLAN:  1.  Peripheral arterial disease: The patient had previous left SFA angioplasty and stent placement with resolution of claudication. Unfortunately he is now having severe right calf claudication. No evidence of critical limb ischemia. I requested lower extremity  arterial Doppler and given severity of his symptoms, he will likely require angiography and possible endovascular intervention.  Continue dual antiplatelet therapy for now.  2. Tobacco use: He quit smoking before but unfortunately he relapsed. I discussed with him the importance of smoking cessation again.  3. Hyperlipidemia: Continue treatment with atorvastatin with a target LDL of less than 70.  4. Coronary artery disease: Currently he has no symptoms suggestive of angina. Continue medical therapy.    Disposition:   FU with me in 1 months  Signed,  Muhammad Arida, MD  12/16/2016 10:56 AM    [facility]  

## 2016-12-16 NOTE — Patient Instructions (Addendum)
Medication Instructions:  Your physician recommends that you continue on your current medications as directed. Please refer to the Current Medication list given to you today.   Labwork: none  Testing/Procedures: Your physician has requested that you have a lower extremity arterial duplex. During this test, exercise and ultrasound are used to evaluate arterial blood flow in the legs. Allow one hour for this exam. There are no restrictions or special instructions.   Follow-Up: Your physician recommends that you schedule a follow up based on the results of your test.    Any Other Special Instructions Will Be Listed Below (If Applicable).     If you need a refill on your cardiac medications before your next appointment, please call your pharmacy.   Steps to Quit Smoking Smoking tobacco can be bad for your health. It can also affect almost every organ in your body. Smoking puts you and people around you at risk for many serious long-lasting (chronic) diseases. Quitting smoking is hard, but it is one of the best things that you can do for your health. It is never too late to quit. What are the benefits of quitting smoking? When you quit smoking, you lower your risk for getting serious diseases and conditions. They can include:  Lung cancer or lung disease.  Heart disease.  Stroke.  Heart attack.  Not being able to have children (infertility).  Weak bones (osteoporosis) and broken bones (fractures).  If you have coughing, wheezing, and shortness of breath, those symptoms may get better when you quit. You may also get sick less often. If you are pregnant, quitting smoking can help to lower your chances of having a baby of low birth weight. What can I do to help me quit smoking? Talk with your doctor about what can help you quit smoking. Some things you can do (strategies) include:  Quitting smoking totally, instead of slowly cutting back how much you smoke over a period of  time.  Going to in-person counseling. You are more likely to quit if you go to many counseling sessions.  Using resources and support systems, such as: ? Database administrator with a Social worker. ? Phone quitlines. ? Careers information officer. ? Support groups or group counseling. ? Text messaging programs. ? Mobile phone apps or applications.  Taking medicines. Some of these medicines may have nicotine in them. If you are pregnant or breastfeeding, do not take any medicines to quit smoking unless your doctor says it is okay. Talk with your doctor about counseling or other things that can help you.  Talk with your doctor about using more than one strategy at the same time, such as taking medicines while you are also going to in-person counseling. This can help make quitting easier. What things can I do to make it easier to quit? Quitting smoking might feel very hard at first, but there is a lot that you can do to make it easier. Take these steps:  Talk to your family and friends. Ask them to support and encourage you.  Call phone quitlines, reach out to support groups, or work with a Social worker.  Ask people who smoke to not smoke around you.  Avoid places that make you want (trigger) to smoke, such as: ? Bars. ? Parties. ? Smoke-break areas at work.  Spend time with people who do not smoke.  Lower the stress in your life. Stress can make you want to smoke. Try these things to help your stress: ? Getting regular exercise. ? Deep-breathing  exercises. ? Yoga. ? Meditating. ? Doing a body scan. To do this, close your eyes, focus on one area of your body at a time from head to toe, and notice which parts of your body are tense. Try to relax the muscles in those areas.  Download or buy apps on your mobile phone or tablet that can help you stick to your quit plan. There are many free apps, such as QuitGuide from the [employer] Office manager for Disease Control and Prevention). You can find more support from  smokefree.gov and other websites.  This information is not intended to replace advice given to you by your health care provider. Make sure you discuss any questions you have with your health care provider. Document Released: 02/19/2009 Document Revised: 12/22/2015 Document Reviewed: 09/09/2014 Elsevier Interactive Patient Education  2018 [place] with Quitting Smoking Quitting smoking is a physical and mental challenge. You will face cravings, withdrawal symptoms, and temptation. Before quitting, work with your health care provider to make a plan that can help you cope. Preparation can help you quit and keep you from giving in. How can I cope with cravings? Cravings usually last for 5-10 minutes. If you get through it, the craving will pass. Consider taking the following actions to help you cope with cravings:  Keep your mouth busy: ? Chew sugar-free gum. ? Suck on hard candies or a straw. ? Brush your teeth.  Keep your hands and body busy: ? Immediately change to a different activity when you feel a craving. ? Squeeze or play with a ball. ? Do an activity or a hobby, like making bead jewelry, practicing needlepoint, or working with wood. ? Mix up your normal routine. ? Take a short exercise break. Go for a quick walk or run up and down stairs. ? Spend time in public places where smoking is not allowed.  Focus on doing something kind or helpful for someone else.  Call a friend or family member to talk during a craving.  Join a support group.  Call a quit line, such as 1-800-QUIT-NOW.  Talk with your health care provider about medicines that might help you cope with cravings and make quitting easier for you.  How can I deal with withdrawal symptoms? Your body may experience negative effects as it tries to get used to not having nicotine in the system. These effects are called withdrawal symptoms. They may include:  Feeling hungrier than normal.  Trouble  concentrating.  Irritability.  Trouble sleeping.  Feeling depressed.  Restlessness and agitation.  Craving a cigarette.  To manage withdrawal symptoms:  Avoid places, people, and activities that trigger your cravings.  Remember why you want to quit.  Get plenty of sleep.  Avoid coffee and other caffeinated drinks. These may worsen some of your symptoms.  How can I handle social situations? Social situations can be difficult when you are quitting smoking, especially in the first few weeks. To manage this, you can:  Avoid parties, bars, and other social situations where people might be smoking.  Avoid alcohol.  Leave right away if you have the urge to smoke.  Explain to your family and friends that you are quitting smoking. Ask for understanding and support.  Plan activities with friends or family where smoking is not an option.  What are some ways I can cope with stress? Wanting to smoke may cause stress, and stress can make you want to smoke. Find ways to manage your stress. Relaxation techniques can help.  For example:  Breathe slowly and deeply, in through your nose and out through your mouth.  Listen to soothing, relaxing music.  Talk with a family member or friend about your stress.  Light a candle.  Soak in a bath or take a shower.  Think about a peaceful place.  What are some ways I can prevent weight gain? Be aware that many people gain weight after they quit smoking. However, not everyone does. To keep from gaining weight, have a plan in place before you quit and stick to the plan after you quit. Your plan should include:  Having healthy snacks. When you have a craving, it may help to: ? Eat plain popcorn, crunchy carrots, celery, or other cut vegetables. ? Chew sugar-free gum.  Changing how you eat: ? Eat small portion sizes at meals. ? Eat 4-6 small meals throughout the day instead of 1-2 large meals a day. ? Be mindful when you eat. Do not watch  television or do other things that might distract you as you eat.  Exercising regularly: ? Make time to exercise each day. If you do not have time for a long workout, do short bouts of exercise for 5-10 minutes several times a day. ? Do some form of strengthening exercise, like weight lifting, and some form of aerobic exercise, like running or swimming.  Drinking plenty of water or other low-calorie or no-calorie drinks. Drink 6-8 glasses of water daily, or as much as instructed by your health care provider.  Summary  Quitting smoking is a physical and mental challenge. You will face cravings, withdrawal symptoms, and temptation to smoke again. Preparation can help you as you go through these challenges.  You can cope with cravings by keeping your mouth busy (such as by chewing gum), keeping your body and hands busy, and making calls to family, friends, or a helpline for people who want to quit smoking.  You can cope with withdrawal symptoms by avoiding places where people smoke, avoiding drinks with caffeine, and getting plenty of rest.  Ask your health care provider about the different ways to prevent weight gain, avoid stress, and handle social situations. This information is not intended to replace advice given to you by your health care provider. Make sure you discuss any questions you have with your health care provider. Document Released: 04/22/2016 Document Revised: 04/22/2016 Document Reviewed: 04/22/2016 Elsevier Interactive Patient Education  [employer].

## 2016-12-19 ENCOUNTER — Other Ambulatory Visit: Payer: Self-pay | Admitting: Cardiovascular Disease

## 2016-12-19 DIAGNOSIS — I739 Peripheral vascular disease, unspecified: Secondary | ICD-10-CM

## 2016-12-26 ENCOUNTER — Ambulatory Visit: Payer: BLUE CROSS/BLUE SHIELD

## 2016-12-26 DIAGNOSIS — I739 Peripheral vascular disease, unspecified: Secondary | ICD-10-CM | POA: Diagnosis not present

## 2016-12-30 ENCOUNTER — Encounter: Payer: Self-pay | Admitting: *Deleted

## 2016-12-30 ENCOUNTER — Telehealth: Payer: Self-pay | Admitting: *Deleted

## 2016-12-30 DIAGNOSIS — I739 Peripheral vascular disease, unspecified: Secondary | ICD-10-CM

## 2016-12-30 DIAGNOSIS — Z01818 Encounter for other preprocedural examination: Secondary | ICD-10-CM

## 2016-12-30 NOTE — Telephone Encounter (Signed)
Procedure scheduled for 01-04-17 @ 8:30 am, patient will arrive @ 6:30am. He will come to the [place] office 01-02-17 for lab work. Instructions and location date and time discussed in detail with the patient. Patient voiced understanding

## 2016-12-30 NOTE — Telephone Encounter (Signed)
Follow up ° ° ° ° ° °Returning a call to the nurse °

## 2016-12-30 NOTE — Telephone Encounter (Signed)
Spoke with pt, aware of results. He understands the procedure and would like to proceed asap. Will make arrangements and call the patient back.

## 2016-12-30 NOTE — Telephone Encounter (Addendum)
-----   Message from Wellington Hampshire, MD sent at 12/27/2016  6:28 PM EDT ----- Inform patient that Doppler showed decreased ABI on the right side with new SFA stenosis likely causing his right calf pain with walking. As we discussed during his office visit, I recommend proceeding with abdominal aortogram with lower extremity runoff and possible endovascular intervention. This can be scheduled next week on Wednesday.   Left message for pt to call

## 2016-12-31 ENCOUNTER — Other Ambulatory Visit: Payer: Self-pay | Admitting: Cardiovascular Disease

## 2017-01-02 ENCOUNTER — Other Ambulatory Visit
Admission: RE | Admit: 2017-01-02 | Discharge: 2017-01-02 | Disposition: A | Discharge status: Home / Self Care | Payer: BLUE CROSS/BLUE SHIELD | Source: Ambulatory Visit | Attending: Cardiovascular Disease | Admitting: Cardiovascular Disease

## 2017-01-02 DIAGNOSIS — Z01818 Encounter for other preprocedural examination: Secondary | ICD-10-CM | POA: Insufficient documentation

## 2017-01-02 DIAGNOSIS — I739 Peripheral vascular disease, unspecified: Secondary | ICD-10-CM | POA: Insufficient documentation

## 2017-01-02 LAB — CBC
HEMATOCRIT: 44 % (ref 40.0–52.0)
HEMOGLOBIN: 14.8 g/dL (ref 13.0–18.0)
MCH: 31.2 pg (ref 26.0–34.0)
MCHC: 33.6 g/dL (ref 32.0–36.0)
MCV: 93 fL (ref 80.0–100.0)
Platelets: 252 10*3/uL (ref 150–440)
RBC: 4.73 MIL/uL (ref 4.40–5.90)
RDW: 14.3 % (ref 11.5–14.5)
WBC: 8.6 10*3/uL (ref 3.8–10.6)

## 2017-01-02 LAB — BASIC METABOLIC PANEL
Anion gap: 9 (ref 5–15)
BUN: 21 mg/dL — AB (ref 6–20)
CALCIUM: 9.5 mg/dL (ref 8.9–10.3)
CHLORIDE: 102 mmol/L (ref 101–111)
CO2: 23 mmol/L (ref 22–32)
CREATININE: 1.03 mg/dL (ref 0.61–1.24)
GFR calc Af Amer: >60 mL/min (ref >60)
GFR calc non Af Amer: >60 mL/min (ref >60)
GLUCOSE: 87 mg/dL (ref 65–99)
Potassium: 4.3 mmol/L (ref 3.5–5.1)
Sodium: 134 mmol/L — ABNORMAL LOW (ref 135–145)

## 2017-01-02 LAB — PROTIME-INR
INR: 1.03
Prothrombin Time: 13.5 seconds (ref 11.4–15.2)

## 2017-01-04 ENCOUNTER — Encounter (HOSPITAL_COMMUNITY)
Admission: RE | Disposition: A | Discharge status: Home / Self Care | Payer: Self-pay | Source: Ambulatory Visit | Attending: Cardiovascular Disease

## 2017-01-04 ENCOUNTER — Ambulatory Visit (HOSPITAL_COMMUNITY)
Admission: RE | Admit: 2017-01-04 | Discharge: 2017-01-04 | Disposition: A | Discharge status: Home / Self Care | Payer: BLUE CROSS/BLUE SHIELD | Source: Ambulatory Visit | Attending: Cardiovascular Disease | Admitting: Cardiovascular Disease

## 2017-01-04 DIAGNOSIS — F17210 Nicotine dependence, cigarettes, uncomplicated: Secondary | ICD-10-CM | POA: Diagnosis not present

## 2017-01-04 DIAGNOSIS — I2510 Atherosclerotic heart disease of native coronary artery without angina pectoris: Secondary | ICD-10-CM | POA: Diagnosis not present

## 2017-01-04 DIAGNOSIS — Y838 Other surgical procedures as the cause of abnormal reaction of the patient, or of later complication, without mention of misadventure at the time of the procedure: Secondary | ICD-10-CM | POA: Diagnosis not present

## 2017-01-04 DIAGNOSIS — Z7982 Long term (current) use of aspirin: Secondary | ICD-10-CM | POA: Diagnosis not present

## 2017-01-04 DIAGNOSIS — Z955 Presence of coronary angioplasty implant and graft: Secondary | ICD-10-CM | POA: Insufficient documentation

## 2017-01-04 DIAGNOSIS — T82856A Stenosis of peripheral vascular stent, initial encounter: Secondary | ICD-10-CM | POA: Insufficient documentation

## 2017-01-04 DIAGNOSIS — I10 Essential (primary) hypertension: Secondary | ICD-10-CM | POA: Insufficient documentation

## 2017-01-04 DIAGNOSIS — I252 Old myocardial infarction: Secondary | ICD-10-CM | POA: Insufficient documentation

## 2017-01-04 DIAGNOSIS — Z7983 Long term (current) use of bisphosphonates: Secondary | ICD-10-CM | POA: Insufficient documentation

## 2017-01-04 DIAGNOSIS — Z7902 Long term (current) use of antithrombotics/antiplatelets: Secondary | ICD-10-CM | POA: Diagnosis not present

## 2017-01-04 DIAGNOSIS — E785 Hyperlipidemia, unspecified: Secondary | ICD-10-CM | POA: Diagnosis not present

## 2017-01-04 DIAGNOSIS — I70201 Unspecified atherosclerosis of native arteries of extremities, right leg: Secondary | ICD-10-CM | POA: Diagnosis present

## 2017-01-04 DIAGNOSIS — Z79899 Other long term (current) drug therapy: Secondary | ICD-10-CM | POA: Diagnosis not present

## 2017-01-04 DIAGNOSIS — I70213 Atherosclerosis of native arteries of extremities with intermittent claudication, bilateral legs: Secondary | ICD-10-CM | POA: Diagnosis not present

## 2017-01-04 DIAGNOSIS — I70211 Atherosclerosis of native arteries of extremities with intermittent claudication, right leg: Secondary | ICD-10-CM | POA: Insufficient documentation

## 2017-01-04 HISTORY — PX: ABDOMINAL AORTOGRAM W/LOWER EXTREMITY: CATH118223

## 2017-01-04 SURGERY — ABDOMINAL AORTOGRAM W/LOWER EXTREMITY
Anesthesia: LOCAL

## 2017-01-04 MED ORDER — LIDOCAINE HCL (PF) 1 % IJ SOLN
INTRAMUSCULAR | Status: AC
  Filled 2017-01-04: qty 30

## 2017-01-04 MED ORDER — SODIUM CHLORIDE 0.9% FLUSH: 3.0000 mL | Freq: Two times a day (BID) | INTRAVENOUS | Status: DC

## 2017-01-04 MED ORDER — SODIUM CHLORIDE 0.9% FLUSH: 3.0000 mL | INTRAVENOUS | Status: DC | PRN

## 2017-01-04 MED ORDER — MIDAZOLAM HCL 2 MG/2ML IJ SOLN
INTRAMUSCULAR | Status: DC | PRN
  Administered 2017-01-04: 1 mg via INTRAVENOUS

## 2017-01-04 MED ORDER — LIDOCAINE HCL (PF) 1 % IJ SOLN
INTRAMUSCULAR | Status: DC | PRN
  Administered 2017-01-04: 20 mL

## 2017-01-04 MED ORDER — HEPARIN (PORCINE) IN NACL 2-0.9 UNIT/ML-% IJ SOLN
INTRAMUSCULAR | Status: AC
  Filled 2017-01-04: qty 1000

## 2017-01-04 MED ORDER — IODIXANOL 320 MG/ML IV SOLN
INTRAVENOUS | Status: DC | PRN
  Administered 2017-01-04: 100 mL via INTRA_ARTERIAL

## 2017-01-04 MED ORDER — SODIUM CHLORIDE 0.9 % IV SOLN: 250.0000 mL | INTRAVENOUS | Status: DC | PRN

## 2017-01-04 MED ORDER — ASPIRIN 81 MG PO CHEW: 81.0000 mg | CHEWABLE_TABLET | ORAL | Status: DC

## 2017-01-04 MED ORDER — HEPARIN (PORCINE) IN NACL 2-0.9 UNIT/ML-% IJ SOLN
INTRAMUSCULAR | Status: AC | PRN
  Administered 2017-01-04: 1000 mL

## 2017-01-04 MED ORDER — SODIUM CHLORIDE 0.9 % IV SOLN: INTRAVENOUS | Status: AC

## 2017-01-04 MED ORDER — MIDAZOLAM HCL 2 MG/2ML IJ SOLN
INTRAMUSCULAR | Status: AC
  Filled 2017-01-04: qty 2

## 2017-01-04 MED ORDER — SODIUM CHLORIDE 0.9 % IV SOLN
INTRAVENOUS | Status: DC
  Administered 2017-01-04: 07:00:00 via INTRAVENOUS

## 2017-01-04 SURGICAL SUPPLY — 10 items
CATH OMNI FLUSH 5F 65CM (CATHETERS) ×2 IMPLANT
COVER PRB 48X5XTLSCP FOLD TPE (BAG) ×1 IMPLANT
COVER PROBE 5X48 (BAG) ×1
KIT MICROINTRODUCER STIFF 5F (SHEATH) ×2 IMPLANT
KIT PV (KITS) ×2 IMPLANT
SHEATH PINNACLE 5F 10CM (SHEATH) ×2 IMPLANT
SYRINGE MEDRAD AVANTA MACH 7 (SYRINGE) ×2 IMPLANT
TRANSDUCER W/STOPCOCK (MISCELLANEOUS) ×2 IMPLANT
TRAY PV CATH (CUSTOM PROCEDURE TRAY) ×2 IMPLANT
WIRE HITORQ VERSACORE ST 145CM (WIRE) ×2 IMPLANT

## 2017-01-04 NOTE — Interval H&P Note (Signed)
History and Physical Interval Note:  01/04/2017 8:42 AM  Larry Pham  has presented today for surgery, with the diagnosis of pad  The various methods of treatment have been discussed with the patient and family. After consideration of risks, benefits and other options for treatment, the patient has consented to  Procedure(s): ABDOMINAL AORTOGRAM W/LOWER EXTREMITY (N/A) as a surgical intervention .  The patient's history has been reviewed, patient examined, no change in status, stable for surgery.  I have reviewed the patient's chart and labs.  Questions were answered to the patient's satisfaction.     Kathlyn Sacramento

## 2017-01-04 NOTE — Progress Notes (Addendum)
Site area: Left groin a 5 french arterial sheath was removed  Site Prior to Removal:  Level 0  Pressure Applied For 25 MINUTES    Bedrest Beginning at 1000am Manual:   Yes.    Patient Status During Pull:  stable  Post Pull Groin Site:  Level 0  Post Pull Instructions Given:  Yes.    Post Pull Pulses Present:  Yes.    Dressing Applied:  Yes.    Comments:  VS remain stable during sheath pull

## 2017-01-04 NOTE — H&P (View-Only) (Signed)
Cardiology Office Note   Date:  12/16/2016   ID:  Larry Pham, Larry Pham 02/19/1966, MRN [id number]  PCP:  [name], NP  Cardiologist:  Dr. [name]  Chief Complaint  Patient presents with  . other    Pt. c/o right calf pain when walking. Meds reviewed by the pt. verbally.       History of Present Illness: Larry Pham is a 51 y.o. male who presents for a follow-up visit regarding peripheral arterial disease. He has known history of coronary artery disease status post RCA PCI in the setting of inferior ST elevation myocardial infarction in 2013. He had cardiac catheterization done in December 2016 which showed moderate left main stenosis which was not significant by FFR, patent RCA stents and severe stenosis in the small distal left circumflex which was left to be treated medically.He has known history of hyperlipidemia and tobacco use.  He is known to have peripheral arterial disease with claudication. Angiography in February 2017 showed short occlusion of the mid to distal left SFA with two-vessel runoff below the knee. No significant obstructive disease affecting the right lower extremity. I performed successful self-expanding stent placement to the left SFA without complications.   He now complains of severe right calf pain with walking which is happening after about 150 yards. It forces him to stop and rest before he can resume her walking. No symptoms on the left side. He has been taking his medications regularly but unfortunately he continues to smoke.   Past Medical History:  Diagnosis Date  . CAD (coronary artery disease)   . Carpal tunnel syndrome   . Coronary atherosclerosis of artery bypass graft   . GERD (gastroesophageal reflux disease)   . History of ETOH abuse    STOPPED 9 MONTHS AGO  . Hyperlipidemia   . Hypertension   . MI (myocardial infarction) ([place]) 02-2012  . Seasonal allergies     Past Surgical History:  Procedure Laterality Date  . CARDIAC  CATHETERIZATION N/A 04/22/2015   Procedure: Left Heart Cath and Coronary Angiography;  Surgeon: Burnell Blanks, MD;  Location: [facility];  Service: Cardiovascular;  Laterality: N/A;  . CARDIAC CATHETERIZATION N/A 04/22/2015   Procedure: Intravascular Pressure Wire/FFR Study;  Surgeon: Burnell Blanks, MD;  Location: [facility];  Service: Cardiovascular;  Laterality: N/A;  . COLONOSCOPY WITH PROPOFOL N/A 03/23/2015   Procedure: COLONOSCOPY WITH PROPOFOL;  Surgeon: Daneil Dolin, MD;  Location: [facility];  Service: Endoscopy;  Laterality: N/A;  [place] time in 0849  time out  0959  total time 10 mintues  . CORONARY ANGIOPLASTY    . CORONARY ARTERY CATHETERIZATION WITH STENTING N/A 02-2102 AND 05-2012  . LEFT FOREARM SURGERY     s/p machine accident  . LOWER EXTREMITY ANGIOGRAM Bilateral 07/01/2015   Procedure: Lower Extremity Angiogram;  Surgeon: Wellington Hampshire, MD;  Location: [facility];  Service: Cardiovascular;  Laterality: Bilateral;  . PERIPHERAL VASCULAR CATHETERIZATION N/A 07/01/2015   Procedure: Abdominal Aortogram;  Surgeon: Wellington Hampshire, MD;  Location: [place] [facility];  Service: Cardiovascular;  Laterality: N/A;  . PERIPHERAL VASCULAR CATHETERIZATION Left 07/01/2015   Procedure: Peripheral Vascular Intervention;  Surgeon: Wellington Hampshire, MD;  Location: [place] [facility];  Service: Cardiovascular;  Laterality: Left;  SFA  . POLYPECTOMY N/A 03/23/2015   Procedure: POLYPECTOMY;  Surgeon: Daneil Dolin, MD;  Location: [facility];  Service: Endoscopy;  Laterality: N/A;  cecal, descending  colon  . TRIGGER FINGER RELEASE     right 5th digit     Current Outpatient Prescriptions  Medication Sig Dispense Refill  . albuterol (PROVENTIL HFA;VENTOLIN HFA) 108 (90 BASE) MCG/ACT inhaler Inhale 2 puffs into the lungs every 6 (six) hours as needed for wheezing or shortness of breath. 1 Inhaler 2  . alfuzosin (UROXATRAL) 10 MG 24 hr tablet Take 1 tablet by  mouth daily.  11  . aspirin EC 81 MG tablet Take 81 mg by mouth every morning.    Marland Kitchen atorvastatin (LIPITOR) 80 MG tablet Take 80 mg by mouth daily.    . clopidogrel (PLAVIX) 75 MG tablet Take 1 tablet (75 mg total) by mouth daily. 30 tablet 3  . gabapentin (NEURONTIN) 600 MG tablet Take 600 mg by mouth daily.     . hydrochlorothiazide (HYDRODIURIL) 25 MG tablet Take 25 mg by mouth daily.    Marland Kitchen HYDROcodone-acetaminophen (NORCO/VICODIN) 5-325 MG tablet Take one-two tabs po q 4-6 hrs prn pain 6 tablet 0  . isosorbide mononitrate (IMDUR) 30 MG 24 hr tablet Take 30 mg by mouth daily.    Marland Kitchen lisinopril (PRINIVIL,ZESTRIL) 40 MG tablet Take 40 mg by mouth daily.    . methocarbamol (ROBAXIN) 500 MG tablet Take 1 tablet (500 mg total) by mouth 3 (three) times daily. 21 tablet 0  . metoprolol succinate (TOPROL-XL) 25 MG 24 hr tablet Take 25 mg by mouth daily.  5  . nitroGLYCERIN (NITROSTAT) 0.4 MG SL tablet PLACE 1 TABLET UNDER THE TONGUE & ALLOW TO DISSOLVE EVERY 5 MIN AS NEEDED FOR CHEST PAIN 25 tablet 3  . pantoprazole (PROTONIX) 20 MG tablet Take 20 mg by mouth daily.  5  . solifenacin (VESICARE) 5 MG tablet Take 5 mg by mouth daily.    . SUMAtriptan (IMITREX) 50 MG tablet Take 1 tablet by mouth every 2 (two) hours as needed for migraine or headache.   0  . tamsulosin (FLOMAX) 0.4 MG CAPS capsule Take 0.4 mg by mouth daily.    . Vitamin D, Ergocalciferol, (DRISDOL) 50000 UNITS CAPS capsule Take 1 capsule by mouth 2 (two) times a week.  5   No current facility-administered medications for this visit.     Allergies:   Bee venom    Social History:  The patient  reports that he has been smoking Cigarettes.  He started smoking about 39 years ago. He has a 30.00 pack-year smoking history. He has never used smokeless tobacco. He reports that he drinks alcohol. He reports that he does not use drugs.   Family History:  The patient's family history includes Breast cancer in his paternal grandmother; Cirrhosis  in his father; Heart disease in his mother; Hypertension in his mother.    ROS:  Please see the history of present illness.   Otherwise, review of systems are positive for none.   All other systems are reviewed and negative.    PHYSICAL EXAM: VS:  BP 110/68 (BP Location: Left Arm, Patient Position: Sitting, Cuff Size: Normal)   Pulse 74   Ht 5\' 11"  (1.803 m)   Wt 245 lb 8 oz (111.4 kg)   BMI 34.24 kg/m  , BMI Body mass index is 34.24 kg/m. GEN: Well nourished, well developed, in no acute distress  HEENT: normal  Neck: no JVD, carotid bruits, or masses Cardiac: RRR; no murmurs, rubs, or gallops,no edema  Respiratory:  clear to auscultation bilaterally, normal work of breathing GI: soft, nontender, nondistended, + BS MS: no  deformity or atrophy  Skin: warm and dry, no rash Neuro:  Strength and sensation are intact Psych: euthymic mood, full affect Vascular: Femoral pulses are normal bilaterally with no evidence of hematoma. Posterior tibial pulses palpable on the left side and very faint on the right side     Recent Labs: 12/01/2016: ALT 35; BUN 27; Creatinine, Ser 2.02; Hemoglobin 13.2; Platelets 221; Potassium 3.6; Sodium 133    Lipid Panel No results found for: CHOL, TRIG, HDL, CHOLHDL, VLDL, LDLCALC, LDLDIRECT    Wt Readings from Last 3 Encounters:  12/16/16 245 lb 8 oz (111.4 kg)  12/01/16 245 lb (111.1 kg)  07/01/16 252 lb (114.3 kg)      Other studies Reviewed: Additional studies/ records that were reviewed today include: Noninvasive vascular studies from today. Review of the above records demonstrates: Improvement in ABI on the left side at 0.98 with patent stent on duplex.   ASSESSMENT AND PLAN:  1.  Peripheral arterial disease: The patient had previous left SFA angioplasty and stent placement with resolution of claudication. Unfortunately he is now having severe right calf claudication. No evidence of critical limb ischemia. I requested lower extremity  arterial Doppler and given severity of his symptoms, he will likely require angiography and possible endovascular intervention.  Continue dual antiplatelet therapy for now.  2. Tobacco use: He quit smoking before but unfortunately he relapsed. I discussed with him the importance of smoking cessation again.  3. Hyperlipidemia: Continue treatment with atorvastatin with a target LDL of less than 70.  4. Coronary artery disease: Currently he has no symptoms suggestive of angina. Continue medical therapy.    Disposition:   FU with me in 1 months  Signed,  Kathlyn Sacramento, MD  12/16/2016 10:56 AM    [place]

## 2017-01-04 NOTE — Discharge Instructions (Signed)
Excuse from Work, [employer], or Physical Activity ___________________________[name]____________________________ needs to be excused from: _X___ Work ____ [employer] ____ Physical activity beginning now and through the following date: ___9-5-18_____________. He or she may return to work or school but should still avoid the following physical activity or activities from now until ________________. Activity restrictions include: ____ Lifting more than _______ lb ____ Sitting longer than __________ minutes at a time ____ Standing longer than ________ minutes at a time __X__ He or she may return to full physical activity as of ___9-5-18_____________. Health Care Provider Name (printed): _____DR ARIDA___________________________________ Health Care Provider (signature): ___________________________________________ Date: 8/29/18________________ This information is not intended to replace advice given to you by your health care provider. Make sure you discuss any questions you have with your health care provider. Document Released: 10/19/2000 Document Revised: 04/08/2016 Document Reviewed: 11/25/2013 Elsevier Interactive Patient Education  2018 [employer].    Femoral Site Care Refer to this sheet in the next few weeks. These instructions provide you with information about caring for yourself after your procedure. Your health care provider may also give you more specific instructions. Your treatment has been planned according to current medical practices, but problems sometimes occur. Call your health care provider if you have any problems or questions after your procedure. What can I expect after the procedure? After your procedure, it is typical to have the following:  Bruising at the site that usually fades within 1-2 weeks.  Blood collecting in the tissue (hematoma) that may be painful to the touch. It should usually decrease in size and tenderness within 1-2 weeks.  Follow these  instructions at home:  Take medicines only as directed by your health care provider.  You may shower 24-48 hours after the procedure or as directed by your health care provider. Remove the bandage (dressing) and gently wash the site with plain soap and water. Pat the area dry with a clean towel. Do not rub the site, because this may cause bleeding.  Do not take baths, swim, or use a hot tub until your health care provider approves.  Check your insertion site every day for redness, swelling, or drainage.  Do not apply powder or lotion to the site.  Limit use of stairs to twice a day for the first 2-3 days or as directed by your health care provider.  Do not squat for the first 2-3 days or as directed by your health care provider.  Do not lift over 10 lb (4.5 kg) for 5 days after your procedure or as directed by your health care provider.  Ask your health care provider when it is okay to: ? Return to work or school. ? Resume usual physical activities or sports. ? Resume sexual activity.  Do not drive home if you are discharged the same day as the procedure. Have someone else drive you.  You may drive 24 hours after the procedure unless otherwise instructed by your health care provider.  Do not operate machinery or power tools for 24 hours after the procedure or as directed by your health care provider.  If your procedure was done as an outpatient procedure, which means that you went home the same day as your procedure, a responsible adult should be with you for the first 24 hours after you arrive home.  Keep all follow-up visits as directed by your health care provider. This is important. Contact a health care provider if:  You have a fever.  You have chills.  You have increased  bleeding from the site. Hold pressure on the site. Get help right away if:  You have unusual pain at the site.  You have redness, warmth, or swelling at the site.  You have drainage (other than a  small amount of blood on the dressing) from the site.  The site is bleeding, and the bleeding does not stop after 30 minutes of holding steady pressure on the site.  Your leg or foot becomes pale, cool, tingly, or numb. This information is not intended to replace advice given to you by your health care provider. Make sure you discuss any questions you have with your health care provider. Document Released: 12/27/2013 Document Revised: 10/01/2015 Document Reviewed: 11/12/2013 Elsevier Interactive Patient Education  [employer].

## 2017-01-05 ENCOUNTER — Encounter (HOSPITAL_COMMUNITY): Payer: Self-pay | Admitting: Cardiovascular Disease

## 2017-01-13 ENCOUNTER — Telehealth: Payer: Self-pay | Admitting: Cardiovascular Disease

## 2017-01-13 NOTE — Telephone Encounter (Signed)
Received records request [employer], forwarded to [employer] for processing.  

## 2017-01-20 ENCOUNTER — Telehealth: Payer: Self-pay | Admitting: Cardiovascular Disease

## 2017-01-20 NOTE — Telephone Encounter (Signed)
Received records request [employer], forwarded to [facility] for processing.

## 2017-01-27 ENCOUNTER — Telehealth: Payer: Self-pay | Admitting: Cardiovascular Disease

## 2017-01-27 NOTE — Telephone Encounter (Signed)
[employer] folder placed in Dr. Tyrell Antonio basket.

## 2017-01-27 NOTE — Telephone Encounter (Signed)
Received paperwork from [facility] for Dr. Fletcher Anon to review and sign. Given to Alysia Penna, RN.

## 2017-01-30 ENCOUNTER — Telehealth: Payer: Self-pay | Admitting: Cardiovascular Disease

## 2017-01-30 ENCOUNTER — Other Ambulatory Visit: Payer: Self-pay | Admitting: Cardiology

## 2017-01-30 NOTE — Telephone Encounter (Signed)
Received records request [employer], forwarded to [employer] for processing.  

## 2017-01-31 ENCOUNTER — Ambulatory Visit: Payer: BLUE CROSS/BLUE SHIELD | Admitting: Cardiovascular Disease

## 2017-01-31 NOTE — Telephone Encounter (Signed)
Sent completed paperwork to [facility]

## 2017-01-31 NOTE — Telephone Encounter (Signed)
[employer] folder given to [employer]

## 2017-02-13 ENCOUNTER — Ambulatory Visit (INDEPENDENT_AMBULATORY_CARE_PROVIDER_SITE_OTHER): Payer: BLUE CROSS/BLUE SHIELD | Admitting: Cardiovascular Disease

## 2017-02-13 ENCOUNTER — Encounter: Payer: Self-pay | Admitting: Cardiovascular Disease

## 2017-02-13 VITALS — BP 157/90 | HR 67 | Ht 71.0 in | Wt 246.5 lb

## 2017-02-13 DIAGNOSIS — I2510 Atherosclerotic heart disease of native coronary artery without angina pectoris: Secondary | ICD-10-CM | POA: Diagnosis not present

## 2017-02-13 DIAGNOSIS — I739 Peripheral vascular disease, unspecified: Secondary | ICD-10-CM

## 2017-02-13 DIAGNOSIS — Z720 Tobacco use: Secondary | ICD-10-CM | POA: Diagnosis not present

## 2017-02-13 DIAGNOSIS — E785 Hyperlipidemia, unspecified: Secondary | ICD-10-CM | POA: Diagnosis not present

## 2017-02-13 MED ORDER — CILOSTAZOL 50 MG PO TABS: 50.0000 mg | ORAL_TABLET | Freq: Two times a day (BID) | ORAL | 5 refills | Status: DC

## 2017-02-13 NOTE — Progress Notes (Signed)
Cardiology Office Note   Date:  02/13/2017   ID:  Larry Pham, DOB 02/13/1966, MRN [id number]  PCP:  [name], NP  Cardiologist:  Dr. [name]  Chief Complaint  Patient presents with  . other    3 week follow up. Patient c/o leg cramps when walking. Meds reviewed verbally with patient.       History of Present Illness: Larry Pham is a 51 y.o. male who presents for a follow-up visit regarding peripheral arterial disease. He has known history of coronary artery disease status post RCA PCI in the setting of inferior ST elevation myocardial infarction in 2013. He had cardiac catheterization done in December 2016 which showed moderate left main stenosis which was not significant by FFR, patent RCA stents and severe stenosis in the small distal left circumflex which was left to be treated medically.He has known history of hyperlipidemia and tobacco use.  He is known to have peripheral arterial disease with claudication. Angiography in February 2017 showed short occlusion of the mid to distal left SFA with two-vessel runoff below the knee. No significant obstructive disease affecting the right lower extremity. I performed successful self-expanding stent placement to the left SFA without complications.   He was seen recently for severe right calf claudication. I proceeded with angiography which showed focal moderate 40-50% stenosis in the right mid SFA. The left SFA stent was patent with mild in-stent restenosis. No revascularization was needed. He continues to complain of right calf claudication when he walks a long distance. This does not happen while he is working but he notices the symptoms when he is walking to his car after work given the long distance. Unfortunately, he continues to smoke.   Past Medical History:  Diagnosis Date  . CAD (coronary artery disease)   . Carpal tunnel syndrome   . Coronary atherosclerosis of artery bypass graft   . GERD (gastroesophageal  reflux disease)   . History of ETOH abuse    STOPPED 9 MONTHS AGO  . Hyperlipidemia   . Hypertension   . MI (myocardial infarction) ([place]) 02-2012  . Seasonal allergies     Past Surgical History:  Procedure Laterality Date  . ABDOMINAL AORTOGRAM W/LOWER EXTREMITY N/A 01/04/2017   Procedure: ABDOMINAL AORTOGRAM W/LOWER EXTREMITY;  Surgeon: Wellington Hampshire, MD;  Location: [place] [facility];  Service: Cardiovascular;  Laterality: N/A;  . CARDIAC CATHETERIZATION N/A 04/22/2015   Procedure: Left Heart Cath and Coronary Angiography;  Surgeon: Burnell Blanks, MD;  Location: [place] [facility];  Service: Cardiovascular;  Laterality: N/A;  . CARDIAC CATHETERIZATION N/A 04/22/2015   Procedure: Intravascular Pressure Wire/FFR Study;  Surgeon: Burnell Blanks, MD;  Location: [place] [facility];  Service: Cardiovascular;  Laterality: N/A;  . COLONOSCOPY WITH PROPOFOL N/A 03/23/2015   Procedure: COLONOSCOPY WITH PROPOFOL;  Surgeon: Daneil Dolin, MD;  Location: [facility];  Service: Endoscopy;  Laterality: N/A;  [place] time in 0849  time out  0959  total time 10 mintues  . CORONARY ANGIOPLASTY    . CORONARY ARTERY CATHETERIZATION WITH STENTING N/A 02-2102 AND 05-2012  . LEFT FOREARM SURGERY     s/p machine accident  . LOWER EXTREMITY ANGIOGRAM Bilateral 07/01/2015   Procedure: Lower Extremity Angiogram;  Surgeon: Wellington Hampshire, MD;  Location: [place] [facility];  Service: Cardiovascular;  Laterality: Bilateral;  . PERIPHERAL VASCULAR CATHETERIZATION N/A 07/01/2015   Procedure: Abdominal Aortogram;  Surgeon: Wellington Hampshire, MD;  Location: [place]  [facility];  Service: Cardiovascular;  Laterality: N/A;  . PERIPHERAL VASCULAR CATHETERIZATION Left 07/01/2015   Procedure: Peripheral Vascular Intervention;  Surgeon: Wellington Hampshire, MD;  Location: [place] [facility];  Service: Cardiovascular;  Laterality: Left;  SFA  . POLYPECTOMY N/A 03/23/2015   Procedure: POLYPECTOMY;  Surgeon: Daneil Dolin, MD;  Location: [facility];  Service: Endoscopy;  Laterality: N/A;  cecal, descending colon  . TRIGGER FINGER RELEASE     right 5th digit     Current Outpatient Prescriptions  Medication Sig Dispense Refill  . albuterol (PROVENTIL HFA;VENTOLIN HFA) 108 (90 BASE) MCG/ACT inhaler Inhale 2 puffs into the lungs every 6 (six) hours as needed for wheezing or shortness of breath. 1 Inhaler 2  . alfuzosin (UROXATRAL) 10 MG 24 hr tablet Take 10 mg by mouth daily.   11  . aspirin EC 81 MG tablet Take 81 mg by mouth daily.    Marland Kitchen atorvastatin (LIPITOR) 80 MG tablet Take 80 mg by mouth daily.    Marland Kitchen gabapentin (NEURONTIN) 600 MG tablet Take 600 mg by mouth 3 (three) times daily.     . hydrochlorothiazide (HYDRODIURIL) 25 MG tablet Take 25 mg by mouth daily.    Marland Kitchen lisinopril (PRINIVIL,ZESTRIL) 40 MG tablet Take 40 mg by mouth daily.    . metoprolol succinate (TOPROL-XL) 25 MG 24 hr tablet Take 25 mg by mouth daily.  5  . naproxen sodium (ANAPROX) 220 MG tablet Take 440 mg by mouth 2 (two) times daily as needed (for pain.).    Marland Kitchen nitroGLYCERIN (NITROSTAT) 0.4 MG SL tablet PLACE 1 TABLET UNDER THE TONGUE & ALLOW TO DISSOLVE EVER 5 MIN AS NEEDED FOR CHEST PAIN 25 tablet 1  . pantoprazole (PROTONIX) 20 MG tablet Take 20 mg by mouth daily.  5  . SUMAtriptan (IMITREX) 50 MG tablet Take 1 tablet by mouth every 2 (two) hours as needed for migraine or headache.   0  . tiZANidine (ZANAFLEX) 4 MG tablet Take 4 mg by mouth daily.    . Vitamin D, Ergocalciferol, (DRISDOL) 50000 UNITS CAPS capsule Take 1 capsule by mouth 2 (two) times a week.  5   No current facility-administered medications for this visit.     Allergies:   Bee venom    Social History:  The patient  reports that he has been smoking Cigarettes.  He started smoking about 39 years ago. He has a 7.50 pack-year smoking history. He has never used smokeless tobacco. He reports that he drinks alcohol. He reports that he does not use drugs.   Family History:   The patient's family history includes Breast cancer in his paternal grandmother; Cirrhosis in his father; Heart disease in his mother; Hypertension in his mother.    ROS:  Please see the history of present illness.   Otherwise, review of systems are positive for none.   All other systems are reviewed and negative.    PHYSICAL EXAM: VS:  BP (!) 157/90 (BP Location: Left Arm, Patient Position: Sitting, Cuff Size: Normal)   Pulse 67   Ht 5\' 11"  (1.803 m)   Wt 246 lb 8 oz (111.8 kg)   BMI 34.38 kg/m  , BMI Body mass index is 34.38 kg/m. GEN: Well nourished, well developed, in no acute distress  HEENT: normal  Neck: no JVD, carotid bruits, or masses Cardiac: RRR; no murmurs, rubs, or gallops,no edema  Respiratory:  clear to auscultation bilaterally, normal work of breathing GI: soft, nontender, nondistended, + BS  MS: no deformity or atrophy  Skin: warm and dry, no rash Neuro:  Strength and sensation are intact Psych: euthymic mood, full affect   Recent Labs: 12/01/2016: ALT 35 01/02/2017: BUN 21; Creatinine, Ser 1.03; Hemoglobin 14.8; Platelets 252; Potassium 4.3; Sodium 134    Lipid Panel No results found for: CHOL, TRIG, HDL, CHOLHDL, VLDL, LDLCALC, LDLDIRECT    Wt Readings from Last 3 Encounters:  02/13/17 246 lb 8 oz (111.8 kg)  01/04/17 245 lb (111.1 kg)  12/16/16 245 lb 8 oz (111.4 kg)      Other studies Reviewed: Additional studies/ records that were reviewed today include: Noninvasive vascular studies from today. Review of the above records demonstrates: Improvement in ABI on the left side at 0.98 with patent stent on duplex.   ASSESSMENT AND PLAN:  1.  Peripheral arterial disease: The patient had previous left SFA angioplasty and stent placement with resolution of claudication. He continues to have right calf claudication. Angiography showed only moderate right SFA stenosis. I'm going to add cilostazol and I discussed with him the importance of continued activities  and walking.  2. Tobacco use: He quit smoking before but unfortunately he relapsed. I  again discussed with him the importance of smoking cessation and he might consider Chantix.   3. Hyperlipidemia: Continue treatment with atorvastatin with a target LDL of less than 70.  4. Coronary artery disease: Currently he has no symptoms suggestive of angina. Continue medical therapy.    Disposition:   FU with me in 6 months  Signed,  Kathlyn Sacramento, MD  02/13/2017 2:34 PM    [place]

## 2017-02-13 NOTE — Patient Instructions (Addendum)
Medication Instructions:  Your physician has recommended you make the following change in your medication:  START taking pletal 50mg  twice daily   Labwork: none  Testing/Procedures: none  Follow-Up: Your physician wants you to follow-up in: 6 months with Dr. Fletcher Anon.  You will receive a reminder letter in the mail two months in advance. If you don't receive a letter, please call our office to schedule the follow-up appointment.   Any Other Special Instructions Will Be Listed Below (If Applicable).     If you need a refill on your cardiac medications before your next appointment, please call your pharmacy.   Coping with Quitting Smoking Quitting smoking is a physical and mental challenge. You will face cravings, withdrawal symptoms, and temptation. Before quitting, work with your health care provider to make a plan that can help you cope. Preparation can help you quit and keep you from giving in. How can I cope with cravings? Cravings usually last for 5-10 minutes. If you get through it, the craving will pass. Consider taking the following actions to help you cope with cravings:  Keep your mouth busy: ? Chew sugar-free gum. ? Suck on hard candies or a straw. ? Brush your teeth.  Keep your hands and body busy: ? Immediately change to a different activity when you feel a craving. ? Squeeze or play with a ball. ? Do an activity or a hobby, like making bead jewelry, practicing needlepoint, or working with wood. ? Mix up your normal routine. ? Take a short exercise break. Go for a quick walk or run up and down stairs. ? Spend time in public places where smoking is not allowed.  Focus on doing something kind or helpful for someone else.  Call a friend or family member to talk during a craving.  Join a support group.  Call a quit line, such as 1-800-QUIT-NOW.  Talk with your health care provider about medicines that might help you cope with cravings and make quitting easier for  you.  How can I deal with withdrawal symptoms? Your body may experience negative effects as it tries to get used to not having nicotine in the system. These effects are called withdrawal symptoms. They may include:  Feeling hungrier than normal.  Trouble concentrating.  Irritability.  Trouble sleeping.  Feeling depressed.  Restlessness and agitation.  Craving a cigarette.  To manage withdrawal symptoms:  Avoid places, people, and activities that trigger your cravings.  Remember why you want to quit.  Get plenty of sleep.  Avoid coffee and other caffeinated drinks. These may worsen some of your symptoms.  How can I handle social situations? Social situations can be difficult when you are quitting smoking, especially in the first few weeks. To manage this, you can:  Avoid parties, bars, and other social situations where people might be smoking.  Avoid alcohol.  Leave right away if you have the urge to smoke.  Explain to your family and friends that you are quitting smoking. Ask for understanding and support.  Plan activities with friends or family where smoking is not an option.  What are some ways I can cope with stress? Wanting to smoke may cause stress, and stress can make you want to smoke. Find ways to manage your stress. Relaxation techniques can help. For example:  Breathe slowly and deeply, in through your nose and out through your mouth.  Listen to soothing, relaxing music.  Talk with a family member or friend about your stress.  Light a  candle.  Soak in a bath or take a shower.  Think about a peaceful place.  What are some ways I can prevent weight gain? Be aware that many people gain weight after they quit smoking. However, not everyone does. To keep from gaining weight, have a plan in place before you quit and stick to the plan after you quit. Your plan should include:  Having healthy snacks. When you have a craving, it may help to: ? Eat plain  popcorn, crunchy carrots, celery, or other cut vegetables. ? Chew sugar-free gum.  Changing how you eat: ? Eat small portion sizes at meals. ? Eat 4-6 small meals throughout the day instead of 1-2 large meals a day. ? Be mindful when you eat. Do not watch television or do other things that might distract you as you eat.  Exercising regularly: ? Make time to exercise each day. If you do not have time for a long workout, do short bouts of exercise for 5-10 minutes several times a day. ? Do some form of strengthening exercise, like weight lifting, and some form of aerobic exercise, like running or swimming.  Drinking plenty of water or other low-calorie or no-calorie drinks. Drink 6-8 glasses of water daily, or as much as instructed by your health care provider.  Summary  Quitting smoking is a physical and mental challenge. You will face cravings, withdrawal symptoms, and temptation to smoke again. Preparation can help you as you go through these challenges.  You can cope with cravings by keeping your mouth busy (such as by chewing gum), keeping your body and hands busy, and making calls to family, friends, or a helpline for people who want to quit smoking.  You can cope with withdrawal symptoms by avoiding places where people smoke, avoiding drinks with caffeine, and getting plenty of rest.  Ask your health care provider about the different ways to prevent weight gain, avoid stress, and handle social situations. This information is not intended to replace advice given to you by your health care provider. Make sure you discuss any questions you have with your health care provider. Document Released: 04/22/2016 Document Revised: 04/22/2016 Document Reviewed: 04/22/2016 Elsevier Interactive Patient Education  2018 [place] Risks of Smoking Smoking cigarettes is very bad for your health. Tobacco smoke has over 200 known poisons in it. It contains the poisonous gases nitrogen oxide  and carbon monoxide. There are over 60 chemicals in tobacco smoke that cause cancer. Smoking is difficult to quit because a chemical in tobacco, called nicotine, causes addiction or dependence. When you smoke and inhale, nicotine is absorbed rapidly into the bloodstream through your lungs. Both inhaled and non-inhaled nicotine may be addictive. What are the risks of cigarette smoke? Cigarette smokers have an increased risk of many serious medical problems, including:  Lung cancer.  Lung disease, such as pneumonia, bronchitis, and emphysema.  Chest pain (angina) and heart attack because the heart is not getting enough oxygen.  Heart disease and peripheral blood vessel disease.  High blood pressure (hypertension).  Stroke.  Oral cancer, including cancer of the lip, mouth, or voice box.  Bladder cancer.  Pancreatic cancer.  Cervical cancer.  Pregnancy complications, including premature birth.  Stillbirths and smaller newborn babies, birth defects, and genetic damage to sperm.  Early menopause.  Lower estrogen level for women.  Infertility.  Facial wrinkles.  Blindness.  Increased risk of broken bones (fractures).  Senile dementia.  Stomach ulcers and internal bleeding.  Delayed wound healing  and increased risk of complications during surgery.  Even smoking lightly shortens your life expectancy by several years.  Because of secondhand smoke exposure, children of smokers have an increased risk of the following:  Sudden infant death syndrome (SIDS).  Respiratory infections.  Lung cancer.  Heart disease.  Ear infections.  What are the benefits of quitting? There are many health benefits of quitting smoking. Here are some of them:  Within days of quitting smoking, your risk of having a heart attack decreases, your blood flow improves, and your lung capacity improves. Blood pressure, pulse rate, and breathing patterns start returning to normal soon after  quitting.  Within months, your lungs may clear up completely.  Quitting for 10 years reduces your risk of developing lung cancer and heart disease to almost that of a nonsmoker.  People who quit may see an improvement in their overall quality of life.  How do I quit smoking? Smoking is an addiction with both physical and psychological effects, and longtime habits can be hard to change. Your health care provider can recommend:  Programs and community resources, which may include group support, education, or talk therapy.  Prescription medicines to help reduce cravings.  Nicotine replacement products, such as patches, gum, and nasal sprays. Use these products only as directed. Do not replace cigarette smoking with electronic cigarettes, which are commonly called e-cigarettes. The safety of e-cigarettes is not known, and some may contain harmful chemicals.  A combination of two or more of these methods.  Where to find more information:  American Lung Association: www.lung.org  American Cancer Society: www.cancer.org Summary  Smoking cigarettes is very bad for your health. Cigarette smokers have an increased risk of many serious medical problems, including several cancers, heart disease, and stroke.  Smoking is an addiction with both physical and psychological effects, and longtime habits can be hard to change.  By stopping right away, you can greatly reduce the risk of medical problems for you and your family.  To help you quit smoking, your health care provider can recommend programs, community resources, prescription medicines, and nicotine replacement products such as patches, gum, and nasal sprays. This information is not intended to replace advice given to you by your health care provider. Make sure you discuss any questions you have with your health care provider. Document Released: 06/02/2004 Document Revised: 04/29/2016 Document Reviewed: 04/29/2016 Elsevier Interactive  Patient Education  2017 [employer]  Smoking Tobacco Information Smoking tobacco will very likely harm your health. Tobacco contains a poisonous (toxic), colorless chemical called nicotine. Nicotine affects the brain and makes tobacco addictive. This change in your brain can make it hard to stop smoking. Tobacco also has other toxic chemicals that can hurt your body and raise your risk of many cancers. How can smoking tobacco affect me? Smoking tobacco can increase your chances of having serious health conditions, such as:  Cancer. Smoking is most commonly associated with lung cancer, but can lead to cancer in other parts of the body.  Chronic obstructive pulmonary disease (COPD). This is a long-term lung condition that makes it hard to breathe. It also gets worse over time.  High blood pressure (hypertension), heart disease, stroke, or heart attack.  Lung infections, such as pneumonia.  Cataracts. This is when the lenses in the eyes become clouded.  Digestive problems. This may include peptic ulcers, heartburn, and gastroesophageal reflux disease (GERD).  Oral health problems, such as gum disease and tooth loss.  Loss of taste and smell.  Smoking  can affect your appearance by causing:  Wrinkles.  Yellow or stained teeth, fingers, and fingernails.  Smoking tobacco can also affect your social life.  Many workplaces, [employer], hotels, and public places are tobacco-free. This means that you may experience challenges in finding places to smoke when away from home.  The cost of a smoking habit can be expensive. Expenses for someone who smokes come in two ways: ? You spend money on a regular basis to buy tobacco. ? Your health care costs in the long-term are higher if you smoke.  Tobacco smoke can also affect the health of those around you. Children of smokers have greater chances of: ? Sudden infant death syndrome (SIDS). ? Ear infections. ? Lung infections.  What lifestyle  changes can be made?  Do not start smoking. Quit if you already do.  To quit smoking: ? Make a plan to quit smoking and commit yourself to it. Look for programs to help you and ask your health care provider for recommendations and ideas. ? Talk with your health care provider about using nicotine replacement medicines to help you quit. Medicine replacement medicines include gum, lozenges, patches, sprays, or pills. ? Do not replace cigarette smoking with electronic cigarettes, which are commonly called e-cigarettes. The safety of e-cigarettes is not known, and some may contain harmful chemicals. ? Avoid places, people, or situations that tempt you to smoke. ? If you try to quit but return to smoking, don't give up hope. It is very common for people to try a number of times before they fully succeed. When you feel ready again, give it another try.  Quitting smoking might affect the way you eat as well as your weight. Be prepared to monitor your eating habits. Get support in planning and following a healthy diet.  Ask your health care provider about having regular tests (screenings) to check for cancer. This may include blood tests, imaging tests, and other tests.  Exercise regularly. Consider taking walks, joining a gym, or doing yoga or exercise classes.  Develop skills to manage your stress. These skills include meditation. What are the benefits of quitting smoking? By quitting smoking, you may:  Lower your risk of getting cancer and other diseases caused by smoking.  Live longer.  Breathe better.  Lower your blood pressure and heart rate.  Stop your addiction to tobacco.  Stop creating secondhand smoke that hurts other people.  Improve your sense of taste and smell.  Look better over time, due to having fewer wrinkles and less staining.  What can happen if changes are not made? If you do not stop smoking, you may:  Get cancer and other diseases.  Develop COPD or other  long-term (chronic) lung conditions.  Develop serious problems with your heart and blood vessels (cardiovascular system).  Need more tests to screen for problems caused by smoking.  Have higher, long-term healthcare costs from medicines or treatments related to smoking.  Continue to have worsening changes in your lungs, mouth, and nose.  Where to find support: To get support to quit smoking, consider:  Asking your health care provider for more information and resources.  Taking classes to learn more about quitting smoking.  Looking for local organizations that offer resources about quitting smoking.  Joining a support group for people who want to quit smoking in your local community.  Where to find more information: You may find more information about quitting smoking from:  HelpGuide.org: www.helpguide.org/articles/addictions/how-to-quit-smoking.htm  https://hall.com/: smokefree.gov  American Lung Association: www.lung.org  Contact a health care provider if:  You have problems breathing.  Your lips, nose, or fingers turn blue.  You have chest pain.  You are coughing up blood.  You feel faint or you pass out.  You have other noticeable changes that cause you to worry. Summary  Smoking tobacco can negatively affect your health, the health of those around you, your finances, and your social life.  Do not start smoking. Quit if you already do. If you need help quitting, ask your health care provider.  Think about joining a support group for people who want to quit smoking in your local community. There are many effective programs that will help you to quit this behavior. This information is not intended to replace advice given to you by your health care provider. Make sure you discuss any questions you have with your health care provider. Document Released: 05/10/2016 Document Revised: 05/10/2016 Document Reviewed: 05/10/2016 Elsevier Interactive Patient Education  2018  [employer].  Steps to Quit Smoking Smoking tobacco can be bad for your health. It can also affect almost every organ in your body. Smoking puts you and people around you at risk for many serious long-lasting (chronic) diseases. Quitting smoking is hard, but it is one of the best things that you can do for your health. It is never too late to quit. What are the benefits of quitting smoking? When you quit smoking, you lower your risk for getting serious diseases and conditions. They can include:  Lung cancer or lung disease.  Heart disease.  Stroke.  Heart attack.  Not being able to have children (infertility).  Weak bones (osteoporosis) and broken bones (fractures).  If you have coughing, wheezing, and shortness of breath, those symptoms may get better when you quit. You may also get sick less often. If you are pregnant, quitting smoking can help to lower your chances of having a baby of low birth weight. What can I do to help me quit smoking? Talk with your doctor about what can help you quit smoking. Some things you can do (strategies) include:  Quitting smoking totally, instead of slowly cutting back how much you smoke over a period of time.  Going to in-person counseling. You are more likely to quit if you go to many counseling sessions.  Using resources and support systems, such as: ? Database administrator with a Social worker. ? Phone quitlines. ? Careers information officer. ? Support groups or group counseling. ? Text messaging programs. ? Mobile phone apps or applications.  Taking medicines. Some of these medicines may have nicotine in them. If you are pregnant or breastfeeding, do not take any medicines to quit smoking unless your doctor says it is okay. Talk with your doctor about counseling or other things that can help you.  Talk with your doctor about using more than one strategy at the same time, such as taking medicines while you are also going to in-person counseling. This  can help make quitting easier. What things can I do to make it easier to quit? Quitting smoking might feel very hard at first, but there is a lot that you can do to make it easier. Take these steps:  Talk to your family and friends. Ask them to support and encourage you.  Call phone quitlines, reach out to support groups, or work with a Social worker.  Ask people who smoke to not smoke around you.  Avoid places that make you want (trigger) to smoke, such as: ? Bars. ? Parties. ?  Smoke-break areas at work.  Spend time with people who do not smoke.  Lower the stress in your life. Stress can make you want to smoke. Try these things to help your stress: ? Getting regular exercise. ? Deep-breathing exercises. ? Yoga. ? Meditating. ? Doing a body scan. To do this, close your eyes, focus on one area of your body at a time from head to toe, and notice which parts of your body are tense. Try to relax the muscles in those areas.  Download or buy apps on your mobile phone or tablet that can help you stick to your quit plan. There are many free apps, such as QuitGuide from the [employer] Office manager for Disease Control and Prevention). You can find more support from smokefree.gov and other websites.  This information is not intended to replace advice given to you by your health care provider. Make sure you discuss any questions you have with your health care provider. Document Released: 02/19/2009 Document Revised: 12/22/2015 Document Reviewed: 09/09/2014 Elsevier Interactive Patient Education  2018 [employer].

## 2017-02-17 ENCOUNTER — Telehealth: Payer: Self-pay | Admitting: Cardiovascular Disease

## 2017-02-17 NOTE — Telephone Encounter (Signed)
Clearance for dental procedure from [facility] placed in MD basket

## 2017-02-21 NOTE — Telephone Encounter (Signed)
Routed clearance for dental cleaning to [facility], [phone]

## 2017-06-03 ENCOUNTER — Emergency Department (HOSPITAL_COMMUNITY): Payer: BLUE CROSS/BLUE SHIELD

## 2017-06-03 ENCOUNTER — Emergency Department (HOSPITAL_COMMUNITY)
Admission: EM | Admit: 2017-06-03 | Discharge: 2017-06-03 | Disposition: A | Discharge status: Home / Self Care | Payer: BLUE CROSS/BLUE SHIELD | Attending: Emergency Medicine | Admitting: Emergency Medicine

## 2017-06-03 ENCOUNTER — Other Ambulatory Visit: Payer: Self-pay

## 2017-06-03 ENCOUNTER — Encounter (HOSPITAL_COMMUNITY): Payer: Self-pay | Admitting: *Deleted

## 2017-06-03 DIAGNOSIS — I2510 Atherosclerotic heart disease of native coronary artery without angina pectoris: Secondary | ICD-10-CM | POA: Diagnosis not present

## 2017-06-03 DIAGNOSIS — N182 Chronic kidney disease, stage 2 (mild): Secondary | ICD-10-CM | POA: Insufficient documentation

## 2017-06-03 DIAGNOSIS — Z7982 Long term (current) use of aspirin: Secondary | ICD-10-CM | POA: Insufficient documentation

## 2017-06-03 DIAGNOSIS — I129 Hypertensive chronic kidney disease with stage 1 through stage 4 chronic kidney disease, or unspecified chronic kidney disease: Secondary | ICD-10-CM | POA: Insufficient documentation

## 2017-06-03 DIAGNOSIS — K2900 Acute gastritis without bleeding: Secondary | ICD-10-CM | POA: Insufficient documentation

## 2017-06-03 DIAGNOSIS — Z79899 Other long term (current) drug therapy: Secondary | ICD-10-CM | POA: Insufficient documentation

## 2017-06-03 DIAGNOSIS — Z951 Presence of aortocoronary bypass graft: Secondary | ICD-10-CM | POA: Insufficient documentation

## 2017-06-03 DIAGNOSIS — R1010 Upper abdominal pain, unspecified: Secondary | ICD-10-CM | POA: Diagnosis present

## 2017-06-03 DIAGNOSIS — F17210 Nicotine dependence, cigarettes, uncomplicated: Secondary | ICD-10-CM | POA: Diagnosis not present

## 2017-06-03 LAB — CBC WITH DIFFERENTIAL/PLATELET
BASOS ABS: 0 10*3/uL (ref 0.0–0.1)
BASOS PCT: 0 %
EOS PCT: 1 %
Eosinophils Absolute: 0.1 10*3/uL (ref 0.0–0.7)
HCT: 44.8 % (ref 39.0–52.0)
Hemoglobin: 14.8 g/dL (ref 13.0–17.0)
Lymphocytes Relative: 24 %
Lymphs Abs: 2.3 10*3/uL (ref 0.7–4.0)
MCH: 31 pg (ref 26.0–34.0)
MCHC: 33 g/dL (ref 30.0–36.0)
MCV: 93.7 fL (ref 78.0–100.0)
MONOS PCT: 9 %
Monocytes Absolute: 0.9 10*3/uL (ref 0.1–1.0)
Neutro Abs: 6.4 10*3/uL (ref 1.7–7.7)
Neutrophils Relative %: 66 %
PLATELETS: 257 10*3/uL (ref 150–400)
RBC: 4.78 MIL/uL (ref 4.22–5.81)
RDW: 13.4 % (ref 11.5–15.5)
WBC: 9.7 10*3/uL (ref 4.0–10.5)

## 2017-06-03 LAB — COMPREHENSIVE METABOLIC PANEL
ALBUMIN: 4.4 g/dL (ref 3.5–5.0)
ALT: 29 U/L (ref 17–63)
ANION GAP: 12 (ref 5–15)
AST: 28 U/L (ref 15–41)
Alkaline Phosphatase: 103 U/L (ref 38–126)
BILIRUBIN TOTAL: 0.4 mg/dL (ref 0.3–1.2)
BUN: 22 mg/dL — ABNORMAL HIGH (ref 6–20)
CHLORIDE: 104 mmol/L (ref 101–111)
CO2: 23 mmol/L (ref 22–32)
Calcium: 9.8 mg/dL (ref 8.9–10.3)
Creatinine, Ser: 1.37 mg/dL — ABNORMAL HIGH (ref 0.61–1.24)
GFR calc Af Amer: >60 mL/min (ref >60)
GFR, EST NON AFRICAN AMERICAN: 57 mL/min — AB (ref >60)
Glucose, Bld: 133 mg/dL — ABNORMAL HIGH (ref 65–99)
POTASSIUM: 3.8 mmol/L (ref 3.5–5.1)
Sodium: 139 mmol/L (ref 135–145)
TOTAL PROTEIN: 8 g/dL (ref 6.5–8.1)

## 2017-06-03 LAB — I-STAT TROPONIN, ED
TROPONIN I, POC: 0 ng/mL (ref 0.00–0.08)
TROPONIN I, POC: 0 ng/mL (ref 0.00–0.08)

## 2017-06-03 LAB — I-STAT CG4 LACTIC ACID, ED: Lactic Acid, Venous: 1.84 mmol/L (ref 0.5–1.9)

## 2017-06-03 LAB — LIPASE, BLOOD: LIPASE: 32 U/L (ref 11–51)

## 2017-06-03 MED ORDER — RANITIDINE HCL 150 MG PO TABS: 150.0000 mg | ORAL_TABLET | Freq: Two times a day (BID) | ORAL | 0 refills | Status: DC

## 2017-06-03 MED ORDER — SUCRALFATE 1 GM/10ML PO SUSP: 1.0000 g | Freq: Three times a day (TID) | ORAL | 0 refills | Status: DC

## 2017-06-03 MED ORDER — SODIUM CHLORIDE 0.9 % IV BOLUS (SEPSIS)
500.0000 mL | Freq: Once | INTRAVENOUS | Status: AC
  Administered 2017-06-03: 500 mL via INTRAVENOUS

## 2017-06-03 MED ORDER — IOPAMIDOL (ISOVUE-370) INJECTION 76%
100.0000 mL | Freq: Once | INTRAVENOUS | Status: AC | PRN
  Administered 2017-06-03: 100 mL via INTRAVENOUS

## 2017-06-03 NOTE — ED Notes (Signed)
Ct was called and are coming to get this pt.

## 2017-06-03 NOTE — ED Provider Notes (Signed)
[facility] EMERGENCY DEPARTMENT Provider Note   CSN: [id number] Arrival date & time: 06/03/17  4132     History   Chief Complaint Chief Complaint  Patient presents with  . Abdominal Pain    HPI Larry Pham is a 52 y.o. male.  Patient presents to the emergency department for evaluation of abdominal pain.  Symptoms ongoing for 2 or 3 days.  Patient reports that he initially felt constipated.  He took a laxative and had a minor amount of improvement, but symptoms came back.  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  It started as upper central abdomen pain, now is going down both sides anteriorly and into the back.  He has not had any associated fever, nausea, vomiting, shortness of breath.  He has noticed that the pain worsens when he moves around, such as trying to sit up or change positions in bed.  This is caused him to have trouble sleeping.      Past Medical History:  Diagnosis Date  . CAD (coronary artery disease)   . Carpal tunnel syndrome   . Coronary atherosclerosis of artery bypass graft   . GERD (gastroesophageal reflux disease)   . History of ETOH abuse    STOPPED 9 MONTHS AGO  . Hyperlipidemia   . Hypertension   . MI (myocardial infarction) ([place]) 02-2012  . Seasonal allergies     Patient Active Problem List   Diagnosis Date Noted  . CKD (chronic kidney disease), stage II 02/18/2016  . ETOH abuse 02/18/2016  . Noncompliance 02/18/2016  . Chest pain on exertion 02/17/2016  . PAD (peripheral artery disease) ([place]) 06/23/2015  . Coronary artery disease involving native coronary artery of native heart with angina pectoris ([place])   . Diverticulosis of colon without hemorrhage   . History of colonic polyps   . Screening for colon cancer 03/09/2015  . CAD (coronary artery disease), native coronary artery 06/19/2012  . 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 Tobacco use 06/19/2012  .  History of alcohol abuse 06/19/2012  . Hyperlipidemia with target LDL less than 70 06/19/2012  . Essential hypertension 06/19/2012    Past Surgical History:  Procedure Laterality Date  . ABDOMINAL AORTOGRAM W/LOWER EXTREMITY N/A 01/04/2017   Procedure: ABDOMINAL AORTOGRAM W/LOWER EXTREMITY;  Surgeon: Wellington Hampshire, MD;  Location: [place] [facility];  Service: Cardiovascular;  Laterality: N/A;  . CARDIAC CATHETERIZATION N/A 04/22/2015   Procedure: Left Heart Cath and Coronary Angiography;  Surgeon: Burnell Blanks, MD;  Location: [place] [facility];  Service: Cardiovascular;  Laterality: N/A;  . CARDIAC CATHETERIZATION N/A 04/22/2015   Procedure: Intravascular Pressure Wire/FFR Study;  Surgeon: Burnell Blanks, MD;  Location: [place] [facility];  Service: Cardiovascular;  Laterality: N/A;  . COLONOSCOPY WITH PROPOFOL N/A 03/23/2015   Procedure: COLONOSCOPY WITH PROPOFOL;  Surgeon: Daneil Dolin, MD;  Location: [facility];  Service: Endoscopy;  Laterality: N/A;  [place] time in 0849  time out  0959  total time 10 mintues  . CORONARY ANGIOPLASTY    . CORONARY ARTERY CATHETERIZATION WITH STENTING N/A 02-2102 AND 05-2012  . LEFT FOREARM SURGERY     s/p machine accident  . LOWER EXTREMITY ANGIOGRAM Bilateral 07/01/2015   Procedure: Lower Extremity Angiogram;  Surgeon: Wellington Hampshire, MD;  Location: [place] [facility];  Service: Cardiovascular;  Laterality: Bilateral;  . PERIPHERAL VASCULAR CATHETERIZATION N/A 07/01/2015  Procedure: Abdominal Aortogram;  Surgeon: Wellington Hampshire, MD;  Location: [place] [facility];  Service: Cardiovascular;  Laterality: N/A;  . PERIPHERAL VASCULAR CATHETERIZATION Left 07/01/2015   Procedure: Peripheral Vascular Intervention;  Surgeon: Wellington Hampshire, MD;  Location: [place] [facility];  Service: Cardiovascular;  Laterality: Left;  SFA  . POLYPECTOMY N/A 03/23/2015   Procedure: POLYPECTOMY;  Surgeon: Daneil Dolin, MD;  Location: [facility];  Service:  Endoscopy;  Laterality: N/A;  cecal, descending colon  . TRIGGER FINGER RELEASE     right 5th digit       Home Medications    Prior to Admission medications   Medication Sig Start Date End Date Taking? Authorizing Provider  albuterol (PROVENTIL HFA;VENTOLIN HFA) 108 (90 BASE) MCG/ACT inhaler Inhale 2 puffs into the lungs every 6 (six) hours as needed for wheezing or shortness of breath. 04/13/15  Yes BranchAlphonse Guild, MD  cilostazol (PLETAL) 50 MG tablet Take 1 tablet (50 mg total) by mouth 2 (two) times daily. 02/13/17  Yes Wellington Hampshire, MD  nitroGLYCERIN (NITROSTAT) 0.4 MG SL tablet PLACE 1 TABLET UNDER THE TONGUE & ALLOW TO DISSOLVE EVER 5 MIN AS NEEDED FOR CHEST PAIN 01/30/17  Yes Branch, Alphonse Guild, MD  alfuzosin (UROXATRAL) 10 MG 24 hr tablet Take 10 mg by mouth daily.  05/08/15   [provider]  aspirin EC 81 MG tablet Take 81 mg by mouth daily.    [provider]  atorvastatin (LIPITOR) 80 MG tablet Take 80 mg by mouth daily.    [provider]  gabapentin (NEURONTIN) 600 MG tablet Take 600 mg by mouth 3 (three) times daily.     [provider]  hydrochlorothiazide (HYDRODIURIL) 25 MG tablet Take 25 mg by mouth daily.    [provider]  lisinopril (PRINIVIL,ZESTRIL) 40 MG tablet Take 40 mg by mouth daily.    [provider]  metoprolol succinate (TOPROL-XL) 25 MG 24 hr tablet Take 25 mg by mouth daily. 05/15/16   [provider]  naproxen sodium (ANAPROX) 220 MG tablet Take 440 mg by mouth 2 (two) times daily as needed (for pain.).    [provider]  pantoprazole (PROTONIX) 20 MG tablet Take 20 mg by mouth daily. 05/21/16   [provider]  ranitidine (ZANTAC) 150 MG tablet Take 1 tablet (150 mg total) by mouth 2 (two) times daily. 06/03/17   Orpah Greek, MD  sucralfate (CARAFATE) 1 GM/10ML suspension Take 10 mLs (1 g total) by mouth 4 (four) times daily -  with meals and at bedtime.  06/03/17   Orpah Greek, MD  SUMAtriptan (IMITREX) 50 MG tablet Take 1 tablet by mouth every 2 (two) hours as needed for migraine or headache.  02/02/15   [provider]  tiZANidine (ZANAFLEX) 4 MG tablet Take 4 mg by mouth daily.    [provider]  Vitamin D, Ergocalciferol, (DRISDOL) 50000 UNITS CAPS capsule Take 1 capsule by mouth 2 (two) times a week. 02/02/15   [provider]    Family History Family History  Problem Relation Age of Onset  . Hypertension Mother   . Heart disease Mother   . Cirrhosis Father        DIED AT AGE 63  . Breast cancer Paternal Grandmother        DECEASED  . Colon cancer Neg Hx     Social History Social History   Tobacco Use  . Smoking status: Current Every  Day Smoker    Packs/day: 0.25    Years: 30.00    Pack years: 7.50    Types: Cigarettes    Start date: 11/22/1977  . Smokeless tobacco: Never Used  . Tobacco comment: 15 cigarettes a day  Substance Use Topics  . Alcohol use: Yes    Alcohol/week: 0.0 oz    Comment: occasionally  . Drug use: No     Allergies   Bee venom   Review of Systems Review of Systems  Respiratory: Negative for shortness of breath.   Gastrointestinal: Positive for abdominal pain.  All other systems reviewed and are negative.    Physical Exam Updated Vital Signs BP (!) 118/43   Pulse 70   Temp 98.6 F (37 C) (Oral)   Resp (!) 26   Ht 5\' 11"  (1.803 m)   Wt 111.1 kg (245 lb)   SpO2 98%   BMI 34.17 kg/m   Physical Exam  Constitutional: He is oriented to person, place, and time. He appears well-developed and well-nourished. No distress.  HENT:  Head: Normocephalic and atraumatic.  Right Ear: Hearing normal.  Left Ear: Hearing normal.  Nose: Nose normal.  Mouth/Throat: Oropharynx is clear and moist and mucous membranes are normal.  Eyes: Conjunctivae and EOM are normal. Pupils are equal, round, and reactive to light.  Neck: Normal range of motion. Neck supple.    Cardiovascular: Regular rhythm, S1 normal and S2 normal. Exam reveals no gallop and no friction rub.  No murmur heard. Pulmonary/Chest: Effort normal and breath sounds normal. No respiratory distress. He exhibits no tenderness.  Abdominal: Soft. Normal appearance and bowel sounds are normal. There is no hepatosplenomegaly. There is no tenderness. There is no rebound, no guarding, no tenderness at McBurney's point and negative Murphy's sign. No hernia.  Musculoskeletal: Normal range of motion.  Neurological: He is alert and oriented to person, place, and time. He has normal strength. No cranial nerve deficit or sensory deficit. Coordination normal. GCS eye subscore is 4. GCS verbal subscore is 5. GCS motor subscore is 6.  Skin: Skin is warm, dry and intact. No rash noted. No cyanosis.  Psychiatric: He has a normal mood and affect. His speech is normal and behavior is normal. Thought content normal.  Nursing note and vitals reviewed.    ED Treatments / Results  Labs (all labs ordered are listed, but only abnormal results are displayed) Labs Reviewed  COMPREHENSIVE METABOLIC PANEL - Abnormal; Notable for the following components:      Result Value   Glucose, Bld 133 (*)    BUN 22 (*)    Creatinine, Ser 1.37 (*)    GFR calc non Af Amer 57 (*)    All other components within normal limits  CBC WITH DIFFERENTIAL/PLATELET  LIPASE, BLOOD  I-STAT CG4 LACTIC ACID, ED  I-STAT TROPONIN, ED  I-STAT TROPONIN, ED    EKG  EKG Interpretation None       Radiology Ct Angio Chest/abd/pel For Dissection W &/or Wo Contrast  Result Date: 06/03/2017 CLINICAL DATA:  Sharp upper abdominal pain, bilateral rib pain, and right leg hurts. Ribs hurt when takes a deep breath. EXAM: CT ANGIOGRAPHY CHEST, ABDOMEN AND PELVIS TECHNIQUE: Multidetector CT imaging through the chest, abdomen and pelvis was performed using the standard protocol during bolus administration of intravenous contrast. Multiplanar  reconstructed images and MIPs were obtained and reviewed to evaluate the vascular anatomy. CONTRAST:  175mL ISOVUE-370 IOPAMIDOL (ISOVUE-370) INJECTION 76% COMPARISON:  None. FINDINGS: CTA CHEST FINDINGS Cardiovascular:  Noncontrast images of the chest demonstrate normal caliber of the aorta. No significant aortic calcifications. No intramural hematoma. Coronary artery calcifications are present. Calcified granuloma in the right lung base with calcified lymph nodes in the right hilum and subcarinal region. Images obtained during arterial phase of contrast administration demonstrate normal caliber of the aorta. No aortic dissection. Great vessels are patent. Central pulmonary arteries are patent. No evidence of significant pulmonary embolus. Normal heart size. No pericardial effusion. Mediastinum/Nodes: Mediastinal lymph nodes are not pathologically enlarged, likely reactive or postinflammatory. Esophagus is decompressed. Thyroid gland is unremarkable. Lungs/Pleura: Atelectasis in the lung bases, greater on the right. No consolidation or airspace disease. No pleural effusions. No pneumothorax. Airways are patent. Musculoskeletal: Degenerative changes in the spine. No destructive bone lesions. Review of the MIP images confirms the above findings. CTA ABDOMEN AND PELVIS FINDINGS VASCULAR Aorta: Normal caliber abdominal aorta. Mild scattered aortic calcifications. No aortic dissection. Celiac: Patent without evidence of aneurysm, dissection, vasculitis or significant stenosis. SMA: Patent without evidence of aneurysm, dissection, vasculitis or significant stenosis. Renals: Single right and duplicated left renal arteries are patent without evidence of stenosis or aneurysm. Renal nephrograms are symmetrical. IMA: Patent without evidence of aneurysm, dissection, vasculitis or significant stenosis. Inflow: Patent without evidence of aneurysm, dissection, vasculitis or significant stenosis. Veins: No obvious venous  abnormality within the limitations of this arterial phase study. Review of the MIP images confirms the above findings. NON-VASCULAR Hepatobiliary: Diffuse fatty infiltration of the liver. Contracted gallbladder without inflammatory changes. No bile duct dilatation. Pancreas: Unremarkable. No pancreatic ductal dilatation or surrounding inflammatory changes. Spleen: Normal in size without focal abnormality. Adrenals/Urinary Tract: Adrenal glands are unremarkable. Kidneys are normal, without renal calculi, focal lesion, or hydronephrosis. There is nodular increased density in the bladder base measuring 2.8 cm diameter. A hematoma or polypoid lesion could also have this appearance. Correlation with urinalysis is recommended. An asymmetrically enlarged prostate gland causing an impression on the bladder base would be a less likely consideration as the overall prostate gland is not markedly enlarged. No bladder wall thickening. Stomach/Bowel: Stomach is within normal limits. Appendix appears normal. No evidence of bowel wall thickening, distention, or inflammatory changes. Lymphatic: No significant lymphadenopathy. Reproductive: Enlarged prostate gland measuring 4.1 cm diameter. Other: No abdominal wall hernia or abnormality. No abdominopelvic ascites. Musculoskeletal: No acute or significant osseous findings. Review of the MIP images confirms the above findings. IMPRESSION: 1. No evidence of thoracic or abdominal aortic aneurysm or dissection. Minimal aortic atherosclerosis in the abdominal region. 2. No evidence of active pulmonary disease. Calcified granuloma in the right lung with calcified hilar and mediastinal lymph nodes. Atelectasis in the lung bases. 3. Diffuse fatty infiltration of the liver. 4. Nodular density in the bladder base may represent hematoma or polypoid lesion. Correlation with urinalysis and/or cystoscopy recommended. Also could consider possibility of enlarged prostate gland causing impression on  the bladder base. Mild prostate gland enlargement. Electronically Signed   By: Lucienne Capers M.D.   On: 06/03/2017 06:16    Procedures Procedures (including critical care time)  Medications Ordered in ED Medications  sodium chloride 0.9 % bolus 500 mL (0 mLs Intravenous Stopped 06/03/17 0522)  iopamidol (ISOVUE-370) 76 % injection 100 mL (100 mLs Intravenous Contrast Given 06/03/17 0543)     Initial Impression / Assessment and Plan / ED Course  I have reviewed the triage vital signs and the nursing notes.  Pertinent labs & imaging results that were available during my care of the patient were reviewed  by me and considered in my medical decision making (see chart for details).     Patient presents to the emergency department with complaints of upper abdominal pain.  He did have some mild radiation to the chest as well as into his back.  Pain is now radiating down both flanks.  He had mild epigastric tenderness but essentially a benign abdominal exam. Patient has a normal lactic acid, not likely to be mesenteric/bowel ischemia. CTA chest/abd.pelvis negative. EKG, troponin neg x2. Patient with h/o GERD, treat for possible gastritis.  Final Clinical Impressions(s) / ED Diagnoses   Final diagnoses:  Acute gastritis without hemorrhage, unspecified gastritis type    ED Discharge Orders        Ordered    sucralfate (CARAFATE) 1 GM/10ML suspension  3 times daily with meals & bedtime     06/03/17 0728    ranitidine (ZANTAC) 150 MG tablet  2 times daily     06/03/17 0728       Orpah Greek, MD 06/03/17 [phone]

## 2017-06-03 NOTE — ED Triage Notes (Signed)
Pt reports upper abdominal pain, bilateral rib/side pain and right leg hurts. Pt reports when he takes a deep breath, his ribs hurt. Pt states when he uses his inhaler, it decreases the pain in his ribs. Pt states any way he turns while lying down, it hurts to move. Pt had similar symptoms last month and the symptoms went away after several days.

## 2017-07-13 IMAGING — MR MR THORACIC SPINE WO/W CM
3 of 9 series · 8 of 48 positions shown · IV contrast (20ml Multihance)
Comparison: None.

CLINICAL DATA: Neck pain.  Upper back pain.  No known injury.

EXAM:
MRI CERVICAL AND THORACIC SPINE WITHOUT AND WITH CONTRAST
TECHNIQUE: Multiplanar and multiecho pulse sequences of the cervical spine, to
include the craniocervical junction and cervicothoracic junction,
and thoracic spine, were obtained without and with intravenous
contrast.
CONTRAST:  20mL MULTIHANCE GADOBENATE DIMEGLUMINE 529 MG/ML IV SOLN

[Series 5: T1 · sagittal · 4.0mm · 0.52mm/px · 2 of 15 slices shown]
[im 1/15]
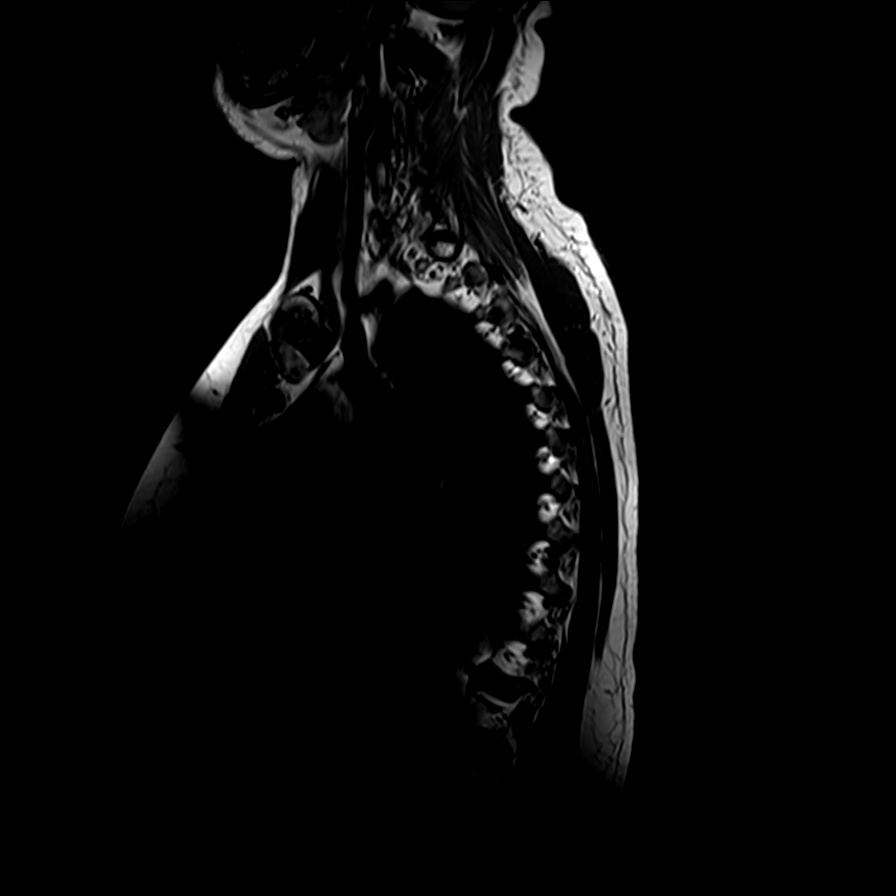
[im 10/15]
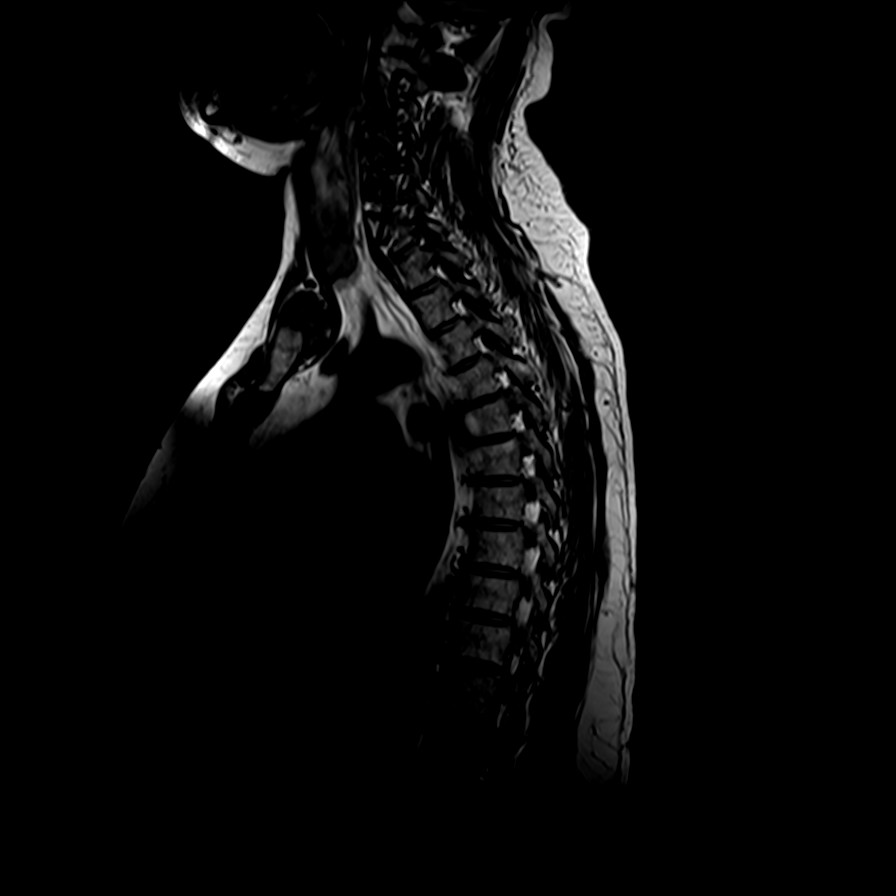

[Series 9: T2 · axial · 3.0mm · 0.24mm/px · z∈[-204,-52]mm · 3 of 35 slices shown (1 of 2)]
[im 5/35]
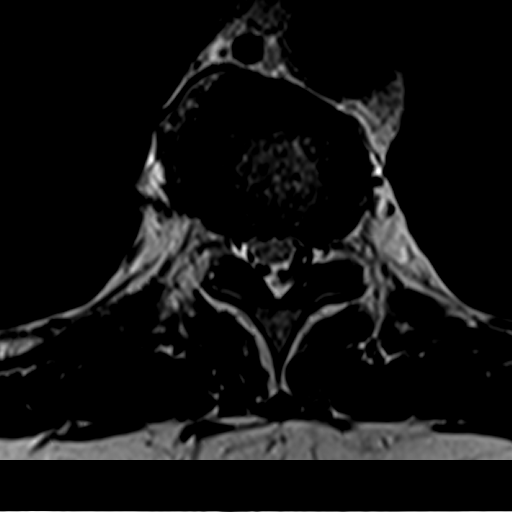
[im 20/35]
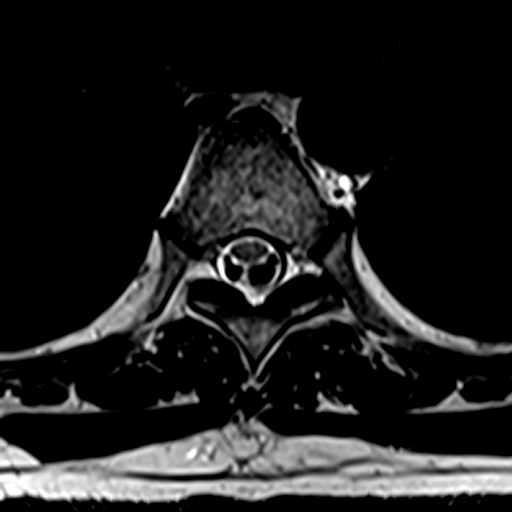
[im 30/35]
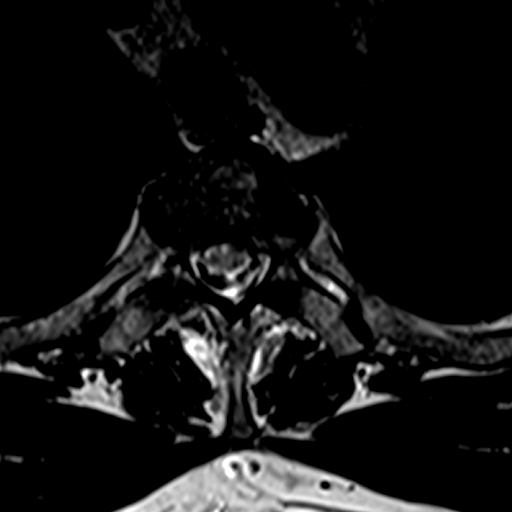

[Series 11: T2 · sagittal · 4.0mm · 0.38mm/px · 3 of 15 slices shown (2 of 2)]
[im 1/15]
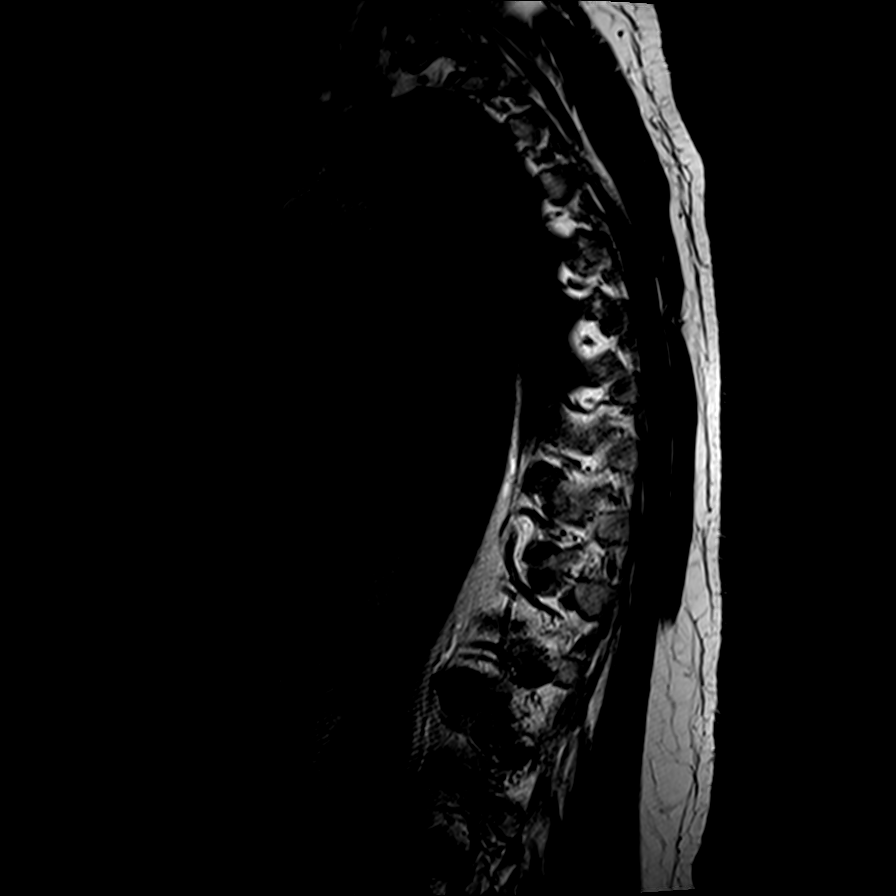
[im 8/15]
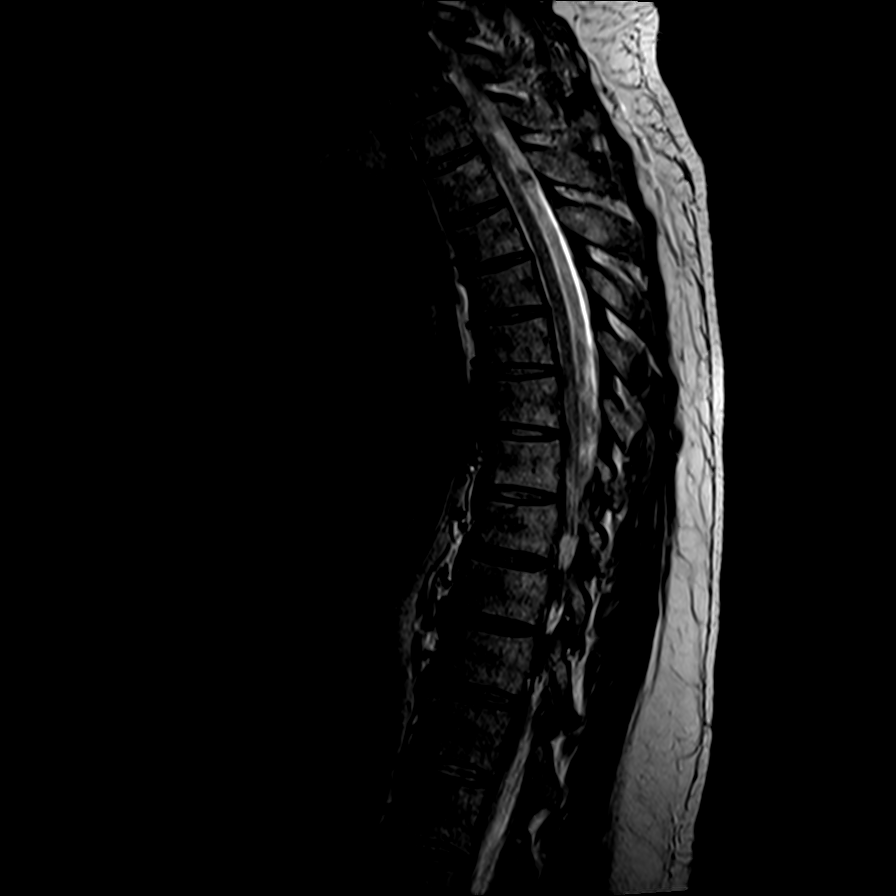
[im 15/15]
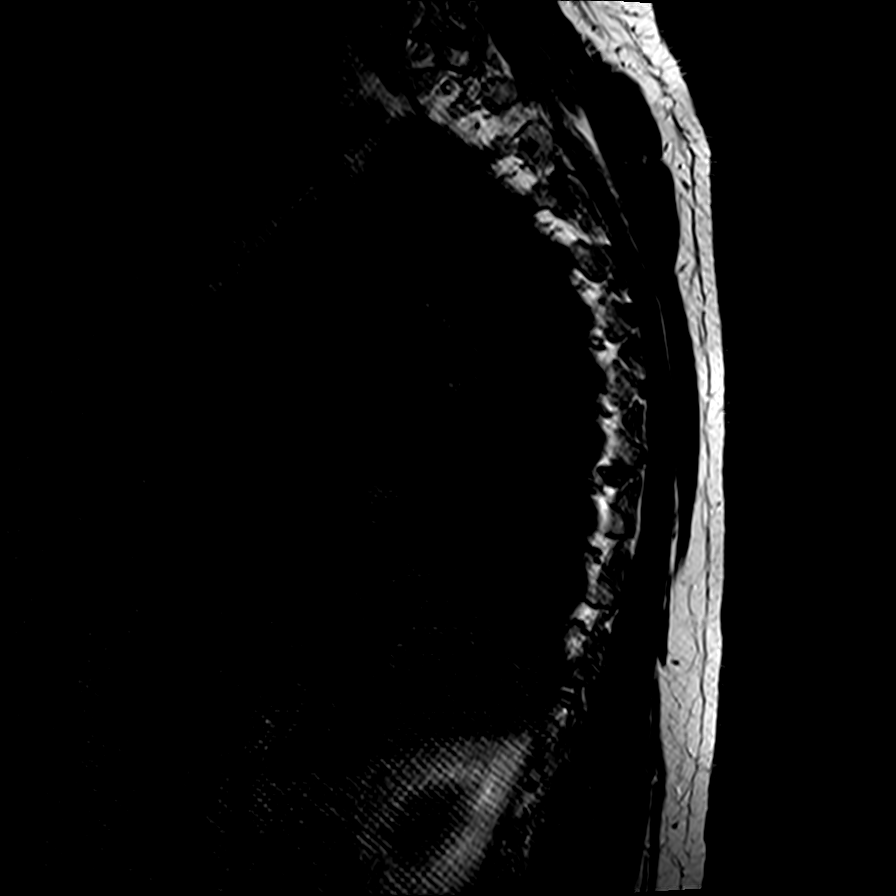

[8 of 48 positions shown; findings below may reference images not displayed]

FINDINGS: MRI CERVICAL SPINE FINDINGS

Alignment: Physiologic.

Vertebrae: No fracture, evidence of discitis, or bone lesion. No
worrisome postcontrast enhancement. Congenitally short pedicles.

Cord: Mild flattening most pronounced at C3-4 and C4-5. No abnormal
cord signal.

Posterior Fossa, vertebral arteries, paraspinal tissues: Negative.

Disc levels:

C2-3: Marked uncinate spurring on the RIGHT causes RIGHT C3
foraminal narrowing. There is no disc protrusion or cord
compression.

C3-4: Central disc protrusion, in conjunction with short pedicles
results in cord flattening. Canal diameter 5 mm. No C4 foraminal
narrowing.

C4-5: Central disc protrusion, in conjunction with short pedicles,
results in cord flattening. Canal diameter 6 mm. No C5 foraminal
narrowing.

C5-6: Prominent anterior osteophyte formation. Facet arthropathy.
Annular bulge. Mild stenosis. Slight cord flattening. Canal diameter
7 mm. No C6 foraminal narrowing.

C6-7:  Unremarkable disc space.  No impingement.

C7-T1:  Annular bulge.  Facet arthropathy.  No impingement.

MRI THORACIC SPINE FINDINGS

Segmentation:  Standard

Alignment:  Physiologic.

Vertebrae: No fracture, evidence of discitis, or bone lesion. Modic
type 1 changes are seen above and below T8-9 interspace,
predominantly RIGHT-sided, related to disc space narrowing. No
worrisome postcontrast enhancement.

Conus medullaris: Extends to the L1 level and appears normal. Normal
thoracic cord signal and morphology.

Paraspinal and other soft tissues: Unremarkable. Small pleural
effusions are incidentally noted.

Disc levels:

The thoracic disc spaces are unremarkable except for T7-8 where
there is a shallow central protrusion
IMPRESSION: Cervical spine demonstrates congenital and acquired stenosis most
prominent at C2-3, C3-4, and C4-5. The sagittal diameter of the
spinal canal at C3-4 measures 5 mm.

Modic type 1 changes at T8-9, with bone marrow edema predominantly
RIGHT-sided above and below the T8-9 interspace.

Shallow protrusion at T7-8 does not result in significant cord
displacement.

## 2017-11-28 ENCOUNTER — Other Ambulatory Visit: Payer: Self-pay | Admitting: Cardiovascular Disease

## 2017-12-28 ENCOUNTER — Other Ambulatory Visit: Payer: Self-pay | Admitting: Cardiovascular Disease

## 2018-01-12 ENCOUNTER — Ambulatory Visit: Payer: BLUE CROSS/BLUE SHIELD | Admitting: Nurse Practitioner

## 2018-01-12 ENCOUNTER — Encounter: Payer: Self-pay | Admitting: Nurse Practitioner

## 2018-01-12 VITALS — BP 142/80 | HR 83 | Ht 71.0 in | Wt 244.5 lb

## 2018-01-12 DIAGNOSIS — I10 Essential (primary) hypertension: Secondary | ICD-10-CM | POA: Diagnosis not present

## 2018-01-12 DIAGNOSIS — Z720 Tobacco use: Secondary | ICD-10-CM

## 2018-01-12 DIAGNOSIS — I739 Peripheral vascular disease, unspecified: Secondary | ICD-10-CM

## 2018-01-12 DIAGNOSIS — I25118 Atherosclerotic heart disease of native coronary artery with other forms of angina pectoris: Secondary | ICD-10-CM | POA: Diagnosis not present

## 2018-01-12 DIAGNOSIS — E785 Hyperlipidemia, unspecified: Secondary | ICD-10-CM | POA: Diagnosis not present

## 2018-01-12 MED ORDER — EZETIMIBE 10 MG PO TABS: 10.0000 mg | ORAL_TABLET | Freq: Every day | ORAL | 3 refills | Status: DC

## 2018-01-12 NOTE — Patient Instructions (Signed)
Medication Instructions:  Your physician has recommended you make the following change in your medication:  1- START Zetia 10 mg by mouth once a day.   Labwork: NONE  Testing/Procedures: NONE  Follow-Up: Your physician recommends that you schedule a follow-up appointment in: [place].    If you need a refill on your cardiac medications before your next appointment, please call your pharmacy.

## 2018-01-12 NOTE — Progress Notes (Signed)
Office Visit    Patient Name: Larry Pham Date of Encounter: 01/12/2018  Primary Care Provider:  Barry Dienes, NP Primary Cardiologist:  Kathlyn Sacramento, MD  Chief Complaint    52 year old male who presents for follow-up today related to a prior history of CAD, hypertension, hyperlipidemia, and peripheral arterial disease.  Past Medical History    Past Medical History:  Diagnosis Date  . CAD (coronary artery disease)    a. 2013 Inf STEMI s/p RCA DES; b. 04/2015 Cath: LM 40, LAD 40ost, 45m, D1 30, LCX 40m, OM1 50, RCA 30p, 20/3m, 50d. EF 55-65%.  . Carotid arterial disease ([place])    a. 07/2015 Carotid U/S: <50% bilat.  . Carpal tunnel syndrome   . GERD (gastroesophageal reflux disease)   . History of ETOH abuse   . Hyperlipidemia   . Hypertension   . MI (myocardial infarction) ([place]) 02-2012  . PAD (peripheral artery disease) ([place])    a. 06/2015 s/p L SFA stenting; b. 12/2016 PV Angio: R SFA 40-43m, L SFA patent stent-->Med Rx, cillostazol added.  . Seasonal allergies    Past Surgical History:  Procedure Laterality Date  . ABDOMINAL AORTOGRAM W/LOWER EXTREMITY N/A 01/04/2017   Procedure: ABDOMINAL AORTOGRAM W/LOWER EXTREMITY;  Surgeon: Wellington Hampshire, MD;  Location: [facility];  Service: Cardiovascular;  Laterality: N/A;  . CARDIAC CATHETERIZATION N/A 04/22/2015   Procedure: Left Heart Cath and Coronary Angiography;  Surgeon: Burnell Blanks, MD;  Location: [facility];  Service: Cardiovascular;  Laterality: N/A;  . CARDIAC CATHETERIZATION N/A 04/22/2015   Procedure: Intravascular Pressure Wire/FFR Study;  Surgeon: Burnell Blanks, MD;  Location: [facility];  Service: Cardiovascular;  Laterality: N/A;  . COLONOSCOPY WITH PROPOFOL N/A 03/23/2015   Procedure: COLONOSCOPY WITH PROPOFOL;  Surgeon: Daneil Dolin, MD;  Location: [facility];  Service: Endoscopy;  Laterality: N/A;  [place] time in 0849  time out  0959  total time 10 mintues  .  CORONARY ANGIOPLASTY    . CORONARY ARTERY CATHETERIZATION WITH STENTING N/A 02-2102 AND 05-2012  . LEFT FOREARM SURGERY     s/p machine accident  . LOWER EXTREMITY ANGIOGRAM Bilateral 07/01/2015   Procedure: Lower Extremity Angiogram;  Surgeon: Wellington Hampshire, MD;  Location: [facility];  Service: Cardiovascular;  Laterality: Bilateral;  . PERIPHERAL VASCULAR CATHETERIZATION N/A 07/01/2015   Procedure: Abdominal Aortogram;  Surgeon: Wellington Hampshire, MD;  Location: [facility];  Service: Cardiovascular;  Laterality: N/A;  . PERIPHERAL VASCULAR CATHETERIZATION Left 07/01/2015   Procedure: Peripheral Vascular Intervention;  Surgeon: Wellington Hampshire, MD;  Location: [facility];  Service: Cardiovascular;  Laterality: Left;  SFA  . POLYPECTOMY N/A 03/23/2015   Procedure: POLYPECTOMY;  Surgeon: Daneil Dolin, MD;  Location: [facility];  Service: Endoscopy;  Laterality: N/A;  cecal, descending colon  . TRIGGER FINGER RELEASE     right 5th digit    Allergies  Allergies  Allergen Reactions  . Bee Venom Swelling    History of Present Illness    52 year old male with the above past medical history including coronary artery disease status post inferior ST segment elevation myocardial infarction in 2013 requiring drug-eluting stent placement to the RCA.  Subsequent catheterization in December 2016 showed stable, moderate nonobstructive CAD and he has been medically managed since.  Other history includes peripheral arterial disease with left SFA stenting in February 2017, hypertension, hyperlipidemia, GERD, ongoing tobacco abuse, and history of EtOH  abuse.  In August 2018, he underwent peripheral angiography in the setting of worsening right lower extremity claudication.  Angiography revealed moderate nonobstructive right SFA disease with patent stent in the left SFA.  At follow-up in October 2018, he continued to note claudication and was placed on cilostazol.  Over the past 10 months,  he has been stable.  He thinks that the addition of cilostazol has been good and he has not had significant claudication.  He typically can walk about 150 yards from his car to his place of employment without significant claudication.  He says he will sometimes experience very mild chest tightness while making that walk which resolved within about 5 minutes after rest.  He says he has had similar, mild chest tightness with that walk dating back to the time of his last catheterization in December 2016.  Interestingly, when he leaves work and makes the walk in reverse, he does not have any chest discomfort.  He continues to smoke 1 pack/day.  He previously did not tolerate Chantix and points out that his work offers a smoking cessation program that he is now considering.  He denies dyspnea, PND, orthopnea, dizziness, syncope, edema, or early satiety.  Home Medications    Prior to Admission medications   Medication Sig Start Date End Date Taking? Authorizing Provider  albuterol (PROVENTIL HFA;VENTOLIN HFA) 108 (90 BASE) MCG/ACT inhaler Inhale 2 puffs into the lungs every 6 (six) hours as needed for wheezing or shortness of breath. 04/13/15  Yes BranchAlphonse Guild, MD  alfuzosin (UROXATRAL) 10 MG 24 hr tablet Take 10 mg by mouth daily.  05/08/15  Yes [provider]  aspirin EC 81 MG tablet Take 81 mg by mouth daily.   Yes [provider]  atorvastatin (LIPITOR) 80 MG tablet Take 80 mg by mouth daily.   Yes [provider]  cilostazol (PLETAL) 50 MG tablet TAKE 1 TABLET BY MOUTH TWICE A DAY 12/28/17  Yes Wellington Hampshire, MD  gabapentin (NEURONTIN) 600 MG tablet Take 600 mg by mouth 3 (three) times daily.    Yes [provider]  hydrochlorothiazide (HYDRODIURIL) 25 MG tablet Take 25 mg by mouth daily.   Yes [provider]  lisinopril (PRINIVIL,ZESTRIL) 40 MG tablet Take 40 mg by mouth daily.   Yes [provider]  metoprolol succinate (TOPROL-XL) 25 MG  24 hr tablet Take 25 mg by mouth daily. 05/15/16  Yes [provider]  naproxen sodium (ANAPROX) 220 MG tablet Take 440 mg by mouth 2 (two) times daily as needed (for pain.).   Yes [provider]  nitroGLYCERIN (NITROSTAT) 0.4 MG SL tablet PLACE 1 TABLET UNDER THE TONGUE & ALLOW TO DISSOLVE EVER 5 MIN AS NEEDED FOR CHEST PAIN 01/30/17  Yes Branch, Alphonse Guild, MD  pantoprazole (PROTONIX) 20 MG tablet Take 20 mg by mouth daily. 05/21/16  Yes [provider]  ranitidine (ZANTAC) 150 MG tablet Take 1 tablet (150 mg total) by mouth 2 (two) times daily. 06/03/17  Yes Pollina, Gwenyth Allegra, MD  sucralfate (CARAFATE) 1 GM/10ML suspension Take 10 mLs (1 g total) by mouth 4 (four) times daily -  with meals and at bedtime. 06/03/17  Yes Pollina, Gwenyth Allegra, MD  SUMAtriptan (IMITREX) 50 MG tablet Take 1 tablet by mouth every 2 (two) hours as needed for migraine or headache.  02/02/15  Yes [provider]  tiZANidine (ZANAFLEX) 4 MG tablet Take 4 mg by mouth daily.   Yes [provider]  Vitamin D, Ergocalciferol, (DRISDOL) 50000 UNITS CAPS capsule Take 1 capsule by mouth 2 (two) times a week. 02/02/15  Yes [provider]    Review of Systems    No significant claudication.  He does have chronic mild exertional chest tightness after walking about 150 yards which has been stable since late 2016.  He denies dyspnea, PND, orthopnea, dizziness, syncope, edema, or early satiety.  All other systems reviewed and are otherwise negative except as noted above.  Physical Exam    VS:  BP (!) 142/80 (BP Location: Left Arm, Patient Position: Sitting, Cuff Size: Large)   Pulse 83   Ht 5\' 11"  (1.803 m)   Wt 244 lb 8 oz (110.9 kg)   BMI 34.10 kg/m  , BMI Body mass index is 34.1 kg/m. GEN: Well nourished, well developed, in no acute distress. HEENT: normal. Neck: Supple, no JVD, carotid bruits, or masses. Cardiac: RRR, no murmurs, rubs, or gallops. No clubbing,  cyanosis, edema.  DP/PT 1+ and equal bilaterally.  Respiratory:  Respirations regular and unlabored, clear to auscultation bilaterally. GI: Soft, nontender, nondistended, BS + x 4. MS: no deformity or atrophy. Skin: warm and dry, no rash. Neuro:  Strength and sensation are intact. Psych: Normal affect.  Accessory Clinical Findings    ECG personally reviewed by me today -regular sinus rhythm, 83, nonspecific T changes- no acute changes.  October 20, 2017 labs from primary care Sodium 139, potassium 4.7, chloride 104, CO2 17, BUN 15, creatinine 1.03, glucose 86 Total protein 6.9, albumin 4.7, total bilirubin 0.3, alkaline phosphatase 122, AST 22 Hemoglobin 15.1, hematocrit 43.5, WBC 6.9, platelets 235 Total cholesterol 180, triglycerides 96, HDL 45, LDL 98  Assessment & Plan    1.  Coronary artery disease: Status post prior inferior STEMI in 2013 with drug-eluting stent placement.  Last catheterization December 2016 in the setting of exertional chest discomfort showed stable anatomy.  He says that over the past nearly 3 years, he has continued to have stable exertional chest tightness after walking about 150 yards into work.  He does not experience the symptoms when walking from work to his car at the end of the day.  Overall, he feels symptoms are stable and have not changed.  He remains on aspirin, statin, beta-blocker, ACE inhibitor.  2.  Peripheral arterial disease: Status post prior left SFA stenting with repeat procedure in August 2018 showing patent stent and nonobstructive right SFA disease.  He has done well over the past 10 months on cilostazol therapy and has not been having any significant claudication.  He otherwise remains on aspirin and statin therapy.  3.  Essential hypertension: Blood pressure elevated today.  He says he checks it frequently at work and it is typically in the 130s.  We discussed possibly going up on his Toprol XL 50 mg daily versus having him monitor his blood  pressure daily for a week and contacting us with results.  He prefers the latter approach.  If pressures are consistently greater than 130, we should plan to titrate beta-blocker.  4.  Hyperlipidemia: Recent LDL 98 in June of this year.  He is on high potency Lipitor therapy.  We discussed adding Zetia 10 mg daily and he is willing to proceed.  He is going to have follow-up with primary care in about 2 months, at which time he can have fasting lipids.  5.  Ongoing tobacco abuse: He continues to smoke a pack a day.  He says he is tried  Chantix but this caused vivid nightmares.  We discussed the use of nicotine patches or other alternatives.  He says he is not quite ready to quit but that his place of employment does offer a smoking cessation class.  He says he is going to look into it.  I strongly encouraged him to do so given his cardiac and peripheral vascular history.  6.  Disposition: He will have follow-up lipids and LFTs in about 2 months when he sees primary care.  I told him that if they do not draw, we would be happy to arrange though he lives in [place] and prefers to not have to drive here.  He will otherwise follow-up in 6 months or sooner if necessary.  Murray Hodgkins, NP 01/12/2018, 4:52 PM

## 2018-01-21 ENCOUNTER — Other Ambulatory Visit: Payer: Self-pay | Admitting: Cardiovascular Disease

## 2018-02-15 ENCOUNTER — Other Ambulatory Visit: Payer: Self-pay | Admitting: Cardiology

## 2018-02-28 ENCOUNTER — Telehealth: Payer: Self-pay | Admitting: Cardiology

## 2018-02-28 NOTE — Telephone Encounter (Signed)
I agree with your assessment and plan  J Anastasya Jewell MD 

## 2018-02-28 NOTE — Telephone Encounter (Signed)
Patient called stating that he took a leg cramp pill (bought over the counter) on 02-24-18. States that after taking this pill he started having tightness around his heart.

## 2018-02-28 NOTE — Telephone Encounter (Signed)
Patient c/o chest tightness on this past Saturday after taking OTC leg cramping pills that was rated 5-6/10. Did not take nitroglycerin. No c/o sob or dizziness.   Denies active chest pain. Appointment given to see Dr. [name] on 03/26/18 @2 :00 pm at the [facility] office. Advised patient not to take the OTC cramping pills anymore and if his symptoms return or get worse, to go to the ED for an evaluation. Also advised patient to contact his PCP to be seen sooner and if his PCP felt he needed a sooner appointment, to have them contact us directly. Verbalized understanding of plan.

## 2018-03-26 ENCOUNTER — Encounter: Payer: Self-pay | Admitting: Cardiology

## 2018-03-26 ENCOUNTER — Ambulatory Visit: Payer: BLUE CROSS/BLUE SHIELD | Admitting: Cardiology

## 2018-03-26 VITALS — BP 152/80 | HR 94 | Ht 71.5 in | Wt 239.0 lb

## 2018-03-26 DIAGNOSIS — I2510 Atherosclerotic heart disease of native coronary artery without angina pectoris: Secondary | ICD-10-CM | POA: Diagnosis not present

## 2018-03-26 DIAGNOSIS — I10 Essential (primary) hypertension: Secondary | ICD-10-CM | POA: Diagnosis not present

## 2018-03-26 DIAGNOSIS — M79604 Pain in right leg: Secondary | ICD-10-CM | POA: Diagnosis not present

## 2018-03-26 DIAGNOSIS — E782 Mixed hyperlipidemia: Secondary | ICD-10-CM

## 2018-03-26 DIAGNOSIS — M79605 Pain in left leg: Secondary | ICD-10-CM

## 2018-03-26 NOTE — Patient Instructions (Signed)
Medication Instructions:  Your physician recommends that you continue on your current medications as directed. Please refer to the Current Medication list given to you today.  If you need a refill on your cardiac medications before your next appointment, please call your pharmacy.   Lab work: none If you have labs (blood work) drawn today and your tests are completely normal, you will receive your results only by: Marland Kitchen MyChart Message (if you have MyChart) OR . A paper copy in the mail If you have any lab test that is abnormal or we need to change your treatment, we will call you to review the results.  Testing/Procedures: none  Follow-Up: At [facility], you and your health needs are our priority.  As part of our continuing mission to provide you with exceptional heart care, we have created designated Provider Care Teams.  These Care Teams include your primary Cardiologist (physician) and Advanced Practice Providers (APPs -  Physician Assistants and Nurse Practitioners) who all work together to provide you with the care you need, when you need it. You will need a follow up appointment in 6 months.  Please call our office 2 months in advance to schedule this appointment.  You may see Dr.Branch  or one of the following Advanced Practice Providers on your designated Care Team:   [place], PA-C [facility]) . Ermalinda Barrios, PA-C ([place])  Any Other Special Instructions Will Be Listed Below (If Applicable). None

## 2018-03-26 NOTE — Progress Notes (Signed)
Clinical Summary Mr. Pfiester is a 52 y.o.male seen today for follow up of the following medical problems.  1. CAD  - prior PCI to RCA in [place], [place] in setting of inferior STEMI. Noted distal LM disease of 50% at that time.  - continued to have symptoms, seen at [facility]. [facility] MRI showed small RCA infarct with no current ischemic defects.  - 02/2012 echo 50-55% - Jan 2014 cath at [facility] with PCI to RCA with DES, left main disease described as 40% - Jan 2015 MPI without ischemia - 12/2014 MPI without clear ischemia.Small inferoseptal defect with mild reversibility, attenuation artifact vs small area of ischemia 04/2015 cath with moderate LM disease, patent RCA stents, mod LAD disease, severe stneosis small diastal LCX too small for PCI.  - imdur in the past reportedly made chest pain symptoms worst.    - isolated episode of chest pain 1 month, shortly after taking an over the counter pill for leg pains. Sharp pain mid chest to left chest, no other symptoms. Lasted 24 hours. Worst with position. - no recurrent pain.  - compliant with meds    2. Hyperlipidemia  - compliant with high dose statin and zetia.   3. HTN   compliant withmeds - checks at home typically 120s/70s  5. PAD - history of left SFA stenting in 06/2015 - angiography 12/2016 showed moderate nonobstructive right SFA disease and patent left SFA stent - has been on cilostazol  - still some pain in right calf at about 75 yards.     Has FMLA to fill out today for potential future missed worked days, currently working full schedule without limitations.     Past Medical History:  Diagnosis Date  . CAD (coronary artery disease)    a. 2013 Inf STEMI s/p RCA DES; b. 04/2015 Cath: LM 40, LAD 40ost, 69m, D1 30, LCX 78m, OM1 50, RCA 30p, 20/73m, 50d. EF 55-65%.  . Carotid arterial disease ([place])    a. 07/2015 Carotid U/S: <50% bilat.  . Carpal tunnel syndrome   . GERD (gastroesophageal reflux disease)   .  History of ETOH abuse   . Hyperlipidemia   . Hypertension   . MI (myocardial infarction) ([place]) 02-2012  . PAD (peripheral artery disease) ([place])    a. 06/2015 s/p L SFA stenting; b. 12/2016 PV Angio: R SFA 40-11m, L SFA patent stent-->Med Rx, cillostazol added.  . Seasonal allergies      Allergies  Allergen Reactions  . Bee Venom Swelling     Current Outpatient Medications  Medication Sig Dispense Refill  . albuterol (PROVENTIL HFA;VENTOLIN HFA) 108 (90 BASE) MCG/ACT inhaler Inhale 2 puffs into the lungs every 6 (six) hours as needed for wheezing or shortness of breath. 1 Inhaler 2  . alfuzosin (UROXATRAL) 10 MG 24 hr tablet Take 10 mg by mouth daily.   11  . aspirin EC 81 MG tablet Take 81 mg by mouth daily.    Marland Kitchen atorvastatin (LIPITOR) 80 MG tablet Take 80 mg by mouth daily.    . cilostazol (PLETAL) 50 MG tablet TAKE 1 TABLET BY MOUTH TWICE A DAY 60 tablet 3  . cilostazol (PLETAL) 50 MG tablet TAKE 1 TABLET BY MOUTH TWICE A DAY 60 tablet 0  . ezetimibe (ZETIA) 10 MG tablet Take 1 tablet (10 mg total) by mouth daily. 90 tablet 3  . gabapentin (NEURONTIN) 600 MG tablet Take 600 mg by mouth 3 (three) times daily.     . hydrochlorothiazide (  HYDRODIURIL) 25 MG tablet Take 25 mg by mouth daily.    Marland Kitchen lisinopril (PRINIVIL,ZESTRIL) 40 MG tablet Take 40 mg by mouth daily.    . metoprolol succinate (TOPROL-XL) 25 MG 24 hr tablet Take 25 mg by mouth daily.  5  . naproxen sodium (ANAPROX) 220 MG tablet Take 440 mg by mouth 2 (two) times daily as needed (for pain.).    Marland Kitchen nitroGLYCERIN (NITROSTAT) 0.4 MG SL tablet PLACE 1 TABLET UNDER THE TONGUE & ALLOW TO DISSOLVE EVERY 5 MIN AS NEEDED FOR CHEST PAIN 25 tablet 3  . pantoprazole (PROTONIX) 20 MG tablet Take 20 mg by mouth daily.  5  . ranitidine (ZANTAC) 150 MG tablet Take 1 tablet (150 mg total) by mouth 2 (two) times daily. 60 tablet 0  . sucralfate (CARAFATE) 1 GM/10ML suspension Take 10 mLs (1 g total) by mouth 4 (four) times daily -  with meals  and at bedtime. 420 mL 0  . SUMAtriptan (IMITREX) 50 MG tablet Take 1 tablet by mouth every 2 (two) hours as needed for migraine or headache.   0  . tiZANidine (ZANAFLEX) 4 MG tablet Take 4 mg by mouth daily.    . Vitamin D, Ergocalciferol, (DRISDOL) 50000 UNITS CAPS capsule Take 1 capsule by mouth 2 (two) times a week.  5   No current facility-administered medications for this visit.      Past Surgical History:  Procedure Laterality Date  . ABDOMINAL AORTOGRAM W/LOWER EXTREMITY N/A 01/04/2017   Procedure: ABDOMINAL AORTOGRAM W/LOWER EXTREMITY;  Surgeon: Wellington Hampshire, MD;  Location: [place] [facility];  Service: Cardiovascular;  Laterality: N/A;  . CARDIAC CATHETERIZATION N/A 04/22/2015   Procedure: Left Heart Cath and Coronary Angiography;  Surgeon: Burnell Blanks, MD;  Location: [place] [facility];  Service: Cardiovascular;  Laterality: N/A;  . CARDIAC CATHETERIZATION N/A 04/22/2015   Procedure: Intravascular Pressure Wire/FFR Study;  Surgeon: Burnell Blanks, MD;  Location: [place] [facility];  Service: Cardiovascular;  Laterality: N/A;  . COLONOSCOPY WITH PROPOFOL N/A 03/23/2015   Procedure: COLONOSCOPY WITH PROPOFOL;  Surgeon: Daneil Dolin, MD;  Location: [facility];  Service: Endoscopy;  Laterality: N/A;  Cecum time in 0849  time out  0959  total time 10 mintues  . CORONARY ANGIOPLASTY    . CORONARY ARTERY CATHETERIZATION WITH STENTING N/A 02-2102 AND 05-2012  . LEFT FOREARM SURGERY     s/p machine accident  . LOWER EXTREMITY ANGIOGRAM Bilateral 07/01/2015   Procedure: Lower Extremity Angiogram;  Surgeon: Wellington Hampshire, MD;  Location: [place] [facility];  Service: Cardiovascular;  Laterality: Bilateral;  . PERIPHERAL VASCULAR CATHETERIZATION N/A 07/01/2015   Procedure: Abdominal Aortogram;  Surgeon: Wellington Hampshire, MD;  Location: [place] [facility];  Service: Cardiovascular;  Laterality: N/A;  . PERIPHERAL VASCULAR CATHETERIZATION Left 07/01/2015   Procedure:  Peripheral Vascular Intervention;  Surgeon: Wellington Hampshire, MD;  Location: [place] [facility];  Service: Cardiovascular;  Laterality: Left;  SFA  . POLYPECTOMY N/A 03/23/2015   Procedure: POLYPECTOMY;  Surgeon: Daneil Dolin, MD;  Location: [facility];  Service: Endoscopy;  Laterality: N/A;  cecal, descending colon  . TRIGGER FINGER RELEASE     right 5th digit     Allergies  Allergen Reactions  . Bee Venom Swelling      Family History  Problem Relation Age of Onset  . Hypertension Mother   . Heart disease Mother   . Cirrhosis Father  DIED AT AGE 53  . Breast cancer Paternal Grandmother        DECEASED  . Colon cancer Neg Hx      Social History Mr. Nantz reports that he has been smoking cigarettes. He started smoking about 40 years ago. He has a 7.50 pack-year smoking history. He has never used smokeless tobacco. Mr. Bowns reports that he drinks alcohol.   Review of Systems CONSTITUTIONAL: No weight loss, fever, chills, weakness or fatigue.  HEENT: Eyes: No visual loss, blurred vision, double vision or yellow sclerae.No hearing loss, sneezing, congestion, runny nose or sore throat.  SKIN: No rash or itching.  CARDIOVASCULAR: per hpi RESPIRATORY: No shortness of breath, cough or sputum.  GASTROINTESTINAL: No anorexia, nausea, vomiting or diarrhea. No abdominal pain or blood.  GENITOURINARY: No burning on urination, no polyuria NEUROLOGICAL: No headache, dizziness, syncope, paralysis, ataxia, numbness or tingling in the extremities. No change in bowel or bladder control.  MUSCULOSKELETAL: right calf pain with walking.  LYMPHATICS: No enlarged nodes. No history of splenectomy.  PSYCHIATRIC: No history of depression or anxiety.  ENDOCRINOLOGIC: No reports of sweating, cold or heat intolerance. No polyuria or polydipsia.  Marland Kitchen   Physical Examination Vitals:   03/26/18 1352  BP: (!) 152/80  Pulse: 94  SpO2: 98%   Vitals:   03/26/18 1352  Weight: 239 lb (108.4 kg)    Height: 5' 11.5" (1.816 m)    Gen: resting comfortably, no acute distress HEENT: no scleral icterus, pupils equal round and reactive, no palptable cervical adenopathy,  CV: RRR, no m/r/g, no jvd Resp: Clear to auscultation bilaterally GI: abdomen is soft, non-tender, non-distended, normal bowel sounds, no hepatosplenomegaly MSK: extremities are warm, no edema.  Skin: warm, no rash Neuro:  no focal deficits Psych: appropriate affect   Diagnostic Studies 05/2013 MPI Analysis of the raw data showed no increased extracardiac radiotracer uptake. Analysis of the perfusion images showed a small mild fixed mid inferior wall defect. Left ventricular cavity size was normal. Regional wall motion was normal, indicating that the defect was due to soft tissue attenuation artifact. Left ventricular systolic function was calculated to be mildly reduced, EF 42%. However, systolic function appeared to be normal with an approximated EF of 50%.  IMPRESSION: 1. Normal myocardial perfusion imaging. No evidence of ischemia or scar.  2. Left ventricular systolic function was calculated to be mildly reduced, EF 42%. However, systolic function appeared to be grossly normal with an approximated EF of 50%.   05/2012 Cath at [facility] Cardiac Catheterization Results: Coronary arteries: Left main: 40% distal Left anterior descending: 30% ostial, scattered irregs Left circumflex: 40% mid, scattered irregs Right coronary: widely patent proximal stent, 70% mid, 40% at angle, diffuse irregs FFR of left main into LAD: 0.81  PCI Percutaneous coronary intervention performed of the mid RCA lesion which was stented with a 3.0 mm x 60mm Promus DES Results: 70% to normal  INTERVENTIONAL IMPRESSIONS and RECOMMENDATIONS: Successful PCI of the mid RCA lesion Aspirin 81 mg daily indefinitely Ticagrelor (Brilinta) 90 mg PO BID for at least 12 months Risk factor modification, smoking cessation  12/2014  Lexiscan MPI  This is a low risk study.  The left ventricular ejection fraction is mildly decreased (45-54%).  Small mild inferoseptal defect with mild reversibility. Differences in attenuation verse small area of ischemia, overall low risk finding.    04/2015 Carotid US IMPRESSION: No hemodynamically significant stenosis or plaque is noted in either cervical carotid artery.  04/2015 Cath  Ost 1st Mrg to 1st Mrg lesion, 50% stenosed.  1. Moderate left main stenosis, not flow limiting by FFR (FFR of 0.93) 2. Patent stents mid RCA with minimal restenosis.  3. Moderate distal RCA stenosis.  4. Moderate ostial LAD stenosis.  5. Severe stenosis small caliber distal AV groove Circumflex beyond the takeoff of the large OM Zyon Rosser. (too small for PCI) 6. Normal LV systolic function  Recommendations: He has moderate disease as described above with severe disease in the small caliber distal Circumflex. This vessel is too small for PCI. Continue medical management.    04/2015 Echo Study Conclusions  - Left ventricle: The cavity size was normal. Wall thickness was increased in a pattern of mild LVH. Systolic function was normal. The estimated ejection fraction was in the range of 50% to 55%. Features are consistent with a pseudonormal left ventricular filling pattern, with concomitant abnormal relaxation and increased filling pressure (grade 2 diastolic dysfunction). Doppler parameters are consistent with high ventricular filling pressure. - Regional wall motion abnormality: Mild hypokinesis of the mid inferoseptal, mid inferior, and mid inferolateral myocardium. - Aortic valve: Mildly calcified annulus. Trileaflet. - Mitral valve: Calcified annulus.  12/2016 LE aniography 1. No significant aortoiliac disease. 2. Right lower extremity: Mild diffuse SFA disease with focal moderate 40-50% stenosis in the midsegment. Three-vessel runoff below the knee. 3. Left  lower extremity: Mild diffuse SFA disease with patent stent in the midsegment with mild to moderate in-stent restenosis in three-vessel runoff below the knee.  Recommendations: I don't see a clear culprit for right calf claudication. Recommend continuing medical therapy and smoking cessation. Cilostazol can be considered.  Assessment and Plan  1. CAD -doing well without cardiac chest pain, continue current meds  2. Leg pains - continue medical therapy, recent cath with nonobstructive disease  3. Hyperlipidemia  - continue current therapy, request labs from pcp  4. HTN  - history of white coat HTN, home bp's are goal. Continue current meds     Arnoldo Lenis, M.D.

## 2018-03-27 ENCOUNTER — Encounter: Payer: Self-pay | Admitting: Cardiology

## 2018-05-15 ENCOUNTER — Ambulatory Visit: Payer: BLUE CROSS/BLUE SHIELD | Admitting: Cardiology

## 2018-07-17 ENCOUNTER — Encounter: Payer: Self-pay | Admitting: Cardiovascular Disease

## 2018-07-17 ENCOUNTER — Ambulatory Visit: Payer: BLUE CROSS/BLUE SHIELD | Admitting: Cardiovascular Disease

## 2018-07-17 VITALS — BP 150/86 | HR 65 | Ht 71.0 in | Wt 239.0 lb

## 2018-07-17 DIAGNOSIS — I739 Peripheral vascular disease, unspecified: Secondary | ICD-10-CM

## 2018-07-17 DIAGNOSIS — E785 Hyperlipidemia, unspecified: Secondary | ICD-10-CM

## 2018-07-17 DIAGNOSIS — I10 Essential (primary) hypertension: Secondary | ICD-10-CM

## 2018-07-17 DIAGNOSIS — I2510 Atherosclerotic heart disease of native coronary artery without angina pectoris: Secondary | ICD-10-CM | POA: Diagnosis not present

## 2018-07-17 DIAGNOSIS — Z720 Tobacco use: Secondary | ICD-10-CM

## 2018-07-17 NOTE — Patient Instructions (Signed)
Medication Instructions:  Dr.Arida recommends that you continue on your current medications as directed. Please refer to the Current Medication list given to you today.  If you need a refill on your cardiac medications before your next appointment, please call your pharmacy.   Lab work: None ordered  Testing/Procedures: Dr.Arida has requested that you have an ankle brachial index (ABI). During this test an ultrasound and blood pressure cuff are used to evaluate the arteries that supply the arms and legs with blood. Allow thirty minutes for this exam. There are no restrictions or special instructions.  Dr.Arida has requested that you have a lower extremity arterial exercise duplex. During this test, exercise and ultrasound are used to evaluate arterial blood flow in the legs. Allow one hour for this exam. There are no restrictions or special instructions.    Follow-Up: At [facility], you and your health needs are our priority.  As part of our continuing mission to provide you with exceptional heart care, we have created designated Provider Care Teams.  These Care Teams include your primary Cardiologist (physician) and Advanced Practice Providers (APPs -  Physician Assistants and Nurse Practitioners) who all work together to provide you with the care you need, when you need it. You will need a follow up appointment in 6 months.  Please call our office 2 months in advance to schedule this appointment.  You may see Kathlyn Sacramento, MD or one of the following Advanced Practice Providers on your designated Care Team:   Murray Hodgkins, NP Christell Faith, PA-C . Marrianne Mood, PA-C

## 2018-07-17 NOTE — Progress Notes (Signed)
Cardiology Office Note   Date:  07/17/2018   ID:  Larry Pham, Larry Pham 07-18-1965, MRN [id number]  PCP:  Barry Dienes, NP  Cardiologist:  Dr. [name]  Chief Complaint  Patient presents with  . other    6 mo  f/u SOB with exertion. Medications reviewed verbally with the patient.       History of Present Illness: Larry Pham is a 53 y.o. male who presents for a follow-up visit regarding peripheral arterial disease. He has known history of coronary artery disease status post RCA PCI in the setting of inferior ST elevation myocardial infarction in 2013. He had cardiac catheterization done in December 2016 which showed moderate left main stenosis which was not significant by FFR, patent RCA stents and severe stenosis in the small distal left circumflex which was left to be treated medically.He has known history of hyperlipidemia and tobacco use.  He is known to have peripheral arterial disease with claudication. Angiography in February 2017 showed short occlusion of the mid to distal left SFA with two-vessel runoff below the knee. No significant obstructive disease affecting the right lower extremity. I performed successful self-expanding stent placement to the left SFA without complications.   He had atypical right calf claudication in 2018.  Angiography and August 2018 showed focal moderate 40-50% stenosis in the right mid SFA. The left SFA stent was patent with mild in-stent restenosis. No revascularization was needed.  The patient is doing well today. He has shortness of breath with exertion. Currently still smoking 1 PPD, but has been trying to quit using patches. His right calf still has pain with exertion and has to stop and rest after walking 100-200 yards. There has not been change in his leg pain. States there is no pain when he is at rest. Occasionally feels numbness in his legs likely due to neuropathy. He denies chest pain, palpitations or any other related symptoms or  complaints at this time.     Past Medical History:  Diagnosis Date  . CAD (coronary artery disease)    a. 2013 Inf STEMI s/p RCA DES; b. 04/2015 Cath: LM 40, LAD 40ost, 46m, D1 30, LCX 33m, OM1 50, RCA 30p, 20/78m, 50d. EF 55-65%.  . Carotid arterial disease ([place])    a. 07/2015 Carotid U/S: <50% bilat.  . Carpal tunnel syndrome   . GERD (gastroesophageal reflux disease)   . History of ETOH abuse   . Hyperlipidemia   . Hypertension   . MI (myocardial infarction) ([place]) 02-2012  . PAD (peripheral artery disease) ([place])    a. 06/2015 s/p L SFA stenting; b. 12/2016 PV Angio: R SFA 40-74m, L SFA patent stent-->Med Rx, cillostazol added.  . Seasonal allergies     Past Surgical History:  Procedure Laterality Date  . ABDOMINAL AORTOGRAM W/LOWER EXTREMITY N/A 01/04/2017   Procedure: ABDOMINAL AORTOGRAM W/LOWER EXTREMITY;  Surgeon: Wellington Hampshire, MD;  Location: [place] [facility];  Service: Cardiovascular;  Laterality: N/A;  . CARDIAC CATHETERIZATION N/A 04/22/2015   Procedure: Left Heart Cath and Coronary Angiography;  Surgeon: Burnell Blanks, MD;  Location: [place] [facility];  Service: Cardiovascular;  Laterality: N/A;  . CARDIAC CATHETERIZATION N/A 04/22/2015   Procedure: Intravascular Pressure Wire/FFR Study;  Surgeon: Burnell Blanks, MD;  Location: [place] [facility];  Service: Cardiovascular;  Laterality: N/A;  . COLONOSCOPY WITH PROPOFOL N/A 03/23/2015   Procedure: COLONOSCOPY WITH PROPOFOL;  Surgeon: Daneil Dolin, MD;  [facility]: [facility];  Service:  Endoscopy;  Laterality: N/A;  Cecum time in 0849  time out  0959  total time 10 mintues  . CORONARY ANGIOPLASTY    . CORONARY ARTERY CATHETERIZATION WITH STENTING N/A 02-2102 AND 05-2012  . LEFT FOREARM SURGERY     s/p machine accident  . LOWER EXTREMITY ANGIOGRAM Bilateral 07/01/2015   Procedure: Lower Extremity Angiogram;  Surgeon: Wellington Hampshire, MD;  Location: [place] [facility];  Service: Cardiovascular;  Laterality:  Bilateral;  . PERIPHERAL VASCULAR CATHETERIZATION N/A 07/01/2015   Procedure: Abdominal Aortogram;  Surgeon: Wellington Hampshire, MD;  Location: [place] [facility];  Service: Cardiovascular;  Laterality: N/A;  . PERIPHERAL VASCULAR CATHETERIZATION Left 07/01/2015   Procedure: Peripheral Vascular Intervention;  Surgeon: Wellington Hampshire, MD;  Location: [place] [facility];  Service: Cardiovascular;  Laterality: Left;  SFA  . POLYPECTOMY N/A 03/23/2015   Procedure: POLYPECTOMY;  Surgeon: Daneil Dolin, MD;  Location: [facility];  Service: Endoscopy;  Laterality: N/A;  cecal, descending colon  . TRIGGER FINGER RELEASE     right 5th digit     Current Outpatient Medications  Medication Sig Dispense Refill  . albuterol (PROVENTIL HFA;VENTOLIN HFA) 108 (90 BASE) MCG/ACT inhaler Inhale 2 puffs into the lungs every 6 (six) hours as needed for wheezing or shortness of breath. 1 Inhaler 2  . alfuzosin (UROXATRAL) 10 MG 24 hr tablet Take 10 mg by mouth daily.   11  . aspirin EC 81 MG tablet Take 81 mg by mouth daily.    Marland Kitchen atorvastatin (LIPITOR) 80 MG tablet Take 80 mg by mouth daily.    . cilostazol (PLETAL) 50 MG tablet TAKE 1 TABLET BY MOUTH TWICE A DAY 60 tablet 3  . cilostazol (PLETAL) 50 MG tablet TAKE 1 TABLET BY MOUTH TWICE A DAY 60 tablet 0  . gabapentin (NEURONTIN) 600 MG tablet Take 600 mg by mouth 3 (three) times daily.     . hydrochlorothiazide (HYDRODIURIL) 25 MG tablet Take 25 mg by mouth daily.    Marland Kitchen lisinopril (PRINIVIL,ZESTRIL) 40 MG tablet Take 40 mg by mouth daily.    . metoprolol succinate (TOPROL-XL) 25 MG 24 hr tablet Take 25 mg by mouth daily.  5  . naproxen sodium (ANAPROX) 220 MG tablet Take 440 mg by mouth 2 (two) times daily as needed (for pain.).    Marland Kitchen nitroGLYCERIN (NITROSTAT) 0.4 MG SL tablet PLACE 1 TABLET UNDER THE TONGUE & ALLOW TO DISSOLVE EVERY 5 MIN AS NEEDED FOR CHEST PAIN 25 tablet 3  . pantoprazole (PROTONIX) 20 MG tablet Take 20 mg by mouth daily.  5  . ranitidine (ZANTAC)  150 MG tablet Take 1 tablet (150 mg total) by mouth 2 (two) times daily. 60 tablet 0  . sucralfate (CARAFATE) 1 GM/10ML suspension Take 10 mLs (1 g total) by mouth 4 (four) times daily -  with meals and at bedtime. 420 mL 0  . SUMAtriptan (IMITREX) 50 MG tablet Take 1 tablet by mouth every 2 (two) hours as needed for migraine or headache.   0  . tiZANidine (ZANAFLEX) 4 MG tablet Take 4 mg by mouth daily.    . Vitamin D, Ergocalciferol, (DRISDOL) 50000 UNITS CAPS capsule Take 1 capsule by mouth 2 (two) times a week.  5  . ezetimibe (ZETIA) 10 MG tablet Take 1 tablet (10 mg total) by mouth daily. 90 tablet 3   No current facility-administered medications for this visit.     Allergies:   Bee venom  Social History:  The patient  reports that he has been smoking cigarettes. He started smoking about 40 years ago. He has a 7.50 pack-year smoking history. He has never used smokeless tobacco. He reports current alcohol use. He reports that he does not use drugs.   Family History:  The patient's family history includes Breast cancer in his paternal grandmother; Cirrhosis in his father; Heart disease in his mother; Hypertension in his mother.    ROS:  Please see the history of present illness.   Otherwise, review of systems are positive for none.   All other systems are reviewed and negative.    PHYSICAL EXAM: VS:  BP (!) 150/86 (BP Location: Left Arm, Patient Position: Sitting, Cuff Size: Normal)   Pulse 65   Ht 5\' 11"  (1.803 m)   Wt 239 lb (108.4 kg)   BMI 33.33 kg/m  , BMI Body mass index is 33.33 kg/m. GEN: Well nourished, well developed, in no acute distress  HEENT: normal  Neck: no JVD, carotid bruits, or masses Cardiac: RRR; no murmurs, rubs, or gallops,no edema Respiratory:  clear to auscultation bilaterally, normal work of breathing GI: soft, nontender, nondistended, + BS MS: no deformity or atrophy  Skin: warm and dry, no rash Neuro:  Strength and sensation are intact Psych:  euthymic mood, full affect   EKG: EKG is ordered today. The ekg ordered today demonstrates normal sinus rhythm with no significant ST or T wave changes.   Recent Labs: No results found for requested labs within last 8760 hours.    Lipid Panel No results found for: CHOL, TRIG, HDL, CHOLHDL, VLDL, LDLCALC, LDLDIRECT    Wt Readings from Last 3 Encounters:  07/17/18 239 lb (108.4 kg)  03/26/18 239 lb (108.4 kg)  01/12/18 244 lb 8 oz (110.9 kg)      Other studies Reviewed:   ASSESSMENT AND PLAN:  1.  Peripheral arterial disease: The patient had previous left SFA angioplasty and stent placement with resolution of claudication. He continues to have right calf claudication.  Previous angiography showed only moderate right SFA stenosis.  Continue cilostazol for now.  I am going to repeat his ABI and lower extremity arterial duplex.   2. Tobacco use: He is determined to quit smoking and currently is using a patch.    3. Hyperlipidemia: Continue treatment with atorvastatin and Zetia with a target LDL of less than 70.  4. Coronary artery disease: Currently he has no symptoms suggestive of angina. Continue medical therapy.  5.  Essential hypertension: He reports a component of whitecoat syndrome.  Blood pressure is mildly elevated.  Continue same medications for now.  We can consider switching metoprolol to carvedilol.    Disposition:   FU with me in 6 months  I, Jesus Reyes am acting as a Education administrator for Kathlyn Sacramento, M.D.  I have reviewed the above documentation for accuracy and completeness, and I agree with the above.    Signed, Kathlyn Sacramento, MD 07/17/18 [place], [place]

## 2018-08-22 ENCOUNTER — Telehealth: Payer: Self-pay | Admitting: *Deleted

## 2018-08-22 NOTE — Telephone Encounter (Signed)
   Cardiac Questionnaire:    Since your last visit or hospitalization:    1. Have you been having new or worsening chest pain? NO   2. Have you been having new or worsening shortness of breath? No 3. Have you been having new or worsening leg swelling, wt gain, or increase in abdominal girth (pants fitting more tightly)?No   4. Have you had any passing out spells? No    *A YES to any of these questions would result in the appointment being kept. *If all the answers to these questions are NO, we should indicate that given the current situation regarding the worldwide coronarvirus pandemic, at the recommendation of the CDC, we are looking to limit gatherings in our waiting area, and thus will reschedule their appointment beyond four weeks from today.   _____________

## 2018-08-29 ENCOUNTER — Ambulatory Visit: Payer: BLUE CROSS/BLUE SHIELD | Admitting: Cardiology

## 2018-09-24 ENCOUNTER — Other Ambulatory Visit: Payer: Self-pay

## 2018-09-24 MED ORDER — CILOSTAZOL 50 MG PO TABS: 50.0000 mg | ORAL_TABLET | Freq: Two times a day (BID) | ORAL | 0 refills | Status: DC

## 2018-10-13 ENCOUNTER — Other Ambulatory Visit: Payer: Self-pay

## 2018-10-13 ENCOUNTER — Encounter (HOSPITAL_COMMUNITY): Payer: Self-pay | Admitting: Emergency Medicine

## 2018-10-13 ENCOUNTER — Emergency Department (HOSPITAL_COMMUNITY): Payer: BC Managed Care – PPO

## 2018-10-13 ENCOUNTER — Inpatient Hospital Stay (HOSPITAL_COMMUNITY)
Admission: EM | Admit: 2018-10-13 | Discharge: 2018-10-22 | DRG: 234 | Disposition: A | Discharge status: Home / Self Care | Payer: BC Managed Care – PPO | Attending: Thoracic Surgery (Cardiothoracic Vascular Surgery) | Admitting: Thoracic Surgery (Cardiothoracic Vascular Surgery)

## 2018-10-13 DIAGNOSIS — R079 Chest pain, unspecified: Secondary | ICD-10-CM | POA: Diagnosis not present

## 2018-10-13 DIAGNOSIS — Z955 Presence of coronary angioplasty implant and graft: Secondary | ICD-10-CM

## 2018-10-13 DIAGNOSIS — Z720 Tobacco use: Secondary | ICD-10-CM | POA: Diagnosis present

## 2018-10-13 DIAGNOSIS — F17210 Nicotine dependence, cigarettes, uncomplicated: Secondary | ICD-10-CM | POA: Diagnosis present

## 2018-10-13 DIAGNOSIS — I25110 Atherosclerotic heart disease of native coronary artery with unstable angina pectoris: Secondary | ICD-10-CM | POA: Diagnosis not present

## 2018-10-13 DIAGNOSIS — I129 Hypertensive chronic kidney disease with stage 1 through stage 4 chronic kidney disease, or unspecified chronic kidney disease: Secondary | ICD-10-CM | POA: Diagnosis present

## 2018-10-13 DIAGNOSIS — I252 Old myocardial infarction: Secondary | ICD-10-CM

## 2018-10-13 DIAGNOSIS — M06379 Rheumatoid nodule, unspecified ankle and foot: Secondary | ICD-10-CM

## 2018-10-13 DIAGNOSIS — Z20828 Contact with and (suspected) exposure to other viral communicable diseases: Secondary | ICD-10-CM | POA: Diagnosis present

## 2018-10-13 DIAGNOSIS — R197 Diarrhea, unspecified: Secondary | ICD-10-CM | POA: Diagnosis present

## 2018-10-13 DIAGNOSIS — I959 Hypotension, unspecified: Secondary | ICD-10-CM | POA: Diagnosis present

## 2018-10-13 DIAGNOSIS — I739 Peripheral vascular disease, unspecified: Secondary | ICD-10-CM | POA: Diagnosis present

## 2018-10-13 DIAGNOSIS — N183 Chronic kidney disease, stage 3 (moderate): Secondary | ICD-10-CM | POA: Diagnosis present

## 2018-10-13 DIAGNOSIS — D72829 Elevated white blood cell count, unspecified: Secondary | ICD-10-CM | POA: Diagnosis not present

## 2018-10-13 DIAGNOSIS — I10 Essential (primary) hypertension: Secondary | ICD-10-CM | POA: Diagnosis present

## 2018-10-13 DIAGNOSIS — Z8249 Family history of ischemic heart disease and other diseases of the circulatory system: Secondary | ICD-10-CM

## 2018-10-13 DIAGNOSIS — I2510 Atherosclerotic heart disease of native coronary artery without angina pectoris: Secondary | ICD-10-CM | POA: Diagnosis present

## 2018-10-13 DIAGNOSIS — Z09 Encounter for follow-up examination after completed treatment for conditions other than malignant neoplasm: Secondary | ICD-10-CM

## 2018-10-13 DIAGNOSIS — F1010 Alcohol abuse, uncomplicated: Secondary | ICD-10-CM | POA: Diagnosis present

## 2018-10-13 DIAGNOSIS — I200 Unstable angina: Secondary | ICD-10-CM | POA: Diagnosis present

## 2018-10-13 DIAGNOSIS — Z7982 Long term (current) use of aspirin: Secondary | ICD-10-CM

## 2018-10-13 DIAGNOSIS — Z8673 Personal history of transient ischemic attack (TIA), and cerebral infarction without residual deficits: Secondary | ICD-10-CM

## 2018-10-13 DIAGNOSIS — E872 Acidosis: Secondary | ICD-10-CM | POA: Diagnosis present

## 2018-10-13 DIAGNOSIS — D62 Acute posthemorrhagic anemia: Secondary | ICD-10-CM | POA: Diagnosis not present

## 2018-10-13 DIAGNOSIS — K219 Gastro-esophageal reflux disease without esophagitis: Secondary | ICD-10-CM | POA: Diagnosis present

## 2018-10-13 DIAGNOSIS — E785 Hyperlipidemia, unspecified: Secondary | ICD-10-CM | POA: Diagnosis present

## 2018-10-13 DIAGNOSIS — Z951 Presence of aortocoronary bypass graft: Secondary | ICD-10-CM

## 2018-10-13 HISTORY — DX: Fatty (change of) liver, not elsewhere classified: K76.0

## 2018-10-13 HISTORY — DX: Chronic kidney disease, stage 3 unspecified: N18.30

## 2018-10-13 LAB — BASIC METABOLIC PANEL
Anion gap: 13 (ref 5–15)
BUN: 13 mg/dL (ref 6–20)
CO2: 19 mmol/L — ABNORMAL LOW (ref 22–32)
Calcium: 9 mg/dL (ref 8.9–10.3)
Chloride: 103 mmol/L (ref 98–111)
Creatinine, Ser: 1.38 mg/dL — ABNORMAL HIGH (ref 0.61–1.24)
GFR calc Af Amer: >60 mL/min (ref >60)
GFR calc non Af Amer: 58 mL/min — ABNORMAL LOW (ref >60)
Glucose, Bld: 88 mg/dL (ref 70–99)
Potassium: 3.7 mmol/L (ref 3.5–5.1)
Sodium: 135 mmol/L (ref 135–145)

## 2018-10-13 LAB — CBC
HCT: 39.3 % (ref 39.0–52.0)
Hemoglobin: 13.2 g/dL (ref 13.0–17.0)
MCH: 31.6 pg (ref 26.0–34.0)
MCHC: 33.6 g/dL (ref 30.0–36.0)
MCV: 94 fL (ref 80.0–100.0)
Platelets: 235 10*3/uL (ref 150–400)
RBC: 4.18 MIL/uL — ABNORMAL LOW (ref 4.22–5.81)
RDW: 13.6 % (ref 11.5–15.5)
WBC: 7.4 10*3/uL (ref 4.0–10.5)
nRBC: 0 % (ref 0.0–0.2)

## 2018-10-13 LAB — TROPONIN I: Troponin I: <0.03 ng/mL (ref <0.03)

## 2018-10-13 MED ORDER — ASPIRIN 81 MG PO CHEW
324.0000 mg | CHEWABLE_TABLET | Freq: Once | ORAL | Status: AC
  Administered 2018-10-13: 324 mg via ORAL
  Filled 2018-10-13: qty 4

## 2018-10-13 MED ORDER — SODIUM CHLORIDE 0.9 % IV BOLUS
1000.0000 mL | Freq: Once | INTRAVENOUS | Status: AC
  Administered 2018-10-13: 1000 mL via INTRAVENOUS

## 2018-10-13 MED ORDER — ACETAMINOPHEN 325 MG PO TABS
650.0000 mg | ORAL_TABLET | Freq: Once | ORAL | Status: AC
  Administered 2018-10-13: 650 mg via ORAL
  Filled 2018-10-13: qty 2

## 2018-10-13 MED ORDER — SODIUM CHLORIDE 0.9 % IV BOLUS
500.0000 mL | Freq: Once | INTRAVENOUS | Status: AC
  Administered 2018-10-14: 500 mL via INTRAVENOUS

## 2018-10-13 MED ORDER — SODIUM CHLORIDE 0.9% FLUSH: 3.0000 mL | Freq: Once | INTRAVENOUS | Status: DC

## 2018-10-13 NOTE — ED Notes (Signed)
EDP made aware of patient's blood pressure 81/53, verbal order given for fluid bolus.

## 2018-10-13 NOTE — ED Notes (Signed)
edp to room  

## 2018-10-13 NOTE — ED Provider Notes (Signed)
[facility] EMERGENCY DEPARTMENT Provider Note   CSN: [id number] Arrival date & time: 10/13/18  2216    History   Chief Complaint Chief Complaint  Patient presents with  . Chest Pain    HPI Larry Pham is a 53 y.o. male with a history significant for CAD with MIs in 2013 and 2016 with stent placement x2, also with peripheral peripheral artery disease, stenting in the left lower extremity and is anticipating right lower extremity stenting presenting with a 30-minute episode of chest tightness along with shortness of breath which started at 830 this evening while he was sitting at a picnic.  He reports his symptoms remind him of prior MIs.  He took one nitroglycerin tablet and had complete resolution of his symptoms except for a mild headache which persists.  He denies palpitations, nausea, vomiting or diaphoresis.  He also denies abdominal or back pain.  He is currently chest pain symptom-free.     The history is provided by the patient.    Past Medical History:  Diagnosis Date  . CAD (coronary artery disease)    a. 2013 Inf STEMI s/p RCA DES; b. 04/2015 Cath: LM 40, LAD 40ost, 42m, D1 30, LCX 37m, OM1 50, RCA 30p, 20/61m, 50d. EF 55-65%.  . Carotid arterial disease ([place])    a. 07/2015 Carotid U/S: <50% bilat.  . Carpal tunnel syndrome   . GERD (gastroesophageal reflux disease)   . History of ETOH abuse   . Hyperlipidemia   . Hypertension   . MI (myocardial infarction) ([place]) 02-2012  . PAD (peripheral artery disease) ([place])    a. 06/2015 s/p L SFA stenting; b. 12/2016 PV Angio: R SFA 40-76m, L SFA patent stent-->Med Rx, cillostazol added.  . Seasonal allergies     Patient Active Problem List   Diagnosis Date Noted  . Chest pain 10/14/2018  . CKD (chronic kidney disease), stage II 02/18/2016  . ETOH abuse 02/18/2016  . Noncompliance 02/18/2016  . Chest pain on exertion 02/17/2016  . PAD (peripheral artery disease) ([place]) 06/23/2015  . Coronary artery disease involving  native coronary artery of native heart with angina pectoris ([place])   . Diverticulosis of colon without hemorrhage   . History of colonic polyps   . Screening for colon cancer 03/09/2015  . CAD (coronary artery disease), native coronary artery 06/19/2012  . 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 Tobacco use 06/19/2012  . History of alcohol abuse 06/19/2012  . Hyperlipidemia with target LDL less than 70 06/19/2012  . Essential hypertension 06/19/2012    Past Surgical History:  Procedure Laterality Date  . ABDOMINAL AORTOGRAM W/LOWER EXTREMITY N/A 01/04/2017   Procedure: ABDOMINAL AORTOGRAM W/LOWER EXTREMITY;  Surgeon: Wellington Hampshire, MD;  Location: [facility];  Service: Cardiovascular;  Laterality: N/A;  . CARDIAC CATHETERIZATION N/A 04/22/2015   Procedure: Left Heart Cath and Coronary Angiography;  Surgeon: Burnell Blanks, MD;  Location: [facility];  Service: Cardiovascular;  Laterality: N/A;  . CARDIAC CATHETERIZATION N/A 04/22/2015   Procedure: Intravascular Pressure Wire/FFR Study;  Surgeon: Burnell Blanks, MD;  Location: [facility];  Service: Cardiovascular;  Laterality: N/A;  . COLONOSCOPY WITH PROPOFOL N/A 03/23/2015   Procedure: COLONOSCOPY WITH PROPOFOL;  Surgeon: Daneil Dolin, MD;  Location: [facility];  Service: Endoscopy;  Laterality: N/A;  [place] time in 0849  time out  0959  total time 10 mintues  . CORONARY ANGIOPLASTY    . CORONARY ARTERY CATHETERIZATION WITH STENTING  N/A 02-2102 AND 05-2012  . LEFT FOREARM SURGERY     s/p machine accident  . LOWER EXTREMITY ANGIOGRAM Bilateral 07/01/2015   Procedure: Lower Extremity Angiogram;  Surgeon: Wellington Hampshire, MD;  Location: [facility];  Service: Cardiovascular;  Laterality: Bilateral;  . PERIPHERAL VASCULAR CATHETERIZATION N/A 07/01/2015   Procedure: Abdominal Aortogram;  Surgeon: Wellington Hampshire, MD;  Location: [place] [facility];  Service: Cardiovascular;  Laterality: N/A;   . PERIPHERAL VASCULAR CATHETERIZATION Left 07/01/2015   Procedure: Peripheral Vascular Intervention;  Surgeon: Wellington Hampshire, MD;  Location: [facility];  Service: Cardiovascular;  Laterality: Left;  SFA  . POLYPECTOMY N/A 03/23/2015   Procedure: POLYPECTOMY;  Surgeon: Daneil Dolin, MD;  Location: [facility];  Service: Endoscopy;  Laterality: N/A;  cecal, descending colon  . TRIGGER FINGER RELEASE     right 5th digit        Home Medications    Prior to Admission medications   Medication Sig Start Date End Date Taking? Authorizing Provider  albuterol (PROVENTIL HFA;VENTOLIN HFA) 108 (90 BASE) MCG/ACT inhaler Inhale 2 puffs into the lungs every 6 (six) hours as needed for wheezing or shortness of breath. 04/13/15   Arnoldo Lenis, MD  alfuzosin (UROXATRAL) 10 MG 24 hr tablet Take 10 mg by mouth daily.  05/08/15   [provider]  aspirin EC 81 MG tablet Take 81 mg by mouth daily.    [provider]  atorvastatin (LIPITOR) 80 MG tablet Take 80 mg by mouth daily.    [provider]  cilostazol (PLETAL) 50 MG tablet TAKE 1 TABLET BY MOUTH TWICE A DAY 12/28/17   Wellington Hampshire, MD  cilostazol (PLETAL) 50 MG tablet Take 1 tablet (50 mg total) by mouth 2 (two) times daily. 09/24/18   Wellington Hampshire, MD  ezetimibe (ZETIA) 10 MG tablet Take 1 tablet (10 mg total) by mouth daily. 01/12/18 04/12/18  Theora Gianotti, NP  gabapentin (NEURONTIN) 600 MG tablet Take 600 mg by mouth 3 (three) times daily.     [provider]  hydrochlorothiazide (HYDRODIURIL) 25 MG tablet Take 25 mg by mouth daily.    [provider]  lisinopril (PRINIVIL,ZESTRIL) 40 MG tablet Take 40 mg by mouth daily.    [provider]  metoprolol succinate (TOPROL-XL) 25 MG 24 hr tablet Take 25 mg by mouth daily. 05/15/16   [provider]  naproxen sodium (ANAPROX) 220 MG tablet Take 440 mg by mouth 2 (two) times daily as needed (for pain.).    [provider]  nitroGLYCERIN (NITROSTAT) 0.4 MG SL tablet PLACE 1 TABLET UNDER THE TONGUE & ALLOW TO DISSOLVE EVERY 5 MIN AS NEEDED FOR CHEST PAIN 02/16/18   Arnoldo Lenis, MD  pantoprazole (PROTONIX) 20 MG tablet Take 20 mg by mouth daily. 05/21/16   [provider]  ranitidine (ZANTAC) 150 MG tablet Take 1 tablet (150 mg total) by mouth 2 (two) times daily. 06/03/17   Orpah Greek, MD  sucralfate (CARAFATE) 1 GM/10ML suspension Take 10 mLs (1 g total) by mouth 4 (four) times daily -  with meals and at bedtime. 06/03/17   Orpah Greek, MD  SUMAtriptan (IMITREX) 50 MG tablet Take 1 tablet by mouth every 2 (two) hours as needed for migraine or headache.  02/02/15   [provider]  tiZANidine (ZANAFLEX) 4 MG tablet Take 4 mg by mouth daily.    [provider]  Vitamin  D, Ergocalciferol, (DRISDOL) 50000 UNITS CAPS capsule Take 1 capsule by mouth 2 (two) times a week. 02/02/15   [provider]    Family History Family History  Problem Relation Age of Onset  . Hypertension Mother   . Heart disease Mother   . Cirrhosis Father        DIED AT AGE 39  . Breast cancer Paternal Grandmother        DECEASED  . Colon cancer Neg Hx     Social History Social History   Tobacco Use  . Smoking status: Current Every Day Smoker    Packs/day: 0.25    Years: 30.00    Pack years: 7.50    Types: Cigarettes    Start date: 11/22/1977  . Smokeless tobacco: Never Used  . Tobacco comment: 15 cigarettes a day  Substance Use Topics  . Alcohol use: Yes    Alcohol/week: 0.0 standard drinks    Comment: occasionally  . Drug use: No     Allergies   Bee venom   Review of Systems Review of Systems  Constitutional: Negative for diaphoresis and fever.  HENT: Negative for congestion and sore throat.   Eyes: Negative.   Respiratory: Positive for shortness of breath.   Cardiovascular: Positive for chest pain.  Gastrointestinal: Negative for abdominal  pain, nausea and vomiting.  Genitourinary: Negative.   Musculoskeletal: Negative for arthralgias, joint swelling and neck pain.  Skin: Negative.  Negative for rash and wound.  Neurological: Positive for headaches. Negative for dizziness, weakness, light-headedness and numbness.  Psychiatric/Behavioral: Negative.      Physical Exam Updated Vital Signs BP (!) 91/52   Pulse 88   Temp 98.4 F (36.9 C) (Oral)   Resp 18   Ht 5\' 11"  (1.803 m)   Wt 111.1 kg   SpO2 96%   BMI 34.17 kg/m   Physical Exam Vitals signs and nursing note reviewed.  Constitutional:      General: He is not in acute distress.    Appearance: He is well-developed.  HENT:     Head: Normocephalic and atraumatic.  Eyes:     Conjunctiva/sclera: Conjunctivae normal.  Neck:     Musculoskeletal: Normal range of motion.  Cardiovascular:     Rate and Rhythm: Normal rate and regular rhythm.     Heart sounds: Normal heart sounds.  Pulmonary:     Effort: Pulmonary effort is normal.     Breath sounds: Normal breath sounds. No wheezing.  Abdominal:     General: Bowel sounds are normal.     Palpations: Abdomen is soft.     Tenderness: There is no abdominal tenderness.  Musculoskeletal: Normal range of motion.  Skin:    General: Skin is warm and dry.  Neurological:     Mental Status: He is alert.      ED Treatments / Results  Labs (all labs ordered are listed, but only abnormal results are displayed) Labs Reviewed  BASIC METABOLIC PANEL - Abnormal; Notable for the following components:      Result Value   CO2 19 (*)    Creatinine, Ser 1.38 (*)    GFR calc non Af Amer 58 (*)    All other components within normal limits  CBC - Abnormal; Notable for the following components:   RBC 4.18 (*)    All other components within normal limits  SARS CORONAVIRUS 2 (HOSPITAL ORDER, [facility])  TROPONIN I    EKG EKG Interpretation  Date/Time:  Saturday October 13 2018 22:25:18  EDT Ventricular Rate:  97 PR Interval:    QRS Duration: 85 QT Interval:  351 QTC Calculation: 446 R Axis:   71 Text Interpretation:  Sinus rhythm Borderline repolarization abnormality Nonspecific T wave abnormality Confirmed by Ezequiel Essex [phone]) on 10/13/2018 10:56:49 PM   Radiology Dg Chest 2 View  Result Date: 10/13/2018 CLINICAL DATA:  53 year old male with history of nonradiating chest pain that began 2 hours ago. EXAM: CHEST - 2 VIEW COMPARISON:  Chest x-ray 02/17/2016. FINDINGS: Lung volumes are normal. No consolidative airspace disease. No pleural effusions. No pneumothorax. No pulmonary nodule or mass noted. Pulmonary vasculature and the cardiomediastinal silhouette are within normal limits. Atherosclerosis in the thoracic aorta. IMPRESSION: 1.  No radiographic evidence of acute cardiopulmonary disease. 2. Aortic atherosclerosis. Electronically Signed   By: Vinnie Langton M.D.   On: 10/13/2018 23:39    Procedures Procedures (including critical care time)  Medications Ordered in ED Medications  sodium chloride flush (NS) 0.9 % injection 3 mL (3 mLs Intravenous Not Given 10/13/18 2258)  sodium chloride 0.9 % bolus 1,000 mL (0 mLs Intravenous Stopped 10/13/18 2356)  aspirin chewable tablet 324 mg (324 mg Oral Given 10/13/18 2316)  acetaminophen (TYLENOL) tablet 650 mg (650 mg Oral Given 10/13/18 2317)  sodium chloride 0.9 % bolus 500 mL (500 mLs Intravenous New Bag/Given 10/14/18 0002)     Initial Impression / Assessment and Plan / ED Course  I have reviewed the triage vital signs and the nursing notes.  Pertinent labs & imaging results that were available during my care of the patient were reviewed by me and considered in my medical decision making (see chart for details).        Pt with h/o CAD, MI with stents x 2 2013 and 2016 with 30 minute episode of cp and sob this evening.  Pain free after ntg x 1.  Initial troponin negative, ekg with nonspecific t wave changes.  Will  require admission to rule out unstable angina.  Pt seen by Dr Wyvonnia Dusky. Discussed with Dr Maudie Mercury for admission.    Final Clinical Impressions(s) / ED Diagnoses   Final diagnoses:  Chest pain, unspecified type    ED Discharge Orders    None       Landis Martins 10/14/18 0030    Ezequiel Essex, MD 10/14/18 [phone]

## 2018-10-13 NOTE — ED Triage Notes (Signed)
Patient c/o central, non-radiating chest pain that started 2 hours ago while sitting at a cookout.. Patient reports shortness of breath and generalized weakness. Patient has hx of MI with cardiac stents. Patient took 1 Sl nitro 1 hour ago with some relief. Patient now reports headache.

## 2018-10-14 ENCOUNTER — Observation Stay (HOSPITAL_COMMUNITY): Payer: BC Managed Care – PPO

## 2018-10-14 ENCOUNTER — Encounter (HOSPITAL_COMMUNITY): Payer: Self-pay | Admitting: Internal Medicine

## 2018-10-14 ENCOUNTER — Observation Stay (HOSPITAL_BASED_OUTPATIENT_CLINIC_OR_DEPARTMENT_OTHER): Payer: BC Managed Care – PPO

## 2018-10-14 ENCOUNTER — Emergency Department (HOSPITAL_COMMUNITY): Payer: BC Managed Care – PPO

## 2018-10-14 DIAGNOSIS — R197 Diarrhea, unspecified: Secondary | ICD-10-CM | POA: Diagnosis not present

## 2018-10-14 DIAGNOSIS — F17210 Nicotine dependence, cigarettes, uncomplicated: Secondary | ICD-10-CM | POA: Diagnosis not present

## 2018-10-14 DIAGNOSIS — I200 Unstable angina: Secondary | ICD-10-CM

## 2018-10-14 DIAGNOSIS — F1010 Alcohol abuse, uncomplicated: Secondary | ICD-10-CM | POA: Diagnosis not present

## 2018-10-14 DIAGNOSIS — I2510 Atherosclerotic heart disease of native coronary artery without angina pectoris: Secondary | ICD-10-CM | POA: Diagnosis not present

## 2018-10-14 DIAGNOSIS — K219 Gastro-esophageal reflux disease without esophagitis: Secondary | ICD-10-CM | POA: Diagnosis not present

## 2018-10-14 DIAGNOSIS — Z20828 Contact with and (suspected) exposure to other viral communicable diseases: Secondary | ICD-10-CM | POA: Diagnosis not present

## 2018-10-14 DIAGNOSIS — Z8673 Personal history of transient ischemic attack (TIA), and cerebral infarction without residual deficits: Secondary | ICD-10-CM | POA: Diagnosis not present

## 2018-10-14 DIAGNOSIS — I129 Hypertensive chronic kidney disease with stage 1 through stage 4 chronic kidney disease, or unspecified chronic kidney disease: Secondary | ICD-10-CM | POA: Diagnosis not present

## 2018-10-14 DIAGNOSIS — I25110 Atherosclerotic heart disease of native coronary artery with unstable angina pectoris: Secondary | ICD-10-CM | POA: Diagnosis not present

## 2018-10-14 DIAGNOSIS — R079 Chest pain, unspecified: Secondary | ICD-10-CM | POA: Diagnosis not present

## 2018-10-14 DIAGNOSIS — Z955 Presence of coronary angioplasty implant and graft: Secondary | ICD-10-CM | POA: Diagnosis not present

## 2018-10-14 DIAGNOSIS — I959 Hypotension, unspecified: Secondary | ICD-10-CM | POA: Diagnosis not present

## 2018-10-14 DIAGNOSIS — Z7982 Long term (current) use of aspirin: Secondary | ICD-10-CM | POA: Diagnosis not present

## 2018-10-14 DIAGNOSIS — I739 Peripheral vascular disease, unspecified: Secondary | ICD-10-CM | POA: Diagnosis not present

## 2018-10-14 DIAGNOSIS — N183 Chronic kidney disease, stage 3 (moderate): Secondary | ICD-10-CM | POA: Diagnosis not present

## 2018-10-14 DIAGNOSIS — E785 Hyperlipidemia, unspecified: Secondary | ICD-10-CM | POA: Diagnosis not present

## 2018-10-14 DIAGNOSIS — D62 Acute posthemorrhagic anemia: Secondary | ICD-10-CM | POA: Diagnosis not present

## 2018-10-14 DIAGNOSIS — Z8249 Family history of ischemic heart disease and other diseases of the circulatory system: Secondary | ICD-10-CM | POA: Diagnosis not present

## 2018-10-14 DIAGNOSIS — E872 Acidosis: Secondary | ICD-10-CM | POA: Diagnosis not present

## 2018-10-14 DIAGNOSIS — D72829 Elevated white blood cell count, unspecified: Secondary | ICD-10-CM | POA: Diagnosis not present

## 2018-10-14 DIAGNOSIS — I252 Old myocardial infarction: Secondary | ICD-10-CM | POA: Diagnosis not present

## 2018-10-14 LAB — ECHOCARDIOGRAM COMPLETE
Height: 71.5 in
Weight: 3908.8 oz

## 2018-10-14 LAB — TROPONIN I
Troponin I: <0.03 ng/mL (ref <0.03)
Troponin I: <0.03 ng/mL (ref <0.03)

## 2018-10-14 LAB — HEPARIN LEVEL (UNFRACTIONATED)
Heparin Unfractionated: 0.29 IU/mL — ABNORMAL LOW (ref 0.30–0.70)
Heparin Unfractionated: 0.31 IU/mL (ref 0.30–0.70)

## 2018-10-14 LAB — SARS CORONAVIRUS 2 BY RT PCR (HOSPITAL ORDER, PERFORMED IN ~~LOC~~ HOSPITAL LAB): SARS Coronavirus 2: NEGATIVE

## 2018-10-14 LAB — PROTIME-INR
INR: 1.1 (ref 0.8–1.2)
Prothrombin Time: 13.9 seconds (ref 11.4–15.2)

## 2018-10-14 LAB — APTT: aPTT: 29 seconds (ref 24–36)

## 2018-10-14 MED ORDER — LORAZEPAM 1 MG PO TABS
0.0000 mg | ORAL_TABLET | Freq: Four times a day (QID) | ORAL | Status: AC
  Administered 2018-10-15 – 2018-10-16 (×2): 2 mg via ORAL
  Filled 2018-10-14 (×2): qty 2
  Filled 2018-10-14: qty 1

## 2018-10-14 MED ORDER — VITAMIN B-1 100 MG PO TABS
100.0000 mg | ORAL_TABLET | Freq: Every day | ORAL | Status: DC
  Administered 2018-10-14 – 2018-10-21 (×7): 100 mg via ORAL
  Filled 2018-10-14 (×8): qty 1

## 2018-10-14 MED ORDER — SODIUM CHLORIDE 0.9% FLUSH: 3.0000 mL | INTRAVENOUS | Status: DC | PRN

## 2018-10-14 MED ORDER — ATORVASTATIN CALCIUM 80 MG PO TABS
80.0000 mg | ORAL_TABLET | Freq: Every day | ORAL | Status: DC
  Administered 2018-10-14 – 2018-10-22 (×8): 80 mg via ORAL
  Filled 2018-10-14 (×9): qty 1

## 2018-10-14 MED ORDER — ADULT MULTIVITAMIN W/MINERALS CH
1.0000 | ORAL_TABLET | Freq: Every day | ORAL | Status: DC
  Administered 2018-10-14 – 2018-10-16 (×3): 1 via ORAL
  Filled 2018-10-14 (×3): qty 1

## 2018-10-14 MED ORDER — HEPARIN BOLUS VIA INFUSION
4000.0000 [IU] | Freq: Once | INTRAVENOUS | Status: AC
  Administered 2018-10-14: 4000 [IU] via INTRAVENOUS
  Filled 2018-10-14: qty 4000

## 2018-10-14 MED ORDER — IOHEXOL 350 MG/ML SOLN
100.0000 mL | Freq: Once | INTRAVENOUS | Status: AC | PRN
  Administered 2018-10-14: 100 mL via INTRAVENOUS

## 2018-10-14 MED ORDER — SODIUM CHLORIDE 0.9 % WEIGHT BASED INFUSION: 1.0000 mL/kg/h | INTRAVENOUS | Status: DC

## 2018-10-14 MED ORDER — THIAMINE HCL 100 MG/ML IJ SOLN
100.0000 mg | Freq: Every day | INTRAMUSCULAR | Status: DC
  Administered 2018-10-22: 100 mg via INTRAVENOUS
  Filled 2018-10-14 (×3): qty 2

## 2018-10-14 MED ORDER — HEPARIN BOLUS VIA INFUSION
1500.0000 [IU] | Freq: Once | INTRAVENOUS | Status: AC
  Administered 2018-10-14: 1500 [IU] via INTRAVENOUS
  Filled 2018-10-14: qty 1500

## 2018-10-14 MED ORDER — SODIUM CHLORIDE 0.9 % IV BOLUS: 500.0000 mL | Freq: Once | INTRAVENOUS | Status: DC

## 2018-10-14 MED ORDER — CILOSTAZOL 100 MG PO TABS
50.0000 mg | ORAL_TABLET | Freq: Two times a day (BID) | ORAL | Status: DC
  Administered 2018-10-14 – 2018-10-16 (×6): 50 mg via ORAL
  Filled 2018-10-14 (×6): qty 1

## 2018-10-14 MED ORDER — PERFLUTREN LIPID MICROSPHERE
1.0000 mL | INTRAVENOUS | Status: AC | PRN
  Administered 2018-10-14: 2 mL via INTRAVENOUS
  Filled 2018-10-14: qty 10

## 2018-10-14 MED ORDER — PANTOPRAZOLE SODIUM 20 MG PO TBEC
20.0000 mg | DELAYED_RELEASE_TABLET | Freq: Every day | ORAL | Status: DC
  Administered 2018-10-14 – 2018-10-16 (×3): 20 mg via ORAL
  Filled 2018-10-14 (×3): qty 1

## 2018-10-14 MED ORDER — EZETIMIBE 10 MG PO TABS
10.0000 mg | ORAL_TABLET | Freq: Every day | ORAL | Status: DC
  Administered 2018-10-14 – 2018-10-22 (×8): 10 mg via ORAL
  Filled 2018-10-14 (×8): qty 1

## 2018-10-14 MED ORDER — SODIUM CHLORIDE 0.9% FLUSH
3.0000 mL | Freq: Two times a day (BID) | INTRAVENOUS | Status: DC
  Administered 2018-10-16: 3 mL via INTRAVENOUS

## 2018-10-14 MED ORDER — LORAZEPAM 1 MG PO TABS
0.0000 mg | ORAL_TABLET | Freq: Two times a day (BID) | ORAL | Status: DC
  Administered 2018-10-16 (×2): 2 mg via ORAL
  Filled 2018-10-14 (×2): qty 2

## 2018-10-14 MED ORDER — ASPIRIN 81 MG PO CHEW
81.0000 mg | CHEWABLE_TABLET | ORAL | Status: AC
  Administered 2018-10-15: 81 mg via ORAL
  Filled 2018-10-14: qty 1

## 2018-10-14 MED ORDER — ASPIRIN EC 81 MG PO TBEC
81.0000 mg | DELAYED_RELEASE_TABLET | Freq: Every day | ORAL | Status: DC
  Administered 2018-10-14: 81 mg via ORAL
  Filled 2018-10-14 (×2): qty 1

## 2018-10-14 MED ORDER — LOPERAMIDE HCL 2 MG PO CAPS: 2.0000 mg | ORAL_CAPSULE | Freq: Every day | ORAL | Status: DC | PRN

## 2018-10-14 MED ORDER — LORAZEPAM 1 MG PO TABS: 1.0000 mg | ORAL_TABLET | Freq: Four times a day (QID) | ORAL | Status: DC | PRN

## 2018-10-14 MED ORDER — SODIUM CHLORIDE 0.9 % IV SOLN: 250.0000 mL | INTRAVENOUS | Status: DC | PRN

## 2018-10-14 MED ORDER — SODIUM CHLORIDE 0.9 % IV SOLN
INTRAVENOUS | Status: AC
  Administered 2018-10-14: 04:00:00 via INTRAVENOUS

## 2018-10-14 MED ORDER — HEPARIN (PORCINE) 25000 UT/250ML-% IV SOLN
1500.0000 [IU]/h | INTRAVENOUS | Status: DC
  Administered 2018-10-14: 1250 [IU]/h via INTRAVENOUS
  Filled 2018-10-14 (×3): qty 250

## 2018-10-14 MED ORDER — METOPROLOL SUCCINATE ER 25 MG PO TB24
25.0000 mg | ORAL_TABLET | Freq: Every day | ORAL | Status: DC
  Administered 2018-10-14 – 2018-10-15 (×2): 25 mg via ORAL
  Filled 2018-10-14 (×2): qty 1

## 2018-10-14 MED ORDER — FOLIC ACID 1 MG PO TABS
1.0000 mg | ORAL_TABLET | Freq: Every day | ORAL | Status: DC
  Administered 2018-10-14 – 2018-10-22 (×8): 1 mg via ORAL
  Filled 2018-10-14 (×8): qty 1

## 2018-10-14 MED ORDER — LORAZEPAM 2 MG/ML IJ SOLN: 1.0000 mg | Freq: Four times a day (QID) | INTRAMUSCULAR | Status: DC | PRN

## 2018-10-14 MED ORDER — SODIUM CHLORIDE 0.9 % WEIGHT BASED INFUSION: 3.0000 mL/kg/h | INTRAVENOUS | Status: DC

## 2018-10-14 NOTE — Consult Note (Addendum)
n  Cardiology Consultation:   Patient ID: Larry Pham; [id number]; 1965-07-14   Admit date: 10/13/2018 Date of Consult: 10/14/2018  Primary Care Provider: Barry Dienes, NP Primary Cardiologist: Carlyle Dolly, MD  Primary PV:  Dr. Fletcher Anon  Chief Complaint: chest pain  Patient Profile:   Larry Pham is a 53 y.o. male with a hx of CAD (inferior STEMI 2013 s/p PCI to RCA, DES to RCA 2014, last cath 2016 managed medically), LE PAD (s/p prior occurrences of PTA, followed by Dr. 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 Maudie Mercury.  History of Present Illness:   Per Dr. Nelly Laurence note, he had prior PCI to RCA in [place], [place] in setting of inferior STEMI. He continued to have symptoms, seen at [facility]. [facility] MRI showed small RCA infarct with no active ischemic defects. He had Jan 2014 cath at [facility] with PCI to RCA with DES, left main disease described as 40%. In 04/2015 he underwent cath showing moderate LM disease, patent RCA stents, mod LAD disease, severe stneosis small diastal LCX too small for PCI. Imdur has previously made CP worse. Last echo in 2017 showed moderate LVH, EF 76-19%, normal diastolic function, mild LAE.  He was admitted with chest discomfort concerning for unstable angina. For the past 2 weeks he has noticed central chest pain when he exerts himself with walking distances, relieved with rest after about 1 minute. He was sitting down at a cookout yesterday and developed recurrence of pain at rest. It was sharp in nature but reminded him of prior angina. He took 1 SL NTG shortly after onset of discomfort and pain was relieved. However, he developed a headache and lightheadedness so came to the ER. He had also reported diarrhea for the past 2 days, 3-4 loose stools per day. His last episode of this was Friday 6/5. His blood  pressure was low on arrival with a nadir of 74/40 so he was treated with IV fluid bolus with last BP 124/75. His HCTZ and lisinopril have been held. He has a GI panel and C diff pending, Covid19 negative. CT without evidence for dissection, + hepatic steatosis. Troponin is negative x1. He reports continuing to smoke and drinks 6-7 beers per day. He is pain free. EKG shows nonspecific TW changes accentuated from prior. Tele unremarkable.  Past Medical History:  Diagnosis Date  . CAD (coronary artery disease)    a. 2013 Inf STEMI s/p RCA DES. b. DES to RCA 2014 at [facility]. c. 04/2015 Cath: LM 40, LAD 40ost, 28m, D1 30, LCX 56m, OM1 50, RCA 30p, 20/13m, 50d. EF 55-65%.  . Carotid arterial disease ([place])    a. 07/2015 Carotid U/S: <50% bilat.  . Carpal tunnel syndrome   . CKD (chronic kidney disease), stage III ([place])   . CVA (cerebral vascular accident) ([place]) 12/09/2014   MRI brain=> remote infarcts seen in pons and left thalamus   . GERD (gastroesophageal reflux disease)   . Hepatic steatosis   . History of ETOH abuse   . Hyperlipidemia   . Hypertension   . MI (myocardial infarction) ([place]) 02-2012  . PAD (peripheral artery disease) ([place])    a. 06/2015 s/p L SFA stenting; b. 12/2016 PV Angio: R SFA 40-73m, L SFA patent stent-->Med Rx, cillostazol added.  . Seasonal allergies     Past  Surgical History:  Procedure Laterality Date  . ABDOMINAL AORTOGRAM W/LOWER EXTREMITY N/A 01/04/2017   Procedure: ABDOMINAL AORTOGRAM W/LOWER EXTREMITY;  Surgeon: Wellington Hampshire, MD;  Location: [place] CV [facility];  Service: Cardiovascular;  Laterality: N/A;  . CARDIAC CATHETERIZATION N/A 04/22/2015   Procedure: Left Heart Cath and Coronary Angiography;  Surgeon: Burnell Blanks, MD;  Location: [place] [facility];  Service: Cardiovascular;  Laterality: N/A;  . CARDIAC CATHETERIZATION N/A 04/22/2015   Procedure: Intravascular Pressure Wire/FFR Study;  Surgeon: Burnell Blanks, MD;  Location: [place]  CV LAB;  Service: Cardiovascular;  Laterality: N/A;  . COLONOSCOPY WITH PROPOFOL N/A 03/23/2015   Procedure: COLONOSCOPY WITH PROPOFOL;  Surgeon: Daneil Dolin, MD;  Location: [facility];  Service: Endoscopy;  Laterality: N/A;  [place] time in 0849  time out  0959  total time 10 mintues  . CORONARY ANGIOPLASTY    . CORONARY ARTERY CATHETERIZATION WITH STENTING N/A 02-2102 AND 05-2012  . LEFT FOREARM SURGERY     s/p machine accident  . LOWER EXTREMITY ANGIOGRAM Bilateral 07/01/2015   Procedure: Lower Extremity Angiogram;  Surgeon: Wellington Hampshire, MD;  Location: [place] CV [facility];  Service: Cardiovascular;  Laterality: Bilateral;  . PERIPHERAL VASCULAR CATHETERIZATION N/A 07/01/2015   Procedure: Abdominal Aortogram;  Surgeon: Wellington Hampshire, MD;  Location: [place] CV LAB;  Service: Cardiovascular;  Laterality: N/A;  . PERIPHERAL VASCULAR CATHETERIZATION Left 07/01/2015   Procedure: Peripheral Vascular Intervention;  Surgeon: Wellington Hampshire, MD;  Location: [place] CV [facility];  Service: Cardiovascular;  Laterality: Left;  SFA  . POLYPECTOMY N/A 03/23/2015   Procedure: POLYPECTOMY;  Surgeon: Daneil Dolin, MD;  Location: [facility];  Service: Endoscopy;  Laterality: N/A;  cecal, descending colon  . TRIGGER FINGER RELEASE     right 5th digit     Inpatient Medications: Scheduled Meds: . aspirin EC  81 mg Oral Daily  . atorvastatin  80 mg Oral Daily  . cilostazol  50 mg Oral BID  . ezetimibe  10 mg Oral Daily  . metoprolol succinate  25 mg Oral Daily  . pantoprazole  20 mg Oral Daily  . sodium chloride flush  3 mL Intravenous Once   Continuous Infusions: . sodium chloride 75 mL/hr at 10/14/18 0700  . heparin 1,250 Units/hr (10/14/18 0700)  . sodium chloride Stopped (10/14/18 0235)   PRN Meds: loperamide  Home Meds: Prior to Admission medications   Medication Sig Start Date End Date Taking? Authorizing Provider  albuterol (PROVENTIL HFA;VENTOLIN HFA) 108 (90 BASE) MCG/ACT inhaler Inhale  2 puffs into the lungs every 6 (six) hours as needed for wheezing or shortness of breath. 04/13/15   Arnoldo Lenis, MD  alfuzosin (UROXATRAL) 10 MG 24 hr tablet Take 10 mg by mouth daily.  05/08/15   [provider]  aspirin EC 81 MG tablet Take 81 mg by mouth daily.    [provider]  atorvastatin (LIPITOR) 80 MG tablet Take 80 mg by mouth daily.    [provider]  cilostazol (PLETAL) 50 MG tablet TAKE 1 TABLET BY MOUTH TWICE A DAY 12/28/17   Wellington Hampshire, MD  cilostazol (PLETAL) 50 MG tablet Take 1 tablet (50 mg total) by mouth 2 (two) times daily. 09/24/18   Wellington Hampshire, MD  ezetimibe (ZETIA) 10 MG tablet Take 1 tablet (10 mg total) by mouth daily. 01/12/18 04/12/18  Theora Gianotti, NP  gabapentin (NEURONTIN) 600 MG tablet Take 600 mg by mouth  3 (three) times daily.     [provider]  hydrochlorothiazide (HYDRODIURIL) 25 MG tablet Take 25 mg by mouth daily.    [provider]  lisinopril (PRINIVIL,ZESTRIL) 40 MG tablet Take 40 mg by mouth daily.    [provider]  metoprolol succinate (TOPROL-XL) 25 MG 24 hr tablet Take 25 mg by mouth daily. 05/15/16   [provider]  naproxen sodium (ANAPROX) 220 MG tablet Take 440 mg by mouth 2 (two) times daily as needed (for pain.).    [provider]  nitroGLYCERIN (NITROSTAT) 0.4 MG SL tablet PLACE 1 TABLET UNDER THE TONGUE & ALLOW TO DISSOLVE EVERY 5 MIN AS NEEDED FOR CHEST PAIN 02/16/18   Arnoldo Lenis, MD  pantoprazole (PROTONIX) 20 MG tablet Take 20 mg by mouth daily. 05/21/16   [provider]  ranitidine (ZANTAC) 150 MG tablet Take 1 tablet (150 mg total) by mouth 2 (two) times daily. 06/03/17   Orpah Greek, MD  sucralfate (CARAFATE) 1 GM/10ML suspension Take 10 mLs (1 g total) by mouth 4 (four) times daily -  with meals and at bedtime. 06/03/17   Orpah Greek, MD  SUMAtriptan (IMITREX) 50 MG tablet Take 1 tablet by mouth  every 2 (two) hours as needed for migraine or headache.  02/02/15   [provider]  tiZANidine (ZANAFLEX) 4 MG tablet Take 4 mg by mouth daily.    [provider]  Vitamin D, Ergocalciferol, (DRISDOL) 50000 UNITS CAPS capsule Take 1 capsule by mouth 2 (two) times a week. 02/02/15   [provider]    Allergies:    Allergies  Allergen Reactions  . Imdur [Isosorbide Nitrate]     Made CP worse  . Bee Venom Swelling    Social History:   Social History   Socioeconomic History  . Marital status: Legally Separated    Spouse name: Not on file  . Number of children: Not on file  . Years of education: Not on file  . Highest education level: Not on file  Occupational History  . Not on file  Social Needs  . Financial resource strain: Not on file  . Food insecurity:    Worry: Not on file    Inability: Not on file  . Transportation needs:    Medical: Not on file    Non-medical: Not on file  Tobacco Use  . Smoking status: Current Every Day Smoker    Packs/day: 0.25    Years: 30.00    Pack years: 7.50    Types: Cigarettes    Start date: 11/22/1977  . Smokeless tobacco: Never Used  . Tobacco comment: 15 cigarettes a day  Substance and Sexual Activity  . Alcohol use: Yes    Alcohol/week: 0.0 standard drinks    Comment: occasionally  . Drug use: No  . Sexual activity: Yes  Lifestyle  . Physical activity:    Days per week: Not on file    Minutes per session: Not on file  . Stress: Not on file  Relationships  . Social connections:    Talks on phone: Not on file    Gets together: Not on file    Attends religious service: Not on file    Active member of club or organization: Not on file    Attends meetings of clubs or organizations: Not on file    Relationship status: Not on file  . Intimate partner violence:    Fear of current or ex partner: Not  on file    Emotionally abused: Not on file    Physically abused: Not on file    Forced sexual activity:  Not on file  Other Topics Concern  . Not on file  Social History Narrative  . Not on file    Family History:   The patient's family history includes Breast cancer in his paternal grandmother; Cirrhosis in his father; Heart disease in his mother; Hypertension in his mother. There is no history of Colon cancer.  ROS:  Please see the history of present illness.  All other ROS reviewed and negative.     Physical Exam/Data:   Vitals:   10/14/18 0119 10/14/18 0120 10/14/18 0225 10/14/18 0309  BP:  109/66 119/68 124/75  Pulse: 80  83 80  Resp: 18  19   Temp:    (!) 97.5 F (36.4 C)  TempSrc:    Oral  SpO2: 95%  98% 97%  Weight:    110.8 kg  Height:    5' 11.5" (1.816 m)    Intake/Output Summary (Last 24 hours) at 10/14/2018 0846 Last data filed at 10/14/2018 0700 Gross per 24 hour  Intake 317.98 ml  Output -  Net 317.98 ml   Last 3 Weights 10/14/2018 10/13/2018 07/17/2018  Weight (lbs) 244 lb 4.8 oz 245 lb 239 lb  Weight (kg) 110.814 kg 111.131 kg 108.41 kg    Body mass index is 33.6 kg/m.  General: Well developed, well nourished AAM in no acute distress. Head: Normocephalic, atraumatic, sclera non-icteric, no xanthomas, nares are without discharge.  Neck: Negative for carotid bruits. JVD not elevated. Lungs: Clear bilaterally to auscultation without wheezes, rales, or rhonchi. Breathing is unlabored. Heart: RRR with S1 S2. No murmurs, rubs, or gallops appreciated. Abdomen: Soft, non-tender, non-distended with normoactive bowel sounds. No hepatomegaly. No rebound/guarding. No obvious abdominal masses. Msk:  Strength and tone appear normal for age. Extremities: No clubbing or cyanosis. No edema.  Distal pedal pulses are 2+ and equal bilaterally. Left foot has semi solid nodule on L ankle surface (definitely asymmetric from R) Neuro: Alert and oriented X 3. No facial asymmetry. No focal deficit. Moves all extremities spontaneously. Psych:  Responds to questions appropriately with a  normal affect.  EKG:  The EKG was personally reviewed and demonstrates NSR 97bpm, borderline repolarization abnormality with TWI II, III, avF, V6, nonspecific TW change V5. The inferior TW changes are more accentuated from prior- previously nonspecific changes in these leads particularly moreso in lead III.  Relevant CV Studies: Most recent pertinent cardiac studies are outlined above.  Laboratory Data:  Chemistry Recent Labs  Lab 10/13/18 2233  NA 135  K 3.7  CL 103  CO2 19*  GLUCOSE 88  BUN 13  CREATININE 1.38*  CALCIUM 9.0  GFRNONAA 58*  GFRAA >60  ANIONGAP 13     Hematology Recent Labs  Lab 10/13/18 2233  WBC 7.4  RBC 4.18*  HGB 13.2  HCT 39.3  MCV 94.0  MCH 31.6  MCHC 33.6  RDW 13.6  PLT 235   Cardiac Enzymes Recent Labs  Lab 10/13/18 2233  TROPONINI <0.03      Radiology/Studies:  Dg Chest 2 View  Result Date: 10/13/2018 CLINICAL DATA:  53 year old male with history of nonradiating chest pain that began 2 hours ago. EXAM: CHEST - 2 VIEW COMPARISON:  Chest x-ray 02/17/2016. FINDINGS: Lung volumes are normal. No consolidative airspace disease. No pleural effusions. No pneumothorax. No pulmonary nodule or mass noted. Pulmonary vasculature and the cardiomediastinal  silhouette are within normal limits. Atherosclerosis in the thoracic aorta. IMPRESSION: 1.  No radiographic evidence of acute cardiopulmonary disease. 2. Aortic atherosclerosis. Electronically Signed   By: Vinnie Langton M.D.   On: 10/13/2018 23:39   Ct Head Wo Contrast  Result Date: 10/14/2018 CLINICAL DATA:  53 year old male with history of generalized weakness. Chest pain. EXAM: CT HEAD WITHOUT CONTRAST TECHNIQUE: Contiguous axial images were obtained from the base of the skull through the vertex without intravenous contrast. COMPARISON:  Head CT 09/18/2015. FINDINGS: Brain: No evidence of acute infarction, hemorrhage, hydrocephalus, extra-axial collection or mass lesion/mass effect. Vascular: No  hyperdense vessel or unexpected calcification. Skull: Normal. Negative for fracture or focal lesion. Sinuses/Orbits: No acute finding. Other: None. IMPRESSION: 1. No acute intracranial abnormalities. The appearance of the brain is normal for age. Electronically Signed   By: Vinnie Langton M.D.   On: 10/14/2018 02:12   Ct Angio Chest/abd/pel For Dissection W And/or Wo Contrast  Result Date: 10/14/2018 CLINICAL DATA:  53 year old male with history of nonradiating chest pain for the past 2 hours. Shortness of breath. Generalized weakness. EXAM: CT ANGIOGRAPHY CHEST, ABDOMEN AND PELVIS TECHNIQUE: Multidetector CT imaging through the chest, abdomen and pelvis was performed using the standard protocol during bolus administration of intravenous contrast. Multiplanar reconstructed images and MIPs were obtained and reviewed to evaluate the vascular anatomy. CONTRAST:  111mL OMNIPAQUE IOHEXOL 350 MG/ML SOLN COMPARISON:  CTA of the chest, abdomen and pelvis 06/03/2017. FINDINGS: CTA CHEST FINDINGS Cardiovascular: Heart size is normal. There is no significant pericardial fluid, thickening or pericardial calcification. There is aortic atherosclerosis, as well as atherosclerosis of the great vessels of the mediastinum and the coronary arteries, including calcified atherosclerotic plaque in the left main, left anterior descending, left circumflex and right coronary arteries. No evidence of thoracic aortic aneurysm or dissection. Noncontrast images demonstrate no crescentic high attenuation associated with the wall of the thoracic aorta to suggest acute intramural hemorrhage. Mediastinum/Nodes: No pathologically enlarged mediastinal or hilar lymph nodes. Densely calcified subcarinal lymph nodes incidentally noted. Esophagus is unremarkable in appearance. No axillary lymphadenopathy. Lungs/Pleura: Calcified granuloma in the lateral segment of the right middle lobe again noted. No other suspicious appearing pulmonary nodules or  masses are noted. No acute consolidative airspace disease. No pleural effusions. Musculoskeletal: There are no aggressive appearing lytic or blastic lesions noted in the visualized portions of the skeleton. Review of the MIP images confirms the above findings. CTA ABDOMEN AND PELVIS FINDINGS VASCULAR Aorta: Normal caliber aorta without aneurysm, dissection, vasculitis or significant stenosis. Celiac: Patent without evidence of aneurysm, dissection, vasculitis or significant stenosis. SMA: Patent without evidence of aneurysm, dissection, vasculitis or significant stenosis. Renals: Both renal arteries are patent without evidence of aneurysm, dissection, vasculitis, fibromuscular dysplasia or significant stenosis. IMA: Patent without evidence of aneurysm, dissection, vasculitis or significant stenosis. Inflow: Patent without evidence of aneurysm, dissection, vasculitis or significant stenosis. Veins: No obvious venous abnormality within the limitations of this arterial phase study. Review of the MIP images confirms the above findings. NON-VASCULAR Hepatobiliary: Severe diffuse low attenuation throughout the hepatic parenchyma, indicative of hepatic steatosis. No suspicious cystic or solid hepatic lesions. No intra or extrahepatic biliary ductal dilatation. Gallbladder is normal in appearance. Pancreas: No pancreatic mass. No pancreatic ductal dilatation. No pancreatic or peripancreatic fluid or inflammatory changes. Spleen: Unremarkable. Adrenals/Urinary Tract: Bilateral kidneys and bilateral adrenal glands are normal in appearance. No hydroureteronephrosis. Urinary bladder wall is diffusely thickened. Stomach/Bowel: Normal appearance of the stomach. No pathologic dilatation of small bowel  or colon. Normal appendix. Lymphatic: Aortic atherosclerosis, without evidence of aneurysm or dissection in the abdominal or pelvic vasculature. No lymphadenopathy noted in the abdomen or pelvis. Reproductive: Severe median lobe  hypertrophy of the prostate gland again noted. Seminal vesicles are unremarkable in appearance. Other: No significant volume of ascites.  No pneumoperitoneum. Musculoskeletal: There are no aggressive appearing lytic or blastic lesions noted in the visualized portions of the skeleton. Review of the MIP images confirms the above findings. IMPRESSION: 1. No findings to suggest acute aortic syndrome. No acute findings noted in the chest, abdomen or pelvis to account for the patient's symptoms. 2. Aortic atherosclerosis, in addition to left main and 3 vessel coronary artery disease. Please note that although the presence of coronary artery calcium documents the presence of coronary artery disease, the severity of this disease and any potential stenosis cannot be assessed on this non-gated CT examination. Assessment for potential risk factor modification, dietary therapy or pharmacologic therapy may be warranted, if clinically indicated. 3. Severe hepatic steatosis. 4. Additional incidental findings, as above. Electronically Signed   By: Vinnie Langton M.D.   On: 10/14/2018 02:25    Assessment and Plan:   1. Chest pain concerning for unstable angina with history of CAD as outlined above - Initial troponin is negative but EKG shows some progression of TW changes from prior and story is concerning for unstable angina. He had residual disease in 2016 including moderate LM stenosis. He had been doing fairly well up until about 2-3 weeks ago. His blood pressure has been too soft recently to titrate anti-anginals. Will order troponins to fully cycle as he's only had 1 so far. Would continue heparin, ASA, BB as tolerated, and statin/Zetia. Anticipate need for cath, will review with MD. Risks and benefits of cardiac catheterization have been discussed with the patient. These include bleeding, infection, kidney damage, stroke, heart attack, death. The patient understands these risks and is willing to proceed. If he requires  PCI, will need to re-eval plan for cilostazol to avoid triple antiplatelet therapy.  2. Hypotension/diarrhea - diarrhea resolved. IM plans GI pathogen panels. Pt denies any family members with similar sx. Continue to hold HCTZ and lisinopril as he is normotensive off this and anticipate need for cath.  3. Hyperlipidemia - continue current regimen. Check LFTs/lipids in AM.  4. ETOH abuse/tobacco abuse - will add CIWA precautions. Importance of reduction with goal to abstinence discussed.  5. CKD stage III - Cr appears at baseline. CrCl still fairly well preserved. Follow post cath. Hydrate pre-cath.  6. Left foot ankle nodule - pt brought this to my attention physical exam. Seems harder than a lipoma. I am not sure what this represents and asked him to bring it to attention of IM today on rounds.   For questions or updates, please contact [place] Please consult [email or web address] for contact info under Cardiology/STEMI.    Signed, Charlie Pitter, PA-C  10/14/2018 8:46 AM   Attending Note:   The patient was seen and examined.  Agree with assessment and plan as noted above.  Changes made to the above note as needed.  Patient seen and independently examined with  Melina Copa, PA .   We discussed all aspects of the encounter. I agree with the assessment and plan as stated above.  1.  Unstable angina :   Symptoms are worrisome for UAP .   Has documented coronary artery disease by heart cath several years ago.  He has continued to smoke  since that time.  He now presents with worsening chest discomfort with exertion and yesterday had chest pain at rest.     Have discussed risks, benefits, options of cardiac cath He understands and agrees to proceed.       I have spent a total of 40 minutes with patient reviewing hospital  notes , telemetry, EKGs, labs and examining patient as well as establishing an assessment and plan that was discussed with the patient. > 50% of time was spent in direct  patient care.    Thayer Headings, Brooke Bonito., MD, [facility] 10/14/2018, 9:24 AM [address]  [place] Pager [phone]

## 2018-10-14 NOTE — Progress Notes (Signed)
ANTICOAGULATION CONSULT NOTE - Follow Up Consult  Pharmacy Consult for Heparin Indication: chest pain/ACS  Allergies  Allergen Reactions  . Imdur [Isosorbide Nitrate] Other (See Comments)    Made CP worse  . Bee Venom Swelling    Patient Measurements: Height: 5' 11.5" (181.6 cm) Weight: 244 lb 4.8 oz (110.8 kg) IBW/kg (Calculated) : 76.45 Heparin Dosing Weight: 100 kg  Vital Signs: Temp: 97.5 F (36.4 C) (06/07 0309) Temp Source: Oral (06/07 0309) BP: 124/75 (06/07 0309) Pulse Rate: 80 (06/07 0309)  Labs: Recent Labs    10/13/18 2233 10/14/18 1129  HGB 13.2  --   HCT 39.3  --   PLT 235  --   APTT 29  --   LABPROT 13.9  --   INR 1.1  --   HEPARINUNFRC  --  0.29*  CREATININE 1.38*  --   TROPONINI <0.03  --     Estimated Creatinine Clearance: 78.1 mL/min (A) (by C-G formula based on SCr of 1.38 mg/dL (H)).   Medications:  Scheduled:  . aspirin EC  81 mg Oral Daily  . atorvastatin  80 mg Oral Daily  . cilostazol  50 mg Oral BID  . ezetimibe  10 mg Oral Daily  . folic acid  1 mg Oral Daily  . LORazepam  0-4 mg Oral Q6H   Followed by  . [START ON 10/16/2018] LORazepam  0-4 mg Oral Q12H  . metoprolol succinate  25 mg Oral Daily  . multivitamin with minerals  1 tablet Oral Daily  . pantoprazole  20 mg Oral Daily  . sodium chloride flush  3 mL Intravenous Once  . sodium chloride flush  3 mL Intravenous Q12H  . thiamine  100 mg Oral Daily   Or  . thiamine  100 mg Intravenous Daily    Assessment: 12 YOM with unstable angina. Pharmacy has been consulted for heparin dosing.  Initial heparin level is slightly below goal at 0.29. No CBC this morning, but no bleeding noted. No infusion issues noted. Patient to proceed with cardiac cath.  Goal of Therapy:  Heparin level 0.3-0.7 units/ml Monitor platelets by anticoagulation protocol: Yes   Plan:  Give 1500 units bolus x 1  Increase heparin drip to 1450 units/hr Check heparin level in 6 hours Monitor daily  heparin level, CBC, s/sx of bleeding  Jackson Latino, PharmD PGY1 Pharmacy Resident Phone [phone] 10/14/2018     12:26 PM

## 2018-10-14 NOTE — Progress Notes (Signed)
ANTICOAGULATION CONSULT NOTE - Follow Up Consult  Pharmacy Consult for Heparin Indication: chest pain/ACS  Allergies  Allergen Reactions  . Imdur [Isosorbide Nitrate] Other (See Comments)    Made CP worse  . Bee Venom Swelling   Patient Measurements: Height: 5' 11.5" (181.6 cm) Weight: 244 lb 4.8 oz (110.8 kg) IBW/kg (Calculated) : 76.45 Heparin Dosing Weight: 100 kg  Vital Signs: Temp: 98 F (36.7 C) (06/07 1526) Temp Source: Oral (06/07 1526) BP: 146/85 (06/07 1526) Pulse Rate: 87 (06/07 1526)  Labs: Recent Labs    10/13/18 2233 10/14/18 1129 10/14/18 1718 10/14/18 1846  HGB 13.2  --   --   --   HCT 39.3  --   --   --   PLT 235  --   --   --   APTT 29  --   --   --   LABPROT 13.9  --   --   --   INR 1.1  --   --   --   HEPARINUNFRC  --  0.29*  --  0.31  CREATININE 1.38*  --   --   --   TROPONINI <0.03 <0.03 <0.03  --    Estimated Creatinine Clearance: 78.1 mL/min (A) (by C-G formula based on SCr of 1.38 mg/dL (H)).  Assessment: 71 yoM presenting with unstable angina. Pharmacy has been consulted for heparin dosing. No AC PTA.  Heparin level now just within goal range at 0.31 following re-bolus and rate increase this AM. CBC WNL, no bleeding noted. Plan for cath lab in AM.  Goal of Therapy:  Heparin level 0.3-0.7 units/ml Monitor platelets by anticoagulation protocol: Yes   Plan:  Continue heparin gtt at 1450 units/hr Recheck heparin level with AM labs Daily heparin level and CBC Monitor s/sx of bleeding  Erin N. Gerarda Fraction, PharmD, [place] PGY2 Infectious Diseases Pharmacy Resident Phone: [phone] 10/14/2018     7:45 PM

## 2018-10-14 NOTE — H&P (Signed)
TRH H&P    Patient Demographics:    Larry Pham, is a 53 y.o. male  MRN: [id number]  DOB - 09-Oct-1965  Admit Date - 10/13/2018  Referring MD/NP/PA:  Evalee Jefferson PA  Outpatient Primary MD for the patient is Barry Dienes, NP [facility] / [place] [facility]  Patient coming from:  home  Chief complaint- chest pain   HPI:    Larry Pham  is a 53 y.o. male, w hypertension, hyperlipidemia, CAD w STEMI 02/09/2012 w PCI to RCA [place], [place]), s/p DES to RCA 2014 [facility]), s/p cath 04/22/2015 ([facility])=> severe stenosis distal AV groove cirumflex after takeoff of large OM branch (too small for PCI), PAD s/p stent L SFA 07/01/2015, h/o CVA (pons/ left thalamus on prior MRI brain 2016), Jerrye Bushy, h/o Etoh abuse, h/o colonic polyps (tubular adenoma, 03/23/2015), Tobacco abuse apparently presents with c/o chest pain , substernal chest pressure without radiation at rest starting about 7-8 pm.  Relief with slg nitro. Position doesn't seem to affect chest pain.  Pt notes slight dyspnea with extertion. Pt also noted slight lightheadedness today and diarrhea for the past 2 days.  3-4 loose stool per day.  Pt denies coccaine use.  Pt denies fever, chills, cough, palp, n/v, abd pain, constipation, brbpr.  Pt presented to ED for evaluation of chest pain  In ED,  T 98.4  P 80  R 18 Bp 74/40  Pox 93% on RA Wt 111.1 kg  CXR  IMPRESSION: 1.  No radiographic evidence of acute cardiopulmonary disease. 2. Aortic atherosclerosis.  EKG nsr at 95, nl axis, nl pr, borderline prolonged qt, q in 3, avf , t inversion in 3, avf, v6 (appear new compared to 07/17/2018)  Trop <0.03 Wbc 7.4, Hgb 13.2, Plt 235 Na 135, K 3.7  Bun 13, Creatinine 1.38 Hco 19 AG 13 => c/w diarrhea  Pt given aspirin 324mg  po x1, and given Ns 1055mL iv x1 for low bp. Pt will be admitted for w/up of chest pain.    04/22/2015 Cardiac cath=>    Ost 1st Mrg to 1st Mrg lesion, 50% stenosed.   1. Moderate left main stenosis, not flow limiting by FFR (FFR of 0.93) 2. Patent stents mid RCA with minimal restenosis.  3. Moderate distal RCA stenosis.  4. Moderate ostial LAD stenosis.  5. Severe stenosis small caliber distal AV groove Circumflex beyond the takeoff of the large OM branch. (too small for PCI) 6. Normal LV systolic function  Recommendations: He has moderate disease as described above with severe disease in the small caliber distal Circumflex. This vessel is too small for PCI. Continue medical management.     Review of systems:    In addition to the HPI above,  No Fever-chills, No Headache, No changes with Vision or hearing, No problems swallowing food or Liquids, No Cough  No Abdominal pain, No Nausea or Vomiting, No Blood in stool or Urine, No dysuria, No new skin rashes or bruises, No new joints pains-aches,  No new weakness, tingling, numbness in  any extremity, No recent weight gain or loss, No polyuria, polydypsia or polyphagia, No significant Mental Stressors.  All other systems reviewed and are negative.    Past History of the following :    Past Medical History:  Diagnosis Date  . CAD (coronary artery disease)    a. 2013 Inf STEMI s/p RCA DES; b. 04/2015 Cath: LM 40, LAD 40ost, 31m, D1 30, LCX 19m, OM1 50, RCA 30p, 20/30m, 50d. EF 55-65%.  . Carotid arterial disease ([place])    a. 07/2015 Carotid U/S: <50% bilat.  . Carpal tunnel syndrome   . CVA (cerebral vascular accident) ([employer]) 12/09/2014   MRI brain=> remote infarcts seen in pons and left thalamus   . GERD (gastroesophageal reflux disease)   . History of ETOH abuse   . Hyperlipidemia   . Hypertension   . MI (myocardial infarction) ([place]) 02-2012  . PAD (peripheral artery disease) ([place])    a. 06/2015 s/p L SFA stenting; b. 12/2016 PV Angio: R SFA 40-69m, L SFA patent stent-->Med Rx, cillostazol added.  . Seasonal allergies       Past  Surgical History:  Procedure Laterality Date  . ABDOMINAL AORTOGRAM W/LOWER EXTREMITY N/A 01/04/2017   Procedure: ABDOMINAL AORTOGRAM W/LOWER EXTREMITY;  Surgeon: Wellington Hampshire, MD;  Location: [place] [facility];  Service: Cardiovascular;  Laterality: N/A;  . CARDIAC CATHETERIZATION N/A 04/22/2015   Procedure: Left Heart Cath and Coronary Angiography;  Surgeon: Burnell Blanks, MD;  Location: [place] [facility];  Service: Cardiovascular;  Laterality: N/A;  . CARDIAC CATHETERIZATION N/A 04/22/2015   Procedure: Intravascular Pressure Wire/FFR Study;  Surgeon: Burnell Blanks, MD;  Location: [place] [facility];  Service: Cardiovascular;  Laterality: N/A;  . COLONOSCOPY WITH PROPOFOL N/A 03/23/2015   Procedure: COLONOSCOPY WITH PROPOFOL;  Surgeon: Daneil Dolin, MD;  Location: [facility];  Service: Endoscopy;  Laterality: N/A;  Cecum time in 0849  time out  0959  total time 10 mintues  . CORONARY ANGIOPLASTY    . CORONARY ARTERY CATHETERIZATION WITH STENTING N/A 02-2102 AND 05-2012  . LEFT FOREARM SURGERY     s/p machine accident  . LOWER EXTREMITY ANGIOGRAM Bilateral 07/01/2015   Procedure: Lower Extremity Angiogram;  Surgeon: Wellington Hampshire, MD;  Location: [place] [facility];  Service: Cardiovascular;  Laterality: Bilateral;  . PERIPHERAL VASCULAR CATHETERIZATION N/A 07/01/2015   Procedure: Abdominal Aortogram;  Surgeon: Wellington Hampshire, MD;  Location: [place] [facility];  Service: Cardiovascular;  Laterality: N/A;  . PERIPHERAL VASCULAR CATHETERIZATION Left 07/01/2015   Procedure: Peripheral Vascular Intervention;  Surgeon: Wellington Hampshire, MD;  Location: [place] [facility];  Service: Cardiovascular;  Laterality: Left;  SFA  . POLYPECTOMY N/A 03/23/2015   Procedure: POLYPECTOMY;  Surgeon: Daneil Dolin, MD;  Location: [facility];  Service: Endoscopy;  Laterality: N/A;  cecal, descending colon  . TRIGGER FINGER RELEASE     right 5th digit      Social History:      Social  History   Tobacco Use  . Smoking status: Current Every Day Smoker    Packs/day: 0.25    Years: 30.00    Pack years: 7.50    Types: Cigarettes    Start date: 11/22/1977  . Smokeless tobacco: Never Used  . Tobacco comment: 15 cigarettes a day  Substance Use Topics  . Alcohol use: Yes    Alcohol/week: 0.0 standard drinks    Comment: occasionally       Family History :  Family History  Problem Relation Age of Onset  . Hypertension Mother   . Heart disease Mother   . Cirrhosis Father        DIED AT AGE 65  . Breast cancer Paternal Grandmother        DECEASED  . Colon cancer Neg Hx        Home Medications:   Prior to Admission medications   Medication Sig Start Date End Date Taking? Authorizing Provider  albuterol (PROVENTIL HFA;VENTOLIN HFA) 108 (90 BASE) MCG/ACT inhaler Inhale 2 puffs into the lungs every 6 (six) hours as needed for wheezing or shortness of breath. 04/13/15   Arnoldo Lenis, MD  alfuzosin (UROXATRAL) 10 MG 24 hr tablet Take 10 mg by mouth daily.  05/08/15   [provider]  aspirin EC 81 MG tablet Take 81 mg by mouth daily.    [provider]  atorvastatin (LIPITOR) 80 MG tablet Take 80 mg by mouth daily.    [provider]  cilostazol (PLETAL) 50 MG tablet TAKE 1 TABLET BY MOUTH TWICE A DAY 12/28/17   Wellington Hampshire, MD  cilostazol (PLETAL) 50 MG tablet Take 1 tablet (50 mg total) by mouth 2 (two) times daily. 09/24/18   Wellington Hampshire, MD  ezetimibe (ZETIA) 10 MG tablet Take 1 tablet (10 mg total) by mouth daily. 01/12/18 04/12/18  Theora Gianotti, NP  gabapentin (NEURONTIN) 600 MG tablet Take 600 mg by mouth 3 (three) times daily.     [provider]  hydrochlorothiazide (HYDRODIURIL) 25 MG tablet Take 25 mg by mouth daily.    [provider]  lisinopril (PRINIVIL,ZESTRIL) 40 MG tablet Take 40 mg by mouth daily.    [provider]  metoprolol succinate (TOPROL-XL) 25 MG 24 hr tablet  Take 25 mg by mouth daily. 05/15/16   [provider]  naproxen sodium (ANAPROX) 220 MG tablet Take 440 mg by mouth 2 (two) times daily as needed (for pain.).    [provider]  nitroGLYCERIN (NITROSTAT) 0.4 MG SL tablet PLACE 1 TABLET UNDER THE TONGUE & ALLOW TO DISSOLVE EVERY 5 MIN AS NEEDED FOR CHEST PAIN 02/16/18   Arnoldo Lenis, MD  pantoprazole (PROTONIX) 20 MG tablet Take 20 mg by mouth daily. 05/21/16   [provider]  ranitidine (ZANTAC) 150 MG tablet Take 1 tablet (150 mg total) by mouth 2 (two) times daily. 06/03/17   Orpah Greek, MD  sucralfate (CARAFATE) 1 GM/10ML suspension Take 10 mLs (1 g total) by mouth 4 (four) times daily -  with meals and at bedtime. 06/03/17   Orpah Greek, MD  SUMAtriptan (IMITREX) 50 MG tablet Take 1 tablet by mouth every 2 (two) hours as needed for migraine or headache.  02/02/15   [provider]  tiZANidine (ZANAFLEX) 4 MG tablet Take 4 mg by mouth daily.    [provider]  Vitamin D, Ergocalciferol, (DRISDOL) 50000 UNITS CAPS capsule Take 1 capsule by mouth 2 (two) times a week. 02/02/15   [provider]     Allergies:     Allergies  Allergen Reactions  . Bee Venom Swelling     Physical Exam:   Vitals  Blood pressure (!) 91/57, pulse 76, temperature 98.4 F (36.9 C), temperature source Oral, resp. rate 18, height 5\' 11"  (1.803 m), weight 111.1 kg, SpO2 93 %.  1.  General: axox3  2. Psychiatric: euthymic  3. Neurologic: cn2-12 intact, reflexes 2+ symmetric, diffuse with no  clonus, motor 5/5 in all 4 ext  4. HEENMT:  Anicteric, pupils 1.39mm symmetric, direct, consensual intact Mucous membranes slightly dry Neck: no jvd, no bruit  5. Respiratory : CTAB  6. Cardiovascular : rrr s1, s2, no m/g/r, no chest pain with palpation of chest, no rash  7. Gastrointestinal:  Abd: soft, obese, nt, nd, +bs  8. Skin:  Ext: no c/c/e,  No rash  9.Musculoskeletal:   Good ROM,    No adenoapthy    Data Review:    CBC Recent Labs  Lab 10/13/18 2233  WBC 7.4  HGB 13.2  HCT 39.3  PLT 235  MCV 94.0  MCH 31.6  MCHC 33.6  RDW 13.6   ------------------------------------------------------------------------------------------------------------------  Results for orders placed or performed during the hospital encounter of 10/13/18 (from the past 48 hour(s))  Basic metabolic panel     Status: Abnormal   Collection Time: 10/13/18 10:33 PM  Result Value Ref Range   Sodium 135 135 - 145 mmol/L   Potassium 3.7 3.5 - 5.1 mmol/L   Chloride 103 98 - 111 mmol/L   CO2 19 (L) 22 - 32 mmol/L   Glucose, Bld 88 70 - 99 mg/dL   BUN 13 6 - 20 mg/dL   Creatinine, Ser 1.38 (H) 0.61 - 1.24 mg/dL   Calcium 9.0 8.9 - 10.3 mg/dL   GFR calc non Af Amer 58 (L) >60 mL/min   GFR calc Af Amer >60 >60 mL/min   Anion gap 13 5 - 15    Comment: Performed at [facility], [address], [place], [place] [address]  CBC     Status: Abnormal   Collection Time: 10/13/18 10:33 PM  Result Value Ref Range   WBC 7.4 4.0 - 10.5 K/uL   RBC 4.18 (L) 4.22 - 5.81 MIL/uL   Hemoglobin 13.2 13.0 - 17.0 g/dL   HCT 39.3 39.0 - 52.0 %   MCV 94.0 80.0 - 100.0 fL   MCH 31.6 26.0 - 34.0 pg   MCHC 33.6 30.0 - 36.0 g/dL   RDW 13.6 11.5 - 15.5 %   Platelets 235 150 - 400 K/uL   nRBC 0.0 0.0 - 0.2 %    Comment: Performed at [facility], [address] [place], [place] [address]  Troponin I - ONCE - STAT     Status: None   Collection Time: 10/13/18 10:33 PM  Result Value Ref Range   Troponin I <0.03 <0.03 ng/mL    Comment: Performed at [facility], [address], [place], [place] [address]  SARS Coronavirus 2 (CEPHEID - Performed in [place] hospital lab), Hosp Order     Status: None   Collection Time: 10/13/18 11:41 PM  Result Value Ref Range   SARS Coronavirus 2 NEGATIVE NEGATIVE    Comment: (NOTE) If result is NEGATIVE SARS-CoV-2 target nucleic acids are NOT DETECTED. The  SARS-CoV-2 RNA is generally detectable in upper and lower  respiratory specimens during the acute phase of infection. The lowest  concentration of SARS-CoV-2 viral copies this assay can detect is 250  copies / mL. A negative result does not preclude SARS-CoV-2 infection  and should not be used as the sole basis for treatment or other  patient management decisions.  A negative result may occur with  improper specimen collection / handling, submission of specimen other  than nasopharyngeal swab, presence of viral mutation(s) within the  areas targeted by this assay, and inadequate number of viral copies  (<250 copies / mL).  A negative result must be combined with clinical  observations, patient history, and epidemiological information. If result is POSITIVE SARS-CoV-2 target nucleic acids are DETECTED. The SARS-CoV-2 RNA is generally detectable in upper and lower  respiratory specimens dur ing the acute phase of infection.  Positive  results are indicative of active infection with SARS-CoV-2.  Clinical  correlation with patient history and other diagnostic information is  necessary to determine patient infection status.  Positive results do  not rule out bacterial infection or co-infection with other viruses. If result is PRESUMPTIVE POSTIVE SARS-CoV-2 nucleic acids MAY BE PRESENT.   A presumptive positive result was obtained on the submitted specimen  and confirmed on repeat testing.  While 2019 novel coronavirus  (SARS-CoV-2) nucleic acids may be present in the submitted sample  additional confirmatory testing may be necessary for epidemiological  and / or clinical management purposes  to differentiate between  SARS-CoV-2 and other Sarbecovirus currently known to infect humans.  If clinically indicated additional testing with an alternate test  methodology [id number]) is advised. The SARS-CoV-2 RNA is generally  detectable in upper and lower respiratory sp ecimens during the acute   phase of infection. The expected result is Negative. Fact Sheet for Patients:  StrictlyIdeas.no Fact Sheet for Healthcare Providers: BankingDealers.co.za This test is not yet approved or cleared by the [place] FDA and has been authorized for detection and/or diagnosis of SARS-CoV-2 by FDA under an Emergency Use Authorization (EUA).  This EUA will remain in effect (meaning this test can be used) for the duration of the COVID-19 declaration under Section 564(b)(1) of the Act, 21 U.S.C. section 360bbb-3(b)(1), unless the authorization is terminated or revoked sooner. Performed at [facility], [address] [place], [place] [address]     Chemistries  Recent Labs  Lab 10/13/18 2233  NA 135  K 3.7  CL 103  CO2 19*  GLUCOSE 88  BUN 13  CREATININE 1.38*  CALCIUM 9.0   ------------------------------------------------------------------------------------------------------------------  ------------------------------------------------------------------------------------------------------------------ GFR: Estimated Creatinine Clearance: 77.6 mL/min (A) (by C-G formula based on SCr of 1.38 mg/dL (H)). Liver Function Tests: No results for input(s): AST, ALT, ALKPHOS, BILITOT, PROT, ALBUMIN in the last 168 hours. No results for input(s): LIPASE, AMYLASE in the last 168 hours. No results for input(s): AMMONIA in the last 168 hours. Coagulation Profile: No results for input(s): INR, PROTIME in the last 168 hours. Cardiac Enzymes: Recent Labs  Lab 10/13/18 2233  TROPONINI <0.03   BNP (last 3 results) No results for input(s): PROBNP in the last 8760 hours. HbA1C: No results for input(s): HGBA1C in the last 72 hours. CBG: No results for input(s): GLUCAP in the last 168 hours. Lipid Profile: No results for input(s): CHOL, HDL, LDLCALC, TRIG, CHOLHDL, LDLDIRECT in the last 72 hours. Thyroid Function Tests: No results for input(s):  TSH, T4TOTAL, FREET4, T3FREE, THYROIDAB in the last 72 hours. Anemia Panel: No results for input(s): VITAMINB12, FOLATE, FERRITIN, TIBC, IRON, RETICCTPCT in the last 72 hours.  --------------------------------------------------------------------------------------------------------------- Urine analysis:    Component Value Date/Time   COLORURINE YELLOW 12/01/2016 2205   APPEARANCEUR CLEAR 12/01/2016 2205   LABSPEC 1.016 12/01/2016 2205   PHURINE 5.0 12/01/2016 2205   GLUCOSEU NEGATIVE 12/01/2016 2205   HGBUR NEGATIVE 12/01/2016 2205   BILIRUBINUR NEGATIVE 12/01/2016 2205   KETONESUR NEGATIVE 12/01/2016 2205   PROTEINUR NEGATIVE 12/01/2016 2205   NITRITE NEGATIVE 12/01/2016 2205   LEUKOCYTESUR NEGATIVE 12/01/2016 2205      Imaging Results:    Dg Chest 2 View  Result Date: 10/13/2018 CLINICAL DATA:  53 year old male with history of nonradiating chest pain that began 2 hours ago. EXAM: CHEST - 2 VIEW COMPARISON:  Chest x-ray 02/17/2016. FINDINGS: Lung volumes are normal. No consolidative airspace disease. No pleural effusions. No pneumothorax. No pulmonary nodule or mass noted. Pulmonary vasculature and the cardiomediastinal silhouette are within normal limits. Atherosclerosis in the thoracic aorta. IMPRESSION: 1.  No radiographic evidence of acute cardiopulmonary disease. 2. Aortic atherosclerosis. Electronically Signed   By: Vinnie Langton M.D.   On: 10/13/2018 23:39       Assessment & Plan:    Principal Problem:   Chest pain Active Problems:   CAD (coronary artery disease), native coronary artery   Tobacco use   Essential hypertension   Hypotension  Chest pain at rest with EKG changes, Unstable Angina Tele Trop I q6h x3 Check hga1c, lipid, pt, ptt Check cardiac echo Heparin GTT Aspirin 325mg  po oqday Cont Lipitor 80mg  po qday Cont Zetia 10mg  po qday Cont Toprol XL 25mg  po qday STOP Lisinopril due to hypotension Cardiology consulted  Hypotension CTA chest r/o  dissection ordered by ED ? Results pending Check cortisol level Ns bolus of 591mL iv x1 ordered Hydrate with ns at 106mL per hour Check cardiac echo as above  Diarrhea Check stool for C. Diff, GI pathogen panel Imodium 2mg  po qday prn  Non AG acidosis secondary to diarrhea Check cmp in am  PAD s/p L SFA stent Cont Pletal 50mg  po bid  Mild ARF STOP Lisinopril as above STOP Hydrochlorothiazide (pt states not taking anyways, ran out) STOP Naproxen  Hypertension Cont Toprol XL if bp will allow  BPH  STOP Alfuzosin due to hypotension Consider restarting if bp improves  Peripheral neuropathy Pt has not been taking gabapentin, ran out Will not start for now due to hypotension  Gerd Cont PPI  Tobacco use Pt counselled on smoking cessation x 61minutes   DVT Prophylaxis-   heparin  AM Labs Ordered, also please review Full Orders  Family Communication: Admission, patients condition and plan of care including tests being ordered have been discussed with the patient and his mother who indicate understanding and agree with the plan and Code Status.  Code Status:  FULL CODE,  Called his mother to let her know that patient will be sent to [facility] due to lack of cardiology services at [facility] over the weekend  Admission status: Observation: Based on patients clinical presentation and evaluation of above clinical data, I have made determination that patient meets Observation criteria at this time. Pt has unstable angina , cp at rest with ekg changes, and hypotension.  Pt has high risk of clinical deterioration. bp earlier was critically low. Pt will be admitted observation, but depending upon clinical improvement and w/up of unstable angina might require inpatient stay.   Time spent in minutes : 60 minutes, critical care time   Jani Gravel M.D on 10/14/2018 at 12:57 AM

## 2018-10-14 NOTE — Progress Notes (Signed)
Kortez Murtagh  is a 53 y.o. male, w hypertension, hyperlipidemia, CAD w STEMI 02/09/2012 w PCI to RCA [place], [place]), s/p DES to RCA 2014 [facility]), s/p cath 04/22/2015 ([facility])=> severe stenosis distal AV groove cirumflex after takeoff of large OM branch (too small for PCI), PAD s/p stent L SFA 07/01/2015, h/o CVA (pons/ left thalamus on prior MRI brain 2016), Gerd, h/o Etoh abuse, h/o colonic polyps (tubular adenoma, 03/23/2015), Tobacco abuse apparently presents with c/o chest pain  Concerning for unstable angina.   Cardiology consulted and plan for cardiac cath in am.  Pt is currently pain free at this time.  Get X RAY OF THE left ankle.   Please see detailed H&P by Dr Maudie Mercury earlier today .

## 2018-10-14 NOTE — ED Provider Notes (Signed)
hx of CAD (inferior STEMI 2013 s/p PCI to RCA, DES to RCA 2014, last cath 2016 managed medically), LE PAD (s/p prior occurrences of PTA, followed by Dr. Fletcher Anon),   Episode of chest pain concerning for angina that onset at rest.  This resolved after he took nitroglycerin at home.  He is now hypotensive after nitroglycerin but chest pain-free.  He also endorses gradual onset headache that preceded nitroglycerin.  EKG shows more pronounced T wave inversions laterally.  Troponin is negative.  CT scan obtained to rule out dissection given his persistent hypotension which has improved with fluids. Patient did incidentally report diarrhea for several days. Patient will need admission for cardiac rule out. D/wDr Maudie Mercury.  CRITICAL CARE Performed by: Ezequiel Essex Total critical care time: 35 minutes Critical care time was exclusive of separately billable procedures and treating other patients. Critical care was necessary to treat or prevent imminent or life-threatening deterioration. Critical care was time spent personally by me on the following activities: development of treatment plan with patient and/or surrogate as well as nursing, discussions with consultants, evaluation of patient's response to treatment, examination of patient, obtaining history from patient or surrogate, ordering and performing treatments and interventions, ordering and review of laboratory studies, ordering and review of radiographic studies, pulse oximetry and re-evaluation of patient's condition.    Ezequiel Essex, MD 10/14/18 0830

## 2018-10-14 NOTE — Progress Notes (Signed)
ANTICOAGULATION CONSULT NOTE - Preliminary  Pharmacy Consult for heparin Indication: chest pain/ACS  Allergies  Allergen Reactions  . Bee Venom Swelling    Patient Measurements: Height: 5\' 11"  (180.3 cm) Weight: 245 lb (111.1 kg) IBW/kg (Calculated) : 75.3 HEPARIN DW (KG): 99.2   Vital Signs: Temp: 98.4 F (36.9 C) (06/06 2226) Temp Source: Oral (06/06 2226) BP: 119/68 (06/07 0225) Pulse Rate: 83 (06/07 0225)  Labs: Recent Labs    10/13/18 2233  HGB 13.2  HCT 39.3  PLT 235  APTT 29  LABPROT 13.9  INR 1.1  CREATININE 1.38*  TROPONINI <0.03   Estimated Creatinine Clearance: 77.6 mL/min (A) (by C-G formula based on SCr of 1.38 mg/dL (H)).  Medical History: Past Medical History:  Diagnosis Date  . CAD (coronary artery disease)    a. 2013 Inf STEMI s/p RCA DES; b. 04/2015 Cath: LM 40, LAD 40ost, 5m, D1 30, LCX 26m, OM1 50, RCA 30p, 20/23m, 50d. EF 55-65%.  . Carotid arterial disease ([place])    a. 07/2015 Carotid U/S: <50% bilat.  . Carpal tunnel syndrome   . CVA (cerebral vascular accident) ([place]) 12/09/2014   MRI brain=> remote infarcts seen in pons and left thalamus   . GERD (gastroesophageal reflux disease)   . History of ETOH abuse   . Hyperlipidemia   . Hypertension   . MI (myocardial infarction) ([place]) 02-2012  . PAD (peripheral artery disease) ([place])    a. 06/2015 s/p L SFA stenting; b. 12/2016 PV Angio: R SFA 40-40m, L SFA patent stent-->Med Rx, cillostazol added.  . Seasonal allergies     Medications:  Medications Prior to Admission  Medication Sig Dispense Refill Last Dose  . albuterol (PROVENTIL HFA;VENTOLIN HFA) 108 (90 BASE) MCG/ACT inhaler Inhale 2 puffs into the lungs every 6 (six) hours as needed for wheezing or shortness of breath. 1 Inhaler 2 Taking  . alfuzosin (UROXATRAL) 10 MG 24 hr tablet Take 10 mg by mouth daily.   11 Taking  . aspirin EC 81 MG tablet Take 81 mg by mouth daily.   Taking  . atorvastatin (LIPITOR) 80 MG tablet Take 80 mg by  mouth daily.   Taking  . cilostazol (PLETAL) 50 MG tablet TAKE 1 TABLET BY MOUTH TWICE A DAY 60 tablet 3 Taking  . cilostazol (PLETAL) 50 MG tablet Take 1 tablet (50 mg total) by mouth 2 (two) times daily. 60 tablet 0   . ezetimibe (ZETIA) 10 MG tablet Take 1 tablet (10 mg total) by mouth daily. 90 tablet 3 Taking  . gabapentin (NEURONTIN) 600 MG tablet Take 600 mg by mouth 3 (three) times daily.    Taking  . hydrochlorothiazide (HYDRODIURIL) 25 MG tablet Take 25 mg by mouth daily.   Taking  . lisinopril (PRINIVIL,ZESTRIL) 40 MG tablet Take 40 mg by mouth daily.   Taking  . metoprolol succinate (TOPROL-XL) 25 MG 24 hr tablet Take 25 mg by mouth daily.  5 Taking  . naproxen sodium (ANAPROX) 220 MG tablet Take 440 mg by mouth 2 (two) times daily as needed (for pain.).   Taking  . nitroGLYCERIN (NITROSTAT) 0.4 MG SL tablet PLACE 1 TABLET UNDER THE TONGUE & ALLOW TO DISSOLVE EVERY 5 MIN AS NEEDED FOR CHEST PAIN 25 tablet 3 Taking  . pantoprazole (PROTONIX) 20 MG tablet Take 20 mg by mouth daily.  5 Taking  . ranitidine (ZANTAC) 150 MG tablet Take 1 tablet (150 mg total) by mouth 2 (two) times daily. 60 tablet 0 Taking  .  sucralfate (CARAFATE) 1 GM/10ML suspension Take 10 mLs (1 g total) by mouth 4 (four) times daily -  with meals and at bedtime. 420 mL 0 Taking  . SUMAtriptan (IMITREX) 50 MG tablet Take 1 tablet by mouth every 2 (two) hours as needed for migraine or headache.   0 Taking  . tiZANidine (ZANAFLEX) 4 MG tablet Take 4 mg by mouth daily.   Taking  . Vitamin D, Ergocalciferol, (DRISDOL) 50000 UNITS CAPS capsule Take 1 capsule by mouth 2 (two) times a week.  5 Taking    Assessment: 53 yo male with unstable angina starting heparin gtt and transfer to [facility].   Goal of Therapy:  Heparin level 0.3-0.7 units/ml   Plan:  Give 4000 units bolus x 1 Start heparin infusion at 1250 units/hr Check anti-Xa level in 6 hours and daily while on heparin Continue to monitor H&H and platelets Preliminary  review of pertinent patient information completed.  Forestine Na clinical pharmacist will complete review during morning rounds to assess the patient and finalize treatment regimen.  Nyra Capes, [facility] 10/14/2018,3:02 AM

## 2018-10-15 ENCOUNTER — Encounter (HOSPITAL_COMMUNITY)
Admission: EM | Disposition: A | Discharge status: Home / Self Care | Payer: Self-pay | Source: Home / Self Care | Attending: Thoracic Surgery (Cardiothoracic Vascular Surgery)

## 2018-10-15 DIAGNOSIS — E872 Acidosis: Secondary | ICD-10-CM | POA: Diagnosis present

## 2018-10-15 DIAGNOSIS — F17210 Nicotine dependence, cigarettes, uncomplicated: Secondary | ICD-10-CM | POA: Diagnosis present

## 2018-10-15 DIAGNOSIS — D72829 Elevated white blood cell count, unspecified: Secondary | ICD-10-CM | POA: Diagnosis not present

## 2018-10-15 DIAGNOSIS — Z01810 Encounter for preprocedural cardiovascular examination: Secondary | ICD-10-CM | POA: Diagnosis not present

## 2018-10-15 DIAGNOSIS — I25110 Atherosclerotic heart disease of native coronary artery with unstable angina pectoris: Principal | ICD-10-CM

## 2018-10-15 DIAGNOSIS — R197 Diarrhea, unspecified: Secondary | ICD-10-CM | POA: Diagnosis present

## 2018-10-15 DIAGNOSIS — E785 Hyperlipidemia, unspecified: Secondary | ICD-10-CM | POA: Diagnosis present

## 2018-10-15 DIAGNOSIS — Z720 Tobacco use: Secondary | ICD-10-CM | POA: Diagnosis not present

## 2018-10-15 DIAGNOSIS — I252 Old myocardial infarction: Secondary | ICD-10-CM | POA: Diagnosis not present

## 2018-10-15 DIAGNOSIS — Z8673 Personal history of transient ischemic attack (TIA), and cerebral infarction without residual deficits: Secondary | ICD-10-CM | POA: Diagnosis not present

## 2018-10-15 DIAGNOSIS — Z8249 Family history of ischemic heart disease and other diseases of the circulatory system: Secondary | ICD-10-CM | POA: Diagnosis not present

## 2018-10-15 DIAGNOSIS — N183 Chronic kidney disease, stage 3 (moderate): Secondary | ICD-10-CM | POA: Diagnosis present

## 2018-10-15 DIAGNOSIS — I10 Essential (primary) hypertension: Secondary | ICD-10-CM

## 2018-10-15 DIAGNOSIS — I739 Peripheral vascular disease, unspecified: Secondary | ICD-10-CM | POA: Diagnosis present

## 2018-10-15 DIAGNOSIS — I959 Hypotension, unspecified: Secondary | ICD-10-CM | POA: Diagnosis present

## 2018-10-15 DIAGNOSIS — R079 Chest pain, unspecified: Secondary | ICD-10-CM

## 2018-10-15 DIAGNOSIS — I129 Hypertensive chronic kidney disease with stage 1 through stage 4 chronic kidney disease, or unspecified chronic kidney disease: Secondary | ICD-10-CM | POA: Diagnosis present

## 2018-10-15 DIAGNOSIS — F1010 Alcohol abuse, uncomplicated: Secondary | ICD-10-CM | POA: Diagnosis present

## 2018-10-15 DIAGNOSIS — Z7982 Long term (current) use of aspirin: Secondary | ICD-10-CM | POA: Diagnosis not present

## 2018-10-15 DIAGNOSIS — D62 Acute posthemorrhagic anemia: Secondary | ICD-10-CM | POA: Diagnosis not present

## 2018-10-15 DIAGNOSIS — Z955 Presence of coronary angioplasty implant and graft: Secondary | ICD-10-CM | POA: Diagnosis not present

## 2018-10-15 DIAGNOSIS — I200 Unstable angina: Secondary | ICD-10-CM | POA: Diagnosis not present

## 2018-10-15 DIAGNOSIS — Z20828 Contact with and (suspected) exposure to other viral communicable diseases: Secondary | ICD-10-CM | POA: Diagnosis present

## 2018-10-15 DIAGNOSIS — K219 Gastro-esophageal reflux disease without esophagitis: Secondary | ICD-10-CM | POA: Diagnosis present

## 2018-10-15 HISTORY — PX: LEFT HEART CATH AND CORONARY ANGIOGRAPHY: CATH118249

## 2018-10-15 LAB — CBC
HCT: 37.8 % — ABNORMAL LOW (ref 39.0–52.0)
Hemoglobin: 12.6 g/dL — ABNORMAL LOW (ref 13.0–17.0)
MCH: 31.2 pg (ref 26.0–34.0)
MCHC: 33.3 g/dL (ref 30.0–36.0)
MCV: 93.6 fL (ref 80.0–100.0)
Platelets: 218 10*3/uL (ref 150–400)
RBC: 4.04 MIL/uL — ABNORMAL LOW (ref 4.22–5.81)
RDW: 13.2 % (ref 11.5–15.5)
WBC: 7.3 10*3/uL (ref 4.0–10.5)
nRBC: 0 % (ref 0.0–0.2)

## 2018-10-15 LAB — BASIC METABOLIC PANEL
Anion gap: 6 (ref 5–15)
BUN: 12 mg/dL (ref 6–20)
CO2: 23 mmol/L (ref 22–32)
Calcium: 8.7 mg/dL — ABNORMAL LOW (ref 8.9–10.3)
Chloride: 110 mmol/L (ref 98–111)
Creatinine, Ser: 1.24 mg/dL (ref 0.61–1.24)
GFR calc Af Amer: >60 mL/min (ref >60)
GFR calc non Af Amer: >60 mL/min (ref >60)
Glucose, Bld: 99 mg/dL (ref 70–99)
Potassium: 4.1 mmol/L (ref 3.5–5.1)
Sodium: 139 mmol/L (ref 135–145)

## 2018-10-15 LAB — HEPATIC FUNCTION PANEL
ALT: 40 U/L (ref 0–44)
AST: 28 U/L (ref 15–41)
Albumin: 3.3 g/dL — ABNORMAL LOW (ref 3.5–5.0)
Alkaline Phosphatase: 64 U/L (ref 38–126)
Bilirubin, Direct: <0.1 mg/dL (ref 0.0–0.2)
Total Bilirubin: 0.4 mg/dL (ref 0.3–1.2)
Total Protein: 5.6 g/dL — ABNORMAL LOW (ref 6.5–8.1)

## 2018-10-15 LAB — LIPID PANEL
Cholesterol: 118 mg/dL (ref 0–200)
HDL: 48 mg/dL (ref >40)
LDL Cholesterol: 58 mg/dL (ref 0–99)
Total CHOL/HDL Ratio: 2.5 RATIO
Triglycerides: 60 mg/dL (ref <150)
VLDL: 12 mg/dL (ref 0–40)

## 2018-10-15 LAB — HEPARIN LEVEL (UNFRACTIONATED): Heparin Unfractionated: 0.31 IU/mL (ref 0.30–0.70)

## 2018-10-15 LAB — HIV ANTIBODY (ROUTINE TESTING W REFLEX): HIV Screen 4th Generation wRfx: NONREACTIVE

## 2018-10-15 SURGERY — LEFT HEART CATH AND CORONARY ANGIOGRAPHY
Anesthesia: LOCAL

## 2018-10-15 MED ORDER — ATORVASTATIN CALCIUM 80 MG PO TABS: 80.0000 mg | ORAL_TABLET | Freq: Every day | ORAL | Status: DC

## 2018-10-15 MED ORDER — ACETAMINOPHEN 325 MG PO TABS: 650.0000 mg | ORAL_TABLET | ORAL | Status: DC | PRN

## 2018-10-15 MED ORDER — ONDANSETRON HCL 4 MG/2ML IJ SOLN: 4.0000 mg | Freq: Four times a day (QID) | INTRAMUSCULAR | Status: DC | PRN

## 2018-10-15 MED ORDER — LISINOPRIL 40 MG PO TABS
40.0000 mg | ORAL_TABLET | Freq: Every day | ORAL | Status: DC
  Administered 2018-10-15 – 2018-10-16 (×2): 40 mg via ORAL
  Filled 2018-10-15 (×2): qty 1

## 2018-10-15 MED ORDER — MORPHINE SULFATE (PF) 2 MG/ML IV SOLN: 2.0000 mg | INTRAVENOUS | Status: DC | PRN

## 2018-10-15 MED ORDER — HEPARIN (PORCINE) IN NACL 1000-0.9 UT/500ML-% IV SOLN
INTRAVENOUS | Status: AC
  Filled 2018-10-15: qty 1000

## 2018-10-15 MED ORDER — FUROSEMIDE 10 MG/ML IJ SOLN
INTRAMUSCULAR | Status: AC
  Filled 2018-10-15: qty 4

## 2018-10-15 MED ORDER — HEPARIN (PORCINE) IN NACL 1000-0.9 UT/500ML-% IV SOLN
INTRAVENOUS | Status: DC | PRN
  Administered 2018-10-15 (×2): 500 mL

## 2018-10-15 MED ORDER — ASPIRIN 81 MG PO CHEW
81.0000 mg | CHEWABLE_TABLET | Freq: Every day | ORAL | Status: DC
  Administered 2018-10-16: 10:00:00 81 mg via ORAL
  Filled 2018-10-15: qty 1

## 2018-10-15 MED ORDER — SODIUM CHLORIDE 0.9 % WEIGHT BASED INFUSION: 1.0000 mL/kg/h | INTRAVENOUS | Status: DC

## 2018-10-15 MED ORDER — FUROSEMIDE 10 MG/ML IJ SOLN
INTRAMUSCULAR | Status: DC | PRN
  Administered 2018-10-15: 40 mg via INTRAVENOUS

## 2018-10-15 MED ORDER — SODIUM CHLORIDE 0.9 % WEIGHT BASED INFUSION: 3.0000 mL/kg/h | INTRAVENOUS | Status: DC

## 2018-10-15 MED ORDER — HEPARIN SODIUM (PORCINE) 1000 UNIT/ML IJ SOLN
INTRAMUSCULAR | Status: AC
  Filled 2018-10-15: qty 1

## 2018-10-15 MED ORDER — VERAPAMIL HCL 2.5 MG/ML IV SOLN
INTRA_ARTERIAL | Status: DC | PRN
  Administered 2018-10-15: 16:00:00 15 mL via INTRA_ARTERIAL

## 2018-10-15 MED ORDER — HEPARIN (PORCINE) 25000 UT/250ML-% IV SOLN
1800.0000 [IU]/h | INTRAVENOUS | Status: DC
  Administered 2018-10-15: 1500 [IU]/h via INTRAVENOUS
  Administered 2018-10-16: 1650 [IU]/h via INTRAVENOUS
  Administered 2018-10-17: 1800 [IU]/h via INTRAVENOUS
  Filled 2018-10-15 (×2): qty 250

## 2018-10-15 MED ORDER — HYDRALAZINE HCL 20 MG/ML IJ SOLN: 10.0000 mg | INTRAMUSCULAR | Status: AC | PRN

## 2018-10-15 MED ORDER — NITROGLYCERIN 1 MG/10 ML FOR IR/CATH LAB
INTRA_ARTERIAL | Status: AC
  Filled 2018-10-15: qty 10

## 2018-10-15 MED ORDER — HYDRALAZINE HCL 20 MG/ML IJ SOLN
INTRAMUSCULAR | Status: DC | PRN
  Administered 2018-10-15 (×2): 10 mg via INTRAVENOUS

## 2018-10-15 MED ORDER — SODIUM CHLORIDE 0.9% FLUSH: 3.0000 mL | INTRAVENOUS | Status: DC | PRN

## 2018-10-15 MED ORDER — ASPIRIN 81 MG PO CHEW: 81.0000 mg | CHEWABLE_TABLET | ORAL | Status: DC

## 2018-10-15 MED ORDER — LIDOCAINE HCL (PF) 1 % IJ SOLN
INTRAMUSCULAR | Status: AC
  Filled 2018-10-15: qty 30

## 2018-10-15 MED ORDER — SODIUM CHLORIDE 0.9 % IV SOLN: 250.0000 mL | INTRAVENOUS | Status: DC | PRN

## 2018-10-15 MED ORDER — SODIUM CHLORIDE 0.9% FLUSH: 3.0000 mL | Freq: Two times a day (BID) | INTRAVENOUS | Status: DC

## 2018-10-15 MED ORDER — VERAPAMIL HCL 2.5 MG/ML IV SOLN
INTRAVENOUS | Status: AC
  Filled 2018-10-15: qty 2

## 2018-10-15 MED ORDER — SODIUM CHLORIDE 0.9 % IV SOLN
INTRAVENOUS | Status: AC
  Administered 2018-10-15: 18:00:00 via INTRAVENOUS

## 2018-10-15 MED ORDER — LIDOCAINE HCL (PF) 1 % IJ SOLN
INTRAMUSCULAR | Status: DC | PRN
  Administered 2018-10-15: 2 mL via INTRADERMAL

## 2018-10-15 MED ORDER — HEPARIN SODIUM (PORCINE) 1000 UNIT/ML IJ SOLN
INTRAMUSCULAR | Status: DC | PRN
  Administered 2018-10-15: 5000 [IU] via INTRAVENOUS

## 2018-10-15 MED ORDER — HYDRALAZINE HCL 20 MG/ML IJ SOLN
INTRAMUSCULAR | Status: AC
  Filled 2018-10-15: qty 1

## 2018-10-15 MED ORDER — LABETALOL HCL 5 MG/ML IV SOLN
10.0000 mg | INTRAVENOUS | Status: AC | PRN
  Administered 2018-10-15: 10 mg via INTRAVENOUS
  Filled 2018-10-15: qty 4

## 2018-10-15 MED ORDER — SODIUM CHLORIDE 0.9% FLUSH
3.0000 mL | Freq: Two times a day (BID) | INTRAVENOUS | Status: DC
  Administered 2018-10-16: 3 mL via INTRAVENOUS

## 2018-10-15 MED ORDER — MORPHINE SULFATE (PF) 10 MG/ML IV SOLN: 2.0000 mg | INTRAVENOUS | Status: DC | PRN

## 2018-10-15 SURGICAL SUPPLY — 12 items
CATH INFINITI 5FR ANG PIGTAIL (CATHETERS) ×2 IMPLANT
CATH INFINITI JR4 5F (CATHETERS) ×2 IMPLANT
CATH OPTITORQUE TIG 4.0 5F (CATHETERS) ×2 IMPLANT
DEVICE RAD COMP TR BAND LRG (VASCULAR PRODUCTS) ×2 IMPLANT
GLIDESHEATH SLEND A-KIT 6F 22G (SHEATH) ×2 IMPLANT
GUIDEWIRE INQWIRE 1.5J.035X260 (WIRE) ×1 IMPLANT
INQWIRE 1.5J .035X260CM (WIRE) ×2
KIT HEART LEFT (KITS) ×2 IMPLANT
PACK CARDIAC CATHETERIZATION (CUSTOM PROCEDURE TRAY) ×2 IMPLANT
TRANSDUCER W/STOPCOCK (MISCELLANEOUS) ×2 IMPLANT
TUBING CIL FLEX 10 FLL-RA (TUBING) ×2 IMPLANT
WIRE HI TORQ VERSACORE-J 145CM (WIRE) ×2 IMPLANT

## 2018-10-15 NOTE — Interval H&P Note (Signed)
Cath Lab Visit (complete for each Cath Lab visit)  Clinical Evaluation Leading to the Procedure:   ACS: Yes.    Non-ACS:    Anginal Classification: CCS III  Anti-ischemic medical therapy: Minimal Therapy (1 class of medications)  Non-Invasive Test Results: No non-invasive testing performed  Prior CABG: No previous CABG      History and Physical Interval Note:  10/15/2018 3:36 PM  Larry Pham  has presented today for surgery, with the diagnosis of chest pain.  The various methods of treatment have been discussed with the patient and family. After consideration of risks, benefits and other options for treatment, the patient has consented to  Procedure(s): LEFT HEART CATH AND CORONARY ANGIOGRAPHY (N/A) as a surgical intervention.  The patient's history has been reviewed, patient examined, no change in status, stable for surgery.  I have reviewed the patient's chart and labs.  Questions were answered to the patient's satisfaction.     [name]

## 2018-10-15 NOTE — Progress Notes (Signed)
ANTICOAGULATION CONSULT NOTE - Follow Up Consult  Pharmacy Consult for Heparin Indication: chest pain/ACS  Allergies  Allergen Reactions  . Imdur [Isosorbide Nitrate] Other (See Comments)    Made CP worse  . Bee Venom Swelling   Patient Measurements: Height: 5' 11.5" (181.6 cm) Weight: 239 lb 14.4 oz (108.8 kg) IBW/kg (Calculated) : 76.45 Heparin Dosing Weight: 100 kg  Vital Signs: Temp: 98.4 F (36.9 C) (06/08 0418) Temp Source: Oral (06/08 0418) BP: 166/99 (06/08 0418) Pulse Rate: 70 (06/08 0418)  Labs: Recent Labs    10/13/18 2233 10/14/18 1129 10/14/18 1718 10/14/18 1846 10/15/18 0402  HGB 13.2  --   --   --  12.6*  HCT 39.3  --   --   --  37.8*  PLT 235  --   --   --  218  APTT 29  --   --   --   --   LABPROT 13.9  --   --   --   --   INR 1.1  --   --   --   --   HEPARINUNFRC  --  0.29*  --  0.31 0.31  CREATININE 1.38*  --   --   --  1.24  TROPONINI <0.03 <0.03 <0.03  --   --    Estimated Creatinine Clearance: 86.1 mL/min (by C-G formula based on SCr of 1.24 mg/dL).  Assessment: 89 yoM presenting with unstable angina. Pharmacy has been consulted for heparin dosing. No AC PTA.  Heparin level this morning remains therapeutic (Hl 0.31, goal of 0.3-0.7). Hgb/Hct slight drop, plts wnl - no overt bleeding noted at this time. Plans for cath today, 6/8. Will increase the Heparin drip rate slightly to keep within range.   Goal of Therapy:  Heparin level 0.3-0.7 units/ml Monitor platelets by anticoagulation protocol: Yes   Plan:  - Increase Heparin drip rate slightly to 1500 units/hr (15 ml/hr) - Will continue to monitor for any signs/symptoms of bleeding and will follow up with heparin level in the a.m. vs post-cath plans  Thank you for allowing pharmacy to be a part of this patient's care.  Alycia Rossetti, PharmD, BCPS Clinical Pharmacist Clinical phone for 10/15/2018: [phone] 10/15/2018 9:55 AM   **Pharmacist phone directory can now be found on [email or web address] (PW  [email or web address]).  Listed under [place].

## 2018-10-15 NOTE — Progress Notes (Signed)
PROGRESS NOTE    Larry Pham  TDD:[id number] DOB: [birth year] DOA: 10/13/2018 PCP: Barry Dienes, NP    Brief Narrative: Larry Pham a52 y.o.male,w hypertension, hyperlipidemia, CADw STEMI 02/09/2012 w PCI to RCA Larry Pham, [place]), s/p DES to RCA 2014 [facility]), s/p cath 04/22/2015 ([facility])=> severe stenosis distal AV groove cirumflex after takeoff of large OM branch (too small for PCI), PADs/p stent L SFA 07/01/2015, h/o CVA (pons/ left thalamus on prior MRI brain 2016), Gerd, h/o Etoh abuse, h/o colonic polyps (tubular adenoma, 03/23/2015), Tobacco abuse apparently presents with c/o chest pain  Concerning for unstable angina.  Assessment & Plan:   Principal Problem:   Chest pain Active Problems:   CAD (coronary artery disease), native coronary artery   Tobacco use   Essential hypertension   Hypotension   Unstable angina (HCC)   Unstable angina: On IV heparin, plan for cath today.  troponis negative.  Continue with aspirin, BB and statin.     Essential hypertension:  Well controlled.  Restart lisinopril after cath.    Hyperlipidemia:  Resume statin.    Stage 3 CKD: Creatinine at baseline.   ETOH abuse:  On CIWA.      DVT prophylaxis: heparin  Code Status: full code.  Family Communication: none at bedside.  Disposition Plan: pending cath.    Consultants:   cardiology   Procedures: cath today.    Antimicrobials: none.    Subjective: No chest pain today. Or sob.   Objective: Vitals:   10/14/18 2041 10/15/18 0418 10/15/18 0421 10/15/18 1256  BP: (!) 154/96 (!) 166/99  (!) 173/102  Pulse: 76 70  70  Resp:  (!) 21    Temp: 98.2 F (36.8 C) 98.4 F (36.9 C)  98.3 F (36.8 C)  TempSrc: Oral Oral  Oral  SpO2: 96% 99%  98%  Weight:   108.8 kg   Height:        Intake/Output Summary (Last 24 hours) at 10/15/2018 1513 Last data filed at 10/15/2018 0700 Gross per 24 hour  Intake 1068.64 ml  Output 300 ml  Net 768.64 ml   Filed Weights    10/13/18 2219 10/14/18 0309 10/15/18 0421  Weight: 111.1 kg 110.8 kg 108.8 kg    Examination:  General exam: Appears calm and comfortable  Respiratory system: Clear to auscultation. Respiratory effort normal. Cardiovascular system: S1 & S2 heard, RRR. No JVD. No pedal edema. Gastrointestinal system: Abdomen is nondistended, soft and nontender. No organomegaly or masses felt. Normal bowel sounds heard. Central nervous system: Alert and oriented. No focal neurological deficits. Extremities: Symmetric 5 x 5 power. Skin: No rashes, lesions or ulcers Psychiatry: . Mood & affect appropriate.     Data Reviewed: I have personally reviewed following labs and imaging studies  CBC: Recent Labs  Lab 10/13/18 2233 10/15/18 0402  WBC 7.4 7.3  HGB 13.2 12.6*  HCT 39.3 37.8*  MCV 94.0 93.6  PLT 235 623   Basic Metabolic Panel: Recent Labs  Lab 10/13/18 2233 10/15/18 0402  NA 135 139  K 3.7 4.1  CL 103 110  CO2 19* 23  GLUCOSE 88 99  BUN 13 12  CREATININE 1.38* 1.24  CALCIUM 9.0 8.7*   GFR: Estimated Creatinine Clearance: 86.1 mL/min (by C-G formula based on SCr of 1.24 mg/dL). Liver Function Tests: Recent Labs  Lab 10/15/18 0402  AST 28  ALT 40  ALKPHOS 64  BILITOT 0.4  PROT 5.6*  ALBUMIN 3.3*   No results for input(s): LIPASE, AMYLASE in the  last 168 hours. No results for input(s): AMMONIA in the last 168 hours. Coagulation Profile: Recent Labs  Lab 10/13/18 2233  INR 1.1   Cardiac Enzymes: Recent Labs  Lab 10/13/18 2233 10/14/18 1129 10/14/18 1718  TROPONINI <0.03 <0.03 <0.03   BNP (last 3 results) No results for input(s): PROBNP in the last 8760 hours. HbA1C: No results for input(s): HGBA1C in the last 72 hours. CBG: No results for input(s): GLUCAP in the last 168 hours. Lipid Profile: Recent Labs    10/15/18 0402  CHOL 118  HDL 48  LDLCALC 58  TRIG 60  CHOLHDL 2.5   Thyroid Function Tests: No results for input(s): TSH, T4TOTAL, FREET4,  T3FREE, THYROIDAB in the last 72 hours. Anemia Panel: No results for input(s): VITAMINB12, FOLATE, FERRITIN, TIBC, IRON, RETICCTPCT in the last 72 hours. Sepsis Labs: No results for input(s): PROCALCITON, LATICACIDVEN in the last 168 hours.  Recent Results (from the past 240 hour(s))  SARS Coronavirus 2 (CEPHEID - Performed in [place] hospital lab), Hosp Order     Status: None   Collection Time: 10/13/18 11:41 PM  Result Value Ref Range Status   SARS Coronavirus 2 NEGATIVE NEGATIVE Final    Comment: (NOTE) If result is NEGATIVE SARS-CoV-2 target nucleic acids are NOT DETECTED. The SARS-CoV-2 RNA is generally detectable in upper and lower  respiratory specimens during the acute phase of infection. The lowest  concentration of SARS-CoV-2 viral copies this assay can detect is 250  copies / mL. A negative result does not preclude SARS-CoV-2 infection  and should not be used as the sole basis for treatment or other  patient management decisions.  A negative result may occur with  improper specimen collection / handling, submission of specimen other  than nasopharyngeal swab, presence of viral mutation(s) within the  areas targeted by this assay, and inadequate number of viral copies  (<250 copies / mL). A negative result must be combined with clinical  observations, patient history, and epidemiological information. If result is POSITIVE SARS-CoV-2 target nucleic acids are DETECTED. The SARS-CoV-2 RNA is generally detectable in upper and lower  respiratory specimens dur ing the acute phase of infection.  Positive  results are indicative of active infection with SARS-CoV-2.  Clinical  correlation with patient history and other diagnostic information is  necessary to determine patient infection status.  Positive results do  not rule out bacterial infection or co-infection with other viruses. If result is PRESUMPTIVE POSTIVE SARS-CoV-2 nucleic acids MAY BE PRESENT.   A presumptive  positive result was obtained on the submitted specimen  and confirmed on repeat testing.  While 2019 novel coronavirus  (SARS-CoV-2) nucleic acids may be present in the submitted sample  additional confirmatory testing may be necessary for epidemiological  and / or clinical management purposes  to differentiate between  SARS-CoV-2 and other Sarbecovirus currently known to infect humans.  If clinically indicated additional testing with an alternate test  methodology [phone]) is advised. The SARS-CoV-2 RNA is generally  detectable in upper and lower respiratory sp ecimens during the acute  phase of infection. The expected result is Negative. Fact Sheet for Patients:  StrictlyIdeas.no Fact Sheet for Healthcare Providers: BankingDealers.co.za This test is not yet approved or cleared by the [place] FDA and has been authorized for detection and/or diagnosis of SARS-CoV-2 by FDA under an Emergency Use Authorization (EUA).  This EUA will remain in effect (meaning this test can be used) for the duration of the COVID-19 declaration under Section 564(b)(1)  of the Act, 21 U.S.C. section 360bbb-3(b)(1), unless the authorization is terminated or revoked sooner. Performed at [facility], [address] [place], [place] [address]          Radiology Studies: Dg Chest 2 View  Result Date: 10/13/2018 CLINICAL DATA:  53 year old male with history of nonradiating chest pain that began 2 hours ago. EXAM: CHEST - 2 VIEW COMPARISON:  Chest x-ray 02/17/2016. FINDINGS: Lung volumes are normal. No consolidative airspace disease. No pleural effusions. No pneumothorax. No pulmonary nodule or mass noted. Pulmonary vasculature and the cardiomediastinal silhouette are within normal limits. Atherosclerosis in the thoracic aorta. IMPRESSION: 1.  No radiographic evidence of acute cardiopulmonary disease. 2. Aortic atherosclerosis. Electronically Signed   By: Vinnie Langton M.D.   On: 10/13/2018 23:39   Ct Head Wo Contrast  Result Date: 10/14/2018 CLINICAL DATA:  53 year old male with history of generalized weakness. Chest pain. EXAM: CT HEAD WITHOUT CONTRAST TECHNIQUE: Contiguous axial images were obtained from the base of the skull through the vertex without intravenous contrast. COMPARISON:  Head CT 09/18/2015. FINDINGS: Brain: No evidence of acute infarction, hemorrhage, hydrocephalus, extra-axial collection or mass lesion/mass effect. Vascular: No hyperdense vessel or unexpected calcification. Skull: Normal. Negative for fracture or focal lesion. Sinuses/Orbits: No acute finding. Other: None. IMPRESSION: 1. No acute intracranial abnormalities. The appearance of the brain is normal for age. Electronically Signed   By: Vinnie Langton M.D.   On: 10/14/2018 02:12   Dg Ankle Left Port  Result Date: 10/14/2018 CLINICAL DATA:  Rheumatoid nodule EXAM: PORTABLE LEFT ANKLE - 2 VIEW COMPARISON:  None. FINDINGS: No fracture or dislocation of the left ankle. 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 No large ankle joint effusion. There is no radiographic evidence of erosive or inflammatory arthropathy. Moderate plantar calcaneal spur. IMPRESSION: No fracture or dislocation of the left ankle. 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 No large ankle joint effusion. There is no radiographic evidence of erosive or inflammatory arthropathy. Moderate plantar calcaneal spur. MRI is the test of choice to evaluate for inflammatory arthritis/synovitis and soft tissue complications of systemic inflammatory disease, per stated indication of rheumatoid nodule. Electronically Signed   By: Eddie Candle M.D.   On: 10/14/2018 16:32   Ct Angio Chest/abd/pel For Dissection W And/or Wo Contrast  Result Date: 10/14/2018 CLINICAL DATA:  53 year old male with history of nonradiating chest pain for the past 2 hours. Shortness of breath. Generalized weakness. EXAM: CT ANGIOGRAPHY CHEST, ABDOMEN  AND PELVIS TECHNIQUE: Multidetector CT imaging through the chest, abdomen and pelvis was performed using the standard protocol during bolus administration of intravenous contrast. Multiplanar reconstructed images and MIPs were obtained and reviewed to evaluate the vascular anatomy. CONTRAST:  132mL OMNIPAQUE IOHEXOL 350 MG/ML SOLN COMPARISON:  CTA of the chest, abdomen and pelvis 06/03/2017. FINDINGS: CTA CHEST FINDINGS Cardiovascular: Heart size is normal. There is no significant pericardial fluid, thickening or pericardial calcification. There is aortic atherosclerosis, as well as atherosclerosis of the great vessels of the mediastinum and the coronary arteries, including calcified atherosclerotic plaque in the left main, left anterior descending, left circumflex and right coronary arteries. No evidence of thoracic aortic aneurysm or dissection. Noncontrast images demonstrate no crescentic high attenuation associated with the wall of the thoracic aorta to suggest acute intramural hemorrhage. Mediastinum/Nodes: No pathologically enlarged mediastinal or hilar lymph nodes. Densely calcified subcarinal lymph nodes incidentally noted. Esophagus is unremarkable in appearance. No axillary lymphadenopathy. Lungs/Pleura: Calcified granuloma in the lateral segment of the right middle lobe again noted.  No other suspicious appearing pulmonary nodules or masses are noted. No acute consolidative airspace disease. No pleural effusions. Musculoskeletal: There are no aggressive appearing lytic or blastic lesions noted in the visualized portions of the skeleton. Review of the MIP images confirms the above findings. CTA ABDOMEN AND PELVIS FINDINGS VASCULAR Aorta: Normal caliber aorta without aneurysm, dissection, vasculitis or significant stenosis. Celiac: Patent without evidence of aneurysm, dissection, vasculitis or significant stenosis. SMA: Patent without evidence of aneurysm, dissection, vasculitis or significant stenosis.  Renals: Both renal arteries are patent without evidence of aneurysm, dissection, vasculitis, fibromuscular dysplasia or significant stenosis. IMA: Patent without evidence of aneurysm, dissection, vasculitis or significant stenosis. Inflow: Patent without evidence of aneurysm, dissection, vasculitis or significant stenosis. Veins: No obvious venous abnormality within the limitations of this arterial phase study. Review of the MIP images confirms the above findings. NON-VASCULAR Hepatobiliary: Severe diffuse low attenuation throughout the hepatic parenchyma, indicative of hepatic steatosis. No suspicious cystic or solid hepatic lesions. No intra or extrahepatic biliary ductal dilatation. Gallbladder is normal in appearance. Pancreas: No pancreatic mass. No pancreatic ductal dilatation. No pancreatic or peripancreatic fluid or inflammatory changes. Spleen: Unremarkable. Adrenals/Urinary Tract: Bilateral kidneys and bilateral adrenal glands are normal in appearance. No hydroureteronephrosis. Urinary bladder wall is diffusely thickened. Stomach/Bowel: Normal appearance of the stomach. No pathologic dilatation of small bowel or colon. Normal appendix. Lymphatic: Aortic atherosclerosis, without evidence of aneurysm or dissection in the abdominal or pelvic vasculature. No lymphadenopathy noted in the abdomen or pelvis. Reproductive: Severe median lobe hypertrophy of the prostate gland again noted. Seminal vesicles are unremarkable in appearance. Other: No significant volume of ascites.  No pneumoperitoneum. Musculoskeletal: There are no aggressive appearing lytic or blastic lesions noted in the visualized portions of the skeleton. Review of the MIP images confirms the above findings. IMPRESSION: 1. No findings to suggest acute aortic syndrome. No acute findings noted in the chest, abdomen or pelvis to account for the patient's symptoms. 2. Aortic atherosclerosis, in addition to left main and 3 vessel coronary artery  disease. Please note that although the presence of coronary artery calcium documents the presence of coronary artery disease, the severity of this disease and any potential stenosis cannot be assessed on this non-gated CT examination. Assessment for potential risk factor modification, dietary therapy or pharmacologic therapy may be warranted, if clinically indicated. 3. Severe hepatic steatosis. 4. Additional incidental findings, as above. Electronically Signed   By: Vinnie Langton M.D.   On: 10/14/2018 02:25        Scheduled Meds:  atorvastatin  80 mg Oral Daily   cilostazol  50 mg Oral BID   ezetimibe  10 mg Oral Daily   folic acid  1 mg Oral Daily   LORazepam  0-4 mg Oral Q6H   Followed by   Derrill Memo ON 10/16/2018] LORazepam  0-4 mg Oral Q12H   metoprolol succinate  25 mg Oral Daily   multivitamin with minerals  1 tablet Oral Daily   pantoprazole  20 mg Oral Daily   sodium chloride flush  3 mL Intravenous Once   sodium chloride flush  3 mL Intravenous Q12H   thiamine  100 mg Oral Daily   Or   thiamine  100 mg Intravenous Daily   Continuous Infusions:  sodium chloride     sodium chloride 1 mL/kg/hr (10/15/18 0519)   heparin 1,500 Units/hr (10/15/18 1115)   sodium chloride Stopped (10/14/18 0235)     LOS: 0 days    Time spent: 28 minutes  Hosie Poisson, MD [facility] Pager [phone]  If 7PM-7AM, please contact night-coverage [email or web address] Password [email or web address] 10/15/2018, 3:13 PM

## 2018-10-15 NOTE — H&P (View-Only) (Signed)
Progress Note  Patient Name: Larry Pham Date of Encounter: 10/15/2018  Primary Cardiologist: Dr. Carlyle Dolly, MD  Subjective   Denies angina, shortness of breath.  Anticipate cardiac cath today, 10/15/2018  Inpatient Medications    Scheduled Meds: . aspirin EC  81 mg Oral Daily  . atorvastatin  80 mg Oral Daily  . cilostazol  50 mg Oral BID  . ezetimibe  10 mg Oral Daily  . folic acid  1 mg Oral Daily  . LORazepam  0-4 mg Oral Q6H   Followed by  . [START ON 10/16/2018] LORazepam  0-4 mg Oral Q12H  . metoprolol succinate  25 mg Oral Daily  . multivitamin with minerals  1 tablet Oral Daily  . pantoprazole  20 mg Oral Daily  . sodium chloride flush  3 mL Intravenous Once  . sodium chloride flush  3 mL Intravenous Q12H  . thiamine  100 mg Oral Daily   Or  . thiamine  100 mg Intravenous Daily   Continuous Infusions: . sodium chloride    . sodium chloride 1 mL/kg/hr (10/15/18 0519)  . heparin 1,450 Units/hr (10/15/18 0300)  . sodium chloride Stopped (10/14/18 0235)   PRN Meds: sodium chloride, loperamide, LORazepam **OR** LORazepam, sodium chloride flush   Vital Signs    Vitals:   10/14/18 1526 10/14/18 2041 10/15/18 0418 10/15/18 0421  BP: (!) 146/85 (!) 154/96 (!) 166/99   Pulse: 87 76 70   Resp:   (!) 21   Temp: 98 F (36.7 C) 98.2 F (36.8 C) 98.4 F (36.9 C)   TempSrc: Oral Oral Oral   SpO2: 100% 96% 99%   Weight:    108.8 kg  Height:        Intake/Output Summary (Last 24 hours) at 10/15/2018 0709 Last data filed at 10/15/2018 0519 Gross per 24 hour  Intake 824.92 ml  Output 1125 ml  Net -300.08 ml   Filed Weights   10/13/18 2219 10/14/18 0309 10/15/18 0421  Weight: 111.1 kg 110.8 kg 108.8 kg    Physical Exam   General: Well developed, well nourished, NAD Skin: Warm, dry, intact  Neck: Negative for carotid bruits. No JVD Lungs:Clear to ausculation bilaterally. No wheezes, rales, or rhonchi. Breathing is unlabored. Cardiovascular: RRR  with S1 S2. No murmurs, rubs, gallops, or LV heave appreciated. Abdomen: Soft, non-tender, non-distended. No obvious abdominal masses. MSK: Strength and tone appear normal for age. 5/5 in all extremities Extremities: No edema. No clubbing or cyanosis. DP/PT pulses 2+ bilaterally. Bony prominence left foot Neuro: Alert and oriented. No focal deficits. No facial asymmetry. MAE spontaneously. Psych: Responds to questions appropriately with normal affect.    Labs    Chemistry Recent Labs  Lab 10/13/18 2233 10/15/18 0402  NA 135 139  K 3.7 4.1  CL 103 110  CO2 19* 23  GLUCOSE 88 99  BUN 13 12  CREATININE 1.38* 1.24  CALCIUM 9.0 8.7*  PROT  --  5.6*  ALBUMIN  --  3.3*  AST  --  28  ALT  --  40  ALKPHOS  --  64  BILITOT  --  0.4  GFRNONAA 58* >60  GFRAA >60 >60  ANIONGAP 13 6     Hematology Recent Labs  Lab 10/13/18 2233 10/15/18 0402  WBC 7.4 7.3  RBC 4.18* 4.04*  HGB 13.2 12.6*  HCT 39.3 37.8*  MCV 94.0 93.6  MCH 31.6 31.2  MCHC 33.6 33.3  RDW 13.6 13.2  PLT 235 218    Cardiac Enzymes Recent Labs  Lab 10/13/18 2233 10/14/18 1129 10/14/18 1718  TROPONINI <0.03 <0.03 <0.03   No results for input(s): TROPIPOC in the last 168 hours.   BNPNo results for input(s): BNP, PROBNP in the last 168 hours.   DDimer No results for input(s): DDIMER in the last 168 hours.   Radiology    Dg Chest 2 View  Result Date: 10/13/2018 CLINICAL DATA:  53 year old male with history of nonradiating chest pain that began 2 hours ago. EXAM: CHEST - 2 VIEW COMPARISON:  Chest x-ray 02/17/2016. FINDINGS: Lung volumes are normal. No consolidative airspace disease. No pleural effusions. No pneumothorax. No pulmonary nodule or mass noted. Pulmonary vasculature and the cardiomediastinal silhouette are within normal limits. Atherosclerosis in the thoracic aorta. IMPRESSION: 1.  No radiographic evidence of acute cardiopulmonary disease. 2. Aortic atherosclerosis. Electronically Signed   By:  Vinnie Langton M.D.   On: 10/13/2018 23:39   Ct Head Wo Contrast  Result Date: 10/14/2018 CLINICAL DATA:  53 year old male with history of generalized weakness. Chest pain. EXAM: CT HEAD WITHOUT CONTRAST TECHNIQUE: Contiguous axial images were obtained from the base of the skull through the vertex without intravenous contrast. COMPARISON:  Head CT 09/18/2015. FINDINGS: Brain: No evidence of acute infarction, hemorrhage, hydrocephalus, extra-axial collection or mass lesion/mass effect. Vascular: No hyperdense vessel or unexpected calcification. Skull: Normal. Negative for fracture or focal lesion. Sinuses/Orbits: No acute finding. Other: None. IMPRESSION: 1. No acute intracranial abnormalities. The appearance of the brain is normal for age. Electronically Signed   By: Vinnie Langton M.D.   On: 10/14/2018 02:12   Dg Ankle Left Port  Result Date: 10/14/2018 CLINICAL DATA:  Rheumatoid nodule EXAM: PORTABLE LEFT ANKLE - 2 VIEW COMPARISON:  None. FINDINGS: No fracture or dislocation of the left ankle. 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 No large ankle joint effusion. There is no radiographic evidence of erosive or inflammatory arthropathy. Moderate plantar calcaneal spur. IMPRESSION: No fracture or dislocation of the left ankle. 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 No large ankle joint effusion. There is no radiographic evidence of erosive or inflammatory arthropathy. Moderate plantar calcaneal spur. MRI is the test of choice to evaluate for inflammatory arthritis/synovitis and soft tissue complications of systemic inflammatory disease, per stated indication of rheumatoid nodule. Electronically Signed   By: Eddie Candle M.D.   On: 10/14/2018 16:32   Ct Angio Chest/abd/pel For Dissection W And/or Wo Contrast  Result Date: 10/14/2018 CLINICAL DATA:  53 year old male with history of nonradiating chest pain for the past 2 hours. Shortness of breath. Generalized weakness. EXAM: CT ANGIOGRAPHY CHEST,  ABDOMEN AND PELVIS TECHNIQUE: Multidetector CT imaging through the chest, abdomen and pelvis was performed using the standard protocol during bolus administration of intravenous contrast. Multiplanar reconstructed images and MIPs were obtained and reviewed to evaluate the vascular anatomy. CONTRAST:  180mL OMNIPAQUE IOHEXOL 350 MG/ML SOLN COMPARISON:  CTA of the chest, abdomen and pelvis 06/03/2017. FINDINGS: CTA CHEST FINDINGS Cardiovascular: Heart size is normal. There is no significant pericardial fluid, thickening or pericardial calcification. There is aortic atherosclerosis, as well as atherosclerosis of the great vessels of the mediastinum and the coronary arteries, including calcified atherosclerotic plaque in the left main, left anterior descending, left circumflex and right coronary arteries. No evidence of thoracic aortic aneurysm or dissection. Noncontrast images demonstrate no crescentic high attenuation associated with the wall of the thoracic aorta to suggest acute intramural hemorrhage. Mediastinum/Nodes: No pathologically enlarged mediastinal or hilar lymph  nodes. Densely calcified subcarinal lymph nodes incidentally noted. Esophagus is unremarkable in appearance. No axillary lymphadenopathy. Lungs/Pleura: Calcified granuloma in the lateral segment of the right middle lobe again noted. No other suspicious appearing pulmonary nodules or masses are noted. No acute consolidative airspace disease. No pleural effusions. Musculoskeletal: There are no aggressive appearing lytic or blastic lesions noted in the visualized portions of the skeleton. Review of the MIP images confirms the above findings. CTA ABDOMEN AND PELVIS FINDINGS VASCULAR Aorta: Normal caliber aorta without aneurysm, dissection, vasculitis or significant stenosis. Celiac: Patent without evidence of aneurysm, dissection, vasculitis or significant stenosis. SMA: Patent without evidence of aneurysm, dissection, vasculitis or significant  stenosis. Renals: Both renal arteries are patent without evidence of aneurysm, dissection, vasculitis, fibromuscular dysplasia or significant stenosis. IMA: Patent without evidence of aneurysm, dissection, vasculitis or significant stenosis. Inflow: Patent without evidence of aneurysm, dissection, vasculitis or significant stenosis. Veins: No obvious venous abnormality within the limitations of this arterial phase study. Review of the MIP images confirms the above findings. NON-VASCULAR Hepatobiliary: Severe diffuse low attenuation throughout the hepatic parenchyma, indicative of hepatic steatosis. No suspicious cystic or solid hepatic lesions. No intra or extrahepatic biliary ductal dilatation. Gallbladder is normal in appearance. Pancreas: No pancreatic mass. No pancreatic ductal dilatation. No pancreatic or peripancreatic fluid or inflammatory changes. Spleen: Unremarkable. Adrenals/Urinary Tract: Bilateral kidneys and bilateral adrenal glands are normal in appearance. No hydroureteronephrosis. Urinary bladder wall is diffusely thickened. Stomach/Bowel: Normal appearance of the stomach. No pathologic dilatation of small bowel or colon. Normal appendix. Lymphatic: Aortic atherosclerosis, without evidence of aneurysm or dissection in the abdominal or pelvic vasculature. No lymphadenopathy noted in the abdomen or pelvis. Reproductive: Severe median lobe hypertrophy of the prostate gland again noted. Seminal vesicles are unremarkable in appearance. Other: No significant volume of ascites.  No pneumoperitoneum. Musculoskeletal: There are no aggressive appearing lytic or blastic lesions noted in the visualized portions of the skeleton. Review of the MIP images confirms the above findings. IMPRESSION: 1. No findings to suggest acute aortic syndrome. No acute findings noted in the chest, abdomen or pelvis to account for the patient's symptoms. 2. Aortic atherosclerosis, in addition to left main and 3 vessel coronary  artery disease. Please note that although the presence of coronary artery calcium documents the presence of coronary artery disease, the severity of this disease and any potential stenosis cannot be assessed on this non-gated CT examination. Assessment for potential risk factor modification, dietary therapy or pharmacologic therapy may be warranted, if clinically indicated. 3. Severe hepatic steatosis. 4. Additional incidental findings, as above. Electronically Signed   By: Vinnie Langton M.D.   On: 10/14/2018 02:25   Telemetry    10/15/2018: NSR- Personally Reviewed  ECG    10/13/2018: NSR with TWI and inferior/lateral leads, HR 97- Personally Reviewed  Cardiac Studies   Echocardiogram 10/14/2018:  1. The left ventricle has hyperdynamic systolic function, with an ejection fraction of >65%. The cavity size was normal. Left ventricular diastolic parameters were normal.  2. The right ventricle has normal systolic function. The cavity was normal. There is no increase in right ventricular wall thickness.  3. Left atrial size was moderately dilated.  4. Right atrial size was mildly dilated.  5. Moderate thickening of the mitral valve leaflet.  6. The aortic valve is tricuspid.  Cardiac catheterization 04/22/2015:  Ost 1st Mrg to 1st Mrg lesion, 50% stenosed.   1. Moderate left main stenosis, not flow limiting by FFR (FFR of 0.93) 2. Patent stents mid  RCA with minimal restenosis.  3. Moderate distal RCA stenosis.  4. Moderate ostial LAD stenosis.  5. Severe stenosis small caliber distal AV groove Circumflex beyond the takeoff of the large OM branch. (too small for PCI) 6. Normal LV systolic function  Recommendations: He has moderate disease as described above with severe disease in the small caliber distal Circumflex. This vessel is too small for PCI. Continue medical management.   Patient Profile     53 y.o. male with a hx of CAD (inferior STEMI 2013 s/p PCI to RCA, DES to RCA 2014, last  cath 2016 managed medically), LE PAD (s/p prior occurrences of PTA, followed by Dr. 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 Maudie Mercury.  Assessment & Plan    1. Chest pain with history of CAD, concerning for unstable angina:  -EKG with new negative T waves in the inferior leads from prior tracings>> concerning for unstable angina given presentation -Had residual disease in 2016 including moderate LM stenosis -Troponin, negative x3, <0.03, <0.03, <0.03 -Continue heparin, ASA, BB as tolerated, and statin/Zetia.  -Given presenting symptoms and known history of CAD, plan for cardiac cath today, 10/15/2018  -If he requires PCI, will need to re-eval plan for cilostazol to avoid triple antiplatelet therapy.  2. Hypertension: -Elevated, 166/99, 154/96, 146/85  -Had issues with hypotension, diarrhea on presentation in which HCTZ and lisinopril are on hold, also in anticipation of cardiac cath -Will need further antihypertensive titration in the post-cath setting  3. Hyperlipidemia: -Last LDL, 58 on 10/15/2018 with goal of 70mg /dl -Continue current regimen  4. ETOH abuse/tobacco abuse: -CIWA precautions>> cessation encouraged  5. CKD stage III: -Creatinine, 1.24 today with a baseline of 1.2-1.4 -Follow post cath>>hydrate pre-cath.    Signed, Ena Dawley, MD 10/15/2018, 7:09 AM     For questions or updates, please contact   Please consult [email or web address] for contact info under Cardiology/STEMI.

## 2018-10-15 NOTE — Progress Notes (Addendum)
Progress Note  Patient Name: Larry Pham Date of Encounter: 10/15/2018  Primary Cardiologist: Dr. Carlyle Dolly, MD  Subjective   Denies angina, shortness of breath.  Anticipate cardiac cath today, 10/15/2018  Inpatient Medications    Scheduled Meds:  aspirin EC  81 mg Oral Daily   atorvastatin  80 mg Oral Daily   cilostazol  50 mg Oral BID   ezetimibe  10 mg Oral Daily   folic acid  1 mg Oral Daily   LORazepam  0-4 mg Oral Q6H   Followed by   Derrill Memo ON 10/16/2018] LORazepam  0-4 mg Oral Q12H   metoprolol succinate  25 mg Oral Daily   multivitamin with minerals  1 tablet Oral Daily   pantoprazole  20 mg Oral Daily   sodium chloride flush  3 mL Intravenous Once   sodium chloride flush  3 mL Intravenous Q12H   thiamine  100 mg Oral Daily   Or   thiamine  100 mg Intravenous Daily   Continuous Infusions:  sodium chloride     sodium chloride 1 mL/kg/hr (10/15/18 0519)   heparin 1,450 Units/hr (10/15/18 0300)   sodium chloride Stopped (10/14/18 0235)   PRN Meds: sodium chloride, loperamide, LORazepam **OR** LORazepam, sodium chloride flush   Vital Signs    Vitals:   10/14/18 1526 10/14/18 2041 10/15/18 0418 10/15/18 0421  BP: (!) 146/85 (!) 154/96 (!) 166/99   Pulse: 87 76 70   Resp:   (!) 21   Temp: 98 F (36.7 C) 98.2 F (36.8 C) 98.4 F (36.9 C)   TempSrc: Oral Oral Oral   SpO2: 100% 96% 99%   Weight:    108.8 kg  Height:        Intake/Output Summary (Last 24 hours) at 10/15/2018 0709 Last data filed at 10/15/2018 0519 Gross per 24 hour  Intake 824.92 ml  Output 1125 ml  Net -300.08 ml   Filed Weights   10/13/18 2219 10/14/18 0309 10/15/18 0421  Weight: 111.1 kg 110.8 kg 108.8 kg    Physical Exam   General: Well developed, well nourished, NAD Skin: Warm, dry, intact  Neck: Negative for carotid bruits. No JVD Lungs:Clear to ausculation bilaterally. No wheezes, rales, or rhonchi. Breathing is unlabored. Cardiovascular: RRR  with S1 S2. No murmurs, rubs, gallops, or LV heave appreciated. Abdomen: Soft, non-tender, non-distended. No obvious abdominal masses. MSK: Strength and tone appear normal for age. 5/5 in all extremities Extremities: No edema. No clubbing or cyanosis. DP/PT pulses 2+ bilaterally. Bony prominence left foot Neuro: Alert and oriented. No focal deficits. No facial asymmetry. MAE spontaneously. Psych: Responds to questions appropriately with normal affect.    Labs    Chemistry Recent Labs  Lab 10/13/18 2233 10/15/18 0402  NA 135 139  K 3.7 4.1  CL 103 110  CO2 19* 23  GLUCOSE 88 99  BUN 13 12  CREATININE 1.38* 1.24  CALCIUM 9.0 8.7*  PROT  --  5.6*  ALBUMIN  --  3.3*  AST  --  28  ALT  --  40  ALKPHOS  --  64  BILITOT  --  0.4  GFRNONAA 58* >60  GFRAA >60 >60  ANIONGAP 13 6     Hematology Recent Labs  Lab 10/13/18 2233 10/15/18 0402  WBC 7.4 7.3  RBC 4.18* 4.04*  HGB 13.2 12.6*  HCT 39.3 37.8*  MCV 94.0 93.6  MCH 31.6 31.2  MCHC 33.6 33.3  RDW 13.6 13.2  PLT 235 218    Cardiac Enzymes Recent Labs  Lab 10/13/18 2233 10/14/18 1129 10/14/18 1718  TROPONINI <0.03 <0.03 <0.03   No results for input(s): TROPIPOC in the last 168 hours.   BNPNo results for input(s): BNP, PROBNP in the last 168 hours.   DDimer No results for input(s): DDIMER in the last 168 hours.   Radiology    Dg Chest 2 View  Result Date: 10/13/2018 CLINICAL DATA:  53 year old male with history of nonradiating chest pain that began 2 hours ago. EXAM: CHEST - 2 VIEW COMPARISON:  Chest x-ray 02/17/2016. FINDINGS: Lung volumes are normal. No consolidative airspace disease. No pleural effusions. No pneumothorax. No pulmonary nodule or mass noted. Pulmonary vasculature and the cardiomediastinal silhouette are within normal limits. Atherosclerosis in the thoracic aorta. IMPRESSION: 1.  No radiographic evidence of acute cardiopulmonary disease. 2. Aortic atherosclerosis. Electronically Signed   By:  Vinnie Langton M.D.   On: 10/13/2018 23:39   Ct Head Wo Contrast  Result Date: 10/14/2018 CLINICAL DATA:  53 year old male with history of generalized weakness. Chest pain. EXAM: CT HEAD WITHOUT CONTRAST TECHNIQUE: Contiguous axial images were obtained from the base of the skull through the vertex without intravenous contrast. COMPARISON:  Head CT 09/18/2015. FINDINGS: Brain: No evidence of acute infarction, hemorrhage, hydrocephalus, extra-axial collection or mass lesion/mass effect. Vascular: No hyperdense vessel or unexpected calcification. Skull: Normal. Negative for fracture or focal lesion. Sinuses/Orbits: No acute finding. Other: None. IMPRESSION: 1. No acute intracranial abnormalities. The appearance of the brain is normal for age. Electronically Signed   By: Vinnie Langton M.D.   On: 10/14/2018 02:12   Dg Ankle Left Port  Result Date: 10/14/2018 CLINICAL DATA:  Rheumatoid nodule EXAM: PORTABLE LEFT ANKLE - 2 VIEW COMPARISON:  None. FINDINGS: No fracture or dislocation of the left ankle. 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 No large ankle joint effusion. There is no radiographic evidence of erosive or inflammatory arthropathy. Moderate plantar calcaneal spur. IMPRESSION: No fracture or dislocation of the left ankle. 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 No large ankle joint effusion. There is no radiographic evidence of erosive or inflammatory arthropathy. Moderate plantar calcaneal spur. MRI is the test of choice to evaluate for inflammatory arthritis/synovitis and soft tissue complications of systemic inflammatory disease, per stated indication of rheumatoid nodule. Electronically Signed   By: Eddie Candle M.D.   On: 10/14/2018 16:32   Ct Angio Chest/abd/pel For Dissection W And/or Wo Contrast  Result Date: 10/14/2018 CLINICAL DATA:  53 year old male with history of nonradiating chest pain for the past 2 hours. Shortness of breath. Generalized weakness. EXAM: CT ANGIOGRAPHY CHEST,  ABDOMEN AND PELVIS TECHNIQUE: Multidetector CT imaging through the chest, abdomen and pelvis was performed using the standard protocol during bolus administration of intravenous contrast. Multiplanar reconstructed images and MIPs were obtained and reviewed to evaluate the vascular anatomy. CONTRAST:  16mL OMNIPAQUE IOHEXOL 350 MG/ML SOLN COMPARISON:  CTA of the chest, abdomen and pelvis 06/03/2017. FINDINGS: CTA CHEST FINDINGS Cardiovascular: Heart size is normal. There is no significant pericardial fluid, thickening or pericardial calcification. There is aortic atherosclerosis, as well as atherosclerosis of the great vessels of the mediastinum and the coronary arteries, including calcified atherosclerotic plaque in the left main, left anterior descending, left circumflex and right coronary arteries. No evidence of thoracic aortic aneurysm or dissection. Noncontrast images demonstrate no crescentic high attenuation associated with the wall of the thoracic aorta to suggest acute intramural hemorrhage. Mediastinum/Nodes: No pathologically enlarged mediastinal or hilar lymph  nodes. Densely calcified subcarinal lymph nodes incidentally noted. Esophagus is unremarkable in appearance. No axillary lymphadenopathy. Lungs/Pleura: Calcified granuloma in the lateral segment of the right middle lobe again noted. No other suspicious appearing pulmonary nodules or masses are noted. No acute consolidative airspace disease. No pleural effusions. Musculoskeletal: There are no aggressive appearing lytic or blastic lesions noted in the visualized portions of the skeleton. Review of the MIP images confirms the above findings. CTA ABDOMEN AND PELVIS FINDINGS VASCULAR Aorta: Normal caliber aorta without aneurysm, dissection, vasculitis or significant stenosis. Celiac: Patent without evidence of aneurysm, dissection, vasculitis or significant stenosis. SMA: Patent without evidence of aneurysm, dissection, vasculitis or significant  stenosis. Renals: Both renal arteries are patent without evidence of aneurysm, dissection, vasculitis, fibromuscular dysplasia or significant stenosis. IMA: Patent without evidence of aneurysm, dissection, vasculitis or significant stenosis. Inflow: Patent without evidence of aneurysm, dissection, vasculitis or significant stenosis. Veins: No obvious venous abnormality within the limitations of this arterial phase study. Review of the MIP images confirms the above findings. NON-VASCULAR Hepatobiliary: Severe diffuse low attenuation throughout the hepatic parenchyma, indicative of hepatic steatosis. No suspicious cystic or solid hepatic lesions. No intra or extrahepatic biliary ductal dilatation. Gallbladder is normal in appearance. Pancreas: No pancreatic mass. No pancreatic ductal dilatation. No pancreatic or peripancreatic fluid or inflammatory changes. Spleen: Unremarkable. Adrenals/Urinary Tract: Bilateral kidneys and bilateral adrenal glands are normal in appearance. No hydroureteronephrosis. Urinary bladder wall is diffusely thickened. Stomach/Bowel: Normal appearance of the stomach. No pathologic dilatation of small bowel or colon. Normal appendix. Lymphatic: Aortic atherosclerosis, without evidence of aneurysm or dissection in the abdominal or pelvic vasculature. No lymphadenopathy noted in the abdomen or pelvis. Reproductive: Severe median lobe hypertrophy of the prostate gland again noted. Seminal vesicles are unremarkable in appearance. Other: No significant volume of ascites.  No pneumoperitoneum. Musculoskeletal: There are no aggressive appearing lytic or blastic lesions noted in the visualized portions of the skeleton. Review of the MIP images confirms the above findings. IMPRESSION: 1. No findings to suggest acute aortic syndrome. No acute findings noted in the chest, abdomen or pelvis to account for the patient's symptoms. 2. Aortic atherosclerosis, in addition to left main and 3 vessel coronary  artery disease. Please note that although the presence of coronary artery calcium documents the presence of coronary artery disease, the severity of this disease and any potential stenosis cannot be assessed on this non-gated CT examination. Assessment for potential risk factor modification, dietary therapy or pharmacologic therapy may be warranted, if clinically indicated. 3. Severe hepatic steatosis. 4. Additional incidental findings, as above. Electronically Signed   By: Vinnie Langton M.D.   On: 10/14/2018 02:25   Telemetry    10/15/2018: NSR- Personally Reviewed  ECG    10/13/2018: NSR with TWI and inferior/lateral leads, HR 97- Personally Reviewed  Cardiac Studies   Echocardiogram 10/14/2018:  1. The left ventricle has hyperdynamic systolic function, with an ejection fraction of >65%. The cavity size was normal. Left ventricular diastolic parameters were normal.  2. The right ventricle has normal systolic function. The cavity was normal. There is no increase in right ventricular wall thickness.  3. Left atrial size was moderately dilated.  4. Right atrial size was mildly dilated.  5. Moderate thickening of the mitral valve leaflet.  6. The aortic valve is tricuspid.  Cardiac catheterization 04/22/2015:  Ost 1st Mrg to 1st Mrg lesion, 50% stenosed.   1. Moderate left main stenosis, not flow limiting by FFR (FFR of 0.93) 2. Patent stents mid  RCA with minimal restenosis.  3. Moderate distal RCA stenosis.  4. Moderate ostial LAD stenosis.  5. Severe stenosis small caliber distal AV groove Circumflex beyond the takeoff of the large OM branch. (too small for PCI) 6. Normal LV systolic function  Recommendations: He has moderate disease as described above with severe disease in the small caliber distal Circumflex. This vessel is too small for PCI. Continue medical management.   Patient Profile     53 y.o. male with a hx of CAD (inferior STEMI 2013 s/p PCI to RCA, DES to RCA 2014, last  cath 2016 managed medically), LE PAD (s/p prior occurrences of PTA, followed by Dr. 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 Maudie Mercury.  Assessment & Plan    1. Chest pain with history of CAD, concerning for unstable angina:  -EKG with new negative T waves in the inferior leads from prior tracings>> concerning for unstable angina given presentation -Had residual disease in 2016 including moderate LM stenosis -Troponin, negative x3, <0.03, <0.03, <0.03 -Continue heparin, ASA, BB as tolerated, and statin/Zetia.  -Given presenting symptoms and known history of CAD, plan for cardiac cath today, 10/15/2018  -If he requires PCI, will need to re-eval plan for cilostazol to avoid triple antiplatelet therapy.  2. Hypertension: -Elevated, 166/99, 154/96, 146/85  -Had issues with hypotension, diarrhea on presentation in which HCTZ and lisinopril are on hold, also in anticipation of cardiac cath -Will need further antihypertensive titration in the post-cath setting  3. Hyperlipidemia: -Last LDL, 58 on 10/15/2018 with goal of 70mg /dl -Continue current regimen  4. ETOH abuse/tobacco abuse: -CIWA precautions>> cessation encouraged  5. CKD stage III: -Creatinine, 1.24 today with a baseline of 1.2-1.4 -Follow post cath>>hydrate pre-cath.    Signed, Ena Dawley, MD 10/15/2018, 7:09 AM     For questions or updates, please contact   Please consult [email or web address] for contact info under Cardiology/STEMI.

## 2018-10-15 NOTE — Progress Notes (Signed)
TCTS consulted for CABG evaluation. °

## 2018-10-15 NOTE — Progress Notes (Signed)
ANTICOAGULATION CONSULT NOTE - Follow Up Consult  Pharmacy Consult for Heparin Indication: chest pain/ACS  Allergies  Allergen Reactions  . Imdur [Isosorbide Nitrate] Other (See Comments)    Made CP worse  . Bee Venom Swelling   Patient Measurements: Height: 5' 11.5" (181.6 cm) Weight: 239 lb 14.4 oz (108.8 kg) IBW/kg (Calculated) : 76.45 Heparin Dosing Weight: 100 kg  Vital Signs: Temp: 98.3 F (36.8 C) (06/08 1256) Temp Source: Oral (06/08 1256) BP: 188/102 (06/08 1654) Pulse Rate: 0 (06/08 1654)  Labs: Recent Labs    10/13/18 2233 10/14/18 1129 10/14/18 1718 10/14/18 1846 10/15/18 0402  HGB 13.2  --   --   --  12.6*  HCT 39.3  --   --   --  37.8*  PLT 235  --   --   --  218  APTT 29  --   --   --   --   LABPROT 13.9  --   --   --   --   INR 1.1  --   --   --   --   HEPARINUNFRC  --  0.29*  --  0.31 0.31  CREATININE 1.38*  --   --   --  1.24  TROPONINI <0.03 <0.03 <0.03  --   --    Estimated Creatinine Clearance: 86.1 mL/min (by C-G formula based on SCr of 1.24 mg/dL).  Assessment: 41 yoM presenting with unstable angina. Pharmacy has been consulted for heparin dosing. No AC PTA.  Underwent cardiac cath finding 60% LM and widely patent RCA - MD recommending CTVS evaluation. Plan to resume heparin 4 hours after sheath removal (documented on 6/8@1627 ). Was previously therapeutic this morning- rate at 1500 units/hr. Hgb 12.6, plt 218. No s/sx of bleeding.   Goal of Therapy:  Heparin level 0.3-0.7 units/ml Monitor platelets by anticoagulation protocol: Yes   Plan:  - Resume heparin drip at 1500 units/hr (15 ml/hr) on 6/8 at 2100 - Monitor heparin level with AM labs - Monitor daily HL, CBC, s/sx of bleeding, and CTVS plans  Thank you for allowing pharmacy to be a part of this patient's care.  Antonietta Jewel, PharmD, [place] Clinical Pharmacist  Pager: [phone] Phone: [phone] **Pharmacist phone directory can now be found on [email or web address] ([email or web address]).  Listed under [place].

## 2018-10-16 ENCOUNTER — Encounter (HOSPITAL_COMMUNITY): Payer: Self-pay | Admitting: Cardiovascular Disease

## 2018-10-16 ENCOUNTER — Inpatient Hospital Stay (HOSPITAL_COMMUNITY): Payer: BC Managed Care – PPO

## 2018-10-16 DIAGNOSIS — I739 Peripheral vascular disease, unspecified: Secondary | ICD-10-CM

## 2018-10-16 DIAGNOSIS — I959 Hypotension, unspecified: Secondary | ICD-10-CM

## 2018-10-16 DIAGNOSIS — Z01810 Encounter for preprocedural cardiovascular examination: Secondary | ICD-10-CM

## 2018-10-16 DIAGNOSIS — I25110 Atherosclerotic heart disease of native coronary artery with unstable angina pectoris: Secondary | ICD-10-CM

## 2018-10-16 LAB — BASIC METABOLIC PANEL
Anion gap: 9 (ref 5–15)
BUN: 12 mg/dL (ref 6–20)
CO2: 23 mmol/L (ref 22–32)
Calcium: 9.2 mg/dL (ref 8.9–10.3)
Chloride: 108 mmol/L (ref 98–111)
Creatinine, Ser: 1.15 mg/dL (ref 0.61–1.24)
GFR calc Af Amer: >60 mL/min (ref >60)
GFR calc non Af Amer: >60 mL/min (ref >60)
Glucose, Bld: 96 mg/dL (ref 70–99)
Potassium: 3.7 mmol/L (ref 3.5–5.1)
Sodium: 140 mmol/L (ref 135–145)

## 2018-10-16 LAB — CBC
HCT: 39.6 % (ref 39.0–52.0)
Hemoglobin: 13.3 g/dL (ref 13.0–17.0)
MCH: 31.1 pg (ref 26.0–34.0)
MCHC: 33.6 g/dL (ref 30.0–36.0)
MCV: 92.5 fL (ref 80.0–100.0)
Platelets: 215 10*3/uL (ref 150–400)
RBC: 4.28 MIL/uL (ref 4.22–5.81)
RDW: 13.4 % (ref 11.5–15.5)
WBC: 7.4 10*3/uL (ref 4.0–10.5)
nRBC: 0 % (ref 0.0–0.2)

## 2018-10-16 LAB — URINALYSIS, ROUTINE W REFLEX MICROSCOPIC
Bilirubin Urine: NEGATIVE
Glucose, UA: NEGATIVE mg/dL
Hgb urine dipstick: NEGATIVE
Ketones, ur: NEGATIVE mg/dL
Leukocytes,Ua: NEGATIVE
Nitrite: NEGATIVE
Protein, ur: NEGATIVE mg/dL
Specific Gravity, Urine: 1.024 (ref 1.005–1.030)
pH: 6 (ref 5.0–8.0)

## 2018-10-16 LAB — BLOOD GAS, ARTERIAL
Acid-Base Excess: 1 mmol/L (ref 0.0–2.0)
Bicarbonate: 24.8 mmol/L (ref 20.0–28.0)
FIO2: 21
O2 Saturation: 95.8 %
Patient temperature: 98.6
pCO2 arterial: 37.6 mmHg (ref 32.0–48.0)
pH, Arterial: 7.435 (ref 7.350–7.450)
pO2, Arterial: 81.6 mmHg — ABNORMAL LOW (ref 83.0–108.0)

## 2018-10-16 LAB — HEPARIN LEVEL (UNFRACTIONATED)
Heparin Unfractionated: 0.15 IU/mL — ABNORMAL LOW (ref 0.30–0.70)
Heparin Unfractionated: 0.27 IU/mL — ABNORMAL LOW (ref 0.30–0.70)

## 2018-10-16 LAB — HEMOGLOBIN A1C
Hgb A1c MFr Bld: 5.8 % — ABNORMAL HIGH (ref 4.8–5.6)
Mean Plasma Glucose: 119.76 mg/dL

## 2018-10-16 LAB — SURGICAL PCR SCREEN
MRSA, PCR: NEGATIVE
Staphylococcus aureus: NEGATIVE

## 2018-10-16 LAB — ABO/RH: ABO/RH(D): O NEG

## 2018-10-16 LAB — TYPE AND SCREEN
ABO/RH(D): O NEG
Antibody Screen: NEGATIVE

## 2018-10-16 MED ORDER — POTASSIUM CHLORIDE 2 MEQ/ML IV SOLN
80.0000 meq | INTRAVENOUS | Status: DC
  Filled 2018-10-16: qty 40

## 2018-10-16 MED ORDER — METOPROLOL TARTRATE 12.5 MG HALF TABLET
12.5000 mg | ORAL_TABLET | Freq: Once | ORAL | Status: AC
  Administered 2018-10-17: 12.5 mg via ORAL
  Filled 2018-10-16: qty 1

## 2018-10-16 MED ORDER — TRANEXAMIC ACID 1000 MG/10ML IV SOLN
1.5000 mg/kg/h | INTRAVENOUS | Status: AC
  Administered 2018-10-17: 1.5 mg/kg/h via INTRAVENOUS
  Filled 2018-10-16: qty 25

## 2018-10-16 MED ORDER — DIAZEPAM 5 MG PO TABS
5.0000 mg | ORAL_TABLET | Freq: Once | ORAL | Status: AC
  Administered 2018-10-17: 5 mg via ORAL
  Filled 2018-10-16: qty 1

## 2018-10-16 MED ORDER — EPINEPHRINE PF 1 MG/ML IJ SOLN
0.0000 ug/min | INTRAVENOUS | Status: DC
  Filled 2018-10-16: qty 4

## 2018-10-16 MED ORDER — PLASMA-LYTE 148 IV SOLN
INTRAVENOUS | Status: AC
  Administered 2018-10-17: 500 mL
  Filled 2018-10-16: qty 2.5

## 2018-10-16 MED ORDER — TRANEXAMIC ACID (OHS) PUMP PRIME SOLUTION
2.0000 mg/kg | INTRAVENOUS | Status: DC
  Filled 2018-10-16: qty 2.11

## 2018-10-16 MED ORDER — METOPROLOL SUCCINATE ER 50 MG PO TB24
50.0000 mg | ORAL_TABLET | Freq: Every day | ORAL | Status: DC
  Administered 2018-10-16: 50 mg via ORAL
  Filled 2018-10-16: qty 1

## 2018-10-16 MED ORDER — SODIUM CHLORIDE 0.9 % IV SOLN
INTRAVENOUS | Status: DC
  Filled 2018-10-16: qty 30

## 2018-10-16 MED ORDER — TRANEXAMIC ACID (OHS) BOLUS VIA INFUSION
15.0000 mg/kg | INTRAVENOUS | Status: AC
  Administered 2018-10-17: 1585.5 mg via INTRAVENOUS
  Filled 2018-10-16: qty 1586

## 2018-10-16 MED ORDER — CHLORHEXIDINE GLUCONATE CLOTH 2 % EX PADS
6.0000 | MEDICATED_PAD | Freq: Once | CUTANEOUS | Status: AC
  Administered 2018-10-16: 6 via TOPICAL

## 2018-10-16 MED ORDER — DOPAMINE-DEXTROSE 3.2-5 MG/ML-% IV SOLN
0.0000 ug/kg/min | INTRAVENOUS | Status: DC
  Filled 2018-10-16: qty 250

## 2018-10-16 MED ORDER — INSULIN REGULAR(HUMAN) IN NACL 100-0.9 UT/100ML-% IV SOLN
INTRAVENOUS | Status: AC
  Administered 2018-10-17: .7 [IU]/h via INTRAVENOUS
  Filled 2018-10-16: qty 100

## 2018-10-16 MED ORDER — NOREPINEPHRINE 4 MG/250ML-% IV SOLN
0.0000 ug/min | INTRAVENOUS | Status: DC
  Filled 2018-10-16: qty 250

## 2018-10-16 MED ORDER — PHENYLEPHRINE HCL-NACL 20-0.9 MG/250ML-% IV SOLN
30.0000 ug/min | INTRAVENOUS | Status: DC
  Filled 2018-10-16: qty 250

## 2018-10-16 MED ORDER — CHLORHEXIDINE GLUCONATE 0.12 % MT SOLN
15.0000 mL | Freq: Once | OROMUCOSAL | Status: AC
  Administered 2018-10-17: 15 mL via OROMUCOSAL
  Filled 2018-10-16: qty 15

## 2018-10-16 MED ORDER — DEXMEDETOMIDINE HCL IN NACL 400 MCG/100ML IV SOLN
0.1000 ug/kg/h | INTRAVENOUS | Status: AC
  Administered 2018-10-17: 12:00:00 0.7 ug/kg/h via INTRAVENOUS
  Filled 2018-10-16: qty 100

## 2018-10-16 MED ORDER — MAGNESIUM SULFATE 50 % IJ SOLN
40.0000 meq | INTRAMUSCULAR | Status: DC
  Filled 2018-10-16: qty 9.85

## 2018-10-16 MED ORDER — BISACODYL 5 MG PO TBEC
5.0000 mg | DELAYED_RELEASE_TABLET | Freq: Once | ORAL | Status: AC
  Administered 2018-10-16: 5 mg via ORAL
  Filled 2018-10-16: qty 1

## 2018-10-16 MED ORDER — VANCOMYCIN HCL 10 G IV SOLR
1500.0000 mg | INTRAVENOUS | Status: AC
  Administered 2018-10-17: 09:00:00 1500 mg via INTRAVENOUS
  Filled 2018-10-16: qty 1500

## 2018-10-16 MED ORDER — SODIUM CHLORIDE 0.9 % IV SOLN
1.5000 g | INTRAVENOUS | Status: AC
  Administered 2018-10-17: 1.5 g via INTRAVENOUS
  Filled 2018-10-16: qty 1.5

## 2018-10-16 MED ORDER — SODIUM CHLORIDE 0.9 % IV SOLN
750.0000 mg | INTRAVENOUS | Status: DC
  Filled 2018-10-16: qty 750

## 2018-10-16 MED ORDER — NITROGLYCERIN IN D5W 200-5 MCG/ML-% IV SOLN
2.0000 ug/min | INTRAVENOUS | Status: DC
  Filled 2018-10-16: qty 250

## 2018-10-16 MED ORDER — MILRINONE LACTATE IN DEXTROSE 20-5 MG/100ML-% IV SOLN
0.3000 ug/kg/min | INTRAVENOUS | Status: DC
  Filled 2018-10-16: qty 100

## 2018-10-16 MED ORDER — CHLORHEXIDINE GLUCONATE CLOTH 2 % EX PADS
6.0000 | MEDICATED_PAD | Freq: Once | CUTANEOUS | Status: AC
  Administered 2018-10-17: 6 via TOPICAL

## 2018-10-16 NOTE — Consult Note (Signed)
Reason for Consult:Left main disease Referring Physician: Dr. Gwenlyn Found, Dr. Fletcher Anon primary Cards  Larry Pham is an 53 y.o. male.  HPI: Larry Pham is a 53 year old gentleman who presented with a chief complaint of chest pain.  Larry Pham is a 53 year old man with a history of coronary disease, MI, RCA stent, carotid disease, peripheral arterial disease, hypertension, hyperlipidemia, tobacco abuse, remote CVA, and history of ethanol abuse.  He had a drug-eluting stent placed in his right coronary artery in 2013 for an inferior STEMI.  He had a second stent placed in the right coronary in 2014.  His most recent cath was in December 2016.  He had 40% distal left main stenosis at that time.  Over the past couple of weeks he has noted exertional substernal chest pressure.  He walks about a mile from the parking lot to where he works and he would have to stop 2 or 3 times.  He had been using nitroglycerin occasionally.  On 10/14/2018 at approximately 7:53 PM he developed substernal chest pressure at rest he did have some relief with sublingual nitroglycerin but pain did not completely resolve.  He came to the emergency room and was admitted.  He ruled out for MI.  Yesterday he underwent cardiac catheterization where he was found to have a 65% distal left main stenosis.  There was some diffuse disease in the right coronary, his stents were patent but there was approximately 50% stenosis in the mid right coronary.   Past Medical History:  Diagnosis Date  . CAD (coronary artery disease)    a. 2013 Inf STEMI s/p RCA DES. b. DES to RCA 2014 at [facility]. c. 04/2015 Cath: LM 40, LAD 40ost, 54m, D1 30, LCX 69m, OM1 50, RCA 30p, 20/21m, 50d. EF 55-65%.  . Carotid arterial disease ([place])    a. 07/2015 Carotid U/S: <50% bilat.  . Carpal tunnel syndrome   . CKD (chronic kidney disease), stage III ([place])   . CVA (cerebral vascular accident) ([place]) 12/09/2014   MRI brain=> remote infarcts seen in pons and left thalamus    . GERD (gastroesophageal reflux disease)   . Hepatic steatosis   . History of ETOH abuse   . Hyperlipidemia   . Hypertension   . MI (myocardial infarction) ([place]) 02-2012  . PAD (peripheral artery disease) ([place])    a. 06/2015 s/p L SFA stenting; b. 12/2016 PV Angio: R SFA 40-54m, L SFA patent stent-->Med Rx, cillostazol added.  . Seasonal allergies     Past Surgical History:  Procedure Laterality Date  . ABDOMINAL AORTOGRAM W/LOWER EXTREMITY N/A 01/04/2017   Procedure: ABDOMINAL AORTOGRAM W/LOWER EXTREMITY;  Surgeon: Wellington Hampshire, MD;  Location: [place] [facility];  Service: Cardiovascular;  Laterality: N/A;  . CARDIAC CATHETERIZATION N/A 04/22/2015   Procedure: Left Heart Cath and Coronary Angiography;  Surgeon: Burnell Blanks, MD;  Location: [place] [facility];  Service: Cardiovascular;  Laterality: N/A;  . CARDIAC CATHETERIZATION N/A 04/22/2015   Procedure: Intravascular Pressure Wire/FFR Study;  Surgeon: Burnell Blanks, MD;  Location: [place] [facility];  Service: Cardiovascular;  Laterality: N/A;  . COLONOSCOPY WITH PROPOFOL N/A 03/23/2015   Procedure: COLONOSCOPY WITH PROPOFOL;  Surgeon: Daneil Dolin, MD;  Location: [facility];  Service: Endoscopy;  Laterality: N/A;  [place] time in 0849  time out  0959  total time 10 mintues  . CORONARY ANGIOPLASTY    . CORONARY ARTERY CATHETERIZATION WITH STENTING N/A 02-2102 AND 05-2012  . LEFT FOREARM SURGERY  s/p machine accident  . LEFT HEART CATH AND CORONARY ANGIOGRAPHY N/A 10/15/2018   Procedure: LEFT HEART CATH AND CORONARY ANGIOGRAPHY;  Surgeon: Lorretta Harp, MD;  Location: [place] [facility];  Service: Cardiovascular;  Laterality: N/A;  . LOWER EXTREMITY ANGIOGRAM Bilateral 07/01/2015   Procedure: Lower Extremity Angiogram;  Surgeon: Wellington Hampshire, MD;  Location: [place] [facility];  Service: Cardiovascular;  Laterality: Bilateral;  . PERIPHERAL VASCULAR CATHETERIZATION N/A 07/01/2015   Procedure: Abdominal  Aortogram;  Surgeon: Wellington Hampshire, MD;  Location: [place] [facility];  Service: Cardiovascular;  Laterality: N/A;  . PERIPHERAL VASCULAR CATHETERIZATION Left 07/01/2015   Procedure: Peripheral Vascular Intervention;  Surgeon: Wellington Hampshire, MD;  Location: [place] [facility];  Service: Cardiovascular;  Laterality: Left;  SFA  . POLYPECTOMY N/A 03/23/2015   Procedure: POLYPECTOMY;  Surgeon: Daneil Dolin, MD;  Location: [facility];  Service: Endoscopy;  Laterality: N/A;  cecal, descending colon  . TRIGGER FINGER RELEASE     right 5th digit    Family History  Problem Relation Age of Onset  . Hypertension Mother   . Heart disease Mother   . Cirrhosis Father        DIED AT AGE 81  . Breast cancer Paternal Grandmother        DECEASED  . Colon cancer Neg Hx     Social History:  reports that he has been smoking cigarettes. He started smoking about 40 years ago. He has a 30.00 pack-year smoking history. He has never used smokeless tobacco. He reports current alcohol use. He reports that he does not use drugs.  Allergies:  Allergies  Allergen Reactions  . Imdur [Isosorbide Nitrate] Other (See Comments)    Made CP worse  . Bee Venom Swelling    Medications:  Scheduled: . aspirin  81 mg Oral Daily  . atorvastatin  80 mg Oral Daily  . cilostazol  50 mg Oral BID  . ezetimibe  10 mg Oral Daily  . folic acid  1 mg Oral Daily  . lisinopril  40 mg Oral Daily  . LORazepam  0-4 mg Oral Q12H  . metoprolol succinate  50 mg Oral Daily  . multivitamin with minerals  1 tablet Oral Daily  . pantoprazole  20 mg Oral Daily  . sodium chloride flush  3 mL Intravenous Once  . sodium chloride flush  3 mL Intravenous Q12H  . sodium chloride flush  3 mL Intravenous Q12H  . thiamine  100 mg Oral Daily   Or  . thiamine  100 mg Intravenous Daily    Results for orders placed or performed during the hospital encounter of 10/13/18 (from the past 48 hour(s))  Heparin level (unfractionated)     Status:  Abnormal   Collection Time: 10/14/18 11:29 AM  Result Value Ref Range   Heparin Unfractionated 0.29 (L) 0.30 - 0.70 IU/mL    Comment: (NOTE) If heparin results are below expected values, and patient dosage has  been confirmed, suggest follow up testing of antithrombin III levels. Performed at [facility], [place] [address] [place], [place] [address]   Troponin I - Now Then Q6H     Status: None   Collection Time: 10/14/18 11:29 AM  Result Value Ref Range   Troponin I <0.03 <0.03 ng/mL    Comment: Performed at [place] [address] [place],  [address]  Troponin I - Now Then Q6H     Status: None  Collection Time: 10/14/18  5:18 PM  Result Value Ref Range   Troponin I <0.03 <0.03 ng/mL    Comment: Performed at [facility], [place] [address], [place], [place] [address]  Heparin level (unfractionated)     Status: None   Collection Time: 10/14/18  6:46 PM  Result Value Ref Range   Heparin Unfractionated 0.31 0.30 - 0.70 IU/mL    Comment: (NOTE) If heparin results are below expected values, and patient dosage has  been confirmed, suggest follow up testing of antithrombin III levels. Performed at [facility], [place] [address] [place], [place] [address]   Heparin level (unfractionated)     Status: None   Collection Time: 10/15/18  4:02 AM  Result Value Ref Range   Heparin Unfractionated 0.31 0.30 - 0.70 IU/mL    Comment: (NOTE) If heparin results are below expected values, and patient dosage has  been confirmed, suggest follow up testing of antithrombin III levels. Performed at [facility], [place] [address], [place], [place] [address]   CBC     Status: Abnormal   Collection Time: 10/15/18  4:02 AM  Result Value Ref Range   WBC 7.3 4.0 - 10.5 K/uL   RBC 4.04 (L) 4.22 - 5.81 MIL/uL   Hemoglobin 12.6 (L) 13.0 - 17.0 g/dL   HCT 37.8 (L) 39.0 - 52.0 %   MCV 93.6 80.0 - 100.0 fL   MCH 31.2 26.0 - 34.0 pg   MCHC 33.3 30.0 - 36.0 g/dL   RDW  13.2 11.5 - 15.5 %   Platelets 218 150 - 400 K/uL   nRBC 0.0 0.0 - 0.2 %    Comment: Performed at [facility], [place] [address], [place], [place] [address]  Lipid panel     Status: None   Collection Time: 10/15/18  4:02 AM  Result Value Ref Range   Cholesterol 118 0 - 200 mg/dL   Triglycerides 60 <150 mg/dL   HDL 48 >40 mg/dL   Total CHOL/HDL Ratio 2.5 RATIO   VLDL 12 0 - 40 mg/dL   LDL Cholesterol 58 0 - 99 mg/dL    Comment:        Total Cholesterol/HDL:CHD Risk Coronary Heart Disease Risk Table                     Men   Women  1/2 Average Risk   3.4   3.3  Average Risk       5.0   4.4  2 X Average Risk   9.6   7.1  3 X Average Risk  23.4   11.0        Use the calculated Patient Ratio above and the CHD Risk Table to determine the patient's CHD Risk.        ATP III CLASSIFICATION (LDL):  <100     mg/dL   Optimal  100-129  mg/dL   Near or Above                    Optimal  130-159  mg/dL   Borderline  160-189  mg/dL   High  >190     mg/dL   Very High Performed at [place] [facility] [address] [place], [place] [address]   Basic metabolic panel     Status: Abnormal   Collection Time: 10/15/18  4:02 AM  Result Value Ref Range   Sodium 139 135 - 145 mmol/L  Potassium 4.1 3.5 - 5.1 mmol/L   Chloride 110 98 - 111 mmol/L   CO2 23 22 - 32 mmol/L   Glucose, Bld 99 70 - 99 mg/dL   BUN 12 6 - 20 mg/dL   Creatinine, Ser 1.24 0.61 - 1.24 mg/dL   Calcium 8.7 (L) 8.9 - 10.3 mg/dL   GFR calc non Af Amer >60 >60 mL/min   GFR calc Af Amer >60 >60 mL/min   Anion gap 6 5 - 15    Comment: Performed at [facility] [address], [place], [place] [address]  Hepatic function panel     Status: Abnormal   Collection Time: 10/15/18  4:02 AM  Result Value Ref Range   Total Protein 5.6 (L) 6.5 - 8.1 g/dL   Albumin 3.3 (L) 3.5 - 5.0 g/dL   AST 28 15 - 41 U/L   ALT 40 0 - 44 U/L   Alkaline Phosphatase 64 38 - 126 U/L   Total Bilirubin 0.4 0.3 - 1.2 mg/dL   Bilirubin,  Direct <0.1 0.0 - 0.2 mg/dL   Indirect Bilirubin NOT CALCULATED 0.3 - 0.9 mg/dL    Comment: Performed at [facility] [address] [place], [place] [address]  CBC     Status: None   Collection Time: 10/16/18  3:34 AM  Result Value Ref Range   WBC 7.4 4.0 - 10.5 K/uL   RBC 4.28 4.22 - 5.81 MIL/uL   Hemoglobin 13.3 13.0 - 17.0 g/dL   HCT 39.6 39.0 - 52.0 %   MCV 92.5 80.0 - 100.0 fL   MCH 31.1 26.0 - 34.0 pg   MCHC 33.6 30.0 - 36.0 g/dL   RDW 13.4 11.5 - 15.5 %   Platelets 215 150 - 400 K/uL   nRBC 0.0 0.0 - 0.2 %    Comment: Performed at [facility], [place] [address] [place], [place] [address]  Basic metabolic panel     Status: None   Collection Time: 10/16/18  3:34 AM  Result Value Ref Range   Sodium 140 135 - 145 mmol/L   Potassium 3.7 3.5 - 5.1 mmol/L   Chloride 108 98 - 111 mmol/L   CO2 23 22 - 32 mmol/L   Glucose, Bld 96 70 - 99 mg/dL   BUN 12 6 - 20 mg/dL   Creatinine, Ser 1.15 0.61 - 1.24 mg/dL   Calcium 9.2 8.9 - 10.3 mg/dL   GFR calc non Af Amer >60 >60 mL/min   GFR calc Af Amer >60 >60 mL/min   Anion gap 9 5 - 15    Comment: Performed at [facility], [place] [address] [place], [place] [address]  Heparin level (unfractionated)     Status: Abnormal   Collection Time: 10/16/18  3:34 AM  Result Value Ref Range   Heparin Unfractionated 0.15 (L) 0.30 - 0.70 IU/mL    Comment: (NOTE) If heparin results are below expected values, and patient dosage has  been confirmed, suggest follow up testing of antithrombin III levels. Performed at [facility], [place] [address], [place], [place] [address]     Dg Ankle Left Port  Result Date: 10/14/2018 CLINICAL DATA:  Rheumatoid nodule EXAM: PORTABLE LEFT ANKLE - 2 VIEW COMPARISON:  None. FINDINGS: No fracture or dislocation of the left ankle. 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 No large ankle joint effusion. There is no radiographic evidence of erosive or inflammatory arthropathy. Moderate plantar  calcaneal spur.  IMPRESSION: No fracture or dislocation of the left ankle. 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 No large ankle joint effusion. There is no radiographic evidence of erosive or inflammatory arthropathy. Moderate plantar calcaneal spur. MRI is the test of choice to evaluate for inflammatory arthritis/synovitis and soft tissue complications of systemic inflammatory disease, per stated indication of rheumatoid nodule. Electronically Signed   By: Eddie Candle M.D.   On: 10/14/2018 16:32    Review of Systems  Constitutional: Positive for malaise/fatigue. Negative for chills and fever.  Eyes: Negative for blurred vision and double vision.  Respiratory: Positive for shortness of breath. Negative for cough and sputum production.   Cardiovascular: Positive for chest pain and claudication (right > left).  Gastrointestinal: Negative for nausea and vomiting.  Genitourinary: Negative for dysuria and urgency.  Neurological: Negative for loss of consciousness and weakness.  All other systems reviewed and are negative.  Blood pressure (!) 150/89, pulse 71, temperature 98.2 F (36.8 C), temperature source Oral, resp. rate (!) 0, height 5' 11.5" (1.816 m), weight 105.7 kg, SpO2 98 %. Physical Exam  Vitals reviewed. Constitutional: He is oriented to person, place, and time. He appears well-developed and well-nourished.  HENT:  Head: Normocephalic and atraumatic.  Mouth/Throat: No oropharyngeal exudate.  Eyes: Conjunctivae and EOM are normal. No scleral icterus.  Neck: No thyromegaly present.  No carotrid bruit  Cardiovascular: Normal rate, regular rhythm, S1 normal and S2 normal.  No murmur heard. Pulses:      Carotid pulses are 2+ on the right side and 2+ on the left side.      Dorsalis pedis pulses are 2+ on the right side and 2+ on the left side.       Posterior tibial pulses are 0 on the right side and 2+ on the left side.  Respiratory: Effort normal and breath sounds normal. He has  no wheezes. He has no rales.  GI: Soft. Bowel sounds are normal. He exhibits no distension. There is no abdominal tenderness.  Musculoskeletal:        General: Deformity (firm nodule left medial ankle) present. No edema.  Lymphadenopathy:    He has no cervical adenopathy.  Neurological: He is alert and oriented to person, place, and time. No cranial nerve deficit. He exhibits normal muscle tone. Coordination normal.  Skin: Skin is warm and dry.   CARDIAC CATHETERIZATION Coronary Findings   Diagnostic  Dominance: Right  Left Main  Ost LM to Mid LM lesion 60% stenosed  Ost LM to Mid LM lesion is 60% stenosed.  Right Coronary Artery  Prox RCA to Mid RCA lesion 25% stenosed  Prox RCA to Mid RCA lesion is 25% stenosed. The lesion was previously treated.  Mid RCA-1 lesion 50% stenosed  Mid RCA-1 lesion is 50% stenosed.  Mid RCA-2 lesion 25% stenosed  Mid RCA-2 lesion is 25% stenosed. The lesion was previously treated.  Dist RCA lesion 40% stenosed  Dist RCA lesion is 40% stenosed.   I personally reviewed the cath images and concur with the findings noted above.  Assessment/Plan: Larry Pham is a 53 year old man with history of hypertension, hyperlipidemia, tobacco abuse, coronary artery disease, MI, previous stenting of his right coronary, remote CVA, nonocclusive carotid stenosis, peripheral arterial disease with previous stenting, and a history of ethanol abuse.  He presented with an unstable coronary syndrome and was found to have a hemodynamically significant distal left main lesion is not amenable to percutaneous intervention.  He is referred for coronary artery bypass grafting.  Coronary artery bypass grafting is indicated for survival benefit and relief of symptoms.    I discussed the general nature of the procedure, including the need for general anesthesia, the use of cardiopulmonary bypass and the incisions to be used with Larry Pham. We discussed the expected hospital stay,  overall recovery and short and long term outcomes.  I informed him of the indications, risks, benefits, and alternatives.  They understand the risks include, but are not limited to death, stroke, MI, DVT/PE, bleeding, possible need for transfusion, infections, cardiac arrhythmias, as well as other organ system dysfunction including respiratory, renal, or GI complications.   He accepts the risks and agrees to proceed.  Larry Pham 10/16/2018, 9:18 AM

## 2018-10-16 NOTE — Plan of Care (Signed)
  Problem: Clinical Measurements: Goal: Will remain free from infection Outcome: Progressing   Problem: Activity: Goal: Risk for activity intolerance will decrease Outcome: Progressing   Problem: Nutrition: Goal: Adequate nutrition will be maintained Outcome: Progressing   Problem: Coping: Goal: Level of anxiety will decrease Outcome: Progressing   Problem: Elimination: Goal: Will not experience complications related to bowel motility Outcome: Progressing   Problem: Pain Managment: Goal: General experience of comfort will improve Outcome: Progressing   Problem: Safety: Goal: Ability to remain free from injury will improve Outcome: Progressing   Problem: Skin Integrity: Goal: Risk for impaired skin integrity will decrease Outcome: Progressing   Problem: Cardiovascular: Goal: Vascular access site(s) Level 0-1 will be maintained Outcome: Progressing

## 2018-10-16 NOTE — Progress Notes (Signed)
1040-1110 Gave pt IS and he demonstrated 1250 ml correctly with instruction. Discussed with pt the importance of IS and mobility after surgery. Gave pt in the tube handout and discussed sternal precautions. Demonstrated to pt how to get up without straining breast bone. Wrote down how to view pre op video. Gave [facility] booklet and care guide. Pt stated he has family available to help after discharge.  Graylon Good RN BSN 10/16/2018 11:13 AM

## 2018-10-16 NOTE — Progress Notes (Signed)
PROGRESS NOTE    Larry Pham  WNU:[id number] DOB: [birth year] DOA: 10/13/2018 PCP: Barry Dienes, NP    Brief Narrative: Larry Pham a52 y.o.male,w hypertension, hyperlipidemia, CADw STEMI 02/09/2012 w PCI to RCA [facility], [place]), s/p DES to RCA 2014 [facility]), s/p cath 04/22/2015 ([facility])=> severe stenosis distal AV groove cirumflex after takeoff of large OM branch (too small for PCI), PADs/p stent L SFA 07/01/2015, h/o CVA (pons/ left thalamus on prior MRI brain 2016), Gerd, h/o Etoh abuse, h/o colonic polyps (tubular adenoma, 03/23/2015), Tobacco abuse apparently presents with c/o chest pain  Concerning for unstable angina.  Assessment & Plan:   Principal Problem:   Chest pain Active Problems:   CAD (coronary artery disease), native coronary artery   Tobacco use   Essential hypertension   Hypotension   Unstable angina ([place])   Unstable angina: troponis negative. His chest pain is resolved.  He is on IV heparin and underwent cardiac cath showing    Prox RCA to Mid RCA lesion is 25% stenosed.  Mid RCA-1 lesion is 50% stenosed.  Mid RCA-2 lesion is 25% stenosed.  Dist RCA lesion is 40% stenosed.  Ost LM to Mid LM lesion is 60% stenosed.  He will need CTVS evaluation for CABG.  Meanwhile Continue with aspirin, BB and statin.     Essential hypertension:  Well controlled.    Hyperlipidemia:  Resume statin.    Stage 3 CKD: Creatinine at baseline.   ETOH abuse:  On CIWA.   Left ankle nodule:  MRI of the left ankle ordered. Some tenderness noted.    DVT prophylaxis: heparin  Code Status: full code.  Family Communication: none at bedside.  Disposition Plan: pending cath.    Consultants:   Cardiology  CTVS    Procedures: cardiac catheterization onf 10/15/2018   Antimicrobials: none.    Subjective: No chest pain today. Or sob.   Objective: Vitals:   10/15/18 2038 10/16/18 0454 10/16/18 0456 10/16/18 0458  BP: 132/76 (!) 161/86 (!) 150/89    Pulse: 86 72 71   Resp:      Temp: 98.6 F (37 C) 98.2 F (36.8 C)    TempSrc: Oral Oral    SpO2: 96% 94% 98%   Weight:    105.7 kg  Height:        Intake/Output Summary (Last 24 hours) at 10/16/2018 1354 Last data filed at 10/16/2018 0458 Gross per 24 hour  Intake 2315.07 ml  Output 3525 ml  Net -1209.93 ml   Filed Weights   10/14/18 0309 10/15/18 0421 10/16/18 0458  Weight: 110.8 kg 108.8 kg 105.7 kg    Examination:  General exam: Appears calm and comfortable  Respiratory system: Clear to auscultation. Respiratory effort normal. Cardiovascular system: S1 & S2 heard, RRR. No JVD. No pedal edema. Gastrointestinal system: Abdomen is nondistended, soft and nontender. No organomegaly or masses felt. Normal bowel sounds heard. Central nervous system: Alert and oriented. No focal neurological deficits. Extremities: Symmetric 5 x 5 power. Skin: No rashes, lesions or ulcers Psychiatry: . Mood & affect appropriate.     Data Reviewed: I have personally reviewed following labs and imaging studies  CBC: Recent Labs  Lab 10/13/18 2233 10/15/18 0402 10/16/18 0334  WBC 7.4 7.3 7.4  HGB 13.2 12.6* 13.3  HCT 39.3 37.8* 39.6  MCV 94.0 93.6 92.5  PLT 235 218 034   Basic Metabolic Panel: Recent Labs  Lab 10/13/18 2233 10/15/18 0402 10/16/18 0334  NA 135 139 140  K 3.7 4.1 3.7  CL 103 110 108  CO2 19* 23 23  GLUCOSE 88 99 96  BUN 13 12 12   CREATININE 1.38* 1.24 1.15  CALCIUM 9.0 8.7* 9.2   GFR: Estimated Creatinine Clearance: 91.6 mL/min (by C-G formula based on SCr of 1.15 mg/dL). Liver Function Tests: Recent Labs  Lab 10/15/18 0402  AST 28  ALT 40  ALKPHOS 64  BILITOT 0.4  PROT 5.6*  ALBUMIN 3.3*   No results for input(s): LIPASE, AMYLASE in the last 168 hours. No results for input(s): AMMONIA in the last 168 hours. Coagulation Profile: Recent Labs  Lab 10/13/18 2233  INR 1.1   Cardiac Enzymes: Recent Labs  Lab 10/13/18 2233 10/14/18 1129 10/14/18  1718  TROPONINI <0.03 <0.03 <0.03   BNP (last 3 results) No results for input(s): PROBNP in the last 8760 hours. HbA1C: No results for input(s): HGBA1C in the last 72 hours. CBG: No results for input(s): GLUCAP in the last 168 hours. Lipid Profile: Recent Labs    10/15/18 0402  CHOL 118  HDL 48  LDLCALC 58  TRIG 60  CHOLHDL 2.5   Thyroid Function Tests: No results for input(s): TSH, T4TOTAL, FREET4, T3FREE, THYROIDAB in the last 72 hours. Anemia Panel: No results for input(s): VITAMINB12, FOLATE, FERRITIN, TIBC, IRON, RETICCTPCT in the last 72 hours. Sepsis Labs: No results for input(s): PROCALCITON, LATICACIDVEN in the last 168 hours.  Recent Results (from the past 240 hour(s))  SARS Coronavirus 2 (CEPHEID - Performed in [place] hospital lab), Hosp Order     Status: None   Collection Time: 10/13/18 11:41 PM  Result Value Ref Range Status   SARS Coronavirus 2 NEGATIVE NEGATIVE Final    Comment: (NOTE) If result is NEGATIVE SARS-CoV-2 target nucleic acids are NOT DETECTED. The SARS-CoV-2 RNA is generally detectable in upper and lower  respiratory specimens during the acute phase of infection. The lowest  concentration of SARS-CoV-2 viral copies this assay can detect is 250  copies / mL. A negative result does not preclude SARS-CoV-2 infection  and should not be used as the sole basis for treatment or other  patient management decisions.  A negative result may occur with  improper specimen collection / handling, submission of specimen other  than nasopharyngeal swab, presence of viral mutation(s) within the  areas targeted by this assay, and inadequate number of viral copies  (<250 copies / mL). A negative result must be combined with clinical  observations, patient history, and epidemiological information. If result is POSITIVE SARS-CoV-2 target nucleic acids are DETECTED. The SARS-CoV-2 RNA is generally detectable in upper and lower  respiratory specimens dur ing  the acute phase of infection.  Positive  results are indicative of active infection with SARS-CoV-2.  Clinical  correlation with patient history and other diagnostic information is  necessary to determine patient infection status.  Positive results do  not rule out bacterial infection or co-infection with other viruses. If result is PRESUMPTIVE POSTIVE SARS-CoV-2 nucleic acids MAY BE PRESENT.   A presumptive positive result was obtained on the submitted specimen  and confirmed on repeat testing.  While 2019 novel coronavirus  (SARS-CoV-2) nucleic acids may be present in the submitted sample  additional confirmatory testing may be necessary for epidemiological  and / or clinical management purposes  to differentiate between  SARS-CoV-2 and other Sarbecovirus currently known to infect humans.  If clinically indicated additional testing with an alternate test  methodology [phone]) is advised. The SARS-CoV-2 RNA is generally  detectable in upper  and lower respiratory sp ecimens during the acute  phase of infection. The expected result is Negative. Fact Sheet for Patients:  StrictlyIdeas.no Fact Sheet for Healthcare Providers: BankingDealers.co.za This test is not yet approved or cleared by the [place] FDA and has been authorized for detection and/or diagnosis of SARS-CoV-2 by FDA under an Emergency Use Authorization (EUA).  This EUA will remain in effect (meaning this test can be used) for the duration of the COVID-19 declaration under Section 564(b)(1) of the Act, 21 U.S.C. section 360bbb-3(b)(1), unless the authorization is terminated or revoked sooner. Performed at [facility], [address] [place], [place] [address]          Radiology Studies: Dg Ankle Left Port  Result Date: 10/14/2018 CLINICAL DATA:  Rheumatoid nodule EXAM: PORTABLE LEFT ANKLE - 2 VIEW COMPARISON:  None. FINDINGS: No fracture or dislocation of the left  ankle. 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 No large ankle joint effusion. There is no radiographic evidence of erosive or inflammatory arthropathy. Moderate plantar calcaneal spur. IMPRESSION: No fracture or dislocation of the left ankle. 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 No large ankle joint effusion. There is no radiographic evidence of erosive or inflammatory arthropathy. Moderate plantar calcaneal spur. MRI is the test of choice to evaluate for inflammatory arthritis/synovitis and soft tissue complications of systemic inflammatory disease, per stated indication of rheumatoid nodule. Electronically Signed   By: Eddie Candle M.D.   On: 10/14/2018 16:32        Scheduled Meds: . aspirin  81 mg Oral Daily  . atorvastatin  80 mg Oral Daily  . bisacodyl  5 mg Oral Once  . [START ON 10/17/2018] chlorhexidine  15 mL Mouth/Throat Once  . Chlorhexidine Gluconate Cloth  6 each Topical Once   And  . [START ON 10/17/2018] Chlorhexidine Gluconate Cloth  6 each Topical Once  . cilostazol  50 mg Oral BID  . [START ON 10/17/2018] diazepam  5 mg Oral Once  . ezetimibe  10 mg Oral Daily  . folic acid  1 mg Oral Daily  . [START ON 10/17/2018] heparin-papaverine-plasmalyte irrigation   Irrigation To OR  . [START ON 10/17/2018] insulin   Intravenous To OR  . lisinopril  40 mg Oral Daily  . LORazepam  0-4 mg Oral Q12H  . [START ON 10/17/2018] magnesium sulfate  40 mEq Other To OR  . metoprolol succinate  50 mg Oral Daily  . [START ON 10/17/2018] metoprolol tartrate  12.5 mg Oral Once  . multivitamin with minerals  1 tablet Oral Daily  . pantoprazole  20 mg Oral Daily  . [START ON 10/17/2018] phenylephrine  30-200 mcg/min Intravenous To OR  . [START ON 10/17/2018] potassium chloride  80 mEq Other To OR  . sodium chloride flush  3 mL Intravenous Once  . sodium chloride flush  3 mL Intravenous Q12H  . sodium chloride flush  3 mL Intravenous Q12H  . thiamine  100 mg Oral Daily   Or  . thiamine  100  mg Intravenous Daily  . [START ON 10/17/2018] tranexamic acid  15 mg/kg Intravenous To OR  . [START ON 10/17/2018] tranexamic acid  2 mg/kg Intracatheter To OR   Continuous Infusions: . sodium chloride    . [START ON 10/17/2018] cefUROXime (ZINACEF)  IV    . [START ON 10/17/2018] cefUROXime (ZINACEF)  IV    . [START ON 10/17/2018] dexmedetomidine    . [START ON 10/17/2018] DOPamine    . [START ON 10/17/2018]  epinephrine    . [START ON 10/17/2018] heparin 30,000 units/NS 1000 mL solution for CELLSAVER    . heparin 1,650 Units/hr (10/16/18 0946)  . [START ON 10/17/2018] milrinone    . [START ON 10/17/2018] nitroGLYCERIN    . [START ON 10/17/2018] norepinephrine    . sodium chloride Stopped (10/14/18 0235)  . [START ON 10/17/2018] tranexamic acid (CYKLOKAPRON) infusion ([facility])    . [START ON 10/17/2018] vancomycin       LOS: 1 day    Time spent: 26 minutes    Hosie Poisson, MD [facility] Pager [phone]  If 7PM-7AM, please contact night-coverage [email or web address] Password [email or web address] 10/16/2018, 1:54 PM

## 2018-10-16 NOTE — Progress Notes (Signed)
Pre CABG Doppler evaluation completed. Preliminary resuls in Chart review CV Proc. [facility], [place] 10/16/2018, 6:14 PM

## 2018-10-16 NOTE — Progress Notes (Signed)
ANTICOAGULATION CONSULT NOTE - Follow Up Consult  Pharmacy Consult for Heparin Indication: chest pain/ACS  Allergies  Allergen Reactions  . Imdur [Isosorbide Nitrate] Other (See Comments)    Made CP worse  . Bee Venom Swelling   Patient Measurements: Height: 5' 11.5" (181.6 cm) Weight: 233 lb (105.7 kg) IBW/kg (Calculated) : 76.45 Heparin Dosing Weight: 98.6 kg  Vital Signs: Temp: 98.2 F (36.8 C) (06/09 0454) Temp Source: Oral (06/09 0454) BP: 150/89 (06/09 0456) Pulse Rate: 71 (06/09 0456)  Labs: Recent Labs    10/13/18 2233  10/14/18 1129 10/14/18 1718 10/14/18 1846 10/15/18 0402 10/16/18 0334  HGB 13.2  --   --   --   --  12.6* 13.3  HCT 39.3  --   --   --   --  37.8* 39.6  PLT 235  --   --   --   --  218 215  APTT 29  --   --   --   --   --   --   LABPROT 13.9  --   --   --   --   --   --   INR 1.1  --   --   --   --   --   --   HEPARINUNFRC  --    < > 0.29*  --  0.31 0.31 0.15*  CREATININE 1.38*  --   --   --   --  1.24 1.15  TROPONINI <0.03  --  <0.03 <0.03  --   --   --    < > = values in this interval not displayed.   Estimated Creatinine Clearance: 91.6 mL/min (by C-G formula based on SCr of 1.15 mg/dL).  Assessment: 3 yoM presenting with unstable angina. Pharmacy has been consulted for heparin dosing. No AC PTA.  Underwent cardiac cath on 10/15/18 finding 60% LM and widely patent RCA - MD recommending CTVS evaluation.  Heparin drip resumed  4 hours after sheath removal.  6 hour heparin level is 0.15, subtherapeutic this morning on Heparin rate at 1500 units/hr. CBC is within normal limits.. No s/sx of bleeding.   Goal of Therapy:  Heparin level 0.3-0.7 units/ml Monitor platelets by anticoagulation protocol: Yes   Plan:  Increase heparin drip to 1650 units/hr (16.5 ml/hr)  Monitor heparin level 6 hours post rate increase. Monitor daily HL, CBC, s/sx of bleeding, and CTVS plans  Thank you for allowing pharmacy to be a part of this patient's  care.  Nicole Cella, RPh Clinical Pharmacist Please check AMION for all [place] phone numbers After 10:00 PM, call [place] *Pharmacist phone directory can now be found on [email or web address] (PW [email or web address]).  Listed under [place].  10/16/2018 7:21 AM

## 2018-10-16 NOTE — Progress Notes (Addendum)
Progress Note  Patient Name: Larry Pham Date of Encounter: 10/16/2018  Primary Cardiologist: Dr. Carlyle Dolly, MD  Subjective   Pt feeling better today. TCTS at bedside to discuss CABG  Inpatient Medications    Scheduled Meds: . aspirin  81 mg Oral Daily  . atorvastatin  80 mg Oral Daily  . cilostazol  50 mg Oral BID  . ezetimibe  10 mg Oral Daily  . folic acid  1 mg Oral Daily  . lisinopril  40 mg Oral Daily  . LORazepam  0-4 mg Oral Q6H   Followed by  . LORazepam  0-4 mg Oral Q12H  . metoprolol succinate  25 mg Oral Daily  . multivitamin with minerals  1 tablet Oral Daily  . pantoprazole  20 mg Oral Daily  . sodium chloride flush  3 mL Intravenous Once  . sodium chloride flush  3 mL Intravenous Q12H  . sodium chloride flush  3 mL Intravenous Q12H  . thiamine  100 mg Oral Daily   Or  . thiamine  100 mg Intravenous Daily   Continuous Infusions: . sodium chloride    . heparin 1,500 Units/hr (10/15/18 2311)  . sodium chloride Stopped (10/14/18 0235)   PRN Meds: sodium chloride, acetaminophen, loperamide, LORazepam **OR** LORazepam, morphine injection, ondansetron (ZOFRAN) IV, sodium chloride flush   Vital Signs    Vitals:   10/15/18 2038 10/16/18 0454 10/16/18 0456 10/16/18 0458  BP: 132/76 (!) 161/86 (!) 150/89   Pulse: 86 72 71   Resp:      Temp: 98.6 F (37 C) 98.2 F (36.8 C)    TempSrc: Oral Oral    SpO2: 96% 94% 98%   Weight:    105.7 kg  Height:        Intake/Output Summary (Last 24 hours) at 10/16/2018 0719 Last data filed at 10/16/2018 0458 Gross per 24 hour  Intake 2315.07 ml  Output 3525 ml  Net -1209.93 ml   Filed Weights   10/14/18 0309 10/15/18 0421 10/16/18 0458  Weight: 110.8 kg 108.8 kg 105.7 kg    Physical Exam   General: Well developed, well nourished, NAD Skin: Warm, dry, intact  Neck: Negative for carotid bruits. No JVD Lungs:Clear to ausculation bilaterally. No wheezes, rales, or rhonchi. Breathing is unlabored.  Cardiovascular: RRR with S1 S2. No murmurs, rubs, gallops, or LV heave appreciated. Abdomen: Soft, non-tender, non-distended. No obvious abdominal masses. Extremities: No edema. No clubbing or cyanosis. DP/PT pulses 2+ bilaterally Neuro: Alert and oriented. No focal deficits. No facial asymmetry. MAE spontaneously. Psych: Responds to questions appropriately with normal affect.    Labs    Chemistry Recent Labs  Lab 10/13/18 2233 10/15/18 0402 10/16/18 0334  NA 135 139 140  K 3.7 4.1 3.7  CL 103 110 108  CO2 19* 23 23  GLUCOSE 88 99 96  BUN 13 12 12   CREATININE 1.38* 1.24 1.15  CALCIUM 9.0 8.7* 9.2  PROT  --  5.6*  --   ALBUMIN  --  3.3*  --   AST  --  28  --   ALT  --  40  --   ALKPHOS  --  64  --   BILITOT  --  0.4  --   GFRNONAA 58* >60 >60  GFRAA >60 >60 >60  ANIONGAP 13 6 9      Hematology Recent Labs  Lab 10/13/18 2233 10/15/18 0402 10/16/18 0334  WBC 7.4 7.3 7.4  RBC 4.18* 4.04* 4.28  HGB 13.2 12.6* 13.3  HCT 39.3 37.8* 39.6  MCV 94.0 93.6 92.5  MCH 31.6 31.2 31.1  MCHC 33.6 33.3 33.6  RDW 13.6 13.2 13.4  PLT 235 218 215    Cardiac Enzymes Recent Labs  Lab 10/13/18 2233 10/14/18 1129 10/14/18 1718  TROPONINI <0.03 <0.03 <0.03   No results for input(s): TROPIPOC in the last 168 hours.   BNPNo results for input(s): BNP, PROBNP in the last 168 hours.   DDimer No results for input(s): DDIMER in the last 168 hours.   Radiology    Dg Ankle Left Port  Result Date: 10/14/2018 CLINICAL DATA:  Rheumatoid nodule EXAM: PORTABLE LEFT ANKLE - 2 VIEW COMPARISON:  None. FINDINGS: No fracture or dislocation of the left ankle. 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 No large ankle joint effusion. There is no radiographic evidence of erosive or inflammatory arthropathy. Moderate plantar calcaneal spur. IMPRESSION: No fracture or dislocation of the left ankle. 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 No large ankle joint effusion. There is no radiographic  evidence of erosive or inflammatory arthropathy. Moderate plantar calcaneal spur. MRI is the test of choice to evaluate for inflammatory arthritis/synovitis and soft tissue complications of systemic inflammatory disease, per stated indication of rheumatoid nodule. Electronically Signed   By: Eddie Candle M.D.   On: 10/14/2018 16:32   Telemetry    10/16/2018 ST HR 90's - Personally Reviewed  ECG    No new tracing as of 10/16/2018- Personally Reviewed  Cardiac Studies   Cardiac catheterization 10/15/2018:   Prox RCA to Mid RCA lesion is 25% stenosed.  Mid RCA-1 lesion is 50% stenosed.  Mid RCA-2 lesion is 25% stenosed.  Dist RCA lesion is 40% stenosed.  Ost LM to Mid LM lesion is 60% stenosed.   IMPRESSION: Larry Pham has angiographic progression of his distal left main stenosis now in the 60+ percent range.  His RCA stents are widely patent with mild intimal hyperplasia though this does not appear obstructive.  He gives a classic description of new onset exertional chest pain which is fairly reproducible and he is inferolateral T wave inversion.  His LVEDP was elevated.  I believe he will require CABG.  The sheath was removed and a TR band was placed on the right wrist to achieve patent hemostasis.  The patient did receive hydralazine for hypertension.  He left the lab in stable condition.  Dr. Meda Coffee was made aware of these results.  TCTS was notified.   Echocardiogram 10/14/2018: 1. The left ventricle has hyperdynamic systolic function, with an ejection fraction of >65%. The cavity size was normal. Left ventricular diastolic parameters were normal. 2. The right ventricle has normal systolic function. The cavity was normal. There is no increase in right ventricular wall thickness. 3. Left atrial size was moderately dilated. 4. Right atrial size was mildly dilated. 5. Moderate thickening of the mitral valve leaflet. 6. The aortic valve is tricuspid.  Cardiac catheterization  04/22/2015:  Ost 1st Mrg to 1st Mrg lesion, 50% stenosed.  1. Moderate left main stenosis, not flow limiting by FFR (FFR of 0.93) 2. Patent stents mid RCA with minimal restenosis.  3. Moderate distal RCA stenosis.  4. Moderate ostial LAD stenosis.  5. Severe stenosis small caliber distal AV groove Circumflex beyond the takeoff of the large OM branch. (too small for PCI) 6. Normal LV systolic function  Recommendations: He has moderate disease as described above with severe disease in the small caliber distal Circumflex. This vessel is  too small for PCI. Continue medical management.   Patient Profile     53 y.o. male with a hx of CAD (inferior STEMI 2013 s/p PCI to RCA, DES to RCA 2014, last cath 2016 managed medically), LE PAD (s/p prior occurrences of PTA, followed by Dr. Fletcher Anon), HTN, HLD, seasonal allergies, ETOH abuse, tobacco abuse,occult CVA by prior MRI, carpal tunnel, probable CKD III by labswho is being seen today for the evaluation of chest painat the request of Dr. Maudie Mercury.  Assessment & Plan    1.Chest pain with history of CAD, concerning for unstable angina:  -EKG with new TWI  in the inferior leads from prior tracings>> concerning for unstable angina given presentation -Had residual disease in 2016 including moderate LM stenosis -Cardiac catheterization from 10/15/2018 revealed 60% ostial LM to mid LM in which TCTS has been consulted for possible CABG>> awaiting input -Troponin, negative x3, <0.03, <0.03, <0.03 -Continue ASA 81, Toprol 50, high intensity statin   2. Hypertension: -Elevated, 150/89, 161/86, 132/76 -Continue lisinopril 40 -Increase Toprol to 50mg  QD   3. Hyperlipidemia: -Last LDL, 58 on 10/15/2018 with goal of 70mg /dl -Continue high intensity statin  4. ETOH abuse/tobacco abuse: -CIWA precautions>> cessation encouraged  5. CKD stage III: -Creatinine, 1.15 today, down from 1.24 10/15/2018  -Baseline appears to be in the1.2-1.4   Signed, Kathyrn Drown NP-C [place] Pager: [phone] 10/16/2018, 7:19 AM     For questions or updates, please contact   Please consult [email or web address] for contact info under Cardiology/STEMI.  The patient was seen, examined and discussed with Kathyrn Drown, NP  and I agree with the above.   The patient is being evaluated for a CABG tomorrow by Dr Roxan Hockey. He is now chest pain free. Crea is stable, Hb 13.3. elevated BP --> increase toprol XL to 50 mg po daily. No arrhythmias on telemetry.  No signs of fluid overload. We will continue heparin drip till surgery tomorrow.  Ena Dawley, MD 10/16/2018

## 2018-10-16 NOTE — H&P (View-Only) (Signed)
Reason for Consult:Left main disease Referring Physician: Dr. Gwenlyn Found, Dr. Fletcher Anon primary Cards  Larry Pham is an 53 y.o. male.  HPI: Larry Pham is a 53 year old gentleman who presented with a chief complaint of chest pain.  Larry Pham is a 53 year old man with a history of coronary disease, MI, RCA stent, carotid disease, peripheral arterial disease, hypertension, hyperlipidemia, tobacco abuse, remote CVA, and history of ethanol abuse.  He had a drug-eluting stent placed in his right coronary artery in 2013 for an inferior STEMI.  He had a second stent placed in the right coronary in 2014.  His most recent cath was in December 2016.  He had 40% distal left main stenosis at that time.  Over the past couple of weeks he has noted exertional substernal chest pressure.  He walks about a mile from the parking lot to where he works and he would have to stop 2 or 3 times.  He had been using nitroglycerin occasionally.  On 10/14/2018 at approximately 7:53 PM he developed substernal chest pressure at rest he did have some relief with sublingual nitroglycerin but pain did not completely resolve.  He came to the emergency room and was admitted.  He ruled out for MI.  Yesterday he underwent cardiac catheterization where he was found to have a 65% distal left main stenosis.  There was some diffuse disease in the right coronary, his stents were patent but there was approximately 50% stenosis in the mid right coronary.   Past Medical History:  Diagnosis Date  . CAD (coronary artery disease)    a. 2013 Inf STEMI s/p RCA DES. b. DES to RCA 2014 at [facility]. c. 04/2015 Cath: LM 40, LAD 40ost, 60m, D1 30, LCX 59m, OM1 50, RCA 30p, 20/37m, 50d. EF 55-65%.  . Carotid arterial disease ([place])    a. 07/2015 Carotid U/S: <50% bilat.  . Carpal tunnel syndrome   . CKD (chronic kidney disease), stage III ([place])   . CVA (cerebral vascular accident) ([place]) 12/09/2014   MRI brain=> remote infarcts seen in pons and left thalamus    . GERD (gastroesophageal reflux disease)   . Hepatic steatosis   . History of ETOH abuse   . Hyperlipidemia   . Hypertension   . MI (myocardial infarction) ([place]) 02-2012  . PAD (peripheral artery disease) ([place])    a. 06/2015 s/p L SFA stenting; b. 12/2016 PV Angio: R SFA 40-64m, L SFA patent stent-->Med Rx, cillostazol added.  . Seasonal allergies     Past Surgical History:  Procedure Laterality Date  . ABDOMINAL AORTOGRAM W/LOWER EXTREMITY N/A 01/04/2017   Procedure: ABDOMINAL AORTOGRAM W/LOWER EXTREMITY;  Surgeon: Wellington Hampshire, MD;  Location: [facility];  Service: Cardiovascular;  Laterality: N/A;  . CARDIAC CATHETERIZATION N/A 04/22/2015   Procedure: Left Heart Cath and Coronary Angiography;  Surgeon: Burnell Blanks, MD;  Location: [facility];  Service: Cardiovascular;  Laterality: N/A;  . CARDIAC CATHETERIZATION N/A 04/22/2015   Procedure: Intravascular Pressure Wire/FFR Study;  Surgeon: Burnell Blanks, MD;  Location: [facility];  Service: Cardiovascular;  Laterality: N/A;  . COLONOSCOPY WITH PROPOFOL N/A 03/23/2015   Procedure: COLONOSCOPY WITH PROPOFOL;  Surgeon: Daneil Dolin, MD;  Location: [facility];  Service: Endoscopy;  Laterality: N/A;  [place] time in 0849  time out  0959  total time 10 mintues  . CORONARY ANGIOPLASTY    . CORONARY ARTERY CATHETERIZATION WITH STENTING N/A 02-2102 AND 05-2012  . LEFT FOREARM SURGERY  s/p machine accident  . LEFT HEART CATH AND CORONARY ANGIOGRAPHY N/A 10/15/2018   Procedure: LEFT HEART CATH AND CORONARY ANGIOGRAPHY;  Surgeon: Lorretta Harp, MD;  Location: [facility];  Service: Cardiovascular;  Laterality: N/A;  . LOWER EXTREMITY ANGIOGRAM Bilateral 07/01/2015   Procedure: Lower Extremity Angiogram;  Surgeon: Wellington Hampshire, MD;  Location: [facility];  Service: Cardiovascular;  Laterality: Bilateral;  . PERIPHERAL VASCULAR CATHETERIZATION N/A 07/01/2015   Procedure: Abdominal  Aortogram;  Surgeon: Wellington Hampshire, MD;  Location: [facility];  Service: Cardiovascular;  Laterality: N/A;  . PERIPHERAL VASCULAR CATHETERIZATION Left 07/01/2015   Procedure: Peripheral Vascular Intervention;  Surgeon: Wellington Hampshire, MD;  Location: [facility];  Service: Cardiovascular;  Laterality: Left;  SFA  . POLYPECTOMY N/A 03/23/2015   Procedure: POLYPECTOMY;  Surgeon: Daneil Dolin, MD;  Location: [facility];  Service: Endoscopy;  Laterality: N/A;  cecal, descending colon  . TRIGGER FINGER RELEASE     right 5th digit    Family History  Problem Relation Age of Onset  . Hypertension Mother   . Heart disease Mother   . Cirrhosis Father        DIED AT AGE 54  . Breast cancer Paternal Grandmother        DECEASED  . Colon cancer Neg Hx     Social History:  reports that he has been smoking cigarettes. He started smoking about 40 years ago. He has a 30.00 pack-year smoking history. He has never used smokeless tobacco. He reports current alcohol use. He reports that he does not use drugs.  Allergies:  Allergies  Allergen Reactions  . Imdur [Isosorbide Nitrate] Other (See Comments)    Made CP worse  . Bee Venom Swelling    Medications:  Scheduled: . aspirin  81 mg Oral Daily  . atorvastatin  80 mg Oral Daily  . cilostazol  50 mg Oral BID  . ezetimibe  10 mg Oral Daily  . folic acid  1 mg Oral Daily  . lisinopril  40 mg Oral Daily  . LORazepam  0-4 mg Oral Q12H  . metoprolol succinate  50 mg Oral Daily  . multivitamin with minerals  1 tablet Oral Daily  . pantoprazole  20 mg Oral Daily  . sodium chloride flush  3 mL Intravenous Once  . sodium chloride flush  3 mL Intravenous Q12H  . sodium chloride flush  3 mL Intravenous Q12H  . thiamine  100 mg Oral Daily   Or  . thiamine  100 mg Intravenous Daily    Results for orders placed or performed during the hospital encounter of 10/13/18 (from the past 48 hour(s))  Heparin level (unfractionated)     Status:  Abnormal   Collection Time: 10/14/18 11:29 AM  Result Value Ref Range   Heparin Unfractionated 0.29 (L) 0.30 - 0.70 IU/mL    Comment: (NOTE) If heparin results are below expected values, and patient dosage has  been confirmed, suggest follow up testing of antithrombin III levels. Performed at [facility], [place] [address] [place], [place] [address]   Troponin I - Now Then Q6H     Status: None   Collection Time: 10/14/18 11:29 AM  Result Value Ref Range   Troponin I <0.03 <0.03 ng/mL    Comment: Performed at [facility] [address], [place], [place] [address]  Troponin I - Now Then Q6H     Status: None  Collection Time: 10/14/18  5:18 PM  Result Value Ref Range   Troponin I <0.03 <0.03 ng/mL    Comment: Performed at [facility], [place] [address], [place], [place] [address]  Heparin level (unfractionated)     Status: None   Collection Time: 10/14/18  6:46 PM  Result Value Ref Range   Heparin Unfractionated 0.31 0.30 - 0.70 IU/mL    Comment: (NOTE) If heparin results are below expected values, and patient dosage has  been confirmed, suggest follow up testing of antithrombin III levels. Performed at [facility], [place] [address] [place], [place] [address]   Heparin level (unfractionated)     Status: None   Collection Time: 10/15/18  4:02 AM  Result Value Ref Range   Heparin Unfractionated 0.31 0.30 - 0.70 IU/mL    Comment: (NOTE) If heparin results are below expected values, and patient dosage has  been confirmed, suggest follow up testing of antithrombin III levels. Performed at [facility], [place] [address] [place], [place] [address]   CBC     Status: Abnormal   Collection Time: 10/15/18  4:02 AM  Result Value Ref Range   WBC 7.3 4.0 - 10.5 K/uL   RBC 4.04 (L) 4.22 - 5.81 MIL/uL   Hemoglobin 12.6 (L) 13.0 - 17.0 g/dL   HCT 37.8 (L) 39.0 - 52.0 %   MCV 93.6 80.0 - 100.0 fL   MCH 31.2 26.0 - 34.0 pg   MCHC 33.3 30.0 - 36.0 g/dL   RDW  13.2 11.5 - 15.5 %   Platelets 218 150 - 400 K/uL   nRBC 0.0 0.0 - 0.2 %    Comment: Performed at [facility], [place] [address], [place], [place] [address]  Lipid panel     Status: None   Collection Time: 10/15/18  4:02 AM  Result Value Ref Range   Cholesterol 118 0 - 200 mg/dL   Triglycerides 60 <150 mg/dL   HDL 48 >40 mg/dL   Total CHOL/HDL Ratio 2.5 RATIO   VLDL 12 0 - 40 mg/dL   LDL Cholesterol 58 0 - 99 mg/dL    Comment:        Total Cholesterol/HDL:CHD Risk Coronary Heart Disease Risk Table                     Men   Women  1/2 Average Risk   3.4   3.3  Average Risk       5.0   4.4  2 X Average Risk   9.6   7.1  3 X Average Risk  23.4   11.0        Use the calculated Patient Ratio above and the CHD Risk Table to determine the patient's CHD Risk.        ATP III CLASSIFICATION (LDL):  <100     mg/dL   Optimal  100-129  mg/dL   Near or Above                    Optimal  130-159  mg/dL   Borderline  160-189  mg/dL   High  >190     mg/dL   Very High Performed at [facility] [address] [place], [place] [address]   Basic metabolic panel     Status: Abnormal   Collection Time: 10/15/18  4:02 AM  Result Value Ref Range   Sodium 139 135 - 145 mmol/L  Potassium 4.1 3.5 - 5.1 mmol/L   Chloride 110 98 - 111 mmol/L   CO2 23 22 - 32 mmol/L   Glucose, Bld 99 70 - 99 mg/dL   BUN 12 6 - 20 mg/dL   Creatinine, Ser 1.24 0.61 - 1.24 mg/dL   Calcium 8.7 (L) 8.9 - 10.3 mg/dL   GFR calc non Af Amer >60 >60 mL/min   GFR calc Af Amer >60 >60 mL/min   Anion gap 6 5 - 15    Comment: Performed at [facility] [address], [place], [place] [address]  Hepatic function panel     Status: Abnormal   Collection Time: 10/15/18  4:02 AM  Result Value Ref Range   Total Protein 5.6 (L) 6.5 - 8.1 g/dL   Albumin 3.3 (L) 3.5 - 5.0 g/dL   AST 28 15 - 41 U/L   ALT 40 0 - 44 U/L   Alkaline Phosphatase 64 38 - 126 U/L   Total Bilirubin 0.4 0.3 - 1.2 mg/dL   Bilirubin,  Direct <0.1 0.0 - 0.2 mg/dL   Indirect Bilirubin NOT CALCULATED 0.3 - 0.9 mg/dL    Comment: Performed at [facility] [address], [place], [place] [address]  CBC     Status: None   Collection Time: 10/16/18  3:34 AM  Result Value Ref Range   WBC 7.4 4.0 - 10.5 K/uL   RBC 4.28 4.22 - 5.81 MIL/uL   Hemoglobin 13.3 13.0 - 17.0 g/dL   HCT 39.6 39.0 - 52.0 %   MCV 92.5 80.0 - 100.0 fL   MCH 31.1 26.0 - 34.0 pg   MCHC 33.6 30.0 - 36.0 g/dL   RDW 13.4 11.5 - 15.5 %   Platelets 215 150 - 400 K/uL   nRBC 0.0 0.0 - 0.2 %    Comment: Performed at [facility], [place] [address], [place], [place] [address]  Basic metabolic panel     Status: None   Collection Time: 10/16/18  3:34 AM  Result Value Ref Range   Sodium 140 135 - 145 mmol/L   Potassium 3.7 3.5 - 5.1 mmol/L   Chloride 108 98 - 111 mmol/L   CO2 23 22 - 32 mmol/L   Glucose, Bld 96 70 - 99 mg/dL   BUN 12 6 - 20 mg/dL   Creatinine, Ser 1.15 0.61 - 1.24 mg/dL   Calcium 9.2 8.9 - 10.3 mg/dL   GFR calc non Af Amer >60 >60 mL/min   GFR calc Af Amer >60 >60 mL/min   Anion gap 9 5 - 15    Comment: Performed at [facility], [place] [address], [place], [place] [address]  Heparin level (unfractionated)     Status: Abnormal   Collection Time: 10/16/18  3:34 AM  Result Value Ref Range   Heparin Unfractionated 0.15 (L) 0.30 - 0.70 IU/mL    Comment: (NOTE) If heparin results are below expected values, and patient dosage has  been confirmed, suggest follow up testing of antithrombin III levels. Performed at [facility], [place] [address], [place], [place] [address]     Dg Ankle Left Port  Result Date: 10/14/2018 CLINICAL DATA:  Rheumatoid nodule EXAM: PORTABLE LEFT ANKLE - 2 VIEW COMPARISON:  None. FINDINGS: No fracture or dislocation of the left ankle. 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 No large ankle joint effusion. There is no radiographic evidence of erosive or inflammatory arthropathy. Moderate plantar  calcaneal spur.  IMPRESSION: No fracture or dislocation of the left ankle. 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 No large ankle joint effusion. There is no radiographic evidence of erosive or inflammatory arthropathy. Moderate plantar calcaneal spur. MRI is the test of choice to evaluate for inflammatory arthritis/synovitis and soft tissue complications of systemic inflammatory disease, per stated indication of rheumatoid nodule. Electronically Signed   By: Eddie Candle M.D.   On: 10/14/2018 16:32    Review of Systems  Constitutional: Positive for malaise/fatigue. Negative for chills and fever.  Eyes: Negative for blurred vision and double vision.  Respiratory: Positive for shortness of breath. Negative for cough and sputum production.   Cardiovascular: Positive for chest pain and claudication (right > left).  Gastrointestinal: Negative for nausea and vomiting.  Genitourinary: Negative for dysuria and urgency.  Neurological: Negative for loss of consciousness and weakness.  All other systems reviewed and are negative.  Blood pressure (!) 150/89, pulse 71, temperature 98.2 F (36.8 C), temperature source Oral, resp. rate (!) 0, height 5' 11.5" (1.816 m), weight 105.7 kg, SpO2 98 %. Physical Exam  Vitals reviewed. Constitutional: He is oriented to person, place, and time. He appears well-developed and well-nourished.  HENT:  Head: Normocephalic and atraumatic.  Mouth/Throat: No oropharyngeal exudate.  Eyes: Conjunctivae and EOM are normal. No scleral icterus.  Neck: No thyromegaly present.  No carotrid bruit  Cardiovascular: Normal rate, regular rhythm, S1 normal and S2 normal.  No murmur heard. Pulses:      Carotid pulses are 2+ on the right side and 2+ on the left side.      Dorsalis pedis pulses are 2+ on the right side and 2+ on the left side.       Posterior tibial pulses are 0 on the right side and 2+ on the left side.  Respiratory: Effort normal and breath sounds normal. He has  no wheezes. He has no rales.  GI: Soft. Bowel sounds are normal. He exhibits no distension. There is no abdominal tenderness.  Musculoskeletal:        General: Deformity (firm nodule left medial ankle) present. No edema.  Lymphadenopathy:    He has no cervical adenopathy.  Neurological: He is alert and oriented to person, place, and time. No cranial nerve deficit. He exhibits normal muscle tone. Coordination normal.  Skin: Skin is warm and dry.   CARDIAC CATHETERIZATION Coronary Findings   Diagnostic  Dominance: Right  Left Main  Ost LM to Mid LM lesion 60% stenosed  Ost LM to Mid LM lesion is 60% stenosed.  Right Coronary Artery  Prox RCA to Mid RCA lesion 25% stenosed  Prox RCA to Mid RCA lesion is 25% stenosed. The lesion was previously treated.  Mid RCA-1 lesion 50% stenosed  Mid RCA-1 lesion is 50% stenosed.  Mid RCA-2 lesion 25% stenosed  Mid RCA-2 lesion is 25% stenosed. The lesion was previously treated.  Dist RCA lesion 40% stenosed  Dist RCA lesion is 40% stenosed.   I personally reviewed the cath images and concur with the findings noted above.  Assessment/Plan: Larry Pham is a 53 year old man with history of hypertension, hyperlipidemia, tobacco abuse, coronary artery disease, MI, previous stenting of his right coronary, remote CVA, nonocclusive carotid stenosis, peripheral arterial disease with previous stenting, and a history of ethanol abuse.  He presented with an unstable coronary syndrome and was found to have a hemodynamically significant distal left main lesion is not amenable to percutaneous intervention.  He is referred for coronary artery bypass grafting.  Coronary artery bypass grafting is indicated for survival benefit and relief of symptoms.    I discussed the general nature of the procedure, including the need for general anesthesia, the use of cardiopulmonary bypass and the incisions to be used with Larry Pham. We discussed the expected hospital stay,  overall recovery and short and long term outcomes.  I informed him of the indications, risks, benefits, and alternatives.  They understand the risks include, but are not limited to death, stroke, MI, DVT/PE, bleeding, possible need for transfusion, infections, cardiac arrhythmias, as well as other organ system dysfunction including respiratory, renal, or GI complications.   He accepts the risks and agrees to proceed.  Melrose Nakayama 10/16/2018, 9:18 AM

## 2018-10-16 NOTE — Progress Notes (Signed)
ANTICOAGULATION CONSULT NOTE - Follow Up Consult  Pharmacy Consult for Heparin Indication: chest pain/ACS  Allergies  Allergen Reactions  . Imdur [Isosorbide Nitrate] Other (See Comments)    Made CP worse  . Bee Venom Swelling    SWELLING REACTION UNSPECIFIED    Patient Measurements: Height: 5' 11.5" (181.6 cm) Weight: 233 lb (105.7 kg) IBW/kg (Calculated) : 76.45 Heparin Dosing Weight: 98.6 kg  Vital Signs: Temp: 98.2 F (36.8 C) (06/09 0454) Temp Source: Oral (06/09 0454) BP: 150/89 (06/09 0456) Pulse Rate: 71 (06/09 0456)  Labs: Recent Labs    10/13/18 2233 10/14/18 1129 10/14/18 1718  10/15/18 0402 10/16/18 0334 10/16/18 1424  HGB 13.2  --   --   --  12.6* 13.3  --   HCT 39.3  --   --   --  37.8* 39.6  --   PLT 235  --   --   --  218 215  --   APTT 29  --   --   --   --   --   --   LABPROT 13.9  --   --   --   --   --   --   INR 1.1  --   --   --   --   --   --   HEPARINUNFRC  --  0.29*  --    < > 0.31 0.15* 0.27*  CREATININE 1.38*  --   --   --  1.24 1.15  --   TROPONINI <0.03 <0.03 <0.03  --   --   --   --    < > = values in this interval not displayed.   Estimated Creatinine Clearance: 91.6 mL/min (by C-G formula based on SCr of 1.15 mg/dL).  Assessment: 53 yo M presenting with unstable angina. Pharmacy has been consulted for heparin dosing. No AC PTA.  Underwent cardiac cath on 10/15/18 finding 60% LM and widely patent RCA - pt is planned for CABG on 6/10.    Heparin level is 0.27, near goal, on 1650 units/hr.  Will defer any further levels in anticipation of heparin being off for surgery in AM.  Goal of Therapy:  Heparin level 0.3-0.7 units/ml Monitor platelets by anticoagulation protocol: Yes   Plan:  Increase heparin drip to 1800 units/hr (18 ml/hr)  Hold heparin on call to [place] surgery in AM.  Thank you for allowing pharmacy to be a part of this patient's care.  [employer], Pharm.D., BCPS Clinical Pharmacist  **Pharmacist  phone directory can now be found on [email or web address] ([email or web address]).  Listed under [place].  10/16/2018 3:06 PM

## 2018-10-17 ENCOUNTER — Inpatient Hospital Stay (HOSPITAL_COMMUNITY): Payer: BC Managed Care – PPO

## 2018-10-17 ENCOUNTER — Inpatient Hospital Stay (HOSPITAL_COMMUNITY)
Admission: EM | Disposition: A | Discharge status: Home / Self Care | Payer: Self-pay | Source: Home / Self Care | Attending: Thoracic Surgery (Cardiothoracic Vascular Surgery)

## 2018-10-17 ENCOUNTER — Inpatient Hospital Stay (HOSPITAL_COMMUNITY): Payer: BC Managed Care – PPO | Admitting: Certified Registered Nurse Anesthetist

## 2018-10-17 DIAGNOSIS — Z951 Presence of aortocoronary bypass graft: Secondary | ICD-10-CM

## 2018-10-17 DIAGNOSIS — I25110 Atherosclerotic heart disease of native coronary artery with unstable angina pectoris: Secondary | ICD-10-CM

## 2018-10-17 HISTORY — PX: TEE WITHOUT CARDIOVERSION: SHX5443

## 2018-10-17 HISTORY — PX: CORONARY ARTERY BYPASS GRAFT: SHX141

## 2018-10-17 LAB — POCT I-STAT 4, (NA,K, GLUC, HGB,HCT)
Glucose, Bld: 93 mg/dL (ref 70–99)
Glucose, Bld: 99 mg/dL (ref 70–99)
Glucose, Bld: 90 mg/dL (ref 70–99)
Glucose, Bld: 131 mg/dL — ABNORMAL HIGH (ref 70–99)
Glucose, Bld: 112 mg/dL — ABNORMAL HIGH (ref 70–99)
HCT: 39 % (ref 39.0–52.0)
HCT: 36 % — ABNORMAL LOW (ref 39.0–52.0)
HCT: 33 % — ABNORMAL LOW (ref 39.0–52.0)
HCT: 35 % — ABNORMAL LOW (ref 39.0–52.0)
HCT: 33 % — ABNORMAL LOW (ref 39.0–52.0)
Hemoglobin: 13.3 g/dL (ref 13.0–17.0)
Hemoglobin: 12.2 g/dL — ABNORMAL LOW (ref 13.0–17.0)
Hemoglobin: 11.2 g/dL — ABNORMAL LOW (ref 13.0–17.0)
Hemoglobin: 11.9 g/dL — ABNORMAL LOW (ref 13.0–17.0)
Hemoglobin: 11.2 g/dL — ABNORMAL LOW (ref 13.0–17.0)
Potassium: 4.2 mmol/L (ref 3.5–5.1)
Potassium: 4.1 mmol/L (ref 3.5–5.1)
Potassium: 4.5 mmol/L (ref 3.5–5.1)
Potassium: 5.5 mmol/L — ABNORMAL HIGH (ref 3.5–5.1)
Potassium: 4.9 mmol/L (ref 3.5–5.1)
Sodium: 141 mmol/L (ref 135–145)
Sodium: 138 mmol/L (ref 135–145)
Sodium: 139 mmol/L (ref 135–145)
Sodium: 136 mmol/L (ref 135–145)
Sodium: 138 mmol/L (ref 135–145)

## 2018-10-17 LAB — CREATININE, SERUM
Creatinine, Ser: 0.9 mg/dL (ref 0.61–1.24)
GFR calc Af Amer: >60 mL/min (ref >60)
GFR calc non Af Amer: >60 mL/min (ref >60)

## 2018-10-17 LAB — CBC
HCT: 41.1 % (ref 39.0–52.0)
HCT: 34.3 % — ABNORMAL LOW (ref 39.0–52.0)
HCT: 34.3 % — ABNORMAL LOW (ref 39.0–52.0)
Hemoglobin: 13.8 g/dL (ref 13.0–17.0)
Hemoglobin: 11.1 g/dL — ABNORMAL LOW (ref 13.0–17.0)
Hemoglobin: 11.3 g/dL — ABNORMAL LOW (ref 13.0–17.0)
MCH: 31.3 pg (ref 26.0–34.0)
MCH: 30.9 pg (ref 26.0–34.0)
MCH: 31.6 pg (ref 26.0–34.0)
MCHC: 33.6 g/dL (ref 30.0–36.0)
MCHC: 32.4 g/dL (ref 30.0–36.0)
MCHC: 32.9 g/dL (ref 30.0–36.0)
MCV: 93.2 fL (ref 80.0–100.0)
MCV: 95.5 fL (ref 80.0–100.0)
MCV: 95.8 fL (ref 80.0–100.0)
Platelets: 221 10*3/uL (ref 150–400)
Platelets: 140 10*3/uL — ABNORMAL LOW (ref 150–400)
Platelets: 162 10*3/uL (ref 150–400)
RBC: 4.41 MIL/uL (ref 4.22–5.81)
RBC: 3.59 MIL/uL — ABNORMAL LOW (ref 4.22–5.81)
RBC: 3.58 MIL/uL — ABNORMAL LOW (ref 4.22–5.81)
RDW: 13.2 % (ref 11.5–15.5)
RDW: 13.4 % (ref 11.5–15.5)
RDW: 13.4 % (ref 11.5–15.5)
WBC: 8.6 10*3/uL (ref 4.0–10.5)
WBC: 12 10*3/uL — ABNORMAL HIGH (ref 4.0–10.5)
WBC: 14.3 10*3/uL — ABNORMAL HIGH (ref 4.0–10.5)
nRBC: 0 % (ref 0.0–0.2)
nRBC: 0 % (ref 0.0–0.2)
nRBC: 0 % (ref 0.0–0.2)

## 2018-10-17 LAB — BASIC METABOLIC PANEL
Anion gap: 8 (ref 5–15)
BUN: 14 mg/dL (ref 6–20)
CO2: 24 mmol/L (ref 22–32)
Calcium: 9.4 mg/dL (ref 8.9–10.3)
Chloride: 108 mmol/L (ref 98–111)
Creatinine, Ser: 1.16 mg/dL (ref 0.61–1.24)
GFR calc Af Amer: >60 mL/min (ref >60)
GFR calc non Af Amer: >60 mL/min (ref >60)
Glucose, Bld: 100 mg/dL — ABNORMAL HIGH (ref 70–99)
Potassium: 4.1 mmol/L (ref 3.5–5.1)
Sodium: 140 mmol/L (ref 135–145)

## 2018-10-17 LAB — POCT I-STAT 7, (LYTES, BLD GAS, ICA,H+H)
Acid-base deficit: 1 mmol/L (ref 0.0–2.0)
Acid-base deficit: 4 mmol/L — ABNORMAL HIGH (ref 0.0–2.0)
Acid-base deficit: 5 mmol/L — ABNORMAL HIGH (ref 0.0–2.0)
Acid-base deficit: 2 mmol/L (ref 0.0–2.0)
Bicarbonate: 25.4 mmol/L (ref 20.0–28.0)
Bicarbonate: 26.6 mmol/L (ref 20.0–28.0)
Bicarbonate: 22 mmol/L (ref 20.0–28.0)
Bicarbonate: 21.1 mmol/L (ref 20.0–28.0)
Bicarbonate: 23.6 mmol/L (ref 20.0–28.0)
Calcium, Ion: 1.05 mmol/L — ABNORMAL LOW (ref 1.15–1.40)
Calcium, Ion: 1.18 mmol/L (ref 1.15–1.40)
Calcium, Ion: 1.17 mmol/L (ref 1.15–1.40)
Calcium, Ion: 1.26 mmol/L (ref 1.15–1.40)
Calcium, Ion: 1.22 mmol/L (ref 1.15–1.40)
HCT: 32 % — ABNORMAL LOW (ref 39.0–52.0)
HCT: 34 % — ABNORMAL LOW (ref 39.0–52.0)
HCT: 32 % — ABNORMAL LOW (ref 39.0–52.0)
HCT: 36 % — ABNORMAL LOW (ref 39.0–52.0)
HCT: 35 % — ABNORMAL LOW (ref 39.0–52.0)
Hemoglobin: 10.9 g/dL — ABNORMAL LOW (ref 13.0–17.0)
Hemoglobin: 11.6 g/dL — ABNORMAL LOW (ref 13.0–17.0)
Hemoglobin: 10.9 g/dL — ABNORMAL LOW (ref 13.0–17.0)
Hemoglobin: 12.2 g/dL — ABNORMAL LOW (ref 13.0–17.0)
Hemoglobin: 11.9 g/dL — ABNORMAL LOW (ref 13.0–17.0)
O2 Saturation: 100 %
O2 Saturation: 100 %
O2 Saturation: 100 %
O2 Saturation: 98 %
O2 Saturation: 99 %
Patient temperature: 35.5
Patient temperature: 36.2
Patient temperature: 36
Potassium: 4.7 mmol/L (ref 3.5–5.1)
Potassium: 5 mmol/L (ref 3.5–5.1)
Potassium: 4.1 mmol/L (ref 3.5–5.1)
Potassium: 4.7 mmol/L (ref 3.5–5.1)
Potassium: 4.7 mmol/L (ref 3.5–5.1)
Sodium: 139 mmol/L (ref 135–145)
Sodium: 139 mmol/L (ref 135–145)
Sodium: 140 mmol/L (ref 135–145)
Sodium: 137 mmol/L (ref 135–145)
Sodium: 139 mmol/L (ref 135–145)
TCO2: 27 mmol/L (ref 22–32)
TCO2: 28 mmol/L (ref 22–32)
TCO2: 23 mmol/L (ref 22–32)
TCO2: 22 mmol/L (ref 22–32)
TCO2: 25 mmol/L (ref 22–32)
pCO2 arterial: 42.1 mmHg (ref 32.0–48.0)
pCO2 arterial: 55.4 mmHg — ABNORMAL HIGH (ref 32.0–48.0)
pCO2 arterial: 38.8 mmHg (ref 32.0–48.0)
pCO2 arterial: 41 mmHg (ref 32.0–48.0)
pCO2 arterial: 41.4 mmHg (ref 32.0–48.0)
pH, Arterial: 7.389 (ref 7.350–7.450)
pH, Arterial: 7.289 — ABNORMAL LOW (ref 7.350–7.450)
pH, Arterial: 7.354 (ref 7.350–7.450)
pH, Arterial: 7.316 — ABNORMAL LOW (ref 7.350–7.450)
pH, Arterial: 7.359 (ref 7.350–7.450)
pO2, Arterial: 439 mmHg — ABNORMAL HIGH (ref 83.0–108.0)
pO2, Arterial: 486 mmHg — ABNORMAL HIGH (ref 83.0–108.0)
pO2, Arterial: 171 mmHg — ABNORMAL HIGH (ref 83.0–108.0)
pO2, Arterial: 107 mmHg (ref 83.0–108.0)
pO2, Arterial: 121 mmHg — ABNORMAL HIGH (ref 83.0–108.0)

## 2018-10-17 LAB — POCT I-STAT, CHEM 8
BUN: 13 mg/dL (ref 6–20)
Calcium, Ion: 1.2 mmol/L (ref 1.15–1.40)
Chloride: 106 mmol/L (ref 98–111)
Creatinine, Ser: 0.8 mg/dL (ref 0.61–1.24)
Glucose, Bld: 117 mg/dL — ABNORMAL HIGH (ref 70–99)
HCT: 35 % — ABNORMAL LOW (ref 39.0–52.0)
Hemoglobin: 11.9 g/dL — ABNORMAL LOW (ref 13.0–17.0)
Potassium: 4.6 mmol/L (ref 3.5–5.1)
Sodium: 137 mmol/L (ref 135–145)
TCO2: 23 mmol/L (ref 22–32)

## 2018-10-17 LAB — PROTIME-INR
INR: 1.4 — ABNORMAL HIGH (ref 0.8–1.2)
Prothrombin Time: 16.6 seconds — ABNORMAL HIGH (ref 11.4–15.2)

## 2018-10-17 LAB — APTT: aPTT: 30 seconds (ref 24–36)

## 2018-10-17 LAB — ECHO INTRAOPERATIVE TEE
Height: 71.5 in
Weight: 3752 oz

## 2018-10-17 LAB — GLUCOSE, CAPILLARY
Glucose-Capillary: 112 mg/dL — ABNORMAL HIGH (ref 70–99)
Glucose-Capillary: 113 mg/dL — ABNORMAL HIGH (ref 70–99)
Glucose-Capillary: 123 mg/dL — ABNORMAL HIGH (ref 70–99)
Glucose-Capillary: 132 mg/dL — ABNORMAL HIGH (ref 70–99)
Glucose-Capillary: 122 mg/dL — ABNORMAL HIGH (ref 70–99)
Glucose-Capillary: 118 mg/dL — ABNORMAL HIGH (ref 70–99)
Glucose-Capillary: 115 mg/dL — ABNORMAL HIGH (ref 70–99)

## 2018-10-17 LAB — HEPARIN LEVEL (UNFRACTIONATED): Heparin Unfractionated: 0.54 IU/mL (ref 0.30–0.70)

## 2018-10-17 LAB — MAGNESIUM: Magnesium: 3 mg/dL — ABNORMAL HIGH (ref 1.7–2.4)

## 2018-10-17 LAB — HEMOGLOBIN AND HEMATOCRIT, BLOOD
HCT: 35.5 % — ABNORMAL LOW (ref 39.0–52.0)
Hemoglobin: 11.9 g/dL — ABNORMAL LOW (ref 13.0–17.0)

## 2018-10-17 LAB — PLATELET COUNT: Platelets: 174 10*3/uL (ref 150–400)

## 2018-10-17 SURGERY — CORONARY ARTERY BYPASS GRAFTING (CABG)
Anesthesia: General | Site: Chest

## 2018-10-17 MED ORDER — FENTANYL CITRATE (PF) 250 MCG/5ML IJ SOLN
INTRAMUSCULAR | Status: DC | PRN
  Administered 2018-10-17: 250 ug via INTRAVENOUS
  Administered 2018-10-17: 50 ug via INTRAVENOUS
  Administered 2018-10-17: 250 ug via INTRAVENOUS
  Administered 2018-10-17: 150 ug via INTRAVENOUS
  Administered 2018-10-17: 250 ug via INTRAVENOUS
  Administered 2018-10-17: 50 ug via INTRAVENOUS
  Administered 2018-10-17: 100 ug via INTRAVENOUS
  Administered 2018-10-17: 400 ug via INTRAVENOUS

## 2018-10-17 MED ORDER — MIDAZOLAM HCL 2 MG/2ML IJ SOLN: 2.0000 mg | INTRAMUSCULAR | Status: DC | PRN

## 2018-10-17 MED ORDER — OXYCODONE HCL 5 MG PO TABS
5.0000 mg | ORAL_TABLET | ORAL | Status: DC | PRN
  Administered 2018-10-17: 5 mg via ORAL
  Filled 2018-10-17: qty 1

## 2018-10-17 MED ORDER — HYDRALAZINE HCL 20 MG/ML IJ SOLN
10.0000 mg | Freq: Once | INTRAMUSCULAR | Status: AC
  Administered 2018-10-17: 10 mg via INTRAVENOUS
  Filled 2018-10-17: qty 1

## 2018-10-17 MED ORDER — PHENYLEPHRINE HCL-NACL 20-0.9 MG/250ML-% IV SOLN: 0.0000 ug/min | INTRAVENOUS | Status: DC

## 2018-10-17 MED ORDER — METOPROLOL TARTRATE 5 MG/5ML IV SOLN: 2.5000 mg | INTRAVENOUS | Status: DC | PRN

## 2018-10-17 MED ORDER — ACETAMINOPHEN 160 MG/5ML PO SOLN: 650.0000 mg | Freq: Once | ORAL | Status: AC

## 2018-10-17 MED ORDER — MIDAZOLAM HCL 5 MG/5ML IJ SOLN
INTRAMUSCULAR | Status: DC | PRN
  Administered 2018-10-17 (×2): 1 mg via INTRAVENOUS
  Administered 2018-10-17: 2 mg via INTRAVENOUS
  Administered 2018-10-17 (×2): 3 mg via INTRAVENOUS

## 2018-10-17 MED ORDER — POTASSIUM CHLORIDE 10 MEQ/50ML IV SOLN: 10.0000 meq | INTRAVENOUS | Status: AC

## 2018-10-17 MED ORDER — METOPROLOL TARTRATE 25 MG/10 ML ORAL SUSPENSION: 12.5000 mg | Freq: Two times a day (BID) | ORAL | Status: DC

## 2018-10-17 MED ORDER — ACETAMINOPHEN 160 MG/5ML PO SOLN: 1000.0000 mg | Freq: Four times a day (QID) | ORAL | Status: DC

## 2018-10-17 MED ORDER — LACTATED RINGERS IV SOLN: INTRAVENOUS | Status: DC

## 2018-10-17 MED ORDER — SODIUM CHLORIDE (PF) 0.9 % IJ SOLN
OROMUCOSAL | Status: DC | PRN
  Administered 2018-10-17 (×3): 4 mL via TOPICAL

## 2018-10-17 MED ORDER — PHENYLEPHRINE 40 MCG/ML (10ML) SYRINGE FOR IV PUSH (FOR BLOOD PRESSURE SUPPORT)
PREFILLED_SYRINGE | INTRAVENOUS | Status: AC
  Filled 2018-10-17: qty 10

## 2018-10-17 MED ORDER — LACTATED RINGERS IV SOLN: 500.0000 mL | Freq: Once | INTRAVENOUS | Status: DC | PRN

## 2018-10-17 MED ORDER — ROCURONIUM BROMIDE 100 MG/10ML IV SOLN
INTRAVENOUS | Status: DC | PRN
  Administered 2018-10-17 (×2): 100 mg via INTRAVENOUS
  Administered 2018-10-17: 50 mg via INTRAVENOUS

## 2018-10-17 MED ORDER — PROTAMINE SULFATE 10 MG/ML IV SOLN
INTRAVENOUS | Status: AC
  Filled 2018-10-17: qty 25

## 2018-10-17 MED ORDER — NITROGLYCERIN IN D5W 200-5 MCG/ML-% IV SOLN: 0.0000 ug/min | INTRAVENOUS | Status: DC

## 2018-10-17 MED ORDER — INSULIN ASPART 100 UNIT/ML ~~LOC~~ SOLN
0.0000 [IU] | SUBCUTANEOUS | Status: DC
  Administered 2018-10-18 (×2): 2 [IU] via SUBCUTANEOUS

## 2018-10-17 MED ORDER — MORPHINE SULFATE (PF) 2 MG/ML IV SOLN
1.0000 mg | INTRAVENOUS | Status: DC | PRN
  Administered 2018-10-17 – 2018-10-18 (×4): 2 mg via INTRAVENOUS
  Filled 2018-10-17 (×4): qty 1

## 2018-10-17 MED ORDER — SODIUM CHLORIDE 0.45 % IV SOLN
INTRAVENOUS | Status: DC | PRN
  Administered 2018-10-17: 14:00:00 via INTRAVENOUS

## 2018-10-17 MED ORDER — LACTATED RINGERS IV SOLN
INTRAVENOUS | Status: DC | PRN
  Administered 2018-10-17: 07:00:00 via INTRAVENOUS

## 2018-10-17 MED ORDER — 0.9 % SODIUM CHLORIDE (POUR BTL) OPTIME
TOPICAL | Status: DC | PRN
  Administered 2018-10-17: 08:00:00 6000 mL

## 2018-10-17 MED ORDER — DEXMEDETOMIDINE HCL IN NACL 400 MCG/100ML IV SOLN
0.0000 ug/kg/h | INTRAVENOUS | Status: DC
  Administered 2018-10-17: 0.5 ug/kg/h via INTRAVENOUS

## 2018-10-17 MED ORDER — INSULIN REGULAR(HUMAN) IN NACL 100-0.9 UT/100ML-% IV SOLN: INTRAVENOUS | Status: DC

## 2018-10-17 MED ORDER — PROPOFOL 10 MG/ML IV BOLUS
INTRAVENOUS | Status: AC
  Filled 2018-10-17: qty 20

## 2018-10-17 MED ORDER — SODIUM BICARBONATE 8.4 % IV SOLN
50.0000 meq | Freq: Once | INTRAVENOUS | Status: AC
  Administered 2018-10-17: 50 meq via INTRAVENOUS

## 2018-10-17 MED ORDER — HEPARIN SODIUM (PORCINE) 1000 UNIT/ML IJ SOLN
INTRAMUSCULAR | Status: AC
  Filled 2018-10-17: qty 1

## 2018-10-17 MED ORDER — ACETAMINOPHEN 500 MG PO TABS
1000.0000 mg | ORAL_TABLET | Freq: Four times a day (QID) | ORAL | Status: DC
  Administered 2018-10-17 – 2018-10-22 (×17): 1000 mg via ORAL
  Filled 2018-10-17 (×19): qty 2

## 2018-10-17 MED ORDER — METOPROLOL TARTRATE 12.5 MG HALF TABLET
12.5000 mg | ORAL_TABLET | Freq: Two times a day (BID) | ORAL | Status: DC
  Administered 2018-10-17 – 2018-10-18 (×3): 12.5 mg via ORAL
  Filled 2018-10-17 (×3): qty 1

## 2018-10-17 MED ORDER — MAGNESIUM SULFATE 4 GM/100ML IV SOLN
4.0000 g | Freq: Once | INTRAVENOUS | Status: AC
  Administered 2018-10-17: 4 g via INTRAVENOUS
  Filled 2018-10-17: qty 100

## 2018-10-17 MED ORDER — ROCURONIUM BROMIDE 10 MG/ML (PF) SYRINGE
PREFILLED_SYRINGE | INTRAVENOUS | Status: AC
  Filled 2018-10-17: qty 30

## 2018-10-17 MED ORDER — ASPIRIN 81 MG PO CHEW
324.0000 mg | CHEWABLE_TABLET | Freq: Every day | ORAL | Status: DC
  Filled 2018-10-17 (×3): qty 4

## 2018-10-17 MED ORDER — ALBUMIN HUMAN 5 % IV SOLN
250.0000 mL | INTRAVENOUS | Status: AC | PRN
  Administered 2018-10-17 (×3): 12.5 g via INTRAVENOUS
  Filled 2018-10-17: qty 500

## 2018-10-17 MED ORDER — SODIUM CHLORIDE 0.9 % IV SOLN
INTRAVENOUS | Status: DC | PRN
  Administered 2018-10-17: 20 ug/min via INTRAVENOUS

## 2018-10-17 MED ORDER — HEPARIN SODIUM (PORCINE) 1000 UNIT/ML IJ SOLN
INTRAMUSCULAR | Status: DC | PRN
  Administered 2018-10-17: 2000 [IU] via INTRAVENOUS
  Administered 2018-10-17: 36000 [IU] via INTRAVENOUS

## 2018-10-17 MED ORDER — PROPOFOL 10 MG/ML IV BOLUS
INTRAVENOUS | Status: DC | PRN
  Administered 2018-10-17: 80 mg via INTRAVENOUS

## 2018-10-17 MED ORDER — EPHEDRINE 5 MG/ML INJ
INTRAVENOUS | Status: AC
  Filled 2018-10-17: qty 10

## 2018-10-17 MED ORDER — HEMOSTATIC AGENTS (NO CHARGE) OPTIME
TOPICAL | Status: DC | PRN
  Administered 2018-10-17: 1 via TOPICAL

## 2018-10-17 MED ORDER — MIDAZOLAM HCL (PF) 10 MG/2ML IJ SOLN
INTRAMUSCULAR | Status: AC
  Filled 2018-10-17: qty 2

## 2018-10-17 MED ORDER — BISACODYL 5 MG PO TBEC
10.0000 mg | DELAYED_RELEASE_TABLET | Freq: Every day | ORAL | Status: DC
  Administered 2018-10-18: 10 mg via ORAL
  Filled 2018-10-17: qty 2

## 2018-10-17 MED ORDER — ALBUMIN HUMAN 5 % IV SOLN
INTRAVENOUS | Status: DC | PRN
  Administered 2018-10-17: 12:00:00 via INTRAVENOUS

## 2018-10-17 MED ORDER — LACTATED RINGERS IV SOLN
INTRAVENOUS | Status: DC | PRN
  Administered 2018-10-17 (×2): via INTRAVENOUS

## 2018-10-17 MED ORDER — SODIUM CHLORIDE 0.9% FLUSH: 3.0000 mL | INTRAVENOUS | Status: DC | PRN

## 2018-10-17 MED ORDER — BISACODYL 10 MG RE SUPP: 10.0000 mg | Freq: Every day | RECTAL | Status: DC

## 2018-10-17 MED ORDER — ACETAMINOPHEN 650 MG RE SUPP
650.0000 mg | Freq: Once | RECTAL | Status: AC
  Administered 2018-10-17: 650 mg via RECTAL

## 2018-10-17 MED ORDER — FENTANYL CITRATE (PF) 250 MCG/5ML IJ SOLN
INTRAMUSCULAR | Status: AC
  Filled 2018-10-17: qty 5

## 2018-10-17 MED ORDER — SODIUM CHLORIDE 0.9 % IV SOLN
INTRAVENOUS | Status: DC | PRN
  Administered 2018-10-17: 750 mg via INTRAVENOUS

## 2018-10-17 MED ORDER — ONDANSETRON HCL 4 MG/2ML IJ SOLN
4.0000 mg | Freq: Four times a day (QID) | INTRAMUSCULAR | Status: DC | PRN
  Administered 2018-10-18: 4 mg via INTRAVENOUS
  Filled 2018-10-17: qty 2

## 2018-10-17 MED ORDER — SUCCINYLCHOLINE CHLORIDE 200 MG/10ML IV SOSY
PREFILLED_SYRINGE | INTRAVENOUS | Status: DC | PRN
  Administered 2018-10-17: 160 mg via INTRAVENOUS

## 2018-10-17 MED ORDER — SODIUM CHLORIDE 0.9 % IV SOLN: 250.0000 mL | INTRAVENOUS | Status: DC

## 2018-10-17 MED ORDER — DOCUSATE SODIUM 100 MG PO CAPS
200.0000 mg | ORAL_CAPSULE | Freq: Every day | ORAL | Status: DC
  Administered 2018-10-18: 200 mg via ORAL
  Filled 2018-10-17: qty 2

## 2018-10-17 MED ORDER — FENTANYL CITRATE (PF) 250 MCG/5ML IJ SOLN
INTRAMUSCULAR | Status: AC
  Filled 2018-10-17: qty 25

## 2018-10-17 MED ORDER — PROTAMINE SULFATE 10 MG/ML IV SOLN
INTRAVENOUS | Status: DC | PRN
  Administered 2018-10-17: 360 mg via INTRAVENOUS

## 2018-10-17 MED ORDER — CHLORHEXIDINE GLUCONATE 0.12 % MT SOLN
15.0000 mL | OROMUCOSAL | Status: AC
  Administered 2018-10-17: 15 mL via OROMUCOSAL
  Filled 2018-10-17: qty 15

## 2018-10-17 MED ORDER — VANCOMYCIN HCL IN DEXTROSE 1-5 GM/200ML-% IV SOLN
1000.0000 mg | Freq: Once | INTRAVENOUS | Status: AC
  Administered 2018-10-17: 1000 mg via INTRAVENOUS
  Filled 2018-10-17: qty 200

## 2018-10-17 MED ORDER — INSULIN REGULAR BOLUS VIA INFUSION
0.0000 [IU] | Freq: Three times a day (TID) | INTRAVENOUS | Status: DC
  Filled 2018-10-17: qty 10

## 2018-10-17 MED ORDER — FAMOTIDINE IN NACL 20-0.9 MG/50ML-% IV SOLN
20.0000 mg | Freq: Two times a day (BID) | INTRAVENOUS | Status: DC
  Administered 2018-10-17: 20 mg via INTRAVENOUS

## 2018-10-17 MED ORDER — SODIUM CHLORIDE 0.9% FLUSH
3.0000 mL | Freq: Two times a day (BID) | INTRAVENOUS | Status: DC
  Administered 2018-10-18: 3 mL via INTRAVENOUS

## 2018-10-17 MED ORDER — SODIUM CHLORIDE 0.9 % IV SOLN
1.5000 g | Freq: Two times a day (BID) | INTRAVENOUS | Status: AC
  Administered 2018-10-17 – 2018-10-19 (×4): 1.5 g via INTRAVENOUS
  Filled 2018-10-17 (×4): qty 1.5

## 2018-10-17 MED ORDER — TRAMADOL HCL 50 MG PO TABS: 50.0000 mg | ORAL_TABLET | ORAL | Status: DC | PRN

## 2018-10-17 MED ORDER — PANTOPRAZOLE SODIUM 40 MG PO TBEC: 40.0000 mg | DELAYED_RELEASE_TABLET | Freq: Every day | ORAL | Status: DC

## 2018-10-17 MED ORDER — ASPIRIN EC 325 MG PO TBEC
325.0000 mg | DELAYED_RELEASE_TABLET | Freq: Every day | ORAL | Status: DC
  Administered 2018-10-18 – 2018-10-22 (×5): 325 mg via ORAL
  Filled 2018-10-17 (×5): qty 1

## 2018-10-17 MED ORDER — SODIUM CHLORIDE 0.9 % IV SOLN
INTRAVENOUS | Status: DC
  Administered 2018-10-17: 20 mL via INTRAVENOUS

## 2018-10-17 SURGICAL SUPPLY — 84 items
BAG DECANTER FOR FLEXI CONT (MISCELLANEOUS) ×4 IMPLANT
BANDAGE ACE 4X5 VEL STRL LF (GAUZE/BANDAGES/DRESSINGS) ×4 IMPLANT
BANDAGE ACE 6X5 VEL STRL LF (GAUZE/BANDAGES/DRESSINGS) ×4 IMPLANT
BANDAGE ELASTIC 4 VELCRO ST LF (GAUZE/BANDAGES/DRESSINGS) ×2 IMPLANT
BANDAGE ELASTIC 6 VELCRO ST LF (GAUZE/BANDAGES/DRESSINGS) ×2 IMPLANT
BASKET HEART  (ORDER IN 25'S) (MISCELLANEOUS) ×1
BASKET HEART (ORDER IN 25'S) (MISCELLANEOUS) ×1
BASKET HEART (ORDER IN 25S) (MISCELLANEOUS) ×2 IMPLANT
BLADE STERNUM SYSTEM 6 (BLADE) ×4 IMPLANT
BNDG GAUZE ELAST 4 BULKY (GAUZE/BANDAGES/DRESSINGS) ×4 IMPLANT
CANISTER SUCT 3000ML PPV (MISCELLANEOUS) ×4 IMPLANT
CANNULA EZ GLIDE 8.0 24FR (CANNULA) ×2 IMPLANT
CANNULA EZ GLIDE AORTIC 21FR (CANNULA) ×4 IMPLANT
CATH CPB KIT HENDRICKSON (MISCELLANEOUS) ×4 IMPLANT
CATH ROBINSON RED A/P 18FR (CATHETERS) ×4 IMPLANT
CATH THORACIC 36FR (CATHETERS) ×4 IMPLANT
CATH THORACIC 36FR RT ANG (CATHETERS) ×4 IMPLANT
CLIP VESOCCLUDE MED 24/CT (CLIP) IMPLANT
CLIP VESOCCLUDE SM WIDE 24/CT (CLIP) ×6 IMPLANT
COVER WAND RF STERILE (DRAPES) ×2 IMPLANT
CRADLE DONUT ADULT HEAD (MISCELLANEOUS) ×4 IMPLANT
DRAPE CARDIOVASCULAR INCISE (DRAPES) ×2
DRAPE SLUSH/WARMER DISC (DRAPES) ×4 IMPLANT
DRAPE SRG 135X102X78XABS (DRAPES) ×2 IMPLANT
DRSG COVADERM 4X14 (GAUZE/BANDAGES/DRESSINGS) ×4 IMPLANT
ELECT REM PT RETURN 9FT ADLT (ELECTROSURGICAL) ×8
ELECTRODE REM PT RTRN 9FT ADLT (ELECTROSURGICAL) ×4 IMPLANT
FELT TEFLON 1X6 (MISCELLANEOUS) ×8 IMPLANT
GAUZE SPONGE 4X4 12PLY STRL (GAUZE/BANDAGES/DRESSINGS) ×8 IMPLANT
GAUZE SPONGE 4X4 12PLY STRL LF (GAUZE/BANDAGES/DRESSINGS) ×4 IMPLANT
GLOVE SURG SIGNA 7.5 PF LTX (GLOVE) ×14 IMPLANT
GOWN STRL REUS W/ TWL LRG LVL3 (GOWN DISPOSABLE) ×8 IMPLANT
GOWN STRL REUS W/ TWL XL LVL3 (GOWN DISPOSABLE) ×4 IMPLANT
GOWN STRL REUS W/TWL LRG LVL3 (GOWN DISPOSABLE) ×16
GOWN STRL REUS W/TWL XL LVL3 (GOWN DISPOSABLE) ×6
HEMOSTAT POWDER SURGIFOAM 1G (HEMOSTASIS) ×12 IMPLANT
HEMOSTAT SURGICEL 2X14 (HEMOSTASIS) ×4 IMPLANT
INSERT FOGARTY XLG (MISCELLANEOUS) IMPLANT
KIT BASIN OR (CUSTOM PROCEDURE TRAY) ×4 IMPLANT
KIT SUCTION CATH 14FR (SUCTIONS) ×8 IMPLANT
KIT TURNOVER KIT B (KITS) ×4 IMPLANT
KIT VASOVIEW HEMOPRO 2 VH 4000 (KITS) ×4 IMPLANT
MARKER GRAFT CORONARY BYPASS (MISCELLANEOUS) ×12 IMPLANT
NS IRRIG 1000ML POUR BTL (IV SOLUTION) ×22 IMPLANT
PACK E OPEN HEART (SUTURE) ×4 IMPLANT
PACK OPEN HEART (CUSTOM PROCEDURE TRAY) ×4 IMPLANT
PAD ARMBOARD 7.5X6 YLW CONV (MISCELLANEOUS) ×8 IMPLANT
PAD ELECT DEFIB RADIOL ZOLL (MISCELLANEOUS) ×4 IMPLANT
PENCIL BUTTON HOLSTER BLD 10FT (ELECTRODE) ×4 IMPLANT
PUNCH AORTIC ROTATE 4.0MM (MISCELLANEOUS) IMPLANT
PUNCH AORTIC ROTATE 4.5MM 8IN (MISCELLANEOUS) ×2 IMPLANT
PUNCH AORTIC ROTATE 5MM 8IN (MISCELLANEOUS) IMPLANT
SET CARDIOPLEGIA MPS 5001102 (MISCELLANEOUS) ×2 IMPLANT
SPONGE LAP 4X18 RFD (DISPOSABLE) ×2 IMPLANT
SUT BONE WAX W31G (SUTURE) ×4 IMPLANT
SUT MNCRL AB 4-0 PS2 18 (SUTURE) IMPLANT
SUT PROLENE 3 0 SH DA (SUTURE) ×4 IMPLANT
SUT PROLENE 4 0 RB 1 (SUTURE)
SUT PROLENE 4 0 SH DA (SUTURE) IMPLANT
SUT PROLENE 4-0 RB1 .5 CRCL 36 (SUTURE) IMPLANT
SUT PROLENE 6 0 C 1 30 (SUTURE) ×16 IMPLANT
SUT PROLENE 7 0 BV1 MDA (SUTURE) ×6 IMPLANT
SUT PROLENE 8 0 BV175 6 (SUTURE) ×2 IMPLANT
SUT STEEL 6MS V (SUTURE) ×4 IMPLANT
SUT STEEL STERNAL CCS#1 18IN (SUTURE) IMPLANT
SUT STEEL SZ 6 DBL 3X14 BALL (SUTURE) ×4 IMPLANT
SUT VIC AB 1 CTX 36 (SUTURE) ×6
SUT VIC AB 1 CTX36XBRD ANBCTR (SUTURE) ×4 IMPLANT
SUT VIC AB 2-0 CT1 27 (SUTURE) ×2
SUT VIC AB 2-0 CT1 TAPERPNT 27 (SUTURE) IMPLANT
SUT VIC AB 2-0 CTX 27 (SUTURE) IMPLANT
SUT VIC AB 3-0 SH 27 (SUTURE)
SUT VIC AB 3-0 SH 27X BRD (SUTURE) IMPLANT
SUT VIC AB 3-0 X1 27 (SUTURE) ×2 IMPLANT
SUT VICRYL 4-0 PS2 18IN ABS (SUTURE) IMPLANT
SYSTEM SAHARA CHEST DRAIN ATS (WOUND CARE) ×4 IMPLANT
TAPE CLOTH SURG 4X10 WHT LF (GAUZE/BANDAGES/DRESSINGS) ×2 IMPLANT
TAPE PAPER 2X10 WHT MICROPORE (GAUZE/BANDAGES/DRESSINGS) ×2 IMPLANT
TOWEL GREEN STERILE (TOWEL DISPOSABLE) ×4 IMPLANT
TOWEL GREEN STERILE FF (TOWEL DISPOSABLE) ×4 IMPLANT
TRAY FOLEY SLVR 16FR TEMP STAT (SET/KITS/TRAYS/PACK) ×4 IMPLANT
TUBING LAP HI FLOW INSUFFLATIO (TUBING) ×4 IMPLANT
UNDERPAD 30X30 (UNDERPADS AND DIAPERS) ×4 IMPLANT
WATER STERILE IRR 1000ML POUR (IV SOLUTION) ×8 IMPLANT

## 2018-10-17 NOTE — Progress Notes (Signed)
Patient chart reviewed. Upon attempting rounds, patient has already been taken down for CABG. Patient likely to be assumed by cardiothoracic surgery upon completion of surgery. Will follow if needed.  Cordelia Poche, MD [facility] 10/17/2018, 7:43 AM

## 2018-10-17 NOTE — Progress Notes (Signed)
  Echocardiogram Echocardiogram Transesophageal has been performed.  Larry Pham 10/17/2018, 9:13 AM

## 2018-10-17 NOTE — Anesthesia Procedure Notes (Signed)
Procedure Name: Intubation Date/Time: 10/17/2018 8:47 AM Performed by: Shirlyn Goltz, CRNA Pre-anesthesia Checklist: Patient identified, Emergency Drugs available, Suction available and Patient being monitored Patient Re-evaluated:Patient Re-evaluated prior to induction Oxygen Delivery Method: Circle system utilized Preoxygenation: Pre-oxygenation with 100% oxygen Induction Type: IV induction, Rapid sequence and Cricoid Pressure applied Laryngoscope Size: Mac and 4 Grade View: Grade III Tube type: Oral Tube size: 8.0 mm Number of attempts: 1 Airway Equipment and Method: Stylet Placement Confirmation: ETT inserted through vocal cords under direct vision,  positive ETCO2 and breath sounds checked- equal and bilateral Secured at: 23 cm Tube secured with: Tape Dental Injury: Teeth and Oropharynx as per pre-operative assessment

## 2018-10-17 NOTE — Anesthesia Preprocedure Evaluation (Signed)
Anesthesia Evaluation  Patient identified by MRN, date of birth, ID band Patient awake    Reviewed: Allergy & Precautions, NPO status , Patient's Chart, lab work & pertinent test results  Airway Mallampati: I  TM Distance: >3 FB Neck ROM: Full    Dental   Pulmonary Current Smoker,    Pulmonary exam normal        Cardiovascular hypertension, Pt. on medications + CAD, + Past MI and + Cardiac Stents  Normal cardiovascular exam     Neuro/Psych CVA    GI/Hepatic GERD  Medicated and Controlled,  Endo/Other    Renal/GU Renal InsufficiencyRenal disease     Musculoskeletal   Abdominal   Peds  Hematology   Anesthesia Other Findings   Reproductive/Obstetrics                             Anesthesia Physical Anesthesia Plan  ASA: III  Anesthesia Plan: General   Post-op Pain Management:    Induction:   PONV Risk Score and Plan: 1 and Treatment may vary due to age or medical condition  Airway Management Planned: Oral ETT  Additional Equipment: Arterial line, CVP, PA Cath, TEE and Ultrasound Guidance Line Placement  Intra-op Plan:   Post-operative Plan: Post-operative intubation/ventilation  Informed Consent: I have reviewed the patients History and Physical, chart, labs and discussed the procedure including the risks, benefits and alternatives for the proposed anesthesia with the patient or authorized representative who has indicated his/her understanding and acceptance.       Plan Discussed with: CRNA and Surgeon  Anesthesia Plan Comments:         Anesthesia Quick Evaluation

## 2018-10-17 NOTE — Anesthesia Postprocedure Evaluation (Signed)
Anesthesia Post Note  Patient: Larry Pham  Procedure(s) Performed: CORONARY ARTERY BYPASS GRAFTING (CABG) TIMES THREE USING LEFT INTERNAL MAMMARY ARTERY AND GREATER SAPHENOUS VEIN COLLECTED ENDOSCOPICALLY (N/A Chest) TRANSESOPHAGEAL ECHOCARDIOGRAM (TEE) (N/A )     Patient location during evaluation: SICU Anesthesia Type: General Level of consciousness: sedated Pain management: pain level controlled Vital Signs Assessment: post-procedure vital signs reviewed and stable Respiratory status: patient remains intubated per anesthesia plan Cardiovascular status: stable Postop Assessment: no apparent nausea or vomiting Anesthetic complications: no    Last Vitals:  Vitals:   10/17/18 1850 10/17/18 1900  BP:  112/73  Pulse:  89  Resp:  (!) 30  Temp:  (!) 35.9 C  SpO2: 97% 98%    Last Pain:  Vitals:   10/17/18 0535  TempSrc: Oral  PainSc:                  Larry Pham

## 2018-10-17 NOTE — Plan of Care (Signed)
  Problem: Activity: Goal: Ability to tolerate increased activity will improve Outcome: Completed/Met   Problem: Respiratory: Goal: Ability to maintain a clear airway and adequate ventilation will improve Outcome: Completed/Met   Problem: Role Relationship: Goal: Method of communication will improve Outcome: Completed/Met

## 2018-10-17 NOTE — Interval H&P Note (Signed)
History and Physical Interval Note:  10/17/2018 8:01 AM  Larry Pham  has presented today for surgery, with the diagnosis of CAD.  The various methods of treatment have been discussed with the patient and family. After consideration of risks, benefits and other options for treatment, the patient has consented to  Procedure(s): CORONARY ARTERY BYPASS GRAFTING (CABG) (N/A) TRANSESOPHAGEAL ECHOCARDIOGRAM (TEE) (N/A) as a surgical intervention.  The patient's history has been reviewed, patient examined, no change in status, stable for surgery.  I have reviewed the patient's chart and labs.  Questions were answered to the patient's satisfaction.     Melrose Nakayama

## 2018-10-17 NOTE — Anesthesia Procedure Notes (Signed)
Central Venous Catheter Insertion Performed by: Roberts Gaudy, MD, anesthesiologist Start/End6/02/2019 8:10 AM, 10/17/2018 8:20 AM Patient location: Pre-op. Preanesthetic checklist: patient identified, IV checked, site marked, risks and benefits discussed, surgical consent, monitors and equipment checked, pre-op evaluation, timeout performed and anesthesia consent Position: supine Hand hygiene performed  and maximum sterile barriers used  PA cath was placed.Swan type:thermodilution Procedure performed without using ultrasound guided technique. Attempts: 1 Following insertion, line sutured, dressing applied and Biopatch. Post procedure assessment: blood return through all ports, free fluid flow and no air  Patient tolerated the procedure well with no immediate complications.

## 2018-10-17 NOTE — Progress Notes (Signed)
      [address]       [place] [address]             [phone]      Starting to wake up and follow commands  BP 134/89   Pulse 88   Temp (!) 97.3 F (36.3 C)   Resp 20   Ht 5\' 11"  (1.803 m)   Wt 106.4 kg   SpO2 99%   BMI 32.71 kg/m  31/14 CI= 1.6  Intake/Output Summary (Last 24 hours) at 10/17/2018 1808 Last data filed at 10/17/2018 1700 Gross per 24 hour  Intake 3049.89 ml  Output 1865 ml  Net 1184.89 ml   He is under resuscitated at present- will give na amp of bicarb to correct acidosis, atrial pace at 90 and give albumin to increase filling pressures  Jalaysia Lobb C. Roxan Hockey, MD [facility] [phone]

## 2018-10-17 NOTE — Anesthesia Procedure Notes (Signed)
Arterial Line Insertion Start/End6/02/2019 7:45 AM, 10/17/2018 7:50 AM Performed by: Shirlyn Goltz, CRNA, CRNA  Patient location: Pre-op. Preanesthetic checklist: patient identified, IV checked, site marked, risks and benefits discussed, surgical consent, monitors and equipment checked, pre-op evaluation and anesthesia consent Patient sedated Left, radial was placed Catheter size: 20 G Hand hygiene performed , maximum sterile barriers used  and Seldinger technique used Allen's test indicative of satisfactory collateral circulation Attempts: 1 Procedure performed without using ultrasound guided technique. Following insertion, dressing applied and Biopatch. Post procedure assessment: normal

## 2018-10-17 NOTE — Progress Notes (Signed)
Report Called to OR @ [id number]  Pt given 10mg  of Hydralazine IV for a b/p of 194/107  bp on recheck was 140/ 83/

## 2018-10-17 NOTE — Transfer of Care (Signed)
Immediate Anesthesia Transfer of Care Note  Patient: Ahmaud Duthie Willingham  Procedure(s) Performed: CORONARY ARTERY BYPASS GRAFTING (CABG) TIMES THREE USING LEFT INTERNAL MAMMARY ARTERY AND GREATER SAPHENOUS VEIN COLLECTED ENDOSCOPICALLY (N/A Chest) TRANSESOPHAGEAL ECHOCARDIOGRAM (TEE) (N/A )  Patient Location: ICU  Anesthesia Type:General  Level of Consciousness: sedated and Patient remains intubated per anesthesia plan  Airway & Oxygen Therapy: Patient remains intubated per anesthesia plan and Patient placed on Ventilator (see vital sign flow sheet for setting)  Post-op Assessment: Report given to RN and Post -op Vital signs reviewed and stable ABP 101/53; 75 nsr; spo2 98%   Post vital signs: Reviewed and stable  Last Vitals:  Vitals Value Taken Time  BP    Temp    Pulse    Resp    SpO2      Last Pain:  Vitals:   10/17/18 0535  TempSrc: Oral  PainSc:       Patients Stated Pain Goal: 0 (83/29/19 1660)  Complications: No apparent anesthesia complications

## 2018-10-17 NOTE — Procedures (Signed)
Extubation Procedure Note  Patient Details:   Name: Larry Pham DOB: Nov 13, 1965 MRN: [id number]   Airway Documentation:    Vent end date: (not recorded) Vent end time: (not recorded)   Evaluation  O2 sats: stable throughout Complications: No apparent complications Patient did tolerate procedure well. Bilateral Breath Sounds: Clear, Diminished   Yes   Extubated to 4L [place] with no complications per wean Protocol. NIF and VC within normal range. IS 750x5   Levora Dredge 10/17/2018, 6:51 PM

## 2018-10-17 NOTE — Anesthesia Procedure Notes (Signed)
Central Venous Catheter Insertion Performed by: Roberts Gaudy, MD, anesthesiologist Start/End6/02/2019 8:10 AM, 10/17/2018 8:20 AM Patient location: Pre-op. Preanesthetic checklist: patient identified, IV checked, site marked, risks and benefits discussed, surgical consent, monitors and equipment checked, pre-op evaluation, timeout performed and anesthesia consent Lidocaine 1% used for infiltration and patient sedated Hand hygiene performed  and maximum sterile barriers used  Catheter size: 9 Fr Sheath introducer Procedure performed using ultrasound guided technique. Ultrasound Notes:anatomy identified, needle tip was noted to be adjacent to the nerve/plexus identified, no ultrasound evidence of intravascular and/or intraneural injection and image(s) printed for medical record Attempts: 1 Following insertion, line sutured and dressing applied. Post procedure assessment: blood return through all ports, free fluid flow and no air  Patient tolerated the procedure well with no immediate complications.

## 2018-10-17 NOTE — Brief Op Note (Signed)
10/13/2018 - 10/17/2018  1:57 PM  PATIENT:  Leonie Man Arroyave  53 y.o. male  PRE-OPERATIVE DIAGNOSIS:  CAD  POST-OPERATIVE DIAGNOSIS:  CAD  PROCEDURE:  Procedure(s): CORONARY ARTERY BYPASS GRAFTING (CABG) TIMES THREE USING LEFT INTERNAL MAMMARY ARTERY AND GREATER SAPHENOUS VEIN COLLECTED ENDOSCOPICALLY (N/A) TRANSESOPHAGEAL ECHOCARDIOGRAM (TEE) (N/A) LIMA-LAD SVG-OM SVG-PD  SURGEON:  Surgeon(s) and Role:    * Melrose Nakayama, MD - Primary  PHYSICIAN ASSISTANT: WAYNE GOLD PA-C  ANESTHESIA:   general  EBL:  500 ml  BLOOD ADMINISTERED:none  DRAINS: PLEURAL AND PERICARDIAL CHEST TUBES   LOCAL MEDICATIONS USED:  NONE  SPECIMEN:  No Specimen  DISPOSITION OF SPECIMEN:  N/A  COUNTS:  YES  TOURNIQUET:  * No tourniquets in log *  DICTATION: .Other Dictation: Dictation Number [id number]  PLAN OF CARE: Admit to inpatient   PATIENT DISPOSITION:  ICU - intubated and hemodynamically stable.   Delay start of Pharmacological VTE agent (>24hrs) due to surgical blood loss or risk of bleeding: yes

## 2018-10-18 ENCOUNTER — Inpatient Hospital Stay (HOSPITAL_COMMUNITY): Payer: BC Managed Care – PPO

## 2018-10-18 ENCOUNTER — Encounter (HOSPITAL_COMMUNITY): Payer: Self-pay | Admitting: Thoracic Surgery (Cardiothoracic Vascular Surgery)

## 2018-10-18 LAB — BASIC METABOLIC PANEL
Anion gap: 8 (ref 5–15)
Anion gap: 7 (ref 5–15)
BUN: 13 mg/dL (ref 6–20)
BUN: 18 mg/dL (ref 6–20)
CO2: 21 mmol/L — ABNORMAL LOW (ref 22–32)
CO2: 21 mmol/L — ABNORMAL LOW (ref 22–32)
Calcium: 8.3 mg/dL — ABNORMAL LOW (ref 8.9–10.3)
Calcium: 8.5 mg/dL — ABNORMAL LOW (ref 8.9–10.3)
Chloride: 107 mmol/L (ref 98–111)
Chloride: 106 mmol/L (ref 98–111)
Creatinine, Ser: 1.01 mg/dL (ref 0.61–1.24)
Creatinine, Ser: 1.15 mg/dL (ref 0.61–1.24)
GFR calc Af Amer: >60 mL/min (ref >60)
GFR calc Af Amer: >60 mL/min (ref >60)
GFR calc non Af Amer: >60 mL/min (ref >60)
GFR calc non Af Amer: >60 mL/min (ref >60)
Glucose, Bld: 123 mg/dL — ABNORMAL HIGH (ref 70–99)
Glucose, Bld: 159 mg/dL — ABNORMAL HIGH (ref 70–99)
Potassium: 4.5 mmol/L (ref 3.5–5.1)
Potassium: 4.2 mmol/L (ref 3.5–5.1)
Sodium: 136 mmol/L (ref 135–145)
Sodium: 134 mmol/L — ABNORMAL LOW (ref 135–145)

## 2018-10-18 LAB — GLUCOSE, CAPILLARY
Glucose-Capillary: 102 mg/dL — ABNORMAL HIGH (ref 70–99)
Glucose-Capillary: 103 mg/dL — ABNORMAL HIGH (ref 70–99)
Glucose-Capillary: 113 mg/dL — ABNORMAL HIGH (ref 70–99)
Glucose-Capillary: 125 mg/dL — ABNORMAL HIGH (ref 70–99)
Glucose-Capillary: 127 mg/dL — ABNORMAL HIGH (ref 70–99)
Glucose-Capillary: 130 mg/dL — ABNORMAL HIGH (ref 70–99)
Glucose-Capillary: 96 mg/dL (ref 70–99)

## 2018-10-18 LAB — CBC
HCT: 35.5 % — ABNORMAL LOW (ref 39.0–52.0)
HCT: 33.8 % — ABNORMAL LOW (ref 39.0–52.0)
Hemoglobin: 11.7 g/dL — ABNORMAL LOW (ref 13.0–17.0)
Hemoglobin: 11 g/dL — ABNORMAL LOW (ref 13.0–17.0)
MCH: 31.5 pg (ref 26.0–34.0)
MCH: 31.3 pg (ref 26.0–34.0)
MCHC: 33 g/dL (ref 30.0–36.0)
MCHC: 32.5 g/dL (ref 30.0–36.0)
MCV: 95.4 fL (ref 80.0–100.0)
MCV: 96.3 fL (ref 80.0–100.0)
Platelets: 172 10*3/uL (ref 150–400)
Platelets: 170 10*3/uL (ref 150–400)
RBC: 3.72 MIL/uL — ABNORMAL LOW (ref 4.22–5.81)
RBC: 3.51 MIL/uL — ABNORMAL LOW (ref 4.22–5.81)
RDW: 13.7 % (ref 11.5–15.5)
RDW: 13.9 % (ref 11.5–15.5)
WBC: 18.1 10*3/uL — ABNORMAL HIGH (ref 4.0–10.5)
WBC: 15.8 10*3/uL — ABNORMAL HIGH (ref 4.0–10.5)
nRBC: 0 % (ref 0.0–0.2)
nRBC: 0 % (ref 0.0–0.2)

## 2018-10-18 LAB — POCT I-STAT 4, (NA,K, GLUC, HGB,HCT)
Glucose, Bld: 113 mg/dL — ABNORMAL HIGH (ref 70–99)
HCT: 33 % — ABNORMAL LOW (ref 39.0–52.0)
Hemoglobin: 11.2 g/dL — ABNORMAL LOW (ref 13.0–17.0)
Potassium: 4.6 mmol/L (ref 3.5–5.1)
Sodium: 138 mmol/L (ref 135–145)

## 2018-10-18 LAB — MAGNESIUM
Magnesium: 2.7 mg/dL — ABNORMAL HIGH (ref 1.7–2.4)
Magnesium: 2.5 mg/dL — ABNORMAL HIGH (ref 1.7–2.4)

## 2018-10-18 MED ORDER — ENOXAPARIN SODIUM 40 MG/0.4ML ~~LOC~~ SOLN
40.0000 mg | Freq: Every day | SUBCUTANEOUS | Status: DC
  Administered 2018-10-18: 40 mg via SUBCUTANEOUS
  Filled 2018-10-18: qty 0.4

## 2018-10-18 MED ORDER — ALFUZOSIN HCL ER 10 MG PO TB24
10.0000 mg | ORAL_TABLET | Freq: Every day | ORAL | Status: DC
  Administered 2018-10-18 – 2018-10-22 (×5): 10 mg via ORAL
  Filled 2018-10-18 (×6): qty 1

## 2018-10-18 MED ORDER — CILOSTAZOL 50 MG PO TABS
50.0000 mg | ORAL_TABLET | Freq: Two times a day (BID) | ORAL | Status: DC
  Administered 2018-10-18 – 2018-10-22 (×9): 50 mg via ORAL
  Filled 2018-10-18 (×12): qty 1

## 2018-10-18 MED ORDER — SODIUM CHLORIDE 0.9% FLUSH: 10.0000 mL | INTRAVENOUS | Status: DC | PRN

## 2018-10-18 MED ORDER — SODIUM CHLORIDE 0.9% FLUSH
10.0000 mL | Freq: Two times a day (BID) | INTRAVENOUS | Status: DC
  Administered 2018-10-18: 40 mL

## 2018-10-18 MED ORDER — TIZANIDINE HCL 4 MG PO TABS
4.0000 mg | ORAL_TABLET | Freq: Every day | ORAL | Status: DC
  Administered 2018-10-18 – 2018-10-22 (×5): 4 mg via ORAL
  Filled 2018-10-18 (×5): qty 1

## 2018-10-18 MED ORDER — INSULIN ASPART 100 UNIT/ML ~~LOC~~ SOLN: 0.0000 [IU] | SUBCUTANEOUS | Status: DC

## 2018-10-18 MED ORDER — GABAPENTIN 600 MG PO TABS
600.0000 mg | ORAL_TABLET | Freq: Three times a day (TID) | ORAL | Status: DC
  Administered 2018-10-18 – 2018-10-22 (×13): 600 mg via ORAL
  Filled 2018-10-18 (×13): qty 1

## 2018-10-18 MED ORDER — CHLORHEXIDINE GLUCONATE CLOTH 2 % EX PADS
6.0000 | MEDICATED_PAD | Freq: Every day | CUTANEOUS | Status: DC
  Administered 2018-10-18: 6 via TOPICAL

## 2018-10-18 MED ORDER — METOCLOPRAMIDE HCL 5 MG/ML IJ SOLN
10.0000 mg | Freq: Four times a day (QID) | INTRAMUSCULAR | Status: DC
  Administered 2018-10-18 – 2018-10-19 (×3): 10 mg via INTRAVENOUS
  Filled 2018-10-18 (×3): qty 2

## 2018-10-18 MED ORDER — FUROSEMIDE 10 MG/ML IJ SOLN
20.0000 mg | Freq: Once | INTRAMUSCULAR | Status: AC
  Administered 2018-10-18: 20 mg via INTRAVENOUS
  Filled 2018-10-18: qty 2

## 2018-10-18 MED ORDER — SUMATRIPTAN SUCCINATE 50 MG PO TABS
50.0000 mg | ORAL_TABLET | ORAL | Status: DC | PRN
  Administered 2018-10-19: 50 mg via ORAL
  Filled 2018-10-18 (×2): qty 1

## 2018-10-18 MED FILL — Potassium Chloride Inj 2 mEq/ML: INTRAVENOUS | Qty: 40 | Status: AC

## 2018-10-18 MED FILL — Magnesium Sulfate Inj 50%: INTRAMUSCULAR | Qty: 10 | Status: AC

## 2018-10-18 MED FILL — Heparin Sodium (Porcine) Inj 1000 Unit/ML: INTRAMUSCULAR | Qty: 30 | Status: AC

## 2018-10-18 NOTE — Progress Notes (Signed)
EKG CRITICAL VALUE     12 lead EKG performed.  Critical value noted.  Andee Poles, RN notified.   Genia Plants, CCT 10/18/2018 8:48 AM

## 2018-10-18 NOTE — Op Note (Signed)
NAME: Larry Pham, Larry Pham MEDICAL RECORD WL:[id number] ACCOUNT [id number] DATE OF BIRTH:08/18/1965 FACILITY: [facility] LOCATION: [facility] PHYSICIAN:Nyxon Strupp Chaya Jan, MD  OPERATIVE REPORT  DATE OF PROCEDURE:  10/17/2018  PREOPERATIVE DIAGNOSIS:  Left main and 3-vessel coronary disease with unstable angina.  POSTOPERATIVE DIAGNOSIS:  Left main and 3-vessel coronary disease with unstable angina.  PROCEDURE:  Median sternotomy, extracorporeal circulation, Coronary artery bypass grafting x 3  Left internal mammary artery to left anterior descending,  Saphenous vein graft to obtuse marginal,  Saphenous vein graft to posterior descending.  SURGEON:  Modesto Charon, MD  ASSISTANT:  Jadene Pierini, PA-C   ANESTHESIA:  General.  FINDINGS:  Good quality targets, good quality conduits, preserved left ventricular function with no significant valvular pathology by transesophageal echocardiography.  CLINICAL NOTE:  Larry Pham is a 53 year old gentleman with known coronary disease who presented with unstable chest pain.  He ruled out for an MI.  At catheterization, he was found to have hemodynamically significant distal left main stenosis.  There  was diffuse disease in the right coronary with approximately 50% stenosis in the midvessel.  He was advised to undergo coronary artery bypass grafting.  The indications, risks, benefits, and alternatives were discussed in detail with the patient.  He  understood and accepted the risks and agreed to proceed.  The stenosis was not felt tight enough to support a radial artery or right mammary graft.  OPERATIVE NOTE:  The patient was brought to the preoperative holding area on 10/17/2018.  Anesthesia placed a Swan-Ganz catheter and an arterial blood pressure monitor line.  He was taken to the operating room, anesthetized and intubated.  A Foley  catheter was placed.  Intravenous antibiotics were administered.  The chest, abdomen and legs were prepped and draped in  the usual sterile fashion.  Transesophageal echocardiography was performed by Dr. Lillia Abed.  There was no significant  valvular pathology.  There was mild aortic insufficiency.  There was left ventricular hypertrophy.  There was preserved left ventricular systolic function.  A timeout was performed.  A median sternotomy was performed and the left internal mammary artery was harvested using standard technique.  Simultaneously, an incision was made in the medial aspect of the right leg at the level of the knee.  The greater  saphenous vein was harvested from the right thigh endoscopically.  Both the mammary artery and saphenous vein were good quality vessels.  Heparin, 2000 units, was administered during the vessel harvest.  The remainder of the full heparin dose was given  prior to opening the pericardium,  A sternal retractor was placed and gently opened over time.  The pericardium was opened.  The ascending aorta was inspected.  It was of normal caliber with no atherosclerotic disease.  After confirming adequate anticoagulation with ACT measurement, the aorta was cannulated via concentric 2-0 Ethibond pledgeted pursestring sutures.  A dual-stage venous cannula was placed via a pursestring suture in the right atrial appendage.  Cardiopulmonary bypass was initiated.  Flows were maintained per protocol.   The patient was cooled to 32 degrees Celsius.  The coronary arteries were inspected and anastomotic sites were chosen.  The conduits were inspected and cut to length.  A foam pad was placed in the pericardium to insulate the heart.  A temperature probe  was placed in the myocardial septum, and a cardioplegia cannula was placed in the ascending aorta.  The aorta was cross-clamped.  The left ventricle was emptied via the aortic root vent.  Cardiac arrest was achieved  with a combination of cold antegrade blood cardioplegia and topical iced saline.  One liter of cardioplegia was administered.  There was a   rapid diastolic arrest.  There was septal cooling to 12 degrees Celsius.  A reversed saphenous vein graft was then was placed end to side to the posterior descending.  Both the target and the vein were good quality.  The posterior descending was selected due to significant plaque in the distal right coronary.  The end-to-side  anastomosis was performed with a running 7-0 Prolene suture.  A 1.5 mm probe passed easily proximally and distally at the completion of the anastomosis.  During the anastomosis, the heart became distended, and it was noted that the cross-clamp had  partially come off the aorta.  The cross-clamp was repositioned.  Cardioplegia then was administered down the graft.  There was good flow and good hemostasis.  Additional cardioplegia was also administered via the aortic root.  The heart was elevated, exposing the lateral wall.  A saphenous vein graft was anastomosed end-to-side to the obtuse marginal branch of the left circumflex.  It was a single lateral branch.  This was a 2 mm good quality target.  The vein was  of good quality.  An end-to-side anastomosis was performed with a running 7-0 Prolene suture.  Again, a probe passed easily proximally and distally.  There was good flow and good hemostasis with cardioplegia administration.  The left internal mammary artery then was brought through a window in the pericardium.  The distal end was bevelled.  It was anastomosed end-to-side to the distal LAD.  Both the LAD and mammary were 2 mm good quality vessels.  The end-to-side anastomosis  was performed with a running 8-0 Prolene suture.  After completion of the anastomosis, the bulldog clamp was briefly removed to inspect for hemostasis.  Rapid septal rewarming was noted.  The bulldog clamp was replaced.  The mammary pedicle was tacked to  the epicardial surface of the heart with 6-0 Prolene sutures.  Additional cardioplegia was administered.  The vein grafts were cut to length.  The  cardioplegia cannula was removed from the ascending aorta.  The proximal vein graft anastomoses were  performed to 4.5 mm punch aortotomies with running 6-0 Prolene sutures.  After completion of the second proximal anastomosis, the patient was placed in Trendelenburg position.  Lidocaine was administered.  The aortic root was deaired and the aortic  crossclamp was removed.  The total crossclamp time was 57 minutes.  The patient required a single defibrillation with 10 joules and then was in sinus rhythm thereafter.  While rewarming was completed, all proximal and distal anastomoses were inspected  for hemostasis.  Epicardial pacing wires were placed on the right ventricle and right atrium.  When the patient had rewarmed to a core temperature of 37 degrees Celsius, he was weaned from cardiopulmonary bypass on the first attempt.  He was in sinus  rhythm and no inotropic support at the time of separation from bypass.  Total bypass time was 90 minutes.  Post-bypass transesophageal echocardiography was unchanged from the pre-bypass study.  A test dose of protamine was administered and was well tolerated.  The atrial and aortic cannulae were removed.  The remainder of the protamine was administered without incident.  The chest was irrigated with warm saline.  Hemostasis was achieved.  Left  pleural and mediastinal chest tubes were placed through separate subcostal incisions.  The pericardium was reapproximated over the aorta and base of the heart  with interrupted 3-0 silk sutures and came together easily without tension.  The sternum was  closed with a combination of single and double heavy gauge stainless steel wires.  Pectoralis fascia, subcutaneous tissue and skin were closed in standard fashion.  All sponge, needle and instrument counts were correct at the end of the procedure.  The  patient was taken from the operating room to the surgical intensive care unit intubated and in good condition.  LN/NUANCE   D:10/17/2018 T:10/18/2018 JOB:[id number]

## 2018-10-18 NOTE — Progress Notes (Signed)
1 Day Post-Op Procedure(s) (LRB): CORONARY ARTERY BYPASS GRAFTING (CABG) TIMES THREE USING LEFT INTERNAL MAMMARY ARTERY AND GREATER SAPHENOUS VEIN COLLECTED ENDOSCOPICALLY (N/A) TRANSESOPHAGEAL ECHOCARDIOGRAM (TEE) (N/A) Subjective: Some nausea  Objective: Vital signs in last 24 hours: Temp:  [95.9 F (35.5 C)-98.1 F (36.7 C)] 97.9 F (36.6 C) (06/11 0700) Pulse Rate:  [65-90] 89 (06/11 0700) Cardiac Rhythm: Atrial paced (06/10 2000) Resp:  [12-39] 38 (06/11 0700) BP: (101-134)/(62-89) 131/81 (06/11 0700) SpO2:  [97 %-100 %] 99 % (06/11 0700) Arterial Line BP: (106-151)/(59-83) 138/69 (06/11 0700) FiO2 (%):  [40 %-50 %] 40 % (06/10 1835) Weight:  [110.7 kg] 110.7 kg (06/11 0500)  Hemodynamic parameters for last 24 hours: PAP: (23-56)/(9-27) 35/11 CO:  [3.7 L/min-6.7 L/min] 4.5 L/min CI:  [1.6 L/min/m2-3 L/min/m2] 2 L/min/m2  Intake/Output from previous day: 06/10 0701 - 06/11 0700 In: 4485.5 [P.O.:480; I.V.:3212.7; IV Piggyback:792.9] Out: 2849 [Urine:2355; Chest JZPH:150] Intake/Output this shift: No intake/output data recorded.  see other note  Lab Results: Recent Labs    10/17/18 1945 10/17/18 1949 10/18/18 0338  WBC 14.3*  --  18.1*  HGB 11.3*  11.9* 11.9* 11.7*  HCT 34.3*  35.0* 35.0* 35.5*  PLT 162  --  172   BMET:  Recent Labs    10/17/18 0536  10/17/18 1945 10/17/18 1949 10/18/18 0338  NA 140   < > 137 139 136  K 4.1   < > 4.6 4.7 4.5  CL 108  --  106  --  107  CO2 24  --   --   --  21*  GLUCOSE 100*   < > 117*  --  123*  BUN 14  --  13  --  13  CREATININE 1.16  --  0.90  0.80  --  1.01  CALCIUM 9.4  --   --   --  8.3*   < > = values in this interval not displayed.    PT/INR:  Recent Labs    10/17/18 1323  LABPROT 16.6*  INR 1.4*   ABG    Component Value Date/Time   PHART 7.359 10/17/2018 1949   HCO3 23.6 10/17/2018 1949   TCO2 25 10/17/2018 1949   ACIDBASEDEF 2.0 10/17/2018 1949   O2SAT 99.0 10/17/2018 1949   CBG (last 3)   Recent Labs    10/17/18 2052 10/18/18 0001 10/18/18 0342  GLUCAP 115* 127* 125*    Assessment/Plan: S/P Procedure(s) (LRB): CORONARY ARTERY BYPASS GRAFTING (CABG) TIMES THREE USING LEFT INTERNAL MAMMARY ARTERY AND GREATER SAPHENOUS VEIN COLLECTED ENDOSCOPICALLY (N/A) TRANSESOPHAGEAL ECHOCARDIOGRAM (TEE) (N/A) see other note     LOS: 3 days    Larry Pham 10/18/2018

## 2018-10-18 NOTE — Progress Notes (Signed)
CT surgery p.m. Rounds  Patient examined and record reviewed.Hemodynamics stable,labs satisfactory.Patient had stable day.Continue current care. Tharon Aquas Trigt III 10/18/2018

## 2018-10-18 NOTE — Progress Notes (Addendum)
[facility] DAILY ICU PROGRESS NOTE                   [address]            [place] [address]          [phone]   1 Day Post-Op Procedure(s) (LRB): CORONARY ARTERY BYPASS GRAFTING (CABG) TIMES THREE USING LEFT INTERNAL MAMMARY ARTERY AND GREATER SAPHENOUS VEIN COLLECTED ENDOSCOPICALLY (N/A) TRANSESOPHAGEAL ECHOCARDIOGRAM (TEE) (N/A)  Total Length of Stay:  LOS: 3 days   Subjective: Doing well, minor discomforts  Objective: Vital signs in last 24 hours: Temp:  [95.9 F (35.5 C)-98.1 F (36.7 C)] 97.9 F (36.6 C) (06/11 0700) Pulse Rate:  [65-90] 89 (06/11 0700) Cardiac Rhythm: Atrial paced (06/10 2000) Resp:  [12-39] 38 (06/11 0700) BP: (101-134)/(62-89) 121/86 (06/11 0600) SpO2:  [97 %-100 %] 99 % (06/11 0700) Arterial Line BP: (106-151)/(59-83) 138/69 (06/11 0700) FiO2 (%):  [40 %-50 %] 40 % (06/10 1835) Weight:  [110.7 kg] 110.7 kg (06/11 0500)  Filed Weights   10/16/18 0458 10/17/18 0535 10/18/18 0500  Weight: 105.7 kg 106.4 kg 110.7 kg    Weight change: 4.332 kg   Hemodynamic parameters for last 24 hours: PAP: (23-56)/(9-27) 35/11 CO:  [3.7 L/min-6.7 L/min] 4.5 L/min CI:  [1.6 L/min/m2-3 L/min/m2] 2 L/min/m2  Intake/Output from previous day: 06/10 0701 - 06/11 0700 In: 4485.5 [P.O.:480; I.V.:3212.7; IV Piggyback:792.9] Out: 2849 [Urine:2355; Chest DVVO:160]  Intake/Output this shift: No intake/output data recorded.  Current Meds: Scheduled Meds: . acetaminophen  1,000 mg Oral Q6H   Or  . acetaminophen (TYLENOL) oral liquid 160 mg/5 mL  1,000 mg Per Tube Q6H  . aspirin EC  325 mg Oral Daily   Or  . aspirin  324 mg Per Tube Daily  . atorvastatin  80 mg Oral Daily  . bisacodyl  10 mg Oral Daily   Or  . bisacodyl  10 mg Rectal Daily  . docusate sodium  200 mg Oral Daily  . ezetimibe  10 mg Oral Daily  . folic acid  1 mg Oral Daily  . insulin aspart  0-24 Units Subcutaneous Q4H  . metoprolol tartrate  12.5 mg Oral BID   Or  .  metoprolol tartrate  12.5 mg Per Tube BID  . [START ON 10/19/2018] pantoprazole  40 mg Oral Daily  . sodium chloride flush  3 mL Intravenous Q12H  . thiamine  100 mg Oral Daily   Or  . thiamine  100 mg Intravenous Daily   Continuous Infusions: . sodium chloride 20 mL/hr at 10/18/18 0700  . sodium chloride    . sodium chloride 20 mL (10/17/18 1345)  . albumin human 12.5 g (10/17/18 1800)  . cefUROXime (ZINACEF)  IV Stopped (10/18/18 0406)  . dexmedetomidine (PRECEDEX) IV infusion Stopped (10/17/18 1527)  . lactated ringers    . lactated ringers    . lactated ringers 20 mL/hr at 10/18/18 0700  . nitroGLYCERIN Stopped (10/18/18 0256)  . phenylephrine (NEO-SYNEPHRINE) Adult infusion Stopped (10/17/18 1407)   PRN Meds:.sodium chloride, albumin human, lactated ringers, metoprolol tartrate, midazolam, morphine injection, ondansetron (ZOFRAN) IV, oxyCODONE, sodium chloride flush, traMADol  General appearance: alert, cooperative and no distress Heart: regular rate and rhythm Lungs: clear ant/lat Abdomen: soft, nontender Extremities: no signif edema noted Wound: dressings CDI  Lab Results: CBC: Recent Labs    10/17/18 1945 10/17/18 1949 10/18/18 0338  WBC 14.3*  --  18.1*  HGB 11.3*  11.9* 11.9* 11.7*  HCT 34.3*  35.0* 35.0* 35.5*  PLT 162  --  172   BMET:  Recent Labs    10/17/18 0536  10/17/18 1945 10/17/18 1949 10/18/18 0338  NA 140   < > 137 139 136  K 4.1   < > 4.6 4.7 4.5  CL 108  --  106  --  107  CO2 24  --   --   --  21*  GLUCOSE 100*   < > 117*  --  123*  BUN 14  --  13  --  13  CREATININE 1.16  --  0.90  0.80  --  1.01  CALCIUM 9.4  --   --   --  8.3*   < > = values in this interval not displayed.    CMET: Lab Results  Component Value Date   WBC 18.1 (H) 10/18/2018   HGB 11.7 (L) 10/18/2018   HCT 35.5 (L) 10/18/2018   PLT 172 10/18/2018   GLUCOSE 123 (H) 10/18/2018   CHOL 118 10/15/2018   TRIG 60 10/15/2018   HDL 48 10/15/2018   LDLCALC 58  10/15/2018   ALT 40 10/15/2018   AST 28 10/15/2018   NA 136 10/18/2018   K 4.5 10/18/2018   CL 107 10/18/2018   CREATININE 1.01 10/18/2018   BUN 13 10/18/2018   CO2 21 (L) 10/18/2018   INR 1.4 (H) 10/17/2018   HGBA1C 5.8 (H) 10/16/2018      PT/INR:  Recent Labs    10/17/18 1323  LABPROT 16.6*  INR 1.4*   Radiology: Dg Chest Port 1 View  Result Date: 10/17/2018 CLINICAL DATA:  Status post CABG EXAM: PORTABLE CHEST 1 VIEW COMPARISON:  10/13/2018 chest radiograph. FINDINGS: Endotracheal tube tip is 3.2 cm above the carina. Intact sternotomy wires. Right internal jugular Swan-Ganz catheter terminates over main pulmonary artery. Mediastinal drain and left basilar chest tube are in place. Stable cardiomediastinal silhouette with top-normal heart size. No pneumothorax. No pleural effusion. Low lung volumes with vascular crowding and no overt pulmonary edema. Mild left basilar atelectasis. IMPRESSION: 1. Well-positioned support structures.  No pneumothorax. 2. Low lung volumes with mild left basilar atelectasis. Electronically Signed   By: Ilona Sorrel M.D.   On: 10/17/2018 13:56     Assessment/Plan: S/P Procedure(s) (LRB): CORONARY ARTERY BYPASS GRAFTING (CABG) TIMES THREE USING LEFT INTERNAL MAMMARY ARTERY AND GREATER SAPHENOUS VEIN COLLECTED ENDOSCOPICALLY (N/A) TRANSESOPHAGEAL ECHOCARDIOGRAM (TEE) (N/A)  1 doing well, POD#1 2 hemodyn stable off gtts, sinus rhythm 3 sats good on 4 l [place] 4 CXR - clear lung fields 5 leukocytosis, systemic inflam response , no fevers- monitor 6 minor ABL anemia- monitor 7 excellent uop- , wt up about 5 kg if accurate , cont spont diuresis for now, renal fxn normal 8 minor CT drainage- 180 cc today so far, d/c tubes 9 d/c lines  10 BS well controlled, no DM hx 11 routine progression orders 12 lovenox for DVT prophylaxis  John Giovanni PA-C 10/18/2018 7:23 AM  Pager [phone]  Patient seen and examined, agree with above Has a friction rub at  LLSB, ECG with some anterior St elevation- I suspect this is from epicardial/ pericardial inflammation. Nothing to suggest ischemia Dc A line, swan, chest tubes Mobilize  Diedra Sinor C. Roxan Hockey, MD [facility] [phone]

## 2018-10-19 ENCOUNTER — Inpatient Hospital Stay (HOSPITAL_COMMUNITY): Payer: BC Managed Care – PPO

## 2018-10-19 LAB — GLUCOSE, CAPILLARY
Glucose-Capillary: 100 mg/dL — ABNORMAL HIGH (ref 70–99)
Glucose-Capillary: 107 mg/dL — ABNORMAL HIGH (ref 70–99)
Glucose-Capillary: 69 mg/dL — ABNORMAL LOW (ref 70–99)
Glucose-Capillary: 95 mg/dL (ref 70–99)
Glucose-Capillary: 98 mg/dL (ref 70–99)

## 2018-10-19 LAB — BASIC METABOLIC PANEL
Anion gap: 7 (ref 5–15)
BUN: 19 mg/dL (ref 6–20)
CO2: 23 mmol/L (ref 22–32)
Calcium: 8.6 mg/dL — ABNORMAL LOW (ref 8.9–10.3)
Chloride: 103 mmol/L (ref 98–111)
Creatinine, Ser: 1.06 mg/dL (ref 0.61–1.24)
GFR calc Af Amer: >60 mL/min (ref >60)
GFR calc non Af Amer: >60 mL/min (ref >60)
Glucose, Bld: 98 mg/dL (ref 70–99)
Potassium: 4 mmol/L (ref 3.5–5.1)
Sodium: 133 mmol/L — ABNORMAL LOW (ref 135–145)

## 2018-10-19 LAB — CBC
HCT: 31.3 % — ABNORMAL LOW (ref 39.0–52.0)
Hemoglobin: 10.2 g/dL — ABNORMAL LOW (ref 13.0–17.0)
MCH: 31.2 pg (ref 26.0–34.0)
MCHC: 32.6 g/dL (ref 30.0–36.0)
MCV: 95.7 fL (ref 80.0–100.0)
Platelets: 154 10*3/uL (ref 150–400)
RBC: 3.27 MIL/uL — ABNORMAL LOW (ref 4.22–5.81)
RDW: 13.8 % (ref 11.5–15.5)
WBC: 14.4 10*3/uL — ABNORMAL HIGH (ref 4.0–10.5)
nRBC: 0 % (ref 0.0–0.2)

## 2018-10-19 MED ORDER — SODIUM CHLORIDE 0.9% FLUSH: 3.0000 mL | INTRAVENOUS | Status: DC | PRN

## 2018-10-19 MED ORDER — CHLORHEXIDINE GLUCONATE CLOTH 2 % EX PADS
6.0000 | MEDICATED_PAD | Freq: Every day | CUTANEOUS | Status: DC
  Administered 2018-10-19 – 2018-10-20 (×2): 6 via TOPICAL

## 2018-10-19 MED ORDER — MOVING RIGHT ALONG BOOK
Freq: Once | Status: AC
  Administered 2018-10-19: 1
  Filled 2018-10-19: qty 1

## 2018-10-19 MED ORDER — BISACODYL 5 MG PO TBEC
10.0000 mg | DELAYED_RELEASE_TABLET | Freq: Every day | ORAL | Status: DC | PRN
  Administered 2018-10-22: 10 mg via ORAL
  Filled 2018-10-19: qty 2

## 2018-10-19 MED ORDER — BISACODYL 10 MG RE SUPP: 10.0000 mg | Freq: Every day | RECTAL | Status: DC | PRN

## 2018-10-19 MED ORDER — SODIUM CHLORIDE 0.9 % IV SOLN: 250.0000 mL | INTRAVENOUS | Status: DC | PRN

## 2018-10-19 MED ORDER — FUROSEMIDE 40 MG PO TABS
40.0000 mg | ORAL_TABLET | Freq: Every day | ORAL | Status: DC
  Administered 2018-10-19 – 2018-10-22 (×4): 40 mg via ORAL
  Filled 2018-10-19 (×4): qty 1

## 2018-10-19 MED ORDER — POTASSIUM CHLORIDE CRYS ER 20 MEQ PO TBCR
20.0000 meq | EXTENDED_RELEASE_TABLET | Freq: Every day | ORAL | Status: DC
  Administered 2018-10-19 – 2018-10-22 (×4): 20 meq via ORAL
  Filled 2018-10-19 (×4): qty 1

## 2018-10-19 MED ORDER — METOPROLOL TARTRATE 12.5 MG HALF TABLET
12.5000 mg | ORAL_TABLET | Freq: Two times a day (BID) | ORAL | Status: DC
  Administered 2018-10-19 – 2018-10-20 (×4): 12.5 mg via ORAL
  Filled 2018-10-19 (×4): qty 1

## 2018-10-19 MED ORDER — ONDANSETRON HCL 4 MG PO TABS: 4.0000 mg | ORAL_TABLET | Freq: Four times a day (QID) | ORAL | Status: DC | PRN

## 2018-10-19 MED ORDER — PANTOPRAZOLE SODIUM 40 MG PO TBEC
40.0000 mg | DELAYED_RELEASE_TABLET | Freq: Every day | ORAL | Status: DC
  Administered 2018-10-19 – 2018-10-22 (×4): 40 mg via ORAL
  Filled 2018-10-19 (×4): qty 1

## 2018-10-19 MED ORDER — ACETAMINOPHEN 325 MG PO TABS: 650.0000 mg | ORAL_TABLET | Freq: Four times a day (QID) | ORAL | Status: DC | PRN

## 2018-10-19 MED ORDER — TRAMADOL HCL 50 MG PO TABS
50.0000 mg | ORAL_TABLET | ORAL | Status: DC | PRN
  Administered 2018-10-22: 100 mg via ORAL
  Filled 2018-10-19: qty 2

## 2018-10-19 MED ORDER — ONDANSETRON HCL 4 MG/2ML IJ SOLN: 4.0000 mg | Freq: Four times a day (QID) | INTRAMUSCULAR | Status: DC | PRN

## 2018-10-19 MED ORDER — OXYCODONE HCL 5 MG PO TABS: 5.0000 mg | ORAL_TABLET | ORAL | Status: DC | PRN

## 2018-10-19 MED ORDER — DOCUSATE SODIUM 100 MG PO CAPS
200.0000 mg | ORAL_CAPSULE | Freq: Every day | ORAL | Status: DC
  Administered 2018-10-19 – 2018-10-22 (×4): 200 mg via ORAL
  Filled 2018-10-19 (×4): qty 2

## 2018-10-19 MED ORDER — SODIUM CHLORIDE 0.9% FLUSH
3.0000 mL | Freq: Two times a day (BID) | INTRAVENOUS | Status: DC
  Administered 2018-10-19 – 2018-10-21 (×5): 3 mL via INTRAVENOUS

## 2018-10-19 MED ORDER — ALUM & MAG HYDROXIDE-SIMETH 200-200-20 MG/5ML PO SUSP: 15.0000 mL | ORAL | Status: DC | PRN

## 2018-10-19 MED ORDER — MAGNESIUM HYDROXIDE 400 MG/5ML PO SUSP
30.0000 mL | Freq: Every day | ORAL | Status: DC | PRN
  Administered 2018-10-20 – 2018-10-21 (×2): 30 mL via ORAL
  Filled 2018-10-19 (×2): qty 30

## 2018-10-19 NOTE — Progress Notes (Signed)
[facility] DAILY ICU PROGRESS NOTE                   [address]            [place] [address]          [phone]   2 Days Post-Op Procedure(s) (LRB): CORONARY ARTERY BYPASS GRAFTING (CABG) TIMES THREE USING LEFT INTERNAL MAMMARY ARTERY AND GREATER SAPHENOUS VEIN COLLECTED ENDOSCOPICALLY (N/A) TRANSESOPHAGEAL ECHOCARDIOGRAM (TEE) (N/A)  Total Length of Stay:  LOS: 4 days   Subjective: Looks and feels well, no specific c/o  Objective: Vital signs in last 24 hours: Temp:  [97.7 F (36.5 C)-98.3 F (36.8 C)] 98.3 F (36.8 C) (06/12 0300) Pulse Rate:  [78-108] 108 (06/12 0700) Cardiac Rhythm: Normal sinus rhythm (06/11 2000) Resp:  [18-44] 32 (06/12 0700) BP: (93-126)/(50-89) 108/67 (06/12 0700) SpO2:  [87 %-100 %] 91 % (06/12 0700) Arterial Line BP: (148-161)/(78-88) 161/88 (06/11 0806) Weight:  [989 kg] 111 kg (06/12 0500)  Filed Weights   10/17/18 0535 10/18/18 0500 10/19/18 0500  Weight: 106.4 kg 110.7 kg 111 kg    Weight change: 0.3 kg   Hemodynamic parameters for last 24 hours: PAP: (45-55)/(25-35) 55/35  Intake/Output from previous day: 06/11 0701 - 06/12 0700 In: 1614 [P.O.:1080; I.V.:334; IV Piggyback:200] Out: 220 [Urine:200; Chest Tube:20]  Intake/Output this shift: No intake/output data recorded.  Current Meds: Scheduled Meds: . acetaminophen  1,000 mg Oral Q6H   Or  . acetaminophen (TYLENOL) oral liquid 160 mg/5 mL  1,000 mg Per Tube Q6H  . alfuzosin  10 mg Oral Daily  . aspirin EC  325 mg Oral Daily   Or  . aspirin  324 mg Per Tube Daily  . atorvastatin  80 mg Oral Daily  . bisacodyl  10 mg Oral Daily   Or  . bisacodyl  10 mg Rectal Daily  . Chlorhexidine Gluconate Cloth  6 each Topical Daily  . cilostazol  50 mg Oral BID  . docusate sodium  200 mg Oral Daily  . enoxaparin (LOVENOX) injection  40 mg Subcutaneous QHS  . ezetimibe  10 mg Oral Daily  . folic acid  1 mg Oral Daily  . gabapentin  600 mg Oral TID  . insulin aspart   0-24 Units Subcutaneous Q4H  . insulin aspart  0-24 Units Subcutaneous Q4H  . metoprolol tartrate  12.5 mg Oral BID   Or  . metoprolol tartrate  12.5 mg Per Tube BID  . pantoprazole  40 mg Oral Daily  . sodium chloride flush  10-40 mL Intracatheter Q12H  . sodium chloride flush  3 mL Intravenous Q12H  . thiamine  100 mg Oral Daily   Or  . thiamine  100 mg Intravenous Daily  . tiZANidine  4 mg Oral Daily   Continuous Infusions: . sodium chloride Stopped (10/18/18 0845)  . sodium chloride    . sodium chloride 20 mL/hr at 10/18/18 2100  . dexmedetomidine (PRECEDEX) IV infusion Stopped (10/17/18 1527)  . lactated ringers    . lactated ringers    . lactated ringers 10 mL/hr at 10/19/18 0700  . nitroGLYCERIN Stopped (10/18/18 0256)  . phenylephrine (NEO-SYNEPHRINE) Adult infusion Stopped (10/17/18 1407)   PRN Meds:.sodium chloride, lactated ringers, metoprolol tartrate, midazolam, morphine injection, ondansetron (ZOFRAN) IV, oxyCODONE, sodium chloride flush, sodium chloride flush, SUMAtriptan, traMADol  General appearance: alert, cooperative and no distress Heart: regular rate and rhythm and faint rub Lungs: dim in bilat lower fields Abdomen: benign  Extremities: no signif edema Wound: dressings CDI  Lab Results: CBC: Recent Labs    10/18/18 1707 10/19/18 0400  WBC 15.8* 14.4*  HGB 11.0* 10.2*  HCT 33.8* 31.3*  PLT 170 154   BMET:  Recent Labs    10/18/18 1707 10/19/18 0400  NA 134* 133*  K 4.2 4.0  CL 106 103  CO2 21* 23  GLUCOSE 159* 98  BUN 18 19  CREATININE 1.15 1.06  CALCIUM 8.5* 8.6*    CMET: Lab Results  Component Value Date   WBC 14.4 (H) 10/19/2018   HGB 10.2 (L) 10/19/2018   HCT 31.3 (L) 10/19/2018   PLT 154 10/19/2018   GLUCOSE 98 10/19/2018   CHOL 118 10/15/2018   TRIG 60 10/15/2018   HDL 48 10/15/2018   LDLCALC 58 10/15/2018   ALT 40 10/15/2018   AST 28 10/15/2018   NA 133 (L) 10/19/2018   K 4.0 10/19/2018   CL 103 10/19/2018    CREATININE 1.06 10/19/2018   BUN 19 10/19/2018   CO2 23 10/19/2018   INR 1.4 (H) 10/17/2018   HGBA1C 5.8 (H) 10/16/2018      PT/INR:  Recent Labs    10/17/18 1323  LABPROT 16.6*  INR 1.4*   Radiology: Dg Chest Port 1 View  Result Date: 10/19/2018 CLINICAL DATA:  Chest pain following cardiac surgery EXAM: PORTABLE CHEST 1 VIEW COMPARISON:  10/18/2018 FINDINGS: Cardiac shadow is stable. Postoperative changes are seen. Right jugular vascular sheath is again noted. Mediastinal drain, left thoracostomy tube and Swan-Ganz catheter have been removed. Bibasilar atelectatic changes are noted. No definitive pneumothorax is seen. IMPRESSION: Mild bibasilar atelectasis. Electronically Signed   By: Inez Catalina M.D.   On: 10/19/2018 07:08     Assessment/Plan: S/P Procedure(s) (LRB): CORONARY ARTERY BYPASS GRAFTING (CABG) TIMES THREE USING LEFT INTERNAL MAMMARY ARTERY AND GREATER SAPHENOUS VEIN COLLECTED ENDOSCOPICALLY (N/A) TRANSESOPHAGEAL ECHOCARDIOGRAM (TEE) (N/A)   1 conts to make good progress POD#2 2 hemodyn stable in sinus rhythm 3 sats good on RA, has fair amount of atx on CXR, cont pulm toilet/ mobilize 4 renal fxn normal - only 500 cc uop recorded yesterday, not sure of accuracy of weight, will add a little lasix short term 5 H/H a little lower, monitor 6 leukocytosis trend improving, no fevers 7 d/c central line 8 routine rehab 9 tx to [place] PA-C 10/19/2018 7:20 AM  Pager [phone]

## 2018-10-19 NOTE — Progress Notes (Signed)
Hypoglycemic Event  CBG: 69  Treatment: Pt just received lunch, will recheck in 15 mins  Symptoms: asymptomatic  Follow-up CBG: Time:1210 CBG Result:98  Possible Reasons for Event:  Comments/MD notified:    Brisha Mccabe Cammie Mcgee

## 2018-10-19 NOTE — Progress Notes (Signed)
Patient ID: Larry Pham, male   DOB: [birth year], 53 y.o.   MRN: [id number] EVENING ROUNDS NOTE :     [address]       [place] [address]             [phone]                 2 Days Post-Op Procedure(s) (LRB): CORONARY ARTERY BYPASS GRAFTING (CABG) TIMES THREE USING LEFT INTERNAL MAMMARY ARTERY AND GREATER SAPHENOUS VEIN COLLECTED ENDOSCOPICALLY (N/A) TRANSESOPHAGEAL ECHOCARDIOGRAM (TEE) (N/A)  Total Length of Stay:  LOS: 4 days  BP (!) 139/114   Pulse (!) 108   Temp 97.9 F (36.6 C) (Oral)   Resp (!) 40   Ht 5\' 11"  (1.803 m)   Wt 111 kg   SpO2 96%   BMI 34.13 kg/m   .Intake/Output      06/11 0701 - 06/12 0700 06/12 0701 - 06/13 0700   P.O. 1080 480   I.V. (mL/kg) 334 (3) 27.5 (0.2)   IV Piggyback 200    Total Intake(mL/kg) 1614 (14.5) 507.5 (4.6)   Urine (mL/kg/hr) 200 (0.1) 900 (0.9)   Stool 0    Chest Tube 20    Total Output 220 900   Net +1394 -392.5        Urine Occurrence 0 x    Stool Occurrence 0 x      . sodium chloride       Lab Results  Component Value Date   WBC 14.4 (H) 10/19/2018   HGB 10.2 (L) 10/19/2018   HCT 31.3 (L) 10/19/2018   PLT 154 10/19/2018   GLUCOSE 98 10/19/2018   CHOL 118 10/15/2018   TRIG 60 10/15/2018   HDL 48 10/15/2018   LDLCALC 58 10/15/2018   ALT 40 10/15/2018   AST 28 10/15/2018   NA 133 (L) 10/19/2018   K 4.0 10/19/2018   CL 103 10/19/2018   CREATININE 1.06 10/19/2018   BUN 19 10/19/2018   CO2 23 10/19/2018   INR 1.4 (H) 10/17/2018   HGBA1C 5.8 (H) 10/16/2018   Stable day    [name] MD  Beeper [phone] Office [phone] 10/19/2018 4:20 PM

## 2018-10-19 NOTE — Discharge Summary (Addendum)
Physician Discharge Summary  Patient ID: Larry Pham MRN: [id number] DOB/AGE: 55-29-1967 55 y.o.  Admit date: 10/13/2018 Discharge date: 10/22/2018  Admission Diagnoses: Severe coronary artery disease  Discharge Diagnoses:  Principal Problem:   Chest pain Active Problems:   CAD (coronary artery disease), native coronary artery   Tobacco use   Essential hypertension   Hypotension   Unstable angina (HCC)   S/P CABG x 3  Patient Active Problem List   Diagnosis Date Noted  . S/P CABG x 3 10/17/2018  . Chest pain 10/14/2018  . Hypotension 10/14/2018  . Unstable angina ([facility]) 10/14/2018  . CKD (chronic kidney disease), stage II 02/18/2016  . ETOH abuse 02/18/2016  . Noncompliance 02/18/2016  . Chest pain on exertion 02/17/2016  . PAD (peripheral artery disease) ([facility]) 06/23/2015  . Coronary artery disease involving native coronary artery of native heart with angina pectoris ([facility])   . Diverticulosis of colon without hemorrhage   . History of colonic polyps   . Screening for colon cancer 03/09/2015  . CAD (coronary artery disease), native coronary artery 06/19/2012  . 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 Tobacco use 06/19/2012  . History of alcohol abuse 06/19/2012  . Hyperlipidemia with target LDL less than 70 06/19/2012  . Essential hypertension 06/19/2012   History of the present illness:  The patient is a 53 year old gentleman who has been evaluated for recent complaints of chest pain.  He has a history of previous myocardial infarction and right coronary artery stent as well as carotid disease, peripheral arterial disease, hypertension, hyperlipidemia, tobacco abuse, remote CVA and a history of ethanol abuse.  A drug-eluting stent was placed in his RCA in 2013 following an inferior STEMI.  He had a second stent placed also in the right coronary artery in 2014.  Recently the patient has noted exertional substernal chest pressure.  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  He has been using nitroglycerin as well on occasion.  On 10/14/2018 he developed substernal chest pressure and took nitroglycerin with only partial relief of pain.  He then came to the emergency room and ruled out for myocardial infarction.  Due to his previous history and the symptoms he subsequently was felt to require admission for further management to include repeat cardiac catheterization. Discharged Condition: good  Hospital Course: The patient was admitted for further evaluation and treatment and underwent cardiac catheterization which showed 65% distal left main disease as well as diffuse disease in the right coronary artery.  His stents were patent.  He was seen in cardiothoracic surgical consultation by Dr. Erasmo Leventhal who evaluated the patient and studies and recommended to proceed with coronary artery surgical revascularization.  On 10/17/2018 he was taken to the operating room where he underwent the below described procedure.  He tolerated it well and was taken to the surgical intensive care unit in stable condition  Postoperative hospital course:  The patient has done well.  He was extubated without difficulty using standard weaning protocols.  He has remained hemodynamically stable and normal sinus rhythm.  All routine lines, monitors and drainage devices have been discontinued in standard fashion.  He did have a mild postoperative volume overload but has responded well to diuretics.  Renal function is within normal limits.  He does have a mild expected acute blood loss anemia but has not required transfusion.  It is stable.  Oxygen has been weaned and he maintains good saturations on room air.  Incisions are noted to be healing well without evidence of infection.  He is tolerating diet and routine cardiac rehab modalities.  At the time of discharge the patient is felt to be quite stable. He had  atrial flutter with a rapid ventricular response that was treated with amiodarone and metoprolol. He converted to sinus rhythm in less than 24 hours.     Consults: cardiology  Significant Diagnostic Studies: angiography: Cardiac catheterization, routine postoperative serial chest x-rays and labs.  Treatments: surgery:  OPERATIVE REPORT  DATE OF PROCEDURE:  10/17/2018  PREOPERATIVE DIAGNOSIS:  Left main and 3-vessel coronary disease with unstable angina.  POSTOPERATIVE DIAGNOSIS:  Left main and 3-vessel coronary disease with unstable angina.  PROCEDURE:  Median sternotomy, extracorporeal circulation, coronary artery bypass grafting x3 (left internal mammary artery to left anterior descending, saphenous vein graft to obtuse marginal, saphenous vein graft to posterior descending).  SURGEON:  Modesto Charon, MD  ASSISTANT:  Jadene Pierini, PA-C   ANESTHESIA:  General.  FINDINGS:  Good quality targets, good quality conduits, preserved left ventricular function with no significant valvular pathology by transesophageal echocardiography. Discharge Exam: Blood pressure 123/74, pulse 86, temperature 97.6 F (36.4 C), temperature source Oral, resp. rate (!) 24, height 5\' 11"  (1.803 m), weight 109 kg, SpO2 95 %.  General appearance: alert, cooperative and no distress Neurologic: intact Heart: regular rate and rhythm Lungs: diminished breath sounds bibasilar Abdomen: normal findings: soft, non-tender Wound: clean and dry  Disposition: Discharge disposition: 01-Home or Self Care      Discharge Instructions    Amb Referral to Cardiac Rehabilitation   Complete by: As directed    Diagnosis: CABG   CABG X ___: 3   After initial evaluation and assessments completed: Virtual Based Care may be provided alone or in conjunction with Phase 2 Cardiac Rehab based on patient barriers.: Yes   Discharge patient   Complete by: As directed    Discharge disposition: 01-Home or Self Care    Discharge patient date: 10/22/2018     Allergies as of 10/22/2018      Reactions   Imdur [isosorbide Nitrate] Other (See Comments)   Made CP worse   Bee Venom Swelling   SWELLING REACTION UNSPECIFIED       Medication List    STOP taking these medications   lisinopril 40 MG tablet Commonly known as: ZESTRIL   metoprolol succinate 25 MG 24 hr tablet Commonly known as: TOPROL-XL   naproxen sodium 220 MG tablet Commonly known as: ALEVE   nitroGLYCERIN 0.4 MG SL tablet Commonly known as: NITROSTAT   ranitidine 150 MG tablet Commonly known as: ZANTAC   sucralfate 1 GM/10ML suspension Commonly known as: Carafate     TAKE these medications   albuterol 108 (90 Base) MCG/ACT inhaler Commonly known as: VENTOLIN HFA Inhale 2 puffs into the lungs every 6 (six) hours as needed for wheezing or shortness of breath.   alfuzosin 10 MG 24 hr tablet Commonly known as: UROXATRAL Take 10 mg by mouth daily.   amiodarone 400 MG tablet Commonly known as: PACERONE Take 1 tablet (400 mg total) by mouth 2 (two) times daily. For seven days, then once daily   aspirin 325 MG EC tablet Take 1 tablet (325 mg total) by mouth daily. Start taking on: October 23, 2018 What changed:   medication strength  how much to take   atorvastatin 80 MG tablet Commonly known as: LIPITOR  Take 1 tablet (80 mg total) by mouth daily.   cilostazol 50 MG tablet Commonly known as: PLETAL Take 1 tablet (50 mg total) by mouth 2 (two) times daily. What changed: Another medication with the same name was removed. Continue taking this medication, and follow the directions you see here.   ezetimibe 10 MG tablet Commonly known as: ZETIA Take 1 tablet (10 mg total) by mouth daily.   gabapentin 600 MG tablet Commonly known as: NEURONTIN Take 600 mg by mouth 3 (three) times daily.   hydrochlorothiazide 25 MG tablet Commonly known as: HYDRODIURIL Take 1 tablet (25 mg total) by mouth daily.   metoprolol  tartrate 25 MG tablet Commonly known as: LOPRESSOR Take 1 tablet (25 mg total) by mouth 2 (two) times daily.   oxyCODONE 5 MG immediate release tablet Commonly known as: Oxy IR/ROXICODONE Take 1-2 tablets (5-10 mg total) by mouth every 3 (three) hours as needed for up to 7 days for severe pain.   pantoprazole 20 MG tablet Commonly known as: PROTONIX Take 20 mg by mouth daily.   SUMAtriptan 50 MG tablet Commonly known as: IMITREX Take 1 tablet by mouth every 2 (two) hours as needed for migraine or headache.   tiZANidine 4 MG tablet Commonly known as: ZANAFLEX Take 4 mg by mouth daily.   Vitamin D (Ergocalciferol) 1.25 MG (50000 UT) Caps capsule Commonly known as: DRISDOL Take 1 capsule by mouth 2 (two) times a week.      Follow-up Information    Lorretta Harp, MD Follow up.   Specialties: Cardiology, Radiology Why: You have a video appointment with Bernerd Pho, PA-C on 07/04/2018 at 2 PM Contact information: [address] [place] [address] [phone]        Melrose Nakayama, MD Follow up.   Specialty: Cardiothoracic Surgery Why: Appointment to see Dr. Roxan Hockey on 11/20/2018 at 2:30 PM.  Please obtain a chest x-ray at [facility] 1/2-hour prior to appointment.  [facility] is located in the same office complex on the first floor. Contact information: [place] [address] [phone]          The patient has been discharged on:   1.Beta Blocker:  Yes Blue.Reese   ]                              No   [   ]                              If No, reason:  2.Ace Inhibitor/ARB: Yes [   ]                                     No  [  n  ]                                     If No, reason: well controlled BP  3.Statin:   Yes Blue.Reese   ]                  No  [   ]  If No, reason:  4.Ecasa:  Yes  [ y  ]                  No   [   ]                  If No, reason:  Signed: John Giovanni  PA-C 10/22/2018, 10:53 AM

## 2018-10-19 NOTE — Discharge Instructions (Signed)
Coronary Artery Bypass Grafting, Care After This sheet gives you information about how to care for yourself after your procedure. Your doctor may also give you more specific instructions. If you have problems or questions, contact your doctor. Follow these instructions at home: Medicines  Take over-the-counter and prescription medicines only as told by your doctor. Do not stop taking medicines or start any new medicines unless your doctor says it is okay.  If you were prescribed an antibiotic medicine, take it as told by your doctor. Do not stop taking the antibiotic even if you start to feel better.  Do not drive or use heavy machinery while taking prescription pain medicine. Incision care      Follow instructions from your doctor about how to take care of your incisions. Make sure you: ? Wash your hands with soap and water before you change your bandage (dressing). If you cannot use soap and water, use hand sanitizer. ? Change your dressing as told by your doctor. ? Leave stitches (sutures), skin glue, or skin tape (adhesive) strips in place. They may need to stay in place for 2 weeks or longer. If tape strips get loose and curl up, you may trim the loose edges. Do not remove tape strips completely unless your doctor says it is okay.  Make sure the incisions are clean, dry, and protected.  Check your incision areas every day for signs of infection. Check for: ? Redness, swelling, or pain. ? Fluid or blood. ? Warmth. ? Pus or a bad smell.  If cuts were made in your legs: ? Avoid crossing your legs. ? Avoid sitting for long periods of time. Change positions every 30 minutes. ? Raise (elevate) your legs when you are sitting. Bathing  Do not take baths, swim, or use a hot tub until your doctor says it is okay. You may shower-do not rub incisions pat dry gently with a towel Endoscopic Saphenous Vein Harvesting, Care After Refer to this sheet in the next few weeks. These instructions  provide you with information about caring for yourself after your procedure. Your health care provider may also give you more specific instructions. Your treatment has been planned according to current medical practices, but problems sometimes occur. Call your health care provider if you have any problems or questions after your procedure. What can I expect after the procedure? After the procedure, it is common to have: Pain. Bruising. Swelling. Numbness. Follow these instructions at home: Medicine Take over-the-counter and prescription medicines only as told by your health care provider. Do not drive or operate heavy machinery while taking prescription pain medicine. Incision care  Follow instructions from your health care provider about how to take care of the cut made during surgery (incision). Make sure you: Wash your hands with soap and water before you change your bandage (dressing). If soap and water are not available, use hand sanitizer. Change your dressing as told by your health care provider. Leave stitches (sutures), skin glue, or adhesive strips in place. These skin closures may need to be in place for 2 weeks or longer. If adhesive strip edges start to loosen and curl up, you may trim the loose edges. Do not remove adhesive strips completely unless your health care provider tells you to do that. Check your incision area every day for signs of infection. Check for: More redness, swelling, or pain. More fluid or blood. Warmth. Pus or a bad smell. General instructions Raise (elevate) your legs above the level of your heart  while you are sitting or lying down. Do any exercises your health care providers have given you. These may include deep breathing, coughing, and walking exercises. Do not shower, take baths, swim, or use a hot tub unless told by your health care provider. Wear your elastic stocking if told by your health care provider. Keep all follow-up visits as told by your  health care provider. This is important. Contact a health care provider if: Medicine does not help your pain. Your pain gets worse. You have new leg bruises or your leg bruises get bigger. You have a fever. Your leg feels numb. You have more redness, swelling, or pain around your incision. You have more fluid or blood coming from your incision. Your incision feels warm to the touch. You have pus or a bad smell coming from your incision. Get help right away if: Your pain is severe. You develop pain, tenderness, warmth, redness, or swelling in any part of your leg. You have chest pain. You have trouble breathing. This information is not intended to replace advice given to you by your health care provider. Make sure you discuss any questions you have with your health care provider. Document Released: 01/05/2011 Document Revised: 10/01/2015 Document Reviewed: 03/09/2015 Elsevier Interactive Patient Education  2019 [place] and drinking  Eat foods that are high in fiber, such as raw fruits and vegetables, whole grains, beans, and nuts. Meats should be lean cut. Avoid canned, processed, and fried foods. This can help prevent constipation. This is also a recommended part of a heart-healthy diet.  Drink enough fluid to keep your urine clear or pale yellow.  Limit alcohol intake to no more than 1 drink a day for nonpregnant women and 2 drinks a day for men. One drink equals 12 oz of beer, 5 oz of wine, or 1 oz of hard liquor. Activity  Rest and limit your activity as told by your doctor. You may be told to: ? Stop any activity right away if you have chest pain, shortness of breath, irregular heartbeats, or dizziness. Get help right away if you have any of these symptoms. ? Move around often for short periods or take short walks as told by your doctor. Slowly increase your activities. You may need physical therapy or cardiac rehabilitation. ? Avoid lifting, pushing, or pulling  anything that is heavier than 10 lb (4.5 kg) for at least 6 weeks or as told by your doctor.  Do not drive until your doctor says it is okay.  Ask your doctor when you can go back to work.  Ask your doctor when you can be sexually active. General instructions   Do not use any products that contain nicotine or tobacco, such as cigarettes and e-cigarettes. If you need help quitting, ask your doctor.  Take 2-3 deep breaths every few hours during the day while you get better. This helps expand your lungs and prevent complications.  If you were given a device called an incentive spirometer, use it several times a day to practice deep breathing. Support your chest with a pillow or your arms when you take deep breaths or cough.  Wear compression stockings as told by your doctor. These stockings help to prevent blood clots and reduce swelling in your legs.  Weigh yourself every day. This helps to see if your body is holding (retaining) fluid that may make your heart and lungs work harder.  Keep all follow-up visits as told by your doctor. This is  important. Contact a doctor if:  You have more redness, swelling, or pain around any cut.  You have more fluid or blood coming from any cut.  Any cut feels warm to the touch.  You have pus or a bad smell coming from any cut.  You have a fever.  You have swelling in your ankles or legs.  You have pain in your legs.  You gain 2 or more pounds (0.9 kg) a day.  You feel sick to your stomach (nauseous) or throw up (vomit).  You have watery poop (diarrhea). Get help right away if:  You have chest pain that goes to your jaw or arms.  You are short of breath.  You have a fast or irregular heartbeat.  You notice a "clicking" in your breastbone (sternum) when you move.  You feel numb or weak in your arms or legs.  You feel dizzy or light-headed. Summary  After the procedure, it is common to have pain or discomfort in the incision  areas.  Do not take baths, swim, or use a hot tub until your health care provider approves.  Slowly increase your activities. You may need physical therapy or cardiac rehabilitation.  Weigh yourself every day. This helps to see if your body is holding (retaining) fluid that may make your heart and lungs work harder. This information is not intended to replace advice given to you by your health care provider. Make sure you discuss any questions you have with your health care provider. Document Released: 04/30/2013 Document Revised: 06/07/2016 Document Reviewed: 06/07/2016 Elsevier Interactive Patient Education  2019 [employer].

## 2018-10-20 ENCOUNTER — Inpatient Hospital Stay (HOSPITAL_COMMUNITY): Payer: BC Managed Care – PPO

## 2018-10-20 LAB — CBC
HCT: 28.5 % — ABNORMAL LOW (ref 39.0–52.0)
Hemoglobin: 9.4 g/dL — ABNORMAL LOW (ref 13.0–17.0)
MCH: 30.9 pg (ref 26.0–34.0)
MCHC: 33 g/dL (ref 30.0–36.0)
MCV: 93.8 fL (ref 80.0–100.0)
Platelets: 169 10*3/uL (ref 150–400)
RBC: 3.04 MIL/uL — ABNORMAL LOW (ref 4.22–5.81)
RDW: 13.4 % (ref 11.5–15.5)
WBC: 10.6 10*3/uL — ABNORMAL HIGH (ref 4.0–10.5)
nRBC: 0 % (ref 0.0–0.2)

## 2018-10-20 LAB — BASIC METABOLIC PANEL
Anion gap: 8 (ref 5–15)
BUN: 14 mg/dL (ref 6–20)
CO2: 23 mmol/L (ref 22–32)
Calcium: 8.5 mg/dL — ABNORMAL LOW (ref 8.9–10.3)
Chloride: 103 mmol/L (ref 98–111)
Creatinine, Ser: 1.01 mg/dL (ref 0.61–1.24)
GFR calc Af Amer: >60 mL/min (ref >60)
GFR calc non Af Amer: >60 mL/min (ref >60)
Glucose, Bld: 97 mg/dL (ref 70–99)
Potassium: 3.9 mmol/L (ref 3.5–5.1)
Sodium: 134 mmol/L — ABNORMAL LOW (ref 135–145)

## 2018-10-20 MED ORDER — AMIODARONE HCL 200 MG PO TABS: 400.0000 mg | ORAL_TABLET | Freq: Every day | ORAL | Status: DC

## 2018-10-20 MED ORDER — AMIODARONE HCL 200 MG PO TABS
400.0000 mg | ORAL_TABLET | Freq: Two times a day (BID) | ORAL | Status: DC
  Administered 2018-10-20 – 2018-10-22 (×4): 400 mg via ORAL
  Filled 2018-10-20 (×4): qty 2

## 2018-10-20 MED ORDER — AMIODARONE IV BOLUS ONLY 150 MG/100ML
150.0000 mg | Freq: Once | INTRAVENOUS | Status: AC
  Administered 2018-10-20: 150 mg via INTRAVENOUS
  Filled 2018-10-20: qty 100

## 2018-10-20 NOTE — Progress Notes (Signed)
Patient's HR increased to 160-170's. Monitor showed SVT. EKG done showing atrial flutter (see chart for copy). VSS. Patient placed O2 2L for comfort. O2 sats 95-100%  Paged Dr. Servando Snare; verbal orders given: 150 mg bolus IV amio once; 400 mg amio PO BID for 7 days; 400 mg daily thereafter. Orders read back MD and carried out.   Patient HR in 90's-110's. Patient resting comfortably. Will continue to monitor

## 2018-10-20 NOTE — Progress Notes (Signed)
Patient ID: Larry Pham, male   DOB: 01-18-1966, 53 y.o.   MRN: [id number] [facility] DAILY ICU PROGRESS NOTE                   [place]            [place] [address]          [id number]   3 Days Post-Op Procedure(s) (LRB): CORONARY ARTERY BYPASS GRAFTING (CABG) TIMES THREE USING LEFT INTERNAL MAMMARY ARTERY AND GREATER SAPHENOUS VEIN COLLECTED ENDOSCOPICALLY (N/A) TRANSESOPHAGEAL ECHOCARDIOGRAM (TEE) (N/A)  Total Length of Stay:  LOS: 5 days   Subjective: Awake alert no specific complaints this morning, waiting for stepdown bed  Objective: Vital signs in last 24 hours: Temp:  [97.2 F (36.2 C)-99.7 F (37.6 C)] 97.6 F (36.4 C) (06/13 0438) Pulse Rate:  [84-117] 97 (06/13 0600) Cardiac Rhythm: Normal sinus rhythm (06/13 0000) Resp:  [13-40] 35 (06/13 0600) BP: (88-139)/(52-114) 135/70 (06/13 0600) SpO2:  [90 %-99 %] 99 % (06/13 0600) Weight:  [109.7 kg] 109.7 kg (06/13 0600)  Filed Weights   10/18/18 0500 10/19/18 0500 10/20/18 0600  Weight: 110.7 kg 111 kg 109.7 kg    Weight change: -1.3 kg   Hemodynamic parameters for last 24 hours:    Intake/Output from previous day: 06/12 0701 - 06/13 0700 In: 747.5 [P.O.:720; I.V.:27.5] Out: 1450 [Urine:1450]  Intake/Output this shift: No intake/output data recorded.  Current Meds: Scheduled Meds: . acetaminophen  1,000 mg Oral Q6H   Or  . acetaminophen (TYLENOL) oral liquid 160 mg/5 mL  1,000 mg Per Tube Q6H  . alfuzosin  10 mg Oral Daily  . aspirin EC  325 mg Oral Daily   Or  . aspirin  324 mg Per Tube Daily  . atorvastatin  80 mg Oral Daily  . Chlorhexidine Gluconate Cloth  6 each Topical Daily  . cilostazol  50 mg Oral BID  . docusate sodium  200 mg Oral Daily  . ezetimibe  10 mg Oral Daily  . folic acid  1 mg Oral Daily  . furosemide  40 mg Oral Daily  . gabapentin  600 mg Oral TID  . metoprolol tartrate  12.5 mg Oral BID  . pantoprazole  40 mg Oral QAC breakfast  . potassium chloride   20 mEq Oral Daily  . sodium chloride flush  3 mL Intravenous Q12H  . thiamine  100 mg Oral Daily   Or  . thiamine  100 mg Intravenous Daily  . tiZANidine  4 mg Oral Daily   Continuous Infusions: . sodium chloride     PRN Meds:.sodium chloride, acetaminophen, alum & mag hydroxide-simeth, bisacodyl **OR** bisacodyl, magnesium hydroxide, ondansetron **OR** ondansetron (ZOFRAN) IV, oxyCODONE, sodium chloride flush, SUMAtriptan, traMADol  General appearance: alert, cooperative and no distress Neurologic: intact Heart: regular rate and rhythm, S1, S2 normal, no murmur, click, rub or gallop Lungs: clear to auscultation bilaterally Abdomen: soft, non-tender; bowel sounds normal; no masses,  no organomegaly Extremities: extremities normal, atraumatic, no cyanosis or edema and Homans sign is negative, no sign of DVT Wound: Sternum stable dressing intact  Lab Results: CBC: Recent Labs    10/19/18 0400 10/20/18 0305  WBC 14.4* 10.6*  HGB 10.2* 9.4*  HCT 31.3* 28.5*  PLT 154 169   BMET:  Recent Labs    10/19/18 0400 10/20/18 0305  NA 133* 134*  K 4.0 3.9  CL 103 103  CO2 23 23  GLUCOSE 98 97  BUN 19  14  CREATININE 1.06 1.01  CALCIUM 8.6* 8.5*    CMET: Lab Results  Component Value Date   WBC 10.6 (H) 10/20/2018   HGB 9.4 (L) 10/20/2018   HCT 28.5 (L) 10/20/2018   PLT 169 10/20/2018   GLUCOSE 97 10/20/2018   CHOL 118 10/15/2018   TRIG 60 10/15/2018   HDL 48 10/15/2018   LDLCALC 58 10/15/2018   ALT 40 10/15/2018   AST 28 10/15/2018   NA 134 (L) 10/20/2018   K 3.9 10/20/2018   CL 103 10/20/2018   CREATININE 1.01 10/20/2018   BUN 14 10/20/2018   CO2 23 10/20/2018   INR 1.4 (H) 10/17/2018   HGBA1C 5.8 (H) 10/16/2018      PT/INR:  Recent Labs    10/17/18 1323  LABPROT 16.6*  INR 1.4*   Radiology: No results found.   Assessment/Plan: S/P Procedure(s) (LRB): CORONARY ARTERY BYPASS GRAFTING (CABG) TIMES THREE USING LEFT INTERNAL MAMMARY ARTERY AND GREATER  SAPHENOUS VEIN COLLECTED ENDOSCOPICALLY (N/A) TRANSESOPHAGEAL ECHOCARDIOGRAM (TEE) (N/A) Mobilize Diuresis d/c pacing wires Possible home in next 1 to 2 days  Waiting for stepdown bed     [name] 10/20/2018 7:38 AM

## 2018-10-21 MED ORDER — METOPROLOL TARTRATE 25 MG PO TABS
25.0000 mg | ORAL_TABLET | Freq: Two times a day (BID) | ORAL | Status: DC
  Administered 2018-10-21 – 2018-10-22 (×3): 25 mg via ORAL
  Filled 2018-10-21 (×3): qty 1

## 2018-10-21 NOTE — Progress Notes (Addendum)
      [address]       [place] [address]             [id number]      4 Days Post-Op Procedure(s) (LRB): CORONARY ARTERY BYPASS GRAFTING (CABG) TIMES THREE USING LEFT INTERNAL MAMMARY ARTERY AND GREATER SAPHENOUS VEIN COLLECTED ENDOSCOPICALLY (N/A) TRANSESOPHAGEAL ECHOCARDIOGRAM (TEE) (N/A) Subjective: A flutter last night. The patient shares that he was feeling short of breath at that time. He is feeling better.   Objective: Vital signs in last 24 hours: Temp:  [97.7 F (36.5 C)-98.2 F (36.8 C)] 97.7 F (36.5 C) (06/14 0719) Pulse Rate:  [88-103] 95 (06/14 0719) Cardiac Rhythm: Sinus tachycardia (06/13 2300) Resp:  [18-44] 25 (06/14 0719) BP: (116-139)/(71-92) 123/71 (06/14 0719) SpO2:  [93 %-100 %] 93 % (06/14 0719) Weight:  [109.2 kg] 109.2 kg (06/14 0534)     Intake/Output from previous day: 06/13 0701 - 06/14 0700 In: 930.7 [P.O.:840; I.V.:90.7] Out: 2475 [Urine:2475] Intake/Output this shift: No intake/output data recorded.  General appearance: alert, cooperative and no distress Heart: regular rate and rhythm, S1, S2 normal, no murmur, click, rub or gallop Lungs: clear to auscultation bilaterally Abdomen: soft, non-tender; bowel sounds normal; no masses,  no organomegaly Extremities: extremities normal, atraumatic, no cyanosis or edema Wound: clean and dry, dressed with gauze dressing  Lab Results: Recent Labs    10/19/18 0400 10/20/18 0305  WBC 14.4* 10.6*  HGB 10.2* 9.4*  HCT 31.3* 28.5*  PLT 154 169   BMET:  Recent Labs    10/19/18 0400 10/20/18 0305  NA 133* 134*  K 4.0 3.9  CL 103 103  CO2 23 23  GLUCOSE 98 97  BUN 19 14  CREATININE 1.06 1.01  CALCIUM 8.6* 8.5*    PT/INR: No results for input(s): LABPROT, INR in the last 72 hours. ABG    Component Value Date/Time   PHART 7.359 10/17/2018 1949   HCO3 23.6 10/17/2018 1949   TCO2 25 10/17/2018 1949   ACIDBASEDEF 2.0 10/17/2018 1949   O2SAT 99.0 10/17/2018 1949    CBG (last 3)  Recent Labs    10/19/18 0745 10/19/18 1140 10/19/18 1211  GLUCAP 107* 69* 98    Assessment/Plan: S/P Procedure(s) (LRB): CORONARY ARTERY BYPASS GRAFTING (CABG) TIMES THREE USING LEFT INTERNAL MAMMARY ARTERY AND GREATER SAPHENOUS VEIN COLLECTED ENDOSCOPICALLY (N/A) TRANSESOPHAGEAL ECHOCARDIOGRAM (TEE) (N/A)  1. CV- Atrial flutter last night with rate of 160-170s. Amio IV protocol initiated. Continue asa, statin, and metoprolol.  2. Pulm- CXR yesterday showed increased bilateral atelectasis and small pleural effusions.  3. Renal- creatinine 1.01, electrolytes okay. Weight appears below baseline. Continue Lasix 40mg  daily.  4. H and H 9.4/28.5, expected acute blood loss anemia 5. Endo-blood glucose has been well controlled  Plan: Watch rhythm today and if stable can remove EPW tomorrow. Continue PO Amio and increase metoprolol to 25mg  BID.    LOS: 6 days    Elgie Collard 10/21/2018  A fib/flutter last night, given iv amniodrone and started on po converted to sinus  Poss home 1-2 days I have seen and examined Larry Pham and agree with the above assessment  and plan.  [name] MD [facility] [phone] Office [phone] 10/21/2018 12:22 PM

## 2018-10-21 NOTE — Plan of Care (Signed)
  Problem: Clinical Measurements: Goal: Respiratory complications will improve Outcome: Progressing   Problem: Nutrition: Goal: Adequate nutrition will be maintained Outcome: Progressing   Problem: Elimination: Goal: Will not experience complications related to urinary retention Outcome: Progressing   Problem: Clinical Measurements: Goal: Cardiovascular complication will be avoided Outcome: Not Progressing

## 2018-10-22 MED ORDER — EZETIMIBE 10 MG PO TABS: 10.0000 mg | ORAL_TABLET | Freq: Every day | ORAL | 1 refills | Status: DC

## 2018-10-22 MED ORDER — AMIODARONE HCL 400 MG PO TABS: 400.0000 mg | ORAL_TABLET | Freq: Two times a day (BID) | ORAL | 1 refills | Status: DC

## 2018-10-22 MED ORDER — CILOSTAZOL 50 MG PO TABS: 50.0000 mg | ORAL_TABLET | Freq: Two times a day (BID) | ORAL | 1 refills | Status: DC

## 2018-10-22 MED ORDER — HYDROCHLOROTHIAZIDE 25 MG PO TABS: 25.0000 mg | ORAL_TABLET | Freq: Every day | ORAL | 1 refills | Status: AC

## 2018-10-22 MED ORDER — METOPROLOL TARTRATE 25 MG PO TABS: 25.0000 mg | ORAL_TABLET | Freq: Two times a day (BID) | ORAL | 1 refills | Status: DC

## 2018-10-22 MED ORDER — ASPIRIN 325 MG PO TBEC: 325.0000 mg | DELAYED_RELEASE_TABLET | Freq: Every day | ORAL | Status: DC

## 2018-10-22 MED ORDER — LACTULOSE 10 GM/15ML PO SOLN
20.0000 g | Freq: Once | ORAL | Status: AC
  Administered 2018-10-22: 20 g via ORAL
  Filled 2018-10-22: qty 30

## 2018-10-22 MED ORDER — ATORVASTATIN CALCIUM 80 MG PO TABS: 80.0000 mg | ORAL_TABLET | Freq: Every day | ORAL | 1 refills | Status: AC

## 2018-10-22 MED ORDER — OXYCODONE HCL 5 MG PO TABS: 5.0000 mg | ORAL_TABLET | ORAL | 0 refills | Status: AC | PRN

## 2018-10-22 MED FILL — Mannitol IV Soln 20%: INTRAVENOUS | Qty: 500 | Status: AC

## 2018-10-22 MED FILL — Sodium Chloride IV Soln 0.9%: INTRAVENOUS | Qty: 3000 | Status: AC

## 2018-10-22 MED FILL — Heparin Sodium (Porcine) Inj 1000 Unit/ML: INTRAMUSCULAR | Qty: 20 | Status: AC

## 2018-10-22 MED FILL — Lidocaine HCl Local Soln Prefilled Syringe 100 MG/5ML (2%): INTRAMUSCULAR | Qty: 5 | Status: AC

## 2018-10-22 MED FILL — Sodium Bicarbonate IV Soln 8.4%: INTRAVENOUS | Qty: 50 | Status: AC

## 2018-10-22 MED FILL — Electrolyte-R (PH 7.4) Solution: INTRAVENOUS | Qty: 4000 | Status: AC

## 2018-10-22 NOTE — Progress Notes (Signed)
Pt provided education and discharge instructions. Pt telebox removed/ccmd notified.  Vital stable. Pt denies any complaints. Pt has all belongings. Ride to meet pt @ [place] parking. Volunteers to tx via wheelchair.  Jerald Kief, RN

## 2018-10-22 NOTE — Progress Notes (Signed)
Chest tube sutures removed completely. Steri strips applied to both sites. Pt provided education for cleaning area. Tolerated well. Jerald Kief, RN

## 2018-10-22 NOTE — Progress Notes (Signed)
5 Days Post-Op Procedure(s) (LRB): CORONARY ARTERY BYPASS GRAFTING (CABG) TIMES THREE USING LEFT INTERNAL MAMMARY ARTERY AND GREATER SAPHENOUS VEIN COLLECTED ENDOSCOPICALLY (N/A) TRANSESOPHAGEAL ECHOCARDIOGRAM (TEE) (N/A) Subjective: C/o constipation  Objective: Vital signs in last 24 hours: Temp:  [97.6 F (36.4 C)-98.2 F (36.8 C)] 97.6 F (36.4 C) (06/15 0524) Pulse Rate:  [86-90] 86 (06/15 0524) Cardiac Rhythm: Normal sinus rhythm (06/14 1950) Resp:  [24-26] 24 (06/15 0524) BP: (106-124)/(74-76) 123/74 (06/15 0524) SpO2:  [93 %-95 %] 95 % (06/15 0524) Weight:  [045 kg] 109 kg (06/15 0524)  Hemodynamic parameters for last 24 hours:    Intake/Output from previous day: 06/14 0701 - 06/15 0700 In: 840 [P.O.:840] Out: 680 [Urine:680] Intake/Output this shift: No intake/output data recorded.  General appearance: alert, cooperative and no distress Neurologic: intact Heart: regular rate and rhythm Lungs: diminished breath sounds bibasilar Abdomen: normal findings: soft, non-tender Wound: clean and dry  Lab Results: Recent Labs    10/20/18 0305  WBC 10.6*  HGB 9.4*  HCT 28.5*  PLT 169   BMET:  Recent Labs    10/20/18 0305  NA 134*  K 3.9  CL 103  CO2 23  GLUCOSE 97  BUN 14  CREATININE 1.01  CALCIUM 8.5*    PT/INR: No results for input(s): LABPROT, INR in the last 72 hours. ABG    Component Value Date/Time   PHART 7.359 10/17/2018 1949   HCO3 23.6 10/17/2018 1949   TCO2 25 10/17/2018 1949   ACIDBASEDEF 2.0 10/17/2018 1949   O2SAT 99.0 10/17/2018 1949   CBG (last 3)  Recent Labs    10/19/18 1140 10/19/18 1211  GLUCAP 69* 98    Assessment/Plan: S/P Procedure(s) (LRB): CORONARY ARTERY BYPASS GRAFTING (CABG) TIMES THREE USING LEFT INTERNAL MAMMARY ARTERY AND GREATER SAPHENOUS VEIN COLLECTED ENDOSCOPICALLY (N/A) TRANSESOPHAGEAL ECHOCARDIOGRAM (TEE) (N/A) - Overall doing well   Maintaining SR on amiodarone and metoprolol Has not had a BM yet-  lactulose Possibly home later today   LOS: 7 days    Melrose Nakayama 10/22/2018

## 2018-10-22 NOTE — Progress Notes (Signed)
[id number] Reviewed exercise instructions, temperature precautions end points for exercise. Mr Foree is interested in virtual cardiac rehab. Obtained e mail address. Discussed heart healthy diet, sternal precautions, using IS at home and smoking cessation. Patient given handouts has open heart surgery booklet.Barnet Pall, RN,BSN 10/22/2018 9:18 AM

## 2018-10-22 NOTE — Progress Notes (Signed)
Pt ambulated x 470 feet around the unit pt tolerated well

## 2018-10-24 ENCOUNTER — Other Ambulatory Visit: Payer: Self-pay

## 2018-10-24 DIAGNOSIS — K219 Gastro-esophageal reflux disease without esophagitis: Secondary | ICD-10-CM

## 2018-10-24 DIAGNOSIS — G8918 Other acute postprocedural pain: Secondary | ICD-10-CM

## 2018-10-24 MED ORDER — GABAPENTIN 600 MG PO TABS: 600.0000 mg | ORAL_TABLET | Freq: Three times a day (TID) | ORAL | 1 refills | Status: DC

## 2018-10-24 MED ORDER — PANTOPRAZOLE SODIUM 20 MG PO TBEC: 20.0000 mg | DELAYED_RELEASE_TABLET | Freq: Every day | ORAL | 5 refills | Status: AC

## 2018-10-30 ENCOUNTER — Telehealth: Payer: Self-pay | Admitting: Student

## 2018-10-30 NOTE — Telephone Encounter (Signed)
Virtual Visit Pre-Appointment Phone Call  "(Name), I am calling you today to discuss your upcoming appointment. We are currently trying to limit exposure to the virus that causes COVID-19 by seeing patients at home rather than in the office."  1. "What is the BEST phone number to call the day of the visit?" - include this in appointment notes  2. Do you have or have access to (through a family member/friend) a smartphone with video capability that we can use for your visit?" a. If yes - list this number in appt notes as cell (if different from BEST phone #) and list the appointment type as a VIDEO visit in appointment notes b. If no - list the appointment type as a PHONE visit in appointment notes  3. Confirm consent - "In the setting of the current Covid19 crisis, you are scheduled for a (phone or video) visit with your provider on (date) at (time).  Just as we do with many in-office visits, in order for you to participate in this visit, we must obtain consent.  If you'd like, I can send this to your mychart (if signed up) or email for you to review.  Otherwise, I can obtain your verbal consent now.  All virtual visits are billed to your insurance company just like a normal visit would be.  By agreeing to a virtual visit, we'd like you to understand that the technology does not allow for your provider to perform an examination, and thus may limit your provider's ability to fully assess your condition. If your provider identifies any concerns that need to be evaluated in person, we will make arrangements to do so.  Finally, though the technology is pretty good, we cannot assure that it will always work on either your or our end, and in the setting of a video visit, we may have to convert it to a phone-only visit.  In either situation, we cannot ensure that we have a secure connection.  Are you willing to proceed?" STAFF: Did the patient verbally acknowledge consent to telehealth visit? Document  YES/NO here: Yes  4. Advise patient to be prepared - "Two hours prior to your appointment, go ahead and check your blood pressure, pulse, oxygen saturation, and your weight (if you have the equipment to check those) and write them all down. When your visit starts, your provider will ask you for this information. If you have an Apple Watch or Kardia device, please plan to have heart rate information ready on the day of your appointment. Please have a pen and paper handy nearby the day of the visit as well."  5. Give patient instructions for MyChart download to smartphone OR [email or web address] as below if video visit (depending on what platform provider is using)  6. Inform patient they will receive a phone call 15 minutes prior to their appointment time (may be from unknown caller ID) so they should be prepared to answer    TELEPHONE CALL NOTE  Rc Amison Maiorana has been deemed a candidate for a follow-up tele-health visit to limit community exposure during the Covid-19 pandemic. I spoke with the patient via phone to ensure availability of phone/video source, confirm preferred email & phone number, and discuss instructions and expectations.  I reminded Loraine Freid Pinch to be prepared with any vital sign and/or heart rhythm information that could potentially be obtained via home monitoring, at the time of his visit. I reminded Danta Baumgardner Krawczyk to expect a phone call prior to  his visit.  Bertram Gala Goins 10/30/2018 1:41 PM

## 2018-11-02 ENCOUNTER — Encounter: Payer: Self-pay | Admitting: Student

## 2018-11-02 ENCOUNTER — Other Ambulatory Visit: Payer: Self-pay

## 2018-11-02 ENCOUNTER — Telehealth (INDEPENDENT_AMBULATORY_CARE_PROVIDER_SITE_OTHER): Payer: BC Managed Care – PPO | Admitting: Student

## 2018-11-02 DIAGNOSIS — Z79899 Other long term (current) drug therapy: Secondary | ICD-10-CM

## 2018-11-02 DIAGNOSIS — E785 Hyperlipidemia, unspecified: Secondary | ICD-10-CM

## 2018-11-02 DIAGNOSIS — N182 Chronic kidney disease, stage 2 (mild): Secondary | ICD-10-CM

## 2018-11-02 DIAGNOSIS — I739 Peripheral vascular disease, unspecified: Secondary | ICD-10-CM

## 2018-11-02 DIAGNOSIS — I10 Essential (primary) hypertension: Secondary | ICD-10-CM

## 2018-11-02 DIAGNOSIS — Z8679 Personal history of other diseases of the circulatory system: Secondary | ICD-10-CM

## 2018-11-02 DIAGNOSIS — I2510 Atherosclerotic heart disease of native coronary artery without angina pectoris: Secondary | ICD-10-CM | POA: Diagnosis not present

## 2018-11-02 DIAGNOSIS — Z951 Presence of aortocoronary bypass graft: Secondary | ICD-10-CM

## 2018-11-02 DIAGNOSIS — Z720 Tobacco use: Secondary | ICD-10-CM

## 2018-11-02 MED ORDER — AMIODARONE HCL 200 MG PO TABS: 200.0000 mg | ORAL_TABLET | Freq: Every day | ORAL | 3 refills | Status: DC

## 2018-11-02 NOTE — Progress Notes (Signed)
Virtual Visit via Telephone Note   This visit type was conducted due to national recommendations for restrictions regarding the COVID-19 Pandemic (e.g. social distancing) in an effort to limit this patient's exposure and mitigate transmission in our community.  Due to his co-morbid illnesses, this patient is at least at moderate risk for complications without adequate follow up.  This format is felt to be most appropriate for this patient at this time.  The patient did not have access to video technology/had technical difficulties with video requiring transitioning to audio format only (telephone).  All issues noted in this document were discussed and addressed.  No physical exam could be performed with this format.  Please refer to the patient's chart for his  consent to telehealth for [facility].   Date:  11/02/2018   ID:  Larry Pham, DOB 12-26-1965, Larry Pham  Patient Location: Home Provider Location: Office  PCP:  Barry Dienes, NP  Cardiologist:  Carlyle Dolly, MD  PV: Dr. Fletcher Anon  Evaluation Performed:  Follow-Up Visit  Chief Complaint: Hospital Follow-up  History of Present Illness:    Larry Pham is a 53 y.o. male with past medical history of CAD (s/p STEMI in 2013 with DES to RCA, DES to RCA in 2014, medical management recommended by cath in 2016), PVD (s/p prior left SFA angioplasty and stent placement), HTN, HLD, alcohol abuse, tobacco use, and Stage 3 CKD who presents for a telehealth follow-up visit.   He recently presented to [facility] ED on 10/13/2018 for evaluation of worsening chest discomfort for the past 2 weeks which is occurring with exertion and improving with rest.  His initial and cyclic troponin values were negative but he did have some progression of T wave changes on his EKG and given his concerning symptoms, a cardiac catheterization was recommended for definitive evaluation. This showed patent stents along the RCA but was noted to have progression  of his LM disease, now at 60%. CT Surgery evaluation was recommended for consideration of CABG. Was evaluated by Dr. Roxan Hockey and underwent CABG on 10/17/2018 with LIMA-LAD, SVG-OM, and SVG-PDA. He overall done well in the postoperative setting. He did have postoperative volume overload but this improved with administration of IV diuretics. Was noted to have an episode of atrial flutter with RVR during admission which was treated with Amiodarone and Metoprolol with conversion back to normal sinus rhythm in less than 24 hours. He was discharged on Amiodarone 400mg  BID for 7 days then 400mg  daily along with ASA 325mg  daily, Atorvastatin 80mg  daily, Pletal 50mg  BID, HCTZ 25mg  daily, and Lopressor 25mg  BID.  In talking with the patient today, he reports overall progressing well since discharge. Denies any exertional chest pain. Having episodes of sternal pain with coughing but says this continues to improve. No drainage or reported erythema along his surgical site.  No recent dyspnea on exertion, orthopnea, PND, or lower extremity edema. He does not have scales at home but feels like this has been stable. He does have a BP cuff but has not checked it recently.   Has been walking for 5 minute intervals several times per day without any symptoms.   The patient does not have symptoms concerning for COVID-19 infection (fever, chills, cough, or new shortness of breath).    Past Medical History:  Diagnosis Date  . CAD (coronary artery disease)    a. 2013 Inf STEMI s/p RCA DES. b. DES to RCA 2014 at [facility]. c. 04/2015 Cath: LM 40, LAD  40ost, 13m, D1 30, LCX 81m, OM1 50, RCA 30p, 20/54m, 50d. EF 55-65%. d. s/p CABG on 10/17/2018 with LIMA-LAD, SVG-OM, and SVG-PDA.  . Carotid arterial disease ([place])    a. 07/2015 Carotid U/S: <50% bilat.  . Carpal tunnel syndrome   . CKD (chronic kidney disease), stage III ([place])   . CVA (cerebral vascular accident) ([place]) 12/09/2014   MRI brain=> remote infarcts seen in pons and  left thalamus   . GERD (gastroesophageal reflux disease)   . Hepatic steatosis   . History of ETOH abuse   . Hyperlipidemia   . Hypertension   . MI (myocardial infarction) ([place]) 02-2012  . PAD (peripheral artery disease) ([place])    a. 06/2015 s/p L SFA stenting; b. 12/2016 PV Angio: R SFA 40-45m, L SFA patent stent-->Med Rx, cillostazol added.  . Seasonal allergies    Past Surgical History:  Procedure Laterality Date  . ABDOMINAL AORTOGRAM W/LOWER EXTREMITY N/A 01/04/2017   Procedure: ABDOMINAL AORTOGRAM W/LOWER EXTREMITY;  Surgeon: Wellington Hampshire, MD;  Location: [place] [facility];  Service: Cardiovascular;  Laterality: N/A;  . CARDIAC CATHETERIZATION N/A 04/22/2015   Procedure: Left Heart Cath and Coronary Angiography;  Surgeon: Burnell Blanks, MD;  Location: [place] [facility];  Service: Cardiovascular;  Laterality: N/A;  . CARDIAC CATHETERIZATION N/A 04/22/2015   Procedure: Intravascular Pressure Wire/FFR Study;  Surgeon: Burnell Blanks, MD;  Location: [place] [facility];  Service: Cardiovascular;  Laterality: N/A;  . COLONOSCOPY WITH PROPOFOL N/A 03/23/2015   Procedure: COLONOSCOPY WITH PROPOFOL;  Surgeon: Daneil Dolin, MD;  Location: [facility];  Service: Endoscopy;  Laterality: N/A;  [place] time in 0849  time out  0959  total time 10 mintues  . CORONARY ANGIOPLASTY    . CORONARY ARTERY BYPASS GRAFT N/A 10/17/2018   Procedure: CORONARY ARTERY BYPASS GRAFTING (CABG) TIMES THREE USING LEFT INTERNAL MAMMARY ARTERY AND GREATER SAPHENOUS VEIN COLLECTED ENDOSCOPICALLY;  Surgeon: Melrose Nakayama, MD;  Location: [place];  Service: Open Heart Surgery;  Laterality: N/A;  . CORONARY ARTERY CATHETERIZATION WITH STENTING N/A 02-2102 AND 05-2012  . LEFT FOREARM SURGERY     s/p machine accident  . LEFT HEART CATH AND CORONARY ANGIOGRAPHY N/A 10/15/2018   Procedure: LEFT HEART CATH AND CORONARY ANGIOGRAPHY;  Surgeon: Lorretta Harp, MD;  Location: [place] [facility];  Service:  Cardiovascular;  Laterality: N/A;  . LOWER EXTREMITY ANGIOGRAM Bilateral 07/01/2015   Procedure: Lower Extremity Angiogram;  Surgeon: Wellington Hampshire, MD;  Location: [place] [facility];  Service: Cardiovascular;  Laterality: Bilateral;  . PERIPHERAL VASCULAR CATHETERIZATION N/A 07/01/2015   Procedure: Abdominal Aortogram;  Surgeon: Wellington Hampshire, MD;  Location: [place] [facility];  Service: Cardiovascular;  Laterality: N/A;  . PERIPHERAL VASCULAR CATHETERIZATION Left 07/01/2015   Procedure: Peripheral Vascular Intervention;  Surgeon: Wellington Hampshire, MD;  Location: [place] [facility];  Service: Cardiovascular;  Laterality: Left;  SFA  . POLYPECTOMY N/A 03/23/2015   Procedure: POLYPECTOMY;  Surgeon: Daneil Dolin, MD;  Location: [facility];  Service: Endoscopy;  Laterality: N/A;  cecal, descending colon  . TEE WITHOUT CARDIOVERSION N/A 10/17/2018   Procedure: TRANSESOPHAGEAL ECHOCARDIOGRAM (TEE);  Surgeon: Melrose Nakayama, MD;  Location: [place];  Service: Open Heart Surgery;  Laterality: N/A;  . TRIGGER FINGER RELEASE     right 5th digit     Current Meds  Medication Sig  . albuterol (PROVENTIL HFA;VENTOLIN HFA) 108 (90 BASE) MCG/ACT inhaler Inhale 2 puffs into  the lungs every 6 (six) hours as needed for wheezing or shortness of breath.  . alfuzosin (UROXATRAL) 10 MG 24 hr tablet Take 10 mg by mouth daily.   Marland Kitchen aspirin EC 325 MG EC tablet Take 1 tablet (325 mg total) by mouth daily.  Marland Kitchen atorvastatin (LIPITOR) 80 MG tablet Take 1 tablet (80 mg total) by mouth daily.  . cilostazol (PLETAL) 50 MG tablet Take 1 tablet (50 mg total) by mouth 2 (two) times daily.  Marland Kitchen ezetimibe (ZETIA) 10 MG tablet Take 1 tablet (10 mg total) by mouth daily.  Marland Kitchen gabapentin (NEURONTIN) 600 MG tablet Take 1 tablet (600 mg total) by mouth 3 (three) times daily.  . hydrochlorothiazide (HYDRODIURIL) 25 MG tablet Take 1 tablet (25 mg total) by mouth daily.  . metoprolol tartrate (LOPRESSOR) 25 MG tablet Take 1 tablet (25 mg  total) by mouth 2 (two) times daily.  . pantoprazole (PROTONIX) 20 MG tablet Take 1 tablet (20 mg total) by mouth daily.  . SUMAtriptan (IMITREX) 50 MG tablet Take 1 tablet by mouth every 2 (two) hours as needed for migraine or headache.   Marland Kitchen tiZANidine (ZANAFLEX) 4 MG tablet Take 4 mg by mouth daily.  . Vitamin D, Ergocalciferol, (DRISDOL) 50000 UNITS CAPS capsule Take 1 capsule by mouth 2 (two) times a week.  . [DISCONTINUED] amiodarone (PACERONE) 400 MG tablet Take 1 tablet (400 mg total) by mouth 2 (two) times daily. For seven days, then once daily     Allergies:   Imdur [isosorbide nitrate] and Bee venom   Social History   Tobacco Use  . Smoking status: Current Every Day Smoker    Packs/day: 1.00    Years: 30.00    Pack years: 30.00    Types: Cigarettes    Start date: 11/22/1977  . Smokeless tobacco: Never Used  Substance Use Topics  . Alcohol use: Yes    Alcohol/week: 0.0 standard drinks    Comment: 6-7 beers per day   . Drug use: No     Family Hx: The patient's family history includes Breast cancer in his paternal grandmother; Cirrhosis in his father; Heart disease in his mother; Hypertension in his mother. There is no history of Colon cancer.  ROS:   Please see the history of present illness.     All other systems reviewed and are negative.   Prior CV studies:   The following studies were reviewed today:  Echocardiogram: 10/14/2018 IMPRESSIONS   1. The left ventricle has hyperdynamic systolic function, with an ejection fraction of >65%. The cavity size was normal. Left ventricular diastolic parameters were normal.  2. The right ventricle has normal systolic function. The cavity was normal. There is no increase in right ventricular wall thickness.  3. Left atrial size was moderately dilated.  4. Right atrial size was mildly dilated.  5. Moderate thickening of the mitral valve leaflet.  6. The aortic valve is tricuspid.   Cardiac Catheterization: 10/15/2018   Prox RCA to Mid RCA lesion is 25% stenosed.  Mid RCA-1 lesion is 50% stenosed.  Mid RCA-2 lesion is 25% stenosed.  Dist RCA lesion is 40% stenosed.  Ost LM to Mid LM lesion is 60% stenosed.  IMPRESSION: Mr. Berthold has angiographic progression of his distal left main stenosis now in the 60+ percent range.  His RCA stents are widely patent with mild intimal hyperplasia though this does not appear obstructive.  He gives a classic description of new onset exertional chest pain which is fairly reproducible  and he is inferolateral T wave inversion.  His LVEDP was elevated.  I believe he will require CABG.  The sheath was removed and a TR band was placed on the right wrist to achieve patent hemostasis.  The patient did receive hydralazine for hypertension.  He left the lab in stable condition.  Dr. Meda Coffee was made aware of these results.  T CTS was notified.   Labs/Other Tests and Data Reviewed:    EKG:  An ECG dated 10/20/2018 was personally reviewed today and demonstrated:  atrial flutter with RVR, HR 145, with PVC's.   Recent Labs: 10/15/2018: ALT 40 10/18/2018: Magnesium 2.5 10/20/2018: BUN 14; Creatinine, Ser 1.01; Hemoglobin 9.4; Platelets 169; Potassium 3.9; Sodium 134   Recent Lipid Panel Lab Results  Component Value Date/Time   CHOL 118 10/15/2018 04:02 AM   TRIG 60 10/15/2018 04:02 AM   HDL 48 10/15/2018 04:02 AM   CHOLHDL 2.5 10/15/2018 04:02 AM   LDLCALC 58 10/15/2018 04:02 AM    Wt Readings from Last 3 Encounters:  10/22/18 240 lb 3.2 oz (109 kg)  07/17/18 239 lb (108.4 kg)  03/26/18 239 lb (108.4 kg)     Objective:    Vital Signs:  There were no vitals taken for this visit.   General: Pleasant male sounding in NAD Psych: Normal affect. Neuro: Alert and oriented X 3. Lungs:  Resp regular and unlabored while talking on the phone.    ASSESSMENT & PLAN:    1. CAD - he is s/p STEMI in 2013 with DES to RCA, DES to RCA in 2014 and medical management recommended by cath in  2016. Was admitted earlier this month for unstable angina and was found to have progression of his LM disease, ultimately requiring CABG on 10/17/2018 with LIMA-LAD, SVG-OM, and SVG-PDA.  - he has overall progressed well since surgery. Denies any exertional chest pain or dyspnea. Sternal pain continues to improve.  - continue ASA 325mg  daily, Atorvastatin 80mg  daily, HCTZ 25mg  daily, and Lopressor 25mg  BID. Will check BNP and CBC given post-op volume overload and anemia. Referral to Cardiac Rehab has been entered. He has Internet access and is interested in the virtual program currently being offered.   2. Post-Operative Atrial Flutter - he did experience post-op atrial flutter with conversion back to NSR. Denies any recurrent palpitations. Will reduce Amiodarone from 400mg  daily to 200mg  daily. Suspect this can be discontinued at his next visit if not discontinued by CT Surgery in the interim.    3. PVD - s/p prior left SFA angioplasty and stent placement which is followed by Dr. Fletcher Anon. Denies any recent claudication. Remains on ASA, Pletal, and statin therapy.   4. HTN - he has a BP cuff but has not followed this regularly. Was encouraged to check this a few times per week and record his readings.  - continue current regimen at this time with HCTZ 25mg  daily and Lopressor 25mg  BID.   5. HLD - FLP during recent admission showed total cholesterol of 118, HDL 48, Triglycerides 60, and LDL 58. Continue Atorvastatin 80mg  daily.   6. Stage 2 CKD - creatinine stable at 1.01 on 10/20/2018. Will recheck BMET with repeat labs.   7. Tobacco Use - was smoking approximately 1 ppd since admission. Has been trying to reduce his use. Congratulated on this with cessation advised.   8. COVID-19 Education - The signs and symptoms of COVID-19 were discussed with the patient. The importance of social distancing was discussed today.  Time:   Today, I have spent 23 minutes with the patient with telehealth  technology discussing the above problems.     Medication Adjustments/Labs and Tests Ordered: Current medicines are reviewed at length with the patient today.  Concerns regarding medicines are outlined above.   Tests Ordered: Orders Placed This Encounter  Procedures  . CBC with Differential/Platelet  . B Nat Peptide  . Basic Metabolic Panel (BMET)    Medication Changes: Meds ordered this encounter  Medications  . amiodarone (PACERONE) 200 MG tablet    Sig: Take 1 tablet (200 mg total) by mouth daily.    Dispense:  90 tablet    Refill:  3    Follow Up: Keep scheduled in-office visit with Dr. [name] in 12/2018  Signed, Erma Heritage, PA-C  11/02/2018 4:52 PM    [place]

## 2018-11-02 NOTE — Patient Instructions (Addendum)
Medication Instructions:  Your physician has recommended you make the following change in your medication: Decrease Amiodarone to 200 mg Daily    Labwork: Your physician recommends that you return for lab work in: (CBC, BMET, BNP)   Testing/Procedures: NONE  Follow-Up: Your physician recommends that you schedule a follow-up appointment in: August with Dr. [name]   Any Other Special Instructions Will Be Listed Below (If Applicable).     If you need a refill on your cardiac medications before your next appointment, please call your pharmacy. Thank you for choosing [facility]!     Two Gram Sodium Diet 2000 mg  What is Sodium? Sodium is a mineral found naturally in many foods. The most significant source of sodium in the diet is table salt, which is about 40% sodium.  Processed, convenience, and preserved foods also contain a large amount of sodium.  The body needs only 500 mg of sodium daily to function,  A normal diet provides more than enough sodium even if you do not use salt.  Why Limit Sodium? A build up of sodium in the body can cause thirst, increased blood pressure, shortness of breath, and water retention.  Decreasing sodium in the diet can reduce edema and risk of heart attack or stroke associated with high blood pressure.  Keep in mind that there are many other factors involved in these health problems.  Heredity, obesity, lack of exercise, cigarette smoking, stress and what you eat all play a role.  General Guidelines:  Do not add salt at the table or in cooking.  One teaspoon of salt contains over 2 grams of sodium.  Read food labels  Avoid processed and convenience foods  Ask your dietitian before eating any foods not dicussed in the menu planning guidelines  Consult your physician if you wish to use a salt substitute or a sodium containing medication such as antacids.  Limit milk and milk products to 16 oz (2 cups) per day.  Shopping  Hints:  READ LABELS!! "Dietetic" does not necessarily mean low sodium.  Salt and other sodium ingredients are often added to foods during processing.   Menu Planning Guidelines Food Group Choose More Often Avoid  Beverages (see also the milk group All fruit juices, low-sodium, salt-free vegetables juices, low-sodium carbonated beverages Regular vegetable or tomato juices, commercially softened water used for drinking or cooking  Breads and Cereals Enriched white, wheat, rye and pumpernickel bread, hard rolls and dinner rolls; muffins, cornbread and waffles; most dry cereals, cooked cereal without added salt; unsalted crackers and breadsticks; low sodium or homemade bread crumbs Bread, rolls and crackers with salted tops; quick breads; instant hot cereals; pancakes; commercial bread stuffing; self-rising flower and biscuit mixes; regular bread crumbs or cracker crumbs  Desserts and Sweets Desserts and sweets mad with mild should be within allowance Instant pudding mixes and cake mixes  Fats Butter or margarine; vegetable oils; unsalted salad dressings, regular salad dressings limited to 1 Tbs; light, sour and heavy cream Regular salad dressings containing bacon fat, bacon bits, and salt pork; snack dips made with instant soup mixes or processed cheese; salted nuts  Fruits Most fresh, frozen and canned fruits Fruits processed with salt or sodium-containing ingredient (some dried fruits are processed with sodium sulfites        Vegetables Fresh, frozen vegetables and low- sodium canned vegetables Regular canned vegetables, sauerkraut, pickled vegetables, and others prepared in brine; frozen vegetables in sauces; vegetables seasoned with ham, bacon or salt pork  Condiments, Sauces, Miscellaneous  Salt substitute with physician's approval; pepper, herbs, spices; vinegar, lemon or lime juice; hot pepper sauce; garlic powder, onion powder, low sodium soy sauce (1 Tbs.); low sodium condiments (ketchup,  chili sauce, mustard) in limited amounts (1 tsp.) fresh ground horseradish; unsalted tortilla chips, pretzels, potato chips, popcorn, salsa (1/4 cup) Any seasoning made with salt including garlic salt, celery salt, onion salt, and seasoned salt; sea salt, rock salt, kosher salt; meat tenderizers; monosodium glutamate; mustard, regular soy sauce, barbecue, sauce, chili sauce, teriyaki sauce, steak sauce, Worcestershire sauce, and most flavored vinegars; canned gravy and mixes; regular condiments; salted snack foods, olives, picles, relish, horseradish sauce, catsup   Food preparation: Try these seasonings Meats:    Pork Sage, onion Serve with applesauce  Chicken Poultry seasoning, thyme, parsley Serve with cranberry sauce  Lamb Curry powder, rosemary, garlic, thyme Serve with mint sauce or jelly  Veal Marjoram, basil Serve with current jelly, cranberry sauce  Beef Pepper, bay leaf Serve with dry mustard, unsalted chive butter  Fish [place] leaf, dill Serve with unsalted lemon butter, unsalted parsley butter  Vegetables:    Asparagus Lemon juice   Broccoli Lemon juice   Carrots Mustard dressing parsley, mint, nutmeg, glazed with unsalted butter and sugar   Green beans Marjoram, lemon juice, nutmeg,dill seed   Tomatoes Basil, marjoram, onion   Spice /blend for [employer]" 4 tsp ground thyme 1 tsp ground sage 3 tsp ground rosemary 4 tsp ground marjoram

## 2018-11-15 ENCOUNTER — Other Ambulatory Visit: Payer: Self-pay | Admitting: Thoracic Surgery (Cardiothoracic Vascular Surgery)

## 2018-11-15 DIAGNOSIS — Z951 Presence of aortocoronary bypass graft: Secondary | ICD-10-CM

## 2018-11-20 ENCOUNTER — Encounter: Payer: Self-pay | Admitting: Thoracic Surgery (Cardiothoracic Vascular Surgery)

## 2018-11-20 ENCOUNTER — Other Ambulatory Visit: Payer: Self-pay

## 2018-11-20 ENCOUNTER — Ambulatory Visit
Admission: RE | Admit: 2018-11-20 | Discharge: 2018-11-20 | Disposition: A | Discharge status: Home / Self Care | Payer: BC Managed Care – PPO | Source: Ambulatory Visit | Attending: Thoracic Surgery (Cardiothoracic Vascular Surgery) | Admitting: Thoracic Surgery (Cardiothoracic Vascular Surgery)

## 2018-11-20 ENCOUNTER — Ambulatory Visit (INDEPENDENT_AMBULATORY_CARE_PROVIDER_SITE_OTHER): Payer: Self-pay | Admitting: Thoracic Surgery (Cardiothoracic Vascular Surgery)

## 2018-11-20 VITALS — BP 93/60 | HR 62 | Temp 97.0°F | Resp 18 | Ht 71.0 in | Wt 243.0 lb

## 2018-11-20 DIAGNOSIS — Z951 Presence of aortocoronary bypass graft: Secondary | ICD-10-CM

## 2018-11-20 NOTE — Progress Notes (Signed)
[address]       [place] [address]             [phone]     HPI: [name] returns for scheduled follow-up visit  Ephram Kornegay is a 53 year old man with a past medical history significant for hypertension, hyperlipidemia, coronary artery disease, MI, RCA stent, carotid disease, peripheral arterial disease, remote CVA, and ethanol abuse.  He had an inferior STEMI in 2013 and had a stent placed in his right coronary.  He had a second stent placed in 2014.  He presented with exertional chest pressure.  He ruled out for MI.  At catheterization he was found to have left main disease.  He underwent coronary bypass grafting x3 on 10/17/2018.  His postoperative course was uncomplicated and he went home on day 5.  Since discharge he has not been smoking.  He has gained some weight.  He has been walking on a regular basis.  He has not had any recurrent angina.  He had a virtual visit with cardiology and his amiodarone dose was decreased to 200 mg daily  Past Medical History:  Diagnosis Date  . CAD (coronary artery disease)    a. 2013 Inf STEMI s/p RCA DES. b. DES to RCA 2014 at [facility]. c. 04/2015 Cath: LM 40, LAD 40ost, 39m, D1 30, LCX 79m, OM1 50, RCA 30p, 20/19m, 50d. EF 55-65%. d. s/p CABG on 10/17/2018 with LIMA-LAD, SVG-OM, and SVG-PDA.  . Carotid arterial disease ([place])    a. 07/2015 Carotid U/S: <50% bilat.  . Carpal tunnel syndrome   . CKD (chronic kidney disease), stage III ([place])   . CVA (cerebral vascular accident) ([place]) 12/09/2014   MRI brain=> remote infarcts seen in pons and left thalamus   . GERD (gastroesophageal reflux disease)   . Hepatic steatosis   . History of ETOH abuse   . Hyperlipidemia   . Hypertension   . MI (myocardial infarction) ([place]) 02-2012  . PAD (peripheral artery disease) ([place])    a. 06/2015 s/p L SFA stenting; b. 12/2016 PV Angio: R SFA 40-51m, L SFA patent stent-->Med Rx, cillostazol added.  . Seasonal allergies     Current  Outpatient Medications  Medication Sig Dispense Refill  . albuterol (PROVENTIL HFA;VENTOLIN HFA) 108 (90 BASE) MCG/ACT inhaler Inhale 2 puffs into the lungs every 6 (six) hours as needed for wheezing or shortness of breath. 1 Inhaler 2  . alfuzosin (UROXATRAL) 10 MG 24 hr tablet Take 10 mg by mouth daily.   11  . amiodarone (PACERONE) 200 MG tablet Take 1 tablet (200 mg total) by mouth daily. 90 tablet 3  . aspirin EC 325 MG EC tablet Take 1 tablet (325 mg total) by mouth daily.    Marland Kitchen atorvastatin (LIPITOR) 80 MG tablet Take 1 tablet (80 mg total) by mouth daily. 30 tablet 1  . cilostazol (PLETAL) 50 MG tablet Take 1 tablet (50 mg total) by mouth 2 (two) times daily. 60 tablet 1  . ezetimibe (ZETIA) 10 MG tablet Take 1 tablet (10 mg total) by mouth daily. 30 tablet 1  . gabapentin (NEURONTIN) 600 MG tablet Take 1 tablet (600 mg total) by mouth 3 (three) times daily. 90 tablet 1  . hydrochlorothiazide (HYDRODIURIL) 25 MG tablet Take 1 tablet (25 mg total) by mouth daily. 30 tablet 1  . metoprolol tartrate (LOPRESSOR) 25 MG tablet Take 1 tablet (25 mg total) by mouth 2 (two) times daily. 60 tablet 1  .  pantoprazole (PROTONIX) 20 MG tablet Take 1 tablet (20 mg total) by mouth daily. 30 tablet 5  . SUMAtriptan (IMITREX) 50 MG tablet Take 1 tablet by mouth every 2 (two) hours as needed for migraine or headache.   0  . tiZANidine (ZANAFLEX) 4 MG tablet Take 4 mg by mouth daily.    . Vitamin D, Ergocalciferol, (DRISDOL) 50000 UNITS CAPS capsule Take 1 capsule by mouth 2 (two) times a week.  5   No current facility-administered medications for this visit.     Physical Exam BP 93/60 (BP Location: Right Arm, Patient Position: Sitting, Cuff Size: Large)   Pulse 62   Temp (!) 97 F (36.1 C)   Resp 18   Ht 5\' 11"  (1.803 m)   Wt 243 lb (110.2 kg)   SpO2 97% Comment: RA  BMI 33.35 kg/m  53 year old man in no acute distress Alert and oriented x3 with no focal deficits Lungs clear with equal breath  sounds bilaterally Cardiac regular rate and rhythm normal S1 and S2 Sternum stable, incision healing well Leg incision healing well, no peripheral edema  Diagnostic Tests: CHEST - 2 VIEW  COMPARISON:  October 20, 2018  FINDINGS: The heart size and mediastinal contours are stable. There is no focal infiltrate, pulmonary edema, or pleural effusion. The visualized skeletal structures are unremarkable.  IMPRESSION: No active cardiopulmonary disease.   Electronically Signed   By: Abelardo Diesel M.D.   On: 11/20/2018 14:22 I personally reviewed the chest x-ray images and concur with the findings noted above  Impression: Larry Pham is a 53 year old man with a past medical history significant for hypertension, hyperlipidemia, coronary artery disease, MI, RCA stent, carotid disease, peripheral arterial disease, remote CVA, and ethanol abuse.  He presented with exertional angina and was found to have left main disease.  He underwent coronary artery bypass grafting x3.  We used the left mammary and saphenous vein grafts.  He did not have any stenoses tight enough to warrant right mammary or left radial graft.  CAD-status post CABG.  No recurrent angina.  Postoperative atrial flutter-in sinus rhythm today.  On amiodarone 200 mg daily.  Will defer to cardiology as to when to stop that medication.  He has an appointment with Dr. Fletcher Anon in August.  I encouraged him to gradually increase his walking.  He should not lift anything over 10 pounds before 1 August.  After that there were no restrictions on his activities.  He may drive.  Appropriate precautions were discussed.  Plan:  Follow-up with Dr. Fletcher Anon I will be happy to see Mr. Kirwan back again at any time if I can be of any further assistance with his care  Melrose Nakayama, MD [facility] [phone]

## 2018-12-13 ENCOUNTER — Other Ambulatory Visit: Payer: Self-pay | Admitting: Surgical

## 2018-12-18 ENCOUNTER — Telehealth: Payer: Self-pay | Admitting: Cardiology

## 2018-12-18 NOTE — Telephone Encounter (Signed)
Virtual Visit Pre-Appointment Phone Call  "(Name), I am calling you today to discuss your upcoming appointment. We are currently trying to limit exposure to the virus that causes COVID-19 by seeing patients at home rather than in the office."  1. "What is the BEST phone number to call the day of the visit?" - include this in appointment notes  2. Do you have or have access to (through a family member/friend) a smartphone with video capability that we can use for your visit?" a. If yes - list this number in appt notes as cell (if different from BEST phone #) and list the appointment type as a VIDEO visit in appointment notes b. If no - list the appointment type as a PHONE visit in appointment notes  3. Confirm consent - "In the setting of the current Covid19 crisis, you are scheduled for a (phone or video) visit with your provider on (date) at (time).  Just as we do with many in-office visits, in order for you to participate in this visit, we must obtain consent.  If you'd like, I can send this to your mychart (if signed up) or email for you to review.  Otherwise, I can obtain your verbal consent now.  All virtual visits are billed to your insurance company just like a normal visit would be.  By agreeing to a virtual visit, we'd like you to understand that the technology does not allow for your provider to perform an examination, and thus may limit your provider's ability to fully assess your condition. If your provider identifies any concerns that need to be evaluated in person, we will make arrangements to do so.  Finally, though the technology is pretty good, we cannot assure that it will always work on either your or our end, and in the setting of a video visit, we may have to convert it to a phone-only visit.  In either situation, we cannot ensure that we have a secure connection.  Are you willing to proceed?" STAFF: Did the patient verbally acknowledge consent to telehealth visit? Document  YES/NO here: yes  4. Advise patient to be prepared - "Two hours prior to your appointment, go ahead and check your blood pressure, pulse, oxygen saturation, and your weight (if you have the equipment to check those) and write them all down. When your visit starts, your provider will ask you for this information. If you have an Apple Watch or Kardia device, please plan to have heart rate information ready on the day of your appointment. Please have a pen and paper handy nearby the day of the visit as well."  5. Give patient instructions for MyChart download to smartphone OR [email or web address] as below if video visit (depending on what platform provider is using)  6. Inform patient they will receive a phone call 15 minutes prior to their appointment time (may be from unknown caller ID) so they should be prepared to answer    TELEPHONE CALL NOTE  Larry Pham has been deemed a candidate for a follow-up tele-health visit to limit community exposure during the Covid-19 pandemic. I spoke with the patient via phone to ensure availability of phone/video source, confirm preferred email & phone number, and discuss instructions and expectations.  I reminded Larry Pham to be prepared with any vital sign and/or heart rhythm information that could potentially be obtained via home monitoring, at the time of his visit. I reminded Larry Pham to expect a phone call prior to  his visit.  Larry Pham 12/18/2018 10:27 AM

## 2018-12-19 ENCOUNTER — Other Ambulatory Visit: Payer: Self-pay | Admitting: Surgical

## 2018-12-20 ENCOUNTER — Other Ambulatory Visit: Payer: Self-pay

## 2018-12-20 ENCOUNTER — Telehealth (INDEPENDENT_AMBULATORY_CARE_PROVIDER_SITE_OTHER): Payer: BC Managed Care – PPO | Admitting: Cardiology

## 2018-12-20 ENCOUNTER — Encounter: Payer: Self-pay | Admitting: Cardiology

## 2018-12-20 VITALS — BP 132/85 | HR 78 | Ht 71.5 in | Wt 247.0 lb

## 2018-12-20 DIAGNOSIS — I10 Essential (primary) hypertension: Secondary | ICD-10-CM

## 2018-12-20 DIAGNOSIS — I2510 Atherosclerotic heart disease of native coronary artery without angina pectoris: Secondary | ICD-10-CM

## 2018-12-20 DIAGNOSIS — E782 Mixed hyperlipidemia: Secondary | ICD-10-CM

## 2018-12-20 DIAGNOSIS — I739 Peripheral vascular disease, unspecified: Secondary | ICD-10-CM

## 2018-12-20 DIAGNOSIS — Z8679 Personal history of other diseases of the circulatory system: Secondary | ICD-10-CM

## 2018-12-20 NOTE — Progress Notes (Signed)
Virtual Visit via Telephone Note   This visit type was conducted due to national recommendations for restrictions regarding the COVID-19 Pandemic (e.g. social distancing) in an effort to limit this patient's exposure and mitigate transmission in our community.  Due to his co-morbid illnesses, this patient is at least at moderate risk for complications without adequate follow up.  This format is felt to be most appropriate for this patient at this time.  The patient did not have access to video technology/had technical difficulties with video requiring transitioning to audio format only (telephone).  All issues noted in this document were discussed and addressed.  No physical exam could be performed with this format.  Please refer to the patient's chart for his  consent to telehealth for [facility].   Date:  12/20/2018   ID:  Larry Pham, DOB Mar 22, 1966, MRN [id number]  Patient Location: Home Provider Location: Home  PCP:  Barry Dienes, NP  Cardiologist:  Carlyle Dolly, MD  Electrophysiologist:  None   Evaluation Performed:  Follow-Up Visit  Chief Complaint:  Follow up  History of Present Illness:    Larry Pham is a 53 y.o. male seen today for follow up of the following medical problems.  1. CAD  - prior PCI to RCA in [place], [place] in setting of inferior STEMI. Noted distal LM disease of 50% at that time.  - continued to have symptoms, seen at [facility]. [facility] MRI showed small RCA infarct with no current ischemic defects.  - 02/2012 echo 50-55% - Jan 2014 cath at [facility] with PCI to RCA with DES, left main disease described as 40% - Jan 2015 MPI without ischemia - 12/2014 MPI without clear ischemia.Small inferoseptal defect with mild reversibility, attenuation artifact vs small area of ischemia 04/2015 cath with moderate LM disease, patent RCA stents, mod LAD disease, severe stneosis small diastal LCX too small for PCI.  - imdur in the past reportedly made chest pain  symptoms worst.    underwent CABG on 10/17/2018 with LIMA-LAD, SVG-OM, and SVG-PDA. Presented with unstable angina.  - 10/2018 echo LVEF LVEF >65%  - no SOB or DOE. No recent chest pain    2. Postop aflutter - occurred after recent CABG, started on amio - no recent palpitations.   2. Hyperlipidemia  - compliant with high dose statin and zetia.   10/2018 TC 118 TG 60 HDL 48 LDL 58   3. HTN   compliant with meds  5. PAD - followed by Dr Fletcher Anon - history of left SFA stenting in 06/2015 - angiography 12/2016 showed moderate nonobstructive right SFA disease and patent left SFA stent - has been on cilostazol  - still some pain in right calf at about 75 yards.  - was to have repeat ABI and [place] in 07/2018 per Dr Fletcher Anon   The patient does not have symptoms concerning for COVID-19 infection (fever, chills, cough, or new shortness of breath).    Past Medical History:  Diagnosis Date  . CAD (coronary artery disease)    a. 2013 Inf STEMI s/p RCA DES. b. DES to RCA 2014 at [facility]. c. 04/2015 Cath: LM 40, LAD 40ost, 66m, D1 30, LCX 7m, OM1 50, RCA 30p, 20/30m, 50d. EF 55-65%. d. s/p CABG on 10/17/2018 with LIMA-LAD, SVG-OM, and SVG-PDA.  . Carotid arterial disease ([place])    a. 07/2015 Carotid U/S: <50% bilat.  . Carpal tunnel syndrome   . CKD (chronic kidney disease), stage III ([place])   . CVA (cerebral  vascular accident) ([place]) 12/09/2014   MRI brain=> remote infarcts seen in pons and left thalamus   . GERD (gastroesophageal reflux disease)   . Hepatic steatosis   . History of ETOH abuse   . Hyperlipidemia   . Hypertension   . MI (myocardial infarction) ([place]) 02-2012  . PAD (peripheral artery disease) ([place])    a. 06/2015 s/p L SFA stenting; b. 12/2016 PV Angio: R SFA 40-22m, L SFA patent stent-->Med Rx, cillostazol added.  . Seasonal allergies    Past Surgical History:  Procedure Laterality Date  . ABDOMINAL AORTOGRAM W/LOWER EXTREMITY N/A 01/04/2017   Procedure: ABDOMINAL  AORTOGRAM W/LOWER EXTREMITY;  Surgeon: Wellington Hampshire, MD;  Location: [facility];  Service: Cardiovascular;  Laterality: N/A;  . CARDIAC CATHETERIZATION N/A 04/22/2015   Procedure: Left Heart Cath and Coronary Angiography;  Surgeon: Burnell Blanks, MD;  Location: [place] [facility];  Service: Cardiovascular;  Laterality: N/A;  . CARDIAC CATHETERIZATION N/A 04/22/2015   Procedure: Intravascular Pressure Wire/FFR Study;  Surgeon: Burnell Blanks, MD;  Location: [place] [facility];  Service: Cardiovascular;  Laterality: N/A;  . COLONOSCOPY WITH PROPOFOL N/A 03/23/2015   Procedure: COLONOSCOPY WITH PROPOFOL;  Surgeon: Daneil Dolin, MD;  Location: [facility];  Service: Endoscopy;  Laterality: N/A;  [place] time in 0849  time out  0959  total time 10 mintues  . CORONARY ANGIOPLASTY    . CORONARY ARTERY BYPASS GRAFT N/A 10/17/2018   Procedure: CORONARY ARTERY BYPASS GRAFTING (CABG) TIMES THREE USING LEFT INTERNAL MAMMARY ARTERY AND GREATER SAPHENOUS VEIN COLLECTED ENDOSCOPICALLY;  Surgeon: Melrose Nakayama, MD;  Location: [facility];  Service: Open Heart Surgery;  Laterality: N/A;  . CORONARY ARTERY CATHETERIZATION WITH STENTING N/A 02-2102 AND 05-2012  . LEFT FOREARM SURGERY     s/p machine accident  . LEFT HEART CATH AND CORONARY ANGIOGRAPHY N/A 10/15/2018   Procedure: LEFT HEART CATH AND CORONARY ANGIOGRAPHY;  Surgeon: Lorretta Harp, MD;  Location: [place] [facility];  Service: Cardiovascular;  Laterality: N/A;  . LOWER EXTREMITY ANGIOGRAM Bilateral 07/01/2015   Procedure: Lower Extremity Angiogram;  Surgeon: Wellington Hampshire, MD;  Location: [facility];  Service: Cardiovascular;  Laterality: Bilateral;  . PERIPHERAL VASCULAR CATHETERIZATION N/A 07/01/2015   Procedure: Abdominal Aortogram;  Surgeon: Wellington Hampshire, MD;  Location: [facility];  Service: Cardiovascular;  Laterality: N/A;  . PERIPHERAL VASCULAR CATHETERIZATION Left 07/01/2015   Procedure: Peripheral  Vascular Intervention;  Surgeon: Wellington Hampshire, MD;  Location: [facility];  Service: Cardiovascular;  Laterality: Left;  SFA  . POLYPECTOMY N/A 03/23/2015   Procedure: POLYPECTOMY;  Surgeon: Daneil Dolin, MD;  Location: [facility];  Service: Endoscopy;  Laterality: N/A;  cecal, descending colon  . TEE WITHOUT CARDIOVERSION N/A 10/17/2018   Procedure: TRANSESOPHAGEAL ECHOCARDIOGRAM (TEE);  Surgeon: Melrose Nakayama, MD;  Location: [facility];  Service: Open Heart Surgery;  Laterality: N/A;  . TRIGGER FINGER RELEASE     right 5th digit     No outpatient medications have been marked as taking for the 12/20/18 encounter (Appointment) with Arnoldo Lenis, MD.     Allergies:   Imdur [isosorbide nitrate] and Bee venom   Social History   Tobacco Use  . Smoking status: Former Smoker    Packs/day: 1.00    Years: 30.00    Pack years: 30.00    Types: Cigarettes    Start date: 11/22/1977    Quit date: 10/30/2018  Years since quitting: 0.1  . Smokeless tobacco: Never Used  Substance Use Topics  . Alcohol use: Yes    Alcohol/week: 0.0 standard drinks    Comment: 6-7 beers per day   . Drug use: No     Family Hx: The patient's family history includes Breast cancer in his paternal grandmother; Cirrhosis in his father; Heart disease in his mother; Hypertension in his mother. There is no history of Colon cancer.  ROS:   Please see the history of present illness.     All other systems reviewed and are negative.   Prior CV studies:   The following studies were reviewed today:  05/2013 MPI Analysis of the raw data showed no increased extracardiac radiotracer uptake. Analysis of the perfusion images showed a small mild fixed mid inferior wall defect. Left ventricular cavity size was normal. Regional wall motion was normal, indicating that the defect was due to soft tissue attenuation artifact. Left ventricular systolic function was calculated to be mildly reduced, EF 42%.  However, systolic function appeared to be normal with an approximated EF of 50%.  IMPRESSION: 1. Normal myocardial perfusion imaging. No evidence of ischemia or scar.  2. Left ventricular systolic function was calculated to be mildly reduced, EF 42%. However, systolic function appeared to be grossly normal with an approximated EF of 50%.   05/2012 Cath at [facility] Cardiac Catheterization Results: Coronary arteries: Left main: 40% distal Left anterior descending: 30% ostial, scattered irregs Left circumflex: 40% mid, scattered irregs Right coronary: widely patent proximal stent, 70% mid, 40% at angle, diffuse irregs FFR of left main into LAD: 0.81  PCI Percutaneous coronary intervention performed of the mid RCA lesion which was stented with a 3.0 mm x 39mm Promus DES Results: 70% to normal  INTERVENTIONAL IMPRESSIONS and RECOMMENDATIONS: Successful PCI of the mid RCA lesion Aspirin 81 mg daily indefinitely Ticagrelor (Brilinta) 90 mg PO BID for at least 12 months Risk factor modification, smoking cessation  12/2014 Lexiscan MPI  This is a low risk study.  The left ventricular ejection fraction is mildly decreased (45-54%).  Small mild inferoseptal defect with mild reversibility. Differences in attenuation verse small area of ischemia, overall low risk finding.    04/2015 Carotid US IMPRESSION: No hemodynamically significant stenosis or plaque is noted in either cervical carotid artery.  04/2015 Cath  Ost 1st Mrg to 1st Mrg lesion, 50% stenosed.  1. Moderate left main stenosis, not flow limiting by FFR (FFR of 0.93) 2. Patent stents mid RCA with minimal restenosis.  3. Moderate distal RCA stenosis.  4. Moderate ostial LAD stenosis.  5. Severe stenosis small caliber distal AV groove Circumflex beyond the takeoff of the large OM Ezri Fanguy. (too small for PCI) 6. Normal LV systolic function  Recommendations: He has moderate disease as described above  with severe disease in the small caliber distal Circumflex. This vessel is too small for PCI. Continue medical management.    04/2015 Echo Study Conclusions  - Left ventricle: The cavity size was normal. Wall thickness was increased in a pattern of mild LVH. Systolic function was normal. The estimated ejection fraction was in the range of 50% to 55%. Features are consistent with a pseudonormal left ventricular filling pattern, with concomitant abnormal relaxation and increased filling pressure (grade 2 diastolic dysfunction). Doppler parameters are consistent with high ventricular filling pressure. - Regional wall motion abnormality: Mild hypokinesis of the mid inferoseptal, mid inferior, and mid inferolateral myocardium. - Aortic valve: Mildly calcified annulus. Trileaflet. -  Mitral valve: Calcified annulus.  12/2016 LE aniography 1. No significant aortoiliac disease. 2. Right lower extremity: Mild diffuse SFA disease with focal moderate 40-50% stenosis in the midsegment. Three-vessel runoff below the knee. 3. Left lower extremity: Mild diffuse SFA disease with patent stent in the midsegment with mild to moderate in-stent restenosis in three-vessel runoff below the knee.  Recommendations: I don't see a clear culprit for right calf claudication. Recommend continuing medical therapy and smoking cessation. Cilostazol can be considered.   Echocardiogram: 10/14/2018 IMPRESSIONS  1. The left ventricle has hyperdynamic systolic function, with an ejection fraction of >65%. The cavity size was normal. Left ventricular diastolic parameters were normal. 2. The right ventricle has normal systolic function. The cavity was normal. There is no increase in right ventricular wall thickness. 3. Left atrial size was moderately dilated. 4. Right atrial size was mildly dilated. 5. Moderate thickening of the mitral valve leaflet. 6. The aortic valve is tricuspid.    Cardiac Catheterization: 10/15/2018  Prox RCA to Mid RCA lesion is 25% stenosed.  Mid RCA-1 lesion is 50% stenosed.  Mid RCA-2 lesion is 25% stenosed.  Dist RCA lesion is 40% stenosed.  Ost LM to Mid LM lesion is 60% stenosed.  IMPRESSION: Mr. Caravello has angiographic progression of his distal left main stenosis now in the 60+ percent range. His RCA stents are widely patent with mild intimal hyperplasia though this does not appear obstructive. He gives a classic description of new onset exertional chest pain which is fairly reproducible and he is inferolateral T wave inversion. His LVEDP was elevated. I believe he will require CABG. The sheath was removed and a TR band was placed on the right wrist to achieve patent hemostasis. The patient did receive hydralazine for hypertension. He left the lab in stable condition. Dr. Meda Coffee was made aware of these results. T CTS was notified.  Labs/Other Tests and Data Reviewed:    EKG:  No ECG reviewed.  Recent Labs: 10/15/2018: ALT 40 10/18/2018: Magnesium 2.5 10/20/2018: BUN 14; Creatinine, Ser 1.01; Hemoglobin 9.4; Platelets 169; Potassium 3.9; Sodium 134   Recent Lipid Panel Lab Results  Component Value Date/Time   CHOL 118 10/15/2018 04:02 AM   TRIG 60 10/15/2018 04:02 AM   HDL 48 10/15/2018 04:02 AM   CHOLHDL 2.5 10/15/2018 04:02 AM   LDLCALC 58 10/15/2018 04:02 AM    Wt Readings from Last 3 Encounters:  11/20/18 243 lb (110.2 kg)  10/22/18 240 lb 3.2 oz (109 kg)  07/17/18 239 lb (108.4 kg)     Objective:    Vital Signs:   Today's Vitals   12/20/18 0824  BP: 132/85  Pulse: 78  Weight: 247 lb (112 kg)  Height: 5' 11.5" (1.816 m)   Body mass index is 33.97 kg/m.  Normal affect. Normal speech pattern and tone. Comfortable, no apparent distress. No audible signs of SOB or wheezing.   ASSESSMENT & PLAN:    1. CAD -doing well after recent CABG - lower ASA to 81mg  daily, continue other meds. ACE-I stopped at dischage  10/2018, likely start back low dose at f/u pending bp's and renal function  2. Postop aflutter - we will d/c amiodarone  3. Leg pains - reorder ABIs and LE arterial US - continue to follow with Dr Fletcher Anon  4. Hyperlipidemia  -at goal, continue current meds  5. HTN  -at goal, continue currnet meds  COVID-19 Education: The signs and symptoms of COVID-19 were discussed with the patient and how to  seek care for testing (follow up with PCP or arrange E-visit).  The importance of social distancing was discussed today.  Time:   Today, I have spent 23 minutes with the patient with telehealth technology discussing the above problems.     Medication Adjustments/Labs and Tests Ordered: Current medicines are reviewed at length with the patient today.  Concerns regarding medicines are outlined above.   Tests Ordered: No orders of the defined types were placed in this encounter.   Medication Changes: No orders of the defined types were placed in this encounter.   Follow Up:  In Person in 4 month(s)  Signed, Carlyle Dolly, MD  12/20/2018 8:03 AM    [place]

## 2018-12-20 NOTE — Patient Instructions (Signed)
Medication Instructions:  Stop amiodarone   DECREASE ASPIRIN TO 81 MG DAILY    Labwork: NONE  Testing/Procedures: Your physician has requested that you have an ankle brachial index (ABI). During this test an ultrasound and blood pressure cuff are used to evaluate the arteries that supply the arms and legs with blood. Allow thirty minutes for this exam. There are no restrictions or special instructions.  Your physician has requested that you have a lower extremity arterial exercise duplex. During this test, exercise and ultrasound are used to evaluate arterial blood flow in the legs. Allow one hour for this exam. There are no restrictions or special instructions.   Follow-Up: Your physician recommends that you schedule a follow-up appointment in: 4 MONTHS    Any Other Special Instructions Will Be Listed Below (If Applicable).     If you need a refill on your cardiac medications before your next appointment, please call your pharmacy.

## 2018-12-20 NOTE — Addendum Note (Signed)
Addended byDebbora Lacrosse R on: 12/20/2018 10:04 AM   Modules accepted: Orders

## 2018-12-26 ENCOUNTER — Other Ambulatory Visit: Payer: Self-pay | Admitting: Surgical

## 2019-01-02 ENCOUNTER — Ambulatory Visit (HOSPITAL_COMMUNITY): Admission: RE | Admit: 2019-01-02 | Payer: BC Managed Care – PPO | Source: Ambulatory Visit

## 2019-01-07 ENCOUNTER — Telehealth: Payer: Self-pay | Admitting: Cardiology

## 2019-01-07 NOTE — Telephone Encounter (Signed)
Patient called requesting to know when he can return to work.  [phone].

## 2019-01-08 ENCOUNTER — Encounter: Payer: Self-pay | Admitting: *Deleted

## 2019-01-08 NOTE — Telephone Encounter (Signed)
Pt requesting to start work 2 weeks from today - says he is still somewhat sore. Has appt at [facility] tomorrow for arterial - this ok to create letter for 2 weeks from today?

## 2019-01-08 NOTE — Telephone Encounter (Signed)
Yes that is fine   J Mohamad Bruso MD 

## 2019-01-08 NOTE — Telephone Encounter (Signed)
Pt works at [employer] and wanted to know when he could return to work

## 2019-01-08 NOTE — Telephone Encounter (Signed)
I revewied Dr Hendickson's note and he no longer has any restrictions on the patient. From our standpoint we are ok with him getting back to work if he is feeling up to it. What about returning after labor day that Tuesday if he is up to it   Zandra Abts MD

## 2019-01-08 NOTE — Telephone Encounter (Signed)
Letter created and pt will come by office to pick up

## 2019-01-09 ENCOUNTER — Other Ambulatory Visit: Payer: Self-pay

## 2019-01-09 ENCOUNTER — Ambulatory Visit (HOSPITAL_COMMUNITY)
Admission: RE | Admit: 2019-01-09 | Discharge: 2019-01-09 | Disposition: A | Discharge status: Home / Self Care | Payer: BC Managed Care – PPO | Source: Ambulatory Visit | Attending: Cardiology | Admitting: Cardiology

## 2019-01-09 DIAGNOSIS — I739 Peripheral vascular disease, unspecified: Secondary | ICD-10-CM | POA: Diagnosis not present

## 2019-01-15 ENCOUNTER — Other Ambulatory Visit: Payer: Self-pay | Admitting: Thoracic Surgery (Cardiothoracic Vascular Surgery)

## 2019-01-15 DIAGNOSIS — G8918 Other acute postprocedural pain: Secondary | ICD-10-CM

## 2019-01-16 ENCOUNTER — Telehealth: Payer: Self-pay

## 2019-01-16 NOTE — Telephone Encounter (Signed)
Called pt. He is not available. Left message with pt's mother for him to return call.

## 2019-01-16 NOTE — Telephone Encounter (Signed)
-----   Message from Arnoldo Lenis, MD sent at 01/15/2019 11:47 AM EDT ----- US shows just mild blockages in the leg circulation, we will continue to monitor at this time   Zandra Abts MD

## 2019-01-17 ENCOUNTER — Telehealth: Payer: Self-pay | Admitting: Cardiology

## 2019-01-17 NOTE — Telephone Encounter (Signed)
Clarification on weight restriction. Patient thinks that he can not lift more that 5 pounds.

## 2019-01-17 NOTE — Telephone Encounter (Signed)
Left message returning call

## 2019-01-18 NOTE — Telephone Encounter (Signed)
Pt.notified

## 2019-01-18 NOTE — Telephone Encounter (Signed)
Below are the restrictions per his CT surgeon Dr Roxan Hockey. Essentially he has no further restrictions, including no weight restrictions.   "I encouraged him to gradually increase his walking.  He should not lift anything over 10 pounds before 1 August.  After that there were no restrictions on his activities.  He may drive.  Appropriate precautions were discussed."    Zandra Abts MD

## 2019-01-22 ENCOUNTER — Other Ambulatory Visit: Payer: Self-pay | Admitting: Thoracic Surgery (Cardiothoracic Vascular Surgery)

## 2019-01-22 DIAGNOSIS — G8918 Other acute postprocedural pain: Secondary | ICD-10-CM

## 2019-01-25 NOTE — Telephone Encounter (Signed)
Spoke with Katheran James  [phone] and they said Merry Proud had already resolved this patient issue

## 2019-02-27 ENCOUNTER — Other Ambulatory Visit: Payer: Self-pay | Admitting: Surgical

## 2019-02-27 ENCOUNTER — Other Ambulatory Visit: Payer: Self-pay | Admitting: Thoracic Surgery (Cardiothoracic Vascular Surgery)

## 2019-02-27 DIAGNOSIS — G8918 Other acute postprocedural pain: Secondary | ICD-10-CM

## 2019-03-10 ENCOUNTER — Other Ambulatory Visit: Payer: Self-pay | Admitting: Surgical

## 2019-03-10 ENCOUNTER — Other Ambulatory Visit: Payer: Self-pay | Admitting: Thoracic Surgery (Cardiothoracic Vascular Surgery)

## 2019-03-10 DIAGNOSIS — G8918 Other acute postprocedural pain: Secondary | ICD-10-CM

## 2019-04-19 ENCOUNTER — Ambulatory Visit: Payer: BC Managed Care – PPO | Admitting: Cardiology

## 2019-04-19 NOTE — Progress Notes (Deleted)
Clinical Summary Larry Pham is a 53 y.o.male 1. CAD  - prior PCI to RCA in [place], [place] in setting of inferior STEMI. Noted distal LM disease of 50% at that time.  - continued to have symptoms, seen at [facility]. [facility] MRI showed small RCA infarct with no current ischemic defects.  - 02/2012 echo 50-55% - Jan 2014 cath at [facility] with PCI to RCA with DES, left main disease described as 40% - Jan 2015 MPI without ischemia - 12/2014 MPI without clear ischemia.Small inferoseptal defect with mild reversibility, attenuation artifact vs small area of ischemia 04/2015 cath with moderate LM disease, patent RCA stents, mod LAD disease, severe stneosis small diastal LCX too small for PCI.  - imdur in the past reportedly made chest pain symptoms worst.    underwent CABG on 10/17/2018 with LIMA-LAD, SVG-OM, and SVG-PDA. Presented with unstable angina.  - 10/2018 echo LVEF LVEF >65%  - no SOB or DOE. No recent chest pain    2. Postop aflutter - occurred after recent CABG, started on amio - no recent palpitations.   3. Hyperlipidemia  - compliant with high dose statinand zetia.  10/2018 TC 118 TG 60 HDL 48 LDL 58   3. HTN  compliant with meds  5. PAD - followed by Dr Fletcher Anon - history of left SFA stenting in 06/2015 - angiography 12/2016 showed moderate nonobstructive right SFA disease and patent left SFA stent - has been on cilostazol  - still some painin rightcalfat about 75 yards.  - was to have repeat ABI and [place] in 07/2018 per Dr Fletcher Anon     Past Medical History:  Diagnosis Date  . CAD (coronary artery disease)    a. 2013 Inf STEMI s/p RCA DES. b. DES to RCA 2014 at [facility]. c. 04/2015 Cath: LM 40, LAD 40ost, 72m, D1 30, LCX 40m, OM1 50, RCA 30p, 20/71m, 50d. EF 55-65%. d. s/p CABG on 10/17/2018 with LIMA-LAD, SVG-OM, and SVG-PDA.  . Carotid arterial disease ([place])    a. 07/2015 Carotid U/S: <50% bilat.  . Carpal tunnel syndrome   . CKD (chronic kidney  disease), stage III ([place])   . CVA (cerebral vascular accident) ([place]) 12/09/2014   MRI brain=> remote infarcts seen in pons and left thalamus   . GERD (gastroesophageal reflux disease)   . Hepatic steatosis   . History of ETOH abuse   . Hyperlipidemia   . Hypertension   . MI (myocardial infarction) ([place]) 02-2012  . PAD (peripheral artery disease) ([place])    a. 06/2015 s/p L SFA stenting; b. 12/2016 PV Angio: R SFA 40-66m, L SFA patent stent-->Med Rx, cillostazol added.  . Seasonal allergies      Allergies  Allergen Reactions  . Imdur [Isosorbide Nitrate] Other (See Comments)    Made CP worse  . Bee Venom Swelling    SWELLING REACTION UNSPECIFIED      Current Outpatient Medications  Medication Sig Dispense Refill  . albuterol (PROVENTIL HFA;VENTOLIN HFA) 108 (90 BASE) MCG/ACT inhaler Inhale 2 puffs into the lungs every 6 (six) hours as needed for wheezing or shortness of breath. 1 Inhaler 2  . alfuzosin (UROXATRAL) 10 MG 24 hr tablet Take 10 mg by mouth daily.   11  . aspirin EC 81 MG tablet Take 81 mg by mouth daily.    Marland Kitchen atorvastatin (LIPITOR) 80 MG tablet Take 1 tablet (80 mg total) by mouth daily. 30 tablet 1  . cilostazol (PLETAL) 50 MG tablet Take 1 tablet (  50 mg total) by mouth 2 (two) times daily. 60 tablet 1  . ezetimibe (ZETIA) 10 MG tablet Take 1 tablet (10 mg total) by mouth daily. 30 tablet 1  . gabapentin (NEURONTIN) 600 MG tablet TAKE 1 TABLET BY MOUTH THREE TIMES A DAY 90 tablet 1  . hydrochlorothiazide (HYDRODIURIL) 25 MG tablet Take 1 tablet (25 mg total) by mouth daily. 30 tablet 1  . metoprolol tartrate (LOPRESSOR) 25 MG tablet Take 1 tablet (25 mg total) by mouth 2 (two) times daily. 60 tablet 1  . pantoprazole (PROTONIX) 20 MG tablet Take 1 tablet (20 mg total) by mouth daily. 30 tablet 5  . SUMAtriptan (IMITREX) 50 MG tablet Take 1 tablet by mouth every 2 (two) hours as needed for migraine or headache.   0  . tiZANidine (ZANAFLEX) 4 MG tablet Take 4 mg by mouth  daily.    . Vitamin D, Ergocalciferol, (DRISDOL) 50000 UNITS CAPS capsule Take 1 capsule by mouth 2 (two) times a week.  5   No current facility-administered medications for this visit.     Past Surgical History:  Procedure Laterality Date  . ABDOMINAL AORTOGRAM W/LOWER EXTREMITY N/A 01/04/2017   Procedure: ABDOMINAL AORTOGRAM W/LOWER EXTREMITY;  Surgeon: Wellington Hampshire, MD;  Location: [facility];  Service: Cardiovascular;  Laterality: N/A;  . CARDIAC CATHETERIZATION N/A 04/22/2015   Procedure: Left Heart Cath and Coronary Angiography;  Surgeon: Burnell Blanks, MD;  Location: [facility];  Service: Cardiovascular;  Laterality: N/A;  . CARDIAC CATHETERIZATION N/A 04/22/2015   Procedure: Intravascular Pressure Wire/FFR Study;  Surgeon: Burnell Blanks, MD;  Location: [facility];  Service: Cardiovascular;  Laterality: N/A;  . COLONOSCOPY WITH PROPOFOL N/A 03/23/2015   Procedure: COLONOSCOPY WITH PROPOFOL;  Surgeon: Daneil Dolin, MD;  Location: [facility];  Service: Endoscopy;  Laterality: N/A;  [place] time in 0849  time out  0959  total time 10 mintues  . CORONARY ANGIOPLASTY    . CORONARY ARTERY BYPASS GRAFT N/A 10/17/2018   Procedure: CORONARY ARTERY BYPASS GRAFTING (CABG) TIMES THREE USING LEFT INTERNAL MAMMARY ARTERY AND GREATER SAPHENOUS VEIN COLLECTED ENDOSCOPICALLY;  Surgeon: Melrose Nakayama, MD;  Location: [facility];  Service: Open Heart Surgery;  Laterality: N/A;  . CORONARY ARTERY CATHETERIZATION WITH STENTING N/A 02-2102 AND 05-2012  . LEFT FOREARM SURGERY     s/p machine accident  . LEFT HEART CATH AND CORONARY ANGIOGRAPHY N/A 10/15/2018   Procedure: LEFT HEART CATH AND CORONARY ANGIOGRAPHY;  Surgeon: Lorretta Harp, MD;  Location: [facility];  Service: Cardiovascular;  Laterality: N/A;  . LOWER EXTREMITY ANGIOGRAM Bilateral 07/01/2015   Procedure: Lower Extremity Angiogram;  Surgeon: Wellington Hampshire, MD;  Location: [facility];   Service: Cardiovascular;  Laterality: Bilateral;  . PERIPHERAL VASCULAR CATHETERIZATION N/A 07/01/2015   Procedure: Abdominal Aortogram;  Surgeon: Wellington Hampshire, MD;  Location: [facility];  Service: Cardiovascular;  Laterality: N/A;  . PERIPHERAL VASCULAR CATHETERIZATION Left 07/01/2015   Procedure: Peripheral Vascular Intervention;  Surgeon: Wellington Hampshire, MD;  Location: [facility];  Service: Cardiovascular;  Laterality: Left;  SFA  . POLYPECTOMY N/A 03/23/2015   Procedure: POLYPECTOMY;  Surgeon: Daneil Dolin, MD;  Location: [facility];  Service: Endoscopy;  Laterality: N/A;  cecal, descending colon  . TEE WITHOUT CARDIOVERSION N/A 10/17/2018   Procedure: TRANSESOPHAGEAL ECHOCARDIOGRAM (TEE);  Surgeon: Melrose Nakayama, MD;  Location: [facility];  Service: Open Heart Surgery;  Laterality: N/A;  . TRIGGER FINGER RELEASE     right 5th digit     Allergies  Allergen Reactions  . Imdur [Isosorbide Nitrate] Other (See Comments)    Made CP worse  . Bee Venom Swelling    SWELLING REACTION UNSPECIFIED       Family History  Problem Relation Age of Onset  . Hypertension Mother   . Heart disease Mother   . Cirrhosis Father        DIED AT AGE 2  . Breast cancer Paternal Grandmother        DECEASED  . Colon cancer Neg Hx      Social History Larry Pham reports that he quit smoking about 5 months ago. His smoking use included cigarettes. He started smoking about 41 years ago. He has a 30.00 pack-year smoking history. He has never used smokeless tobacco. Larry Pham reports current alcohol use.   Review of Systems CONSTITUTIONAL: No weight loss, fever, chills, weakness or fatigue.  HEENT: Eyes: No visual loss, blurred vision, double vision or yellow sclerae.No hearing loss, sneezing, congestion, runny nose or sore throat.  SKIN: No rash or itching.  CARDIOVASCULAR:  RESPIRATORY: No shortness of breath, cough or sputum.  GASTROINTESTINAL: No anorexia, nausea, vomiting or  diarrhea. No abdominal pain or blood.  GENITOURINARY: No burning on urination, no polyuria NEUROLOGICAL: No headache, dizziness, syncope, paralysis, ataxia, numbness or tingling in the extremities. No change in bowel or bladder control.  MUSCULOSKELETAL: No muscle, back pain, joint pain or stiffness.  LYMPHATICS: No enlarged nodes. No history of splenectomy.  PSYCHIATRIC: No history of depression or anxiety.  ENDOCRINOLOGIC: No reports of sweating, cold or heat intolerance. No polyuria or polydipsia.  Marland Kitchen   Physical Examination There were no vitals filed for this visit. There were no vitals filed for this visit.  Gen: resting comfortably, no acute distress HEENT: no scleral icterus, pupils equal round and reactive, no palptable cervical adenopathy,  CV Resp: Clear to auscultation bilaterally GI: abdomen is soft, non-tender, non-distended, normal bowel sounds, no hepatosplenomegaly MSK: extremities are warm, no edema.  Skin: warm, no rash Neuro:  no focal deficits Psych: appropriate affect   Diagnostic Studies 05/2013 MPI Analysis of the raw data showed no increased extracardiac radiotracer uptake. Analysis of the perfusion images showed a small mild fixed mid inferior wall defect. Left ventricular cavity size was normal. Regional wall motion was normal, indicating that the defect was due to soft tissue attenuation artifact. Left ventricular systolic function was calculated to be mildly reduced, EF 42%. However, systolic function appeared to be normal with an approximated EF of 50%.  IMPRESSION: 1. Normal myocardial perfusion imaging. No evidence of ischemia or scar.  2. Left ventricular systolic function was calculated to be mildly reduced, EF 42%. However, systolic function appeared to be grossly normal with an approximated EF of 50%.   05/2012 Cath at [facility] Cardiac Catheterization Results: Coronary arteries: Left main: 40% distal Left anterior descending:  30% ostial, scattered irregs Left circumflex: 40% mid, scattered irregs Right coronary: widely patent proximal stent, 70% mid, 40% at angle, diffuse irregs FFR of left main into LAD: 0.81  PCI Percutaneous coronary intervention performed of the mid RCA lesion which was stented with a 3.0 mm x 44mm Promus DES Results: 70% to normal  INTERVENTIONAL IMPRESSIONS and RECOMMENDATIONS: Successful PCI of the mid RCA lesion Aspirin 81 mg daily indefinitely Ticagrelor (Brilinta) 90 mg PO BID for at least 12 months Risk factor modification, smoking cessation  12/2014 Lexiscan MPI  This is a low risk study.  The left ventricular ejection fraction is mildly decreased (45-54%).  Small mild inferoseptal defect with mild reversibility. Differences in attenuation verse small area of ischemia, overall low risk finding.    04/2015 Carotid US IMPRESSION: No hemodynamically significant stenosis or plaque is noted in either cervical carotid artery.  04/2015 Cath  Ost 1st Mrg to 1st Mrg lesion, 50% stenosed.  1. Moderate left main stenosis, not flow limiting by FFR (FFR of 0.93) 2. Patent stents mid RCA with minimal restenosis.  3. Moderate distal RCA stenosis.  4. Moderate ostial LAD stenosis.  5. Severe stenosis small caliber distal AV groove Circumflex beyond the takeoff of the large OM Perline Awe. (too small for PCI) 6. Normal LV systolic function  Recommendations: He has moderate disease as described above with severe disease in the small caliber distal Circumflex. This vessel is too small for PCI. Continue medical management.    04/2015 Echo Study Conclusions  - Left ventricle: The cavity size was normal. Wall thickness was increased in a pattern of mild LVH. Systolic function was normal. The estimated ejection fraction was in the range of 50% to 55%. Features are consistent with a pseudonormal left ventricular filling pattern, with concomitant abnormal relaxation and  increased filling pressure (grade 2 diastolic dysfunction). Doppler parameters are consistent with high ventricular filling pressure. - Regional wall motion abnormality: Mild hypokinesis of the mid inferoseptal, mid inferior, and mid inferolateral myocardium. - Aortic valve: Mildly calcified annulus. Trileaflet. - Mitral valve: Calcified annulus.  12/2016 LE aniography 1. No significant aortoiliac disease. 2. Right lower extremity: Mild diffuse SFA disease with focal moderate 40-50% stenosis in the midsegment. Three-vessel runoff below the knee. 3. Left lower extremity: Mild diffuse SFA disease with patent stent in the midsegment with mild to moderate in-stent restenosis in three-vessel runoff below the knee.  Recommendations: I don't see a clear culprit for right calf claudication. Recommend continuing medical therapy and smoking cessation. Cilostazol can be considered.   Echocardiogram: 10/14/2018 IMPRESSIONS  1. The left ventricle has hyperdynamic systolic function, with an ejection fraction of >65%. The cavity size was normal. Left ventricular diastolic parameters were normal. 2. The right ventricle has normal systolic function. The cavity was normal. There is no increase in right ventricular wall thickness. 3. Left atrial size was moderately dilated. 4. Right atrial size was mildly dilated. 5. Moderate thickening of the mitral valve leaflet. 6. The aortic valve is tricuspid.   Cardiac Catheterization: 10/15/2018  Prox RCA to Mid RCA lesion is 25% stenosed.  Mid RCA-1 lesion is 50% stenosed.  Mid RCA-2 lesion is 25% stenosed.  Dist RCA lesion is 40% stenosed.  Ost LM to Mid LM lesion is 60% stenosed.  IMPRESSION: Larry Pham has angiographic progression of his distal left main stenosis now in the 60+ percent range. His RCA stents are widely patent with mild intimal hyperplasia though this does not appear obstructive. He gives a classic description of  new onset exertional chest pain which is fairly reproducible and he is inferolateral T wave inversion. His LVEDP was elevated. I believe he will require CABG. The sheath was removed and a TR band was placed on the right wrist to achieve patent hemostasis. The patient did receive hydralazine for hypertension. He left the lab in stable condition. Dr. Meda Coffee was made aware of these results. T CTS was notified.    Assessment and Plan  1. CAD -doing well after recent CABG - lower ASA to  81mg  daily, continue other meds. ACE-I stopped at dischage 10/2018, likely start back low dose at f/u pending bp's and renal function  2. Postop aflutter - we will d/c amiodarone  3. Leg pains - reorder ABIs and LE arterial US - continue to follow with Dr Fletcher Anon  4. Hyperlipidemia  -at goal, continue current meds  5. HTN  -at goal, continue currnet meds      Arnoldo Lenis, M.D., F.A.C.C.

## 2019-04-23 ENCOUNTER — Other Ambulatory Visit: Payer: Self-pay | Admitting: Surgical

## 2019-04-23 DIAGNOSIS — K219 Gastro-esophageal reflux disease without esophagitis: Secondary | ICD-10-CM

## 2019-04-29 ENCOUNTER — Other Ambulatory Visit: Payer: Self-pay | Admitting: Surgical

## 2019-05-27 ENCOUNTER — Telehealth (INDEPENDENT_AMBULATORY_CARE_PROVIDER_SITE_OTHER): Payer: Self-pay | Admitting: Cardiology

## 2019-05-27 VITALS — BP 208/80 | HR 79 | Ht 71.0 in | Wt 260.0 lb

## 2019-05-27 DIAGNOSIS — I2510 Atherosclerotic heart disease of native coronary artery without angina pectoris: Secondary | ICD-10-CM

## 2019-05-27 DIAGNOSIS — E782 Mixed hyperlipidemia: Secondary | ICD-10-CM

## 2019-05-27 DIAGNOSIS — I739 Peripheral vascular disease, unspecified: Secondary | ICD-10-CM

## 2019-05-27 DIAGNOSIS — I10 Essential (primary) hypertension: Secondary | ICD-10-CM

## 2019-05-27 MED ORDER — CILOSTAZOL 100 MG PO TABS: 100.0000 mg | ORAL_TABLET | Freq: Two times a day (BID) | ORAL | 3 refills | Status: DC

## 2019-05-27 MED ORDER — LISINOPRIL 10 MG PO TABS: 10.0000 mg | ORAL_TABLET | Freq: Every day | ORAL | 3 refills | Status: DC

## 2019-05-27 NOTE — Addendum Note (Signed)
Addended by: Debbora Lacrosse R on: 05/27/2019 11:52 AM   Modules accepted: Orders

## 2019-05-27 NOTE — Patient Instructions (Signed)
Medication Instructions:  START LISINOPRIL 10 MG DAILY   INCREASE CILOSTAZOL TO 100 MG -TWO TIMES DAILY   Labwork: 2 WEEKS  BMET  Testing/Procedures: NONE  Follow-Up: Your physician recommends that you schedule a follow-up appointment in: [place] [facility] [place] wants you to follow-up in: 6 MONTHS.  You will receive a reminder letter in the mail two months in advance. If you don't receive a letter, please call our office to schedule the follow-up appointment.   Any Other Special Instructions Will Be Listed Below (If Applicable).     If you need a refill on your cardiac medications before your next appointment, please call your pharmacy.

## 2019-05-27 NOTE — Progress Notes (Signed)
Virtual Visit via Telephone Note   This visit type was conducted due to national recommendations for restrictions regarding the COVID-19 Pandemic (e.g. social distancing) in an effort to limit this patient's exposure and mitigate transmission in our community.  Due to his co-morbid illnesses, this patient is at least at moderate risk for complications without adequate follow up.  This format is felt to be most appropriate for this patient at this time.  The patient did not have access to video technology/had technical difficulties with video requiring transitioning to audio format only (telephone).  All issues noted in this document were discussed and addressed.  No physical exam could be performed with this format.  Please refer to the patient's chart for his  consent to telehealth for [facility].   Date:  05/27/2019   ID:  Larry Pham, DOB July 09, 1965, MRN [id number]  Patient Location: Home Provider Location: Office  PCP:  Barry Dienes, NP  Cardiologist:  Carlyle Dolly, MD  Electrophysiologist:  None   Evaluation Performed:  Follow-Up Visit  Chief Complaint:  Follow up visit  History of Present Illness:    Larry Pham is a 54 y.o. male seen today for follow up of the following medical problems.  1. CAD  - prior PCI to RCA in [place], [place] in setting of inferior STEMI. Noted distal LM disease of 50% at that time.  - continued to have symptoms, seen at [facility]. [facility] MRI showed small RCA infarct with no current ischemic defects.  - 02/2012 echo 50-55% - Jan 2014 cath at [facility] with PCI to RCA with DES, left main disease described as 40% - Jan 2015 MPI without ischemia - 12/2014 MPI without clear ischemia.Small inferoseptal defect with mild reversibility, attenuation artifact vs small area of ischemia 04/2015 cath with moderate LM disease, patent RCA stents, mod LAD disease, severe stneosis small diastal LCX too small for PCI.  - imdur in the past reportedly made  chest pain symptoms worst.    underwent CABG on 10/17/2018 with LIMA-LAD, SVG-OM, and SVG-PDA. Presented with unstable angina.  - 10/2018 echo LVEF LVEF >65%  - no SOB or DOE. No recent chest pain  ACE-I stopped at dischage 10/2018, likely start back low dose at f/u pending bp's and renal function   - no recent chest pain. No SOB or DOE - compliant with meds   2. Postop aflutter - occurred after recent CABG, started on amio now off - no recent palpitations.   3. Hyperlipidemia  - compliant with high dose statinand zetia. 10/2018 TC 118 TG 60 HDL 48 LDL 58  - remains compliant with meds   4. HTN  compliant with meds - repeat bp 157/81 this AM from home monitor  5. PAD - followed by Dr Fletcher Anon - history of left SFA stenting in 06/2015 - angiography 12/2016 showed moderate nonobstructive right SFA disease and patent left SFA stent - has been on cilostazol  - still some painin rightcalfat about 75 yards.  - was to have repeat ABI and [place] in 07/2018 per Dr Fletcher Anon   - 01/2019 ABIs: Right 0.89 Left 1.1 - chronic stable symptoms unchanged    SH: recently retired   The patient does not have symptoms concerning for COVID-19 infection (fever, chills, cough, or new shortness of breath).    Past Medical History:  Diagnosis Date  . CAD (coronary artery disease)    a. 2013 Inf STEMI s/p RCA DES. b. DES to RCA 2014 at [facility] [place]. c. 04/2015  Cath: LM 40, LAD 40ost, 94m, D1 30, LCX 8m, OM1 50, RCA 30p, 20/9m, 50d. EF 55-65%. d. s/p CABG on 10/17/2018 with LIMA-LAD, SVG-OM, and SVG-PDA.  . Carotid arterial disease ([place])    a. 07/2015 Carotid U/S: <50% bilat.  . Carpal tunnel syndrome   . CKD (chronic kidney disease), stage III ([place])   . CVA (cerebral vascular accident) ([place]) 12/09/2014   MRI brain=> remote infarcts seen in pons and left thalamus   . GERD (gastroesophageal reflux disease)   . Hepatic steatosis   . History of ETOH abuse   . Hyperlipidemia   .  Hypertension   . MI (myocardial infarction) ([place]) 02-2012  . PAD (peripheral artery disease) ([place])    a. 06/2015 s/p L SFA stenting; b. 12/2016 PV Angio: R SFA 40-40m, L SFA patent stent-->Med Rx, cillostazol added.  . Seasonal allergies    Past Surgical History:  Procedure Laterality Date  . ABDOMINAL AORTOGRAM W/LOWER EXTREMITY N/A 01/04/2017   Procedure: ABDOMINAL AORTOGRAM W/LOWER EXTREMITY;  Surgeon: Wellington Hampshire, MD;  Location: [place] [facility];  Service: Cardiovascular;  Laterality: N/A;  . CARDIAC CATHETERIZATION N/A 04/22/2015   Procedure: Left Heart Cath and Coronary Angiography;  Surgeon: Burnell Blanks, MD;  Location: [place] [facility];  Service: Cardiovascular;  Laterality: N/A;  . CARDIAC CATHETERIZATION N/A 04/22/2015   Procedure: Intravascular Pressure Wire/FFR Study;  Surgeon: Burnell Blanks, MD;  Location: [place] [facility];  Service: Cardiovascular;  Laterality: N/A;  . COLONOSCOPY WITH PROPOFOL N/A 03/23/2015   Procedure: COLONOSCOPY WITH PROPOFOL;  Surgeon: Daneil Dolin, MD;  Location: [facility];  Service: Endoscopy;  Laterality: N/A;  [place] time in 0849  time out  0959  total time 10 mintues  . CORONARY ANGIOPLASTY    . CORONARY ARTERY BYPASS GRAFT N/A 10/17/2018   Procedure: CORONARY ARTERY BYPASS GRAFTING (CABG) TIMES THREE USING LEFT INTERNAL MAMMARY ARTERY AND GREATER SAPHENOUS VEIN COLLECTED ENDOSCOPICALLY;  Surgeon: Melrose Nakayama, MD;  Location: [place];  Service: Open Heart Surgery;  Laterality: N/A;  . CORONARY ARTERY CATHETERIZATION WITH STENTING N/A 02-2102 AND 05-2012  . LEFT FOREARM SURGERY     s/p machine accident  . LEFT HEART CATH AND CORONARY ANGIOGRAPHY N/A 10/15/2018   Procedure: LEFT HEART CATH AND CORONARY ANGIOGRAPHY;  Surgeon: Lorretta Harp, MD;  Location: [place] [facility];  Service: Cardiovascular;  Laterality: N/A;  . LOWER EXTREMITY ANGIOGRAM Bilateral 07/01/2015   Procedure: Lower Extremity Angiogram;  Surgeon:  Wellington Hampshire, MD;  Location: [place] [facility];  Service: Cardiovascular;  Laterality: Bilateral;  . PERIPHERAL VASCULAR CATHETERIZATION N/A 07/01/2015   Procedure: Abdominal Aortogram;  Surgeon: Wellington Hampshire, MD;  Location: [place] [facility];  Service: Cardiovascular;  Laterality: N/A;  . PERIPHERAL VASCULAR CATHETERIZATION Left 07/01/2015   Procedure: Peripheral Vascular Intervention;  Surgeon: Wellington Hampshire, MD;  Location: [place] [facility];  Service: Cardiovascular;  Laterality: Left;  SFA  . POLYPECTOMY N/A 03/23/2015   Procedure: POLYPECTOMY;  Surgeon: Daneil Dolin, MD;  Location: [facility];  Service: Endoscopy;  Laterality: N/A;  cecal, descending colon  . TEE WITHOUT CARDIOVERSION N/A 10/17/2018   Procedure: TRANSESOPHAGEAL ECHOCARDIOGRAM (TEE);  Surgeon: Melrose Nakayama, MD;  Location: [place];  Service: Open Heart Surgery;  Laterality: N/A;  . TRIGGER FINGER RELEASE     right 5th digit     No outpatient medications have been marked as taking for the 05/27/19 encounter (Appointment) with Carlyle Dolly  F, MD.     Allergies:   Imdur [isosorbide nitrate] and Bee venom   Social History   Tobacco Use  . Smoking status: Former Smoker    Packs/day: 1.00    Years: 30.00    Pack years: 30.00    Types: Cigarettes    Start date: 11/22/1977    Quit date: 10/30/2018    Years since quitting: 0.5  . Smokeless tobacco: Never Used  Substance Use Topics  . Alcohol use: Yes    Alcohol/week: 0.0 standard drinks    Comment: 6-7 beers per day   . Drug use: No     Family Hx: The patient's family history includes Breast cancer in his paternal grandmother; Cirrhosis in his father; Heart disease in his mother; Hypertension in his mother. There is no history of Colon cancer.  ROS:   Please see the history of present illness.     All other systems reviewed and are negative.   Prior CV studies:   The following studies were reviewed today:  05/2013 MPI Analysis of the raw  data showed no increased extracardiac radiotracer uptake. Analysis of the perfusion images showed a small mild fixed mid inferior wall defect. Left ventricular cavity size was normal. Regional wall motion was normal, indicating that the defect was due to soft tissue attenuation artifact. Left ventricular systolic function was calculated to be mildly reduced, EF 42%. However, systolic function appeared to be normal with an approximated EF of 50%.  IMPRESSION: 1. Normal myocardial perfusion imaging. No evidence of ischemia or scar.  2. Left ventricular systolic function was calculated to be mildly reduced, EF 42%. However, systolic function appeared to be grossly normal with an approximated EF of 50%.   05/2012 Cath at [facility] Cardiac Catheterization Results: Coronary arteries: Left main: 40% distal Left anterior descending: 30% ostial, scattered irregs Left circumflex: 40% mid, scattered irregs Right coronary: widely patent proximal stent, 70% mid, 40% at angle, diffuse irregs FFR of left main into LAD: 0.81  PCI Percutaneous coronary intervention performed of the mid RCA lesion which was stented with a 3.0 mm x 57mm Promus DES Results: 70% to normal  INTERVENTIONAL IMPRESSIONS and RECOMMENDATIONS: Successful PCI of the mid RCA lesion Aspirin 81 mg daily indefinitely Ticagrelor (Brilinta) 90 mg PO BID for at least 12 months Risk factor modification, smoking cessation  12/2014 Lexiscan MPI  This is a low risk study.  The left ventricular ejection fraction is mildly decreased (45-54%).  Small mild inferoseptal defect with mild reversibility. Differences in attenuation verse small area of ischemia, overall low risk finding.    04/2015 Carotid US IMPRESSION: No hemodynamically significant stenosis or plaque is noted in either cervical carotid artery.  04/2015 Cath  Ost 1st Mrg to 1st Mrg lesion, 50% stenosed.  1. Moderate left main stenosis, not flow  limiting by FFR (FFR of 0.93) 2. Patent stents mid RCA with minimal restenosis.  3. Moderate distal RCA stenosis.  4. Moderate ostial LAD stenosis.  5. Severe stenosis small caliber distal AV groove Circumflex beyond the takeoff of the large OM Tequita Marrs. (too small for PCI) 6. Normal LV systolic function  Recommendations: He has moderate disease as described above with severe disease in the small caliber distal Circumflex. This vessel is too small for PCI. Continue medical management.    04/2015 Echo Study Conclusions  - Left ventricle: The cavity size was normal. Wall thickness was increased in a pattern of mild LVH. Systolic function was normal. The estimated ejection  fraction was in the range of 50% to 55%. Features are consistent with a pseudonormal left ventricular filling pattern, with concomitant abnormal relaxation and increased filling pressure (grade 2 diastolic dysfunction). Doppler parameters are consistent with high ventricular filling pressure. - Regional wall motion abnormality: Mild hypokinesis of the mid inferoseptal, mid inferior, and mid inferolateral myocardium. - Aortic valve: Mildly calcified annulus. Trileaflet. - Mitral valve: Calcified annulus.  12/2016 LE aniography 1. No significant aortoiliac disease. 2. Right lower extremity: Mild diffuse SFA disease with focal moderate 40-50% stenosis in the midsegment. Three-vessel runoff below the knee. 3. Left lower extremity: Mild diffuse SFA disease with patent stent in the midsegment with mild to moderate in-stent restenosis in three-vessel runoff below the knee.  Recommendations: I don't see a clear culprit for right calf claudication. Recommend continuing medical therapy and smoking cessation. Cilostazol can be considered.   Echocardiogram: 10/14/2018 IMPRESSIONS  1. The left ventricle has hyperdynamic systolic function, with an ejection fraction of >65%. The cavity size was normal.  Left ventricular diastolic parameters were normal. 2. The right ventricle has normal systolic function. The cavity was normal. There is no increase in right ventricular wall thickness. 3. Left atrial size was moderately dilated. 4. Right atrial size was mildly dilated. 5. Moderate thickening of the mitral valve leaflet. 6. The aortic valve is tricuspid.   Cardiac Catheterization: 10/15/2018  Prox RCA to Mid RCA lesion is 25% stenosed.  Mid RCA-1 lesion is 50% stenosed.  Mid RCA-2 lesion is 25% stenosed.  Dist RCA lesion is 40% stenosed.  Ost LM to Mid LM lesion is 60% stenosed.  IMPRESSION: Mr. Haslem has angiographic progression of his distal left main stenosis now in the 60+ percent range. His RCA stents are widely patent with mild intimal hyperplasia though this does not appear obstructive. He gives a classic description of new onset exertional chest pain which is fairly reproducible and he is inferolateral T wave inversion. His LVEDP was elevated. I believe he will require CABG. The sheath was removed and a TR band was placed on the right wrist to achieve patent hemostasis. The patient did receive hydralazine for hypertension. He left the lab in stable condition. Dr. Meda Coffee was made aware of these results. T CTS was notified.  Labs/Other Tests and Data Reviewed:    EKG:  No ECG reviewed.  Recent Labs: 10/15/2018: ALT 40 10/18/2018: Magnesium 2.5 10/20/2018: BUN 14; Creatinine, Ser 1.01; Hemoglobin 9.4; Platelets 169; Potassium 3.9; Sodium 134   Recent Lipid Panel Lab Results  Component Value Date/Time   CHOL 118 10/15/2018 04:02 AM   TRIG 60 10/15/2018 04:02 AM   HDL 48 10/15/2018 04:02 AM   CHOLHDL 2.5 10/15/2018 04:02 AM   LDLCALC 58 10/15/2018 04:02 AM    Wt Readings from Last 3 Encounters:  12/20/18 247 lb (112 kg)  11/20/18 243 lb (110.2 kg)  10/22/18 240 lb 3.2 oz (109 kg)     Objective:    Vital Signs:   Today's Vitals   05/27/19 0929  BP:  (!) 208/80  Pulse: 79  Weight: 260 lb (117.9 kg)  Height: 5\' 11"  (1.803 m)   Body mass index is 36.26 kg/m. Normal affect. Normal speech pattern and tone. Comfortable, no apparent distress. No audibel signs of SOB or wheezing.   ASSESSMENT & PLAN:    1. CAD -no recent symptoms, continue current meds   2. Leg pains/PAD - chronic right calf pain. Recent ABI mild disease on right - increase cilostazol to 100mg  bid  4. Hyperlipidemia  Continue statin, request pcp labs  5. HTN  - elevated, accuracy of home cuff unclear - restart his lisinopril 10mg  daily for both bp and CV benefits. Check BMET 2 week - nursing visit for bp check 2 weeks  COVID-19 Education: The signs and symptoms of COVID-19 were discussed with the patient and how to seek care for testing (follow up with PCP or arrange E-visit).  The importance of social distancing was discussed today.  Time:   Today, I have spent 21 minutes with the patient with telehealth technology discussing the above problems.     Medication Adjustments/Labs and Tests Ordered: Current medicines are reviewed at length with the patient today.  Concerns regarding medicines are outlined above.   Tests Ordered: No orders of the defined types were placed in this encounter.   Medication Changes: No orders of the defined types were placed in this encounter.   Follow Up:  Either In Person or Virtual in 6 month(s)  Signed, Carlyle Dolly, MD  05/27/2019 8:15 AM    [place]

## 2019-06-12 ENCOUNTER — Ambulatory Visit (INDEPENDENT_AMBULATORY_CARE_PROVIDER_SITE_OTHER): Payer: Self-pay | Admitting: *Deleted

## 2019-06-12 VITALS — BP 173/93 | HR 75 | Ht 71.0 in | Wt 256.0 lb

## 2019-06-12 DIAGNOSIS — I10 Essential (primary) hypertension: Secondary | ICD-10-CM

## 2019-06-12 NOTE — Progress Notes (Signed)
Pt started on Lisinopril 10 mg. In for BP check. No complaints at this time. BP 173/93 HR 75.

## 2019-09-11 ENCOUNTER — Other Ambulatory Visit: Payer: Self-pay | Admitting: Cardiology

## 2019-10-02 ENCOUNTER — Other Ambulatory Visit: Payer: Self-pay | Admitting: Cardiology

## 2019-11-28 ENCOUNTER — Emergency Department (HOSPITAL_COMMUNITY): Payer: BC Managed Care – PPO

## 2019-11-28 ENCOUNTER — Encounter (HOSPITAL_COMMUNITY): Payer: Self-pay

## 2019-11-28 ENCOUNTER — Other Ambulatory Visit: Payer: Self-pay

## 2019-11-28 ENCOUNTER — Observation Stay (HOSPITAL_COMMUNITY)
Admission: EM | Admit: 2019-11-28 | Discharge: 2019-11-29 | Disposition: A | Discharge status: Home / Self Care | Payer: BC Managed Care – PPO | Attending: Internal Medicine | Admitting: Internal Medicine

## 2019-11-28 DIAGNOSIS — I2510 Atherosclerotic heart disease of native coronary artery without angina pectoris: Secondary | ICD-10-CM | POA: Diagnosis present

## 2019-11-28 DIAGNOSIS — Z7982 Long term (current) use of aspirin: Secondary | ICD-10-CM | POA: Insufficient documentation

## 2019-11-28 DIAGNOSIS — R079 Chest pain, unspecified: Secondary | ICD-10-CM | POA: Diagnosis present

## 2019-11-28 DIAGNOSIS — F1011 Alcohol abuse, in remission: Secondary | ICD-10-CM | POA: Diagnosis present

## 2019-11-28 DIAGNOSIS — I10 Essential (primary) hypertension: Secondary | ICD-10-CM | POA: Diagnosis not present

## 2019-11-28 DIAGNOSIS — E785 Hyperlipidemia, unspecified: Secondary | ICD-10-CM | POA: Diagnosis not present

## 2019-11-28 DIAGNOSIS — Z79899 Other long term (current) drug therapy: Secondary | ICD-10-CM | POA: Insufficient documentation

## 2019-11-28 DIAGNOSIS — Z20822 Contact with and (suspected) exposure to covid-19: Secondary | ICD-10-CM | POA: Diagnosis not present

## 2019-11-28 DIAGNOSIS — Z720 Tobacco use: Secondary | ICD-10-CM | POA: Diagnosis present

## 2019-11-28 DIAGNOSIS — R0789 Other chest pain: Principal | ICD-10-CM | POA: Insufficient documentation

## 2019-11-28 DIAGNOSIS — N183 Chronic kidney disease, stage 3 unspecified: Secondary | ICD-10-CM | POA: Diagnosis not present

## 2019-11-28 DIAGNOSIS — R519 Headache, unspecified: Secondary | ICD-10-CM | POA: Insufficient documentation

## 2019-11-28 DIAGNOSIS — I129 Hypertensive chronic kidney disease with stage 1 through stage 4 chronic kidney disease, or unspecified chronic kidney disease: Secondary | ICD-10-CM | POA: Insufficient documentation

## 2019-11-28 DIAGNOSIS — Z951 Presence of aortocoronary bypass graft: Secondary | ICD-10-CM | POA: Diagnosis not present

## 2019-11-28 DIAGNOSIS — Z87891 Personal history of nicotine dependence: Secondary | ICD-10-CM | POA: Diagnosis not present

## 2019-11-28 DIAGNOSIS — I25110 Atherosclerotic heart disease of native coronary artery with unstable angina pectoris: Secondary | ICD-10-CM | POA: Diagnosis not present

## 2019-11-28 DIAGNOSIS — F1010 Alcohol abuse, uncomplicated: Secondary | ICD-10-CM | POA: Diagnosis not present

## 2019-11-28 DIAGNOSIS — I252 Old myocardial infarction: Secondary | ICD-10-CM | POA: Diagnosis not present

## 2019-11-28 LAB — CBC WITH DIFFERENTIAL/PLATELET
Abs Immature Granulocytes: 0.02 10*3/uL (ref 0.00–0.07)
Basophils Absolute: 0 10*3/uL (ref 0.0–0.1)
Basophils Relative: 0 %
Eosinophils Absolute: 0 10*3/uL (ref 0.0–0.5)
Eosinophils Relative: 0 %
HCT: 50.2 % (ref 39.0–52.0)
Hemoglobin: 16.7 g/dL (ref 13.0–17.0)
Immature Granulocytes: 0 %
Lymphocytes Relative: 16 %
Lymphs Abs: 1.5 10*3/uL (ref 0.7–4.0)
MCH: 30.7 pg (ref 26.0–34.0)
MCHC: 33.3 g/dL (ref 30.0–36.0)
MCV: 92.3 fL (ref 80.0–100.0)
Monocytes Absolute: 0.9 10*3/uL (ref 0.1–1.0)
Monocytes Relative: 10 %
Neutro Abs: 6.5 10*3/uL (ref 1.7–7.7)
Neutrophils Relative %: 74 %
Platelets: 229 10*3/uL (ref 150–400)
RBC: 5.44 MIL/uL (ref 4.22–5.81)
RDW: 14.9 % (ref 11.5–15.5)
WBC: 8.9 10*3/uL (ref 4.0–10.5)
nRBC: 0 % (ref 0.0–0.2)

## 2019-11-28 LAB — LIPID PANEL
Cholesterol: 129 mg/dL (ref 0–200)
HDL: 49 mg/dL (ref >40)
LDL Cholesterol: 60 mg/dL (ref 0–99)
Total CHOL/HDL Ratio: 2.6 RATIO
Triglycerides: 98 mg/dL (ref <150)
VLDL: 20 mg/dL (ref 0–40)

## 2019-11-28 LAB — BASIC METABOLIC PANEL
Anion gap: 15 (ref 5–15)
BUN: 20 mg/dL (ref 6–20)
CO2: 25 mmol/L (ref 22–32)
Calcium: 10.3 mg/dL (ref 8.9–10.3)
Chloride: 100 mmol/L (ref 98–111)
Creatinine, Ser: 1.27 mg/dL — ABNORMAL HIGH (ref 0.61–1.24)
GFR calc Af Amer: >60 mL/min (ref >60)
GFR calc non Af Amer: >60 mL/min (ref >60)
Glucose, Bld: 96 mg/dL (ref 70–99)
Potassium: 4.1 mmol/L (ref 3.5–5.1)
Sodium: 140 mmol/L (ref 135–145)

## 2019-11-28 LAB — TROPONIN I (HIGH SENSITIVITY)
Troponin I (High Sensitivity): 31 ng/L — ABNORMAL HIGH (ref <18)
Troponin I (High Sensitivity): 28 ng/L — ABNORMAL HIGH (ref <18)
Troponin I (High Sensitivity): 29 ng/L — ABNORMAL HIGH (ref <18)

## 2019-11-28 LAB — SARS CORONAVIRUS 2 BY RT PCR (HOSPITAL ORDER, PERFORMED IN ~~LOC~~ HOSPITAL LAB): SARS Coronavirus 2: NEGATIVE

## 2019-11-28 MED ORDER — DIPHENHYDRAMINE HCL 50 MG/ML IJ SOLN
12.5000 mg | Freq: Once | INTRAMUSCULAR | Status: AC
  Administered 2019-11-28: 12.5 mg via INTRAVENOUS
  Filled 2019-11-28: qty 1

## 2019-11-28 MED ORDER — METOCLOPRAMIDE HCL 5 MG/ML IJ SOLN
5.0000 mg | Freq: Once | INTRAMUSCULAR | Status: AC
  Administered 2019-11-28: 5 mg via INTRAVENOUS
  Filled 2019-11-28: qty 2

## 2019-11-28 MED ORDER — ACETAMINOPHEN 325 MG PO TABS: 650.0000 mg | ORAL_TABLET | ORAL | Status: DC | PRN

## 2019-11-28 MED ORDER — HYDRALAZINE HCL 20 MG/ML IJ SOLN: 10.0000 mg | Freq: Four times a day (QID) | INTRAMUSCULAR | Status: DC | PRN

## 2019-11-28 MED ORDER — ALFUZOSIN HCL ER 10 MG PO TB24
10.0000 mg | ORAL_TABLET | Freq: Every day | ORAL | Status: DC
  Administered 2019-11-28 – 2019-11-29 (×2): 10 mg via ORAL
  Filled 2019-11-28 (×5): qty 1

## 2019-11-28 MED ORDER — CILOSTAZOL 100 MG PO TABS
50.0000 mg | ORAL_TABLET | Freq: Two times a day (BID) | ORAL | Status: DC
  Administered 2019-11-28 – 2019-11-29 (×2): 50 mg via ORAL
  Filled 2019-11-28 (×2): qty 1

## 2019-11-28 MED ORDER — HYDROCHLOROTHIAZIDE 25 MG PO TABS
25.0000 mg | ORAL_TABLET | Freq: Every day | ORAL | Status: DC
  Administered 2019-11-28 – 2019-11-29 (×2): 25 mg via ORAL
  Filled 2019-11-28 (×2): qty 1

## 2019-11-28 MED ORDER — PANTOPRAZOLE SODIUM 20 MG PO TBEC
20.0000 mg | DELAYED_RELEASE_TABLET | Freq: Every day | ORAL | Status: DC
  Filled 2019-11-28 (×2): qty 1

## 2019-11-28 MED ORDER — ONDANSETRON HCL 4 MG/2ML IJ SOLN: 4.0000 mg | Freq: Four times a day (QID) | INTRAMUSCULAR | Status: DC | PRN

## 2019-11-28 MED ORDER — EZETIMIBE 10 MG PO TABS
10.0000 mg | ORAL_TABLET | Freq: Every day | ORAL | Status: DC
  Administered 2019-11-28 – 2019-11-29 (×2): 10 mg via ORAL
  Filled 2019-11-28 (×2): qty 1

## 2019-11-28 MED ORDER — SODIUM CHLORIDE 0.9 % IV BOLUS
500.0000 mL | Freq: Once | INTRAVENOUS | Status: AC
  Administered 2019-11-28: 500 mL via INTRAVENOUS

## 2019-11-28 MED ORDER — LISINOPRIL 10 MG PO TABS
20.0000 mg | ORAL_TABLET | Freq: Every day | ORAL | Status: DC
  Administered 2019-11-28 – 2019-11-29 (×2): 20 mg via ORAL
  Filled 2019-11-28 (×2): qty 2

## 2019-11-28 MED ORDER — ENOXAPARIN SODIUM 40 MG/0.4ML ~~LOC~~ SOLN
40.0000 mg | SUBCUTANEOUS | Status: DC
  Administered 2019-11-28: 40 mg via SUBCUTANEOUS
  Filled 2019-11-28: qty 0.4

## 2019-11-28 MED ORDER — GABAPENTIN 300 MG PO CAPS
600.0000 mg | ORAL_CAPSULE | Freq: Three times a day (TID) | ORAL | Status: DC
  Administered 2019-11-28 – 2019-11-29 (×3): 600 mg via ORAL
  Filled 2019-11-28 (×3): qty 2

## 2019-11-28 MED ORDER — ATORVASTATIN CALCIUM 40 MG PO TABS
80.0000 mg | ORAL_TABLET | Freq: Every day | ORAL | Status: DC
  Administered 2019-11-28 – 2019-11-29 (×2): 80 mg via ORAL
  Filled 2019-11-28 (×2): qty 2

## 2019-11-28 MED ORDER — ASPIRIN EC 81 MG PO TBEC
81.0000 mg | DELAYED_RELEASE_TABLET | Freq: Every day | ORAL | Status: DC
  Administered 2019-11-28 – 2019-11-29 (×2): 81 mg via ORAL
  Filled 2019-11-28 (×2): qty 1

## 2019-11-28 MED ORDER — METOPROLOL SUCCINATE ER 25 MG PO TB24
25.0000 mg | ORAL_TABLET | Freq: Two times a day (BID) | ORAL | Status: DC
  Administered 2019-11-28: 25 mg via ORAL
  Filled 2019-11-28 (×2): qty 1

## 2019-11-28 NOTE — ED Notes (Signed)
ED TO INPATIENT HANDOFF REPORT  ED Nurse Name and Phone #:    S Name/Age/Gender Leonie Man Gren 54 y.o. male Room/Bed: APA01/APA01  Code Status   Code Status: Prior  Home/SNF/Other Home Patient oriented to: self, place, time and situation Is this baseline? Yes   Triage Complete: Triage complete  Chief Complaint Chest pain [R07.9]  Triage Note Pt c/o headache, high bp, diaphoresis, and inability to sleep for the past week.  Reports is on bp medication and has been taking it as prescribed.  Reports bp was 244/121 today.  Pt also c/o eyes red and watery.    Allergies Allergies  Allergen Reactions  . Imdur [Isosorbide Nitrate] Other (See Comments)    Made CP worse  . Bee Venom Swelling    SWELLING REACTION UNSPECIFIED     Level of Care/Admitting Diagnosis ED Disposition    ED Disposition Condition [facility]: [facility] [[id number]]  Level of Care: Telemetry [5]  Covid Evaluation: Asymptomatic Screening Protocol (No Symptoms)  Diagnosis: Chest pain [160737]  Admitting Physician: [place], [place]  Attending Physician: Kathie Dike [[id number]]       B Medical/Surgery History Past Medical History:  Diagnosis Date  . CAD (coronary artery disease)    a. 2013 Inf STEMI s/p RCA DES. b. DES to RCA 2014 at [facility]. c. 04/2015 Cath: LM 40, LAD 40ost, 16m, D1 30, LCX 79m, OM1 50, RCA 30p, 20/32m, 50d. EF 55-65%. d. s/p CABG on 10/17/2018 with LIMA-LAD, SVG-OM, and SVG-PDA.  . Carotid arterial disease ([place])    a. 07/2015 Carotid U/S: <50% bilat.  . Carpal tunnel syndrome   . CKD (chronic kidney disease), stage III   . CVA (cerebral vascular accident) ([place]) 12/09/2014   MRI brain=> remote infarcts seen in pons and left thalamus   . GERD (gastroesophageal reflux disease)   . Hepatic steatosis   . History of ETOH abuse   . Hyperlipidemia   . Hypertension   . MI (myocardial infarction) ([place]) 02-2012  . PAD (peripheral artery disease) ([place])     a. 06/2015 s/p L SFA stenting; b. 12/2016 PV Angio: R SFA 40-77m, L SFA patent stent-->Med Rx, cillostazol added.  . Seasonal allergies    Past Surgical History:  Procedure Laterality Date  . ABDOMINAL AORTOGRAM W/LOWER EXTREMITY N/A 01/04/2017   Procedure: ABDOMINAL AORTOGRAM W/LOWER EXTREMITY;  Surgeon: Wellington Hampshire, MD;  Location: [place] [facility];  Service: Cardiovascular;  Laterality: N/A;  . CARDIAC CATHETERIZATION N/A 04/22/2015   Procedure: Left Heart Cath and Coronary Angiography;  Surgeon: Burnell Blanks, MD;  Location: [place] [facility];  Service: Cardiovascular;  Laterality: N/A;  . CARDIAC CATHETERIZATION N/A 04/22/2015   Procedure: Intravascular Pressure Wire/FFR Study;  Surgeon: Burnell Blanks, MD;  Location: [place] [facility];  Service: Cardiovascular;  Laterality: N/A;  . COLONOSCOPY WITH PROPOFOL N/A 03/23/2015   Procedure: COLONOSCOPY WITH PROPOFOL;  Surgeon: Daneil Dolin, MD;  Location: [facility];  Service: Endoscopy;  Laterality: N/A;  [place] time in 0849  time out  0959  total time 10 mintues  . CORONARY ANGIOPLASTY    . CORONARY ARTERY BYPASS GRAFT N/A 10/17/2018   Procedure: CORONARY ARTERY BYPASS GRAFTING (CABG) TIMES THREE USING LEFT INTERNAL MAMMARY ARTERY AND GREATER SAPHENOUS VEIN COLLECTED ENDOSCOPICALLY;  Surgeon: Melrose Nakayama, MD;  Location: [place];  Service: Open Heart Surgery;  Laterality: N/A;  . CORONARY ARTERY CATHETERIZATION WITH STENTING N/A 02-2102 AND 05-2012  .  LEFT FOREARM SURGERY     s/p machine accident  . LEFT HEART CATH AND CORONARY ANGIOGRAPHY N/A 10/15/2018   Procedure: LEFT HEART CATH AND CORONARY ANGIOGRAPHY;  Surgeon: Lorretta Harp, MD;  Location: [place] [facility];  Service: Cardiovascular;  Laterality: N/A;  . LOWER EXTREMITY ANGIOGRAM Bilateral 07/01/2015   Procedure: Lower Extremity Angiogram;  Surgeon: Wellington Hampshire, MD;  Location: [place] [facility];  Service: Cardiovascular;  Laterality: Bilateral;  .  PERIPHERAL VASCULAR CATHETERIZATION N/A 07/01/2015   Procedure: Abdominal Aortogram;  Surgeon: Wellington Hampshire, MD;  Location: [place] [facility];  Service: Cardiovascular;  Laterality: N/A;  . PERIPHERAL VASCULAR CATHETERIZATION Left 07/01/2015   Procedure: Peripheral Vascular Intervention;  Surgeon: Wellington Hampshire, MD;  Location: [place] [facility];  Service: Cardiovascular;  Laterality: Left;  SFA  . POLYPECTOMY N/A 03/23/2015   Procedure: POLYPECTOMY;  Surgeon: Daneil Dolin, MD;  Location: [facility];  Service: Endoscopy;  Laterality: N/A;  cecal, descending colon  . TEE WITHOUT CARDIOVERSION N/A 10/17/2018   Procedure: TRANSESOPHAGEAL ECHOCARDIOGRAM (TEE);  Surgeon: Melrose Nakayama, MD;  Location: [place];  Service: Open Heart Surgery;  Laterality: N/A;  . TRIGGER FINGER RELEASE     right 5th digit     A IV Location/Drains/Wounds Patient Lines/Drains/Airways Status    Active Line/Drains/Airways    Name Placement date Placement time Site Days   Peripheral IV 11/28/19 Right Hand 11/28/19  1116  Hand  less than 1   Incision (Closed) 10/17/18 Leg Right 10/17/18  1050   407   Incision (Closed) 10/17/18 Chest Other (Comment) 10/17/18  1050   407          Intake/Output Last 24 hours  Intake/Output Summary (Last 24 hours) at 11/28/2019 1907 Last data filed at 11/28/2019 1301 Gross per 24 hour  Intake 500 ml  Output --  Net 500 ml    Labs/Imaging Results for orders placed or performed during the hospital encounter of 11/28/19 (from the past 48 hour(s))  CBC with Differential     Status: None   Collection Time: 11/28/19 10:28 AM  Result Value Ref Range   WBC 8.9 4.0 - 10.5 K/uL   RBC 5.44 4.22 - 5.81 MIL/uL   Hemoglobin 16.7 13.0 - 17.0 g/dL   HCT 50.2 39 - 52 %   MCV 92.3 80.0 - 100.0 fL   MCH 30.7 26.0 - 34.0 pg   MCHC 33.3 30.0 - 36.0 g/dL   RDW 14.9 11.5 - 15.5 %   Platelets 229 150 - 400 K/uL   nRBC 0.0 0.0 - 0.2 %   Neutrophils Relative % 74 %   Neutro Abs 6.5 1.7  - 7.7 K/uL   Lymphocytes Relative 16 %   Lymphs Abs 1.5 0.7 - 4.0 K/uL   Monocytes Relative 10 %   Monocytes Absolute 0.9 0 - 1 K/uL   Eosinophils Relative 0 %   Eosinophils Absolute 0.0 0 - 0 K/uL   Basophils Relative 0 %   Basophils Absolute 0.0 0 - 0 K/uL   Immature Granulocytes 0 %   Abs Immature Granulocytes 0.02 0.00 - 0.07 K/uL    Comment: Performed at [facility], [address] [place], [place] [address]  Basic metabolic panel     Status: Abnormal   Collection Time: 11/28/19 10:28 AM  Result Value Ref Range   Sodium 140 135 - 145 mmol/L   Potassium 4.1 3.5 - 5.1 mmol/L   Chloride 100 98 -  111 mmol/L   CO2 25 22 - 32 mmol/L   Glucose, Bld 96 70 - 99 mg/dL    Comment: Glucose reference range applies only to samples taken after fasting for at least 8 hours.   BUN 20 6 - 20 mg/dL   Creatinine, Ser 1.27 (H) 0.61 - 1.24 mg/dL   Calcium 10.3 8.9 - 10.3 mg/dL   GFR calc non Af Amer >60 >60 mL/min   GFR calc Af Amer >60 >60 mL/min   Anion gap 15 5 - 15    Comment: Performed at [facility], [address], [place], [place] [address]  Troponin I (High Sensitivity)     Status: Abnormal   Collection Time: 11/28/19 10:28 AM  Result Value Ref Range   Troponin I (High Sensitivity) 31 (H) <18 ng/L    Comment: (NOTE) Elevated high sensitivity troponin I (hsTnI) values and significant  changes across serial measurements may suggest ACS but many other  chronic and acute conditions are known to elevate hsTnI results.  Refer to the "Links" section for chest pain algorithms and additional  guidance. Performed at [facility], [address] [place], [place] [address]   Troponin I (High Sensitivity)     Status: Abnormal   Collection Time: 11/28/19 12:20 PM  Result Value Ref Range   Troponin I (High Sensitivity) 28 (H) <18 ng/L    Comment: (NOTE) Elevated high sensitivity troponin I (hsTnI) values and significant  changes across serial measurements may suggest ACS but many other   chronic and acute conditions are known to elevate hsTnI results.  Refer to the "Links" section for chest pain algorithms and additional  guidance. Performed at [facility], [address], [place], [place] [address]   SARS Coronavirus 2 by RT PCR (hospital order, performed in [facility] hospital lab) Nasopharyngeal Nasopharyngeal Swab     Status: None   Collection Time: 11/28/19  1:01 PM   Specimen: Nasopharyngeal Swab  Result Value Ref Range   SARS Coronavirus 2 NEGATIVE NEGATIVE    Comment: (NOTE) SARS-CoV-2 target nucleic acids are NOT DETECTED.  The SARS-CoV-2 RNA is generally detectable in upper and lower respiratory specimens during the acute phase of infection. The lowest concentration of SARS-CoV-2 viral copies this assay can detect is 250 copies / mL. A negative result does not preclude SARS-CoV-2 infection and should not be used as the sole basis for treatment or other patient management decisions.  A negative result may occur with improper specimen collection / handling, submission of specimen other than nasopharyngeal swab, presence of viral mutation(s) within the areas targeted by this assay, and inadequate number of viral copies (<250 copies / mL). A negative result must be combined with clinical observations, patient history, and epidemiological information.  Fact Sheet for Patients:   StrictlyIdeas.no  Fact Sheet for Healthcare Providers: BankingDealers.co.za  This test is not yet approved or  cleared by the [place] FDA and has been authorized for detection and/or diagnosis of SARS-CoV-2 by FDA under an Emergency Use Authorization (EUA).  This EUA will remain in effect (meaning this test can be used) for the duration of the COVID-19 declaration under Section 564(b)(1) of the Act, 21 U.S.C. section 360bbb-3(b)(1), unless the authorization is terminated or revoked sooner.  Performed at [facility], [address], [place],  [address]    CT Head Wo Contrast  Result Date: 11/28/2019 CLINICAL DATA:  Headache, hypertension EXAM: CT HEAD WITHOUT CONTRAST TECHNIQUE: Contiguous axial images were obtained from the base  of the skull through the vertex without intravenous contrast. COMPARISON:  10/14/2018 FINDINGS: Brain: No evidence of acute infarction, hemorrhage, hydrocephalus, extra-axial collection or mass lesion/mass effect. Vascular: Atherosclerotic calcifications involving the large vessels of the skull base. No unexpected hyperdense vessel. Skull: Normal. Negative for fracture or focal lesion. Sinuses/Orbits: No acute finding. Other: None. IMPRESSION: No acute intracranial findings. Electronically Signed   By: Davina Poke D.O.   On: 11/28/2019 11:05   DG Chest Portable 1 View  Result Date: 11/28/2019 CLINICAL DATA:  Chest pain EXAM: PORTABLE CHEST 1 VIEW COMPARISON:  11/20/2018, 10/14/2018 FINDINGS: Post CABG changes. Normal heart size. Calcified granuloma in the peripheral aspect of the right lung base. No focal airspace consolidation, pleural effusion, or pneumothorax. IMPRESSION: No active disease. Electronically Signed   By: Davina Poke D.O.   On: 11/28/2019 10:58    Pending Labs Unresulted Labs (From admission, onward) Comment         None      Vitals/Pain Today's Vitals   11/28/19 1606 11/28/19 1631 11/28/19 1758 11/28/19 1828  BP: (!) 141/128 (!) 156/105 (!) 137/96 (!) 149/101  Pulse: 90 93 91 100  Resp: (!) 25 (!) 26  19  Temp:      TempSrc:      SpO2: 100% 100%  100%  Weight:      Height:      PainSc:        Isolation Precautions No active isolations  Medications Medications  metoCLOPramide (REGLAN) injection 5 mg (5 mg Intravenous Given 11/28/19 1117)  diphenhydrAMINE (BENADRYL) injection 12.5 mg (12.5 mg Intravenous Given 11/28/19 1119)  sodium chloride 0.9 % bolus 500 mL (0 mLs Intravenous Stopped 11/28/19 1301)    Mobility walks Low fall risk    Focused Assessments   R Recommendations: See Admitting Provider Note  Report given to:   Additional Notes:

## 2019-11-28 NOTE — ED Provider Notes (Signed)
[facility] Provider Note   CSN: [id number] Arrival date & time: 11/28/19  2130     History Chief Complaint  Patient presents with  . Hypertension    Larry Pham is a 54 y.o. male history of CAD, CKD, CVA, GERD, alcohol abuse, hypertension, hyperlipidemia, PAD.  Patient presents today for concern of hypertension.  He reports that over the last 4 days he has had multiple blood pressure readings with systolic greater than 865 at home despite compliance with his antihypertensive lisinopril.  He reports that yesterday he developed some right-sided chest pain which she described as a dull sensation which lasted around 30 minutes, nonradiating gradually resolved without intervention.  He reports he was sitting on his home when the pain began and was not exerting himself.  Patient reports that over the last 3-4 days he has had a mild posterior headache which is an aching sensation nonradiating no clear aggravating or alleviating factors.  He reports that he gets this headache whenever his blood pressure is elevated and it is similar to his headaches in the past.  Additionally patient reports that he has had red watery eyes for the past 2-3 days as well but denies change to vision.  Patient reports one episode of nonbloody diarrhea this morning.  Denies fever/chills, vision change, difficulty speaking, neck pain, shortness of breath, cough/hemoptysis, abdominal pain, nausea/vomiting, numbness/weakness, tingling, extremity swelling/color change or any additional concerns.  HPI     Past Medical History:  Diagnosis Date  . CAD (coronary artery disease)    a. 2013 Inf STEMI s/p RCA DES. b. DES to RCA 2014 at [facility]. c. 04/2015 Cath: LM 40, LAD 40ost, 80m, D1 30, LCX 1m, OM1 50, RCA 30p, 20/88m, 50d. EF 55-65%. d. s/p CABG on 10/17/2018 with LIMA-LAD, SVG-OM, and SVG-PDA.  . Carotid arterial disease ([place])    a. 07/2015 Carotid U/S: <50% bilat.  . Carpal tunnel syndrome   . CKD  (chronic kidney disease), stage III   . CVA (cerebral vascular accident) ([place]) 12/09/2014   MRI brain=> remote infarcts seen in pons and left thalamus   . GERD (gastroesophageal reflux disease)   . Hepatic steatosis   . History of ETOH abuse   . Hyperlipidemia   . Hypertension   . MI (myocardial infarction) ([place]) 02-2012  . PAD (peripheral artery disease) ([place])    a. 06/2015 s/p L SFA stenting; b. 12/2016 PV Angio: R SFA 40-51m, L SFA patent stent-->Med Rx, cillostazol added.  . Seasonal allergies     Patient Active Problem List   Diagnosis Date Noted  . S/P CABG x 3 10/17/2018  . Chest pain 10/14/2018  . Hypotension 10/14/2018  . Unstable angina ([place]) 10/14/2018  . CKD (chronic kidney disease), stage II 02/18/2016  . ETOH abuse 02/18/2016  . Noncompliance 02/18/2016  . Chest pain on exertion 02/17/2016  . PAD (peripheral artery disease) ([place]) 06/23/2015  . Coronary artery disease involving native coronary artery of native heart with angina pectoris ([place])   . Diverticulosis of colon without hemorrhage   . History of colonic polyps   . Screening for colon cancer 03/09/2015  . CAD (coronary artery disease), native coronary artery 06/19/2012  . 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 Tobacco use 06/19/2012  . History of alcohol abuse 06/19/2012  . Hyperlipidemia with target LDL less than 70 06/19/2012  . Essential hypertension 06/19/2012    Past Surgical History:  Procedure Laterality Date  . ABDOMINAL AORTOGRAM W/LOWER EXTREMITY N/A 01/04/2017  Procedure: ABDOMINAL AORTOGRAM W/LOWER EXTREMITY;  Surgeon: Wellington Hampshire, MD;  Location: [facility];  Service: Cardiovascular;  Laterality: N/A;  . CARDIAC CATHETERIZATION N/A 04/22/2015   Procedure: Left Heart Cath and Coronary Angiography;  Surgeon: Burnell Blanks, MD;  Location: [facility];  Service: Cardiovascular;  Laterality: N/A;  . CARDIAC CATHETERIZATION N/A 04/22/2015   Procedure:  Intravascular Pressure Wire/FFR Study;  Surgeon: Burnell Blanks, MD;  Location: [facility];  Service: Cardiovascular;  Laterality: N/A;  . COLONOSCOPY WITH PROPOFOL N/A 03/23/2015   Procedure: COLONOSCOPY WITH PROPOFOL;  Surgeon: Daneil Dolin, MD;  Location: [facility];  Service: Endoscopy;  Laterality: N/A;  [place] time in 0849  time out  0959  total time 10 mintues  . CORONARY ANGIOPLASTY    . CORONARY ARTERY BYPASS GRAFT N/A 10/17/2018   Procedure: CORONARY ARTERY BYPASS GRAFTING (CABG) TIMES THREE USING LEFT INTERNAL MAMMARY ARTERY AND GREATER SAPHENOUS VEIN COLLECTED ENDOSCOPICALLY;  Surgeon: Melrose Nakayama, MD;  Location: [facility];  Service: Open Heart Surgery;  Laterality: N/A;  . CORONARY ARTERY CATHETERIZATION WITH STENTING N/A 02-2102 AND 05-2012  . LEFT FOREARM SURGERY     s/p machine accident  . LEFT HEART CATH AND CORONARY ANGIOGRAPHY N/A 10/15/2018   Procedure: LEFT HEART CATH AND CORONARY ANGIOGRAPHY;  Surgeon: Lorretta Harp, MD;  Location: [facility];  Service: Cardiovascular;  Laterality: N/A;  . LOWER EXTREMITY ANGIOGRAM Bilateral 07/01/2015   Procedure: Lower Extremity Angiogram;  Surgeon: Wellington Hampshire, MD;  Location: [facility];  Service: Cardiovascular;  Laterality: Bilateral;  . PERIPHERAL VASCULAR CATHETERIZATION N/A 07/01/2015   Procedure: Abdominal Aortogram;  Surgeon: Wellington Hampshire, MD;  Location: [facility];  Service: Cardiovascular;  Laterality: N/A;  . PERIPHERAL VASCULAR CATHETERIZATION Left 07/01/2015   Procedure: Peripheral Vascular Intervention;  Surgeon: Wellington Hampshire, MD;  Location: [facility];  Service: Cardiovascular;  Laterality: Left;  SFA  . POLYPECTOMY N/A 03/23/2015   Procedure: POLYPECTOMY;  Surgeon: Daneil Dolin, MD;  Location: [facility];  Service: Endoscopy;  Laterality: N/A;  cecal, descending colon  . TEE WITHOUT CARDIOVERSION N/A 10/17/2018   Procedure: TRANSESOPHAGEAL ECHOCARDIOGRAM (TEE);   Surgeon: Melrose Nakayama, MD;  Location: [facility];  Service: Open Heart Surgery;  Laterality: N/A;  . TRIGGER FINGER RELEASE     right 5th digit       Family History  Problem Relation Age of Onset  . Hypertension Mother   . Heart disease Mother   . Cirrhosis Father        DIED AT AGE 22  . Breast cancer Paternal Grandmother        DECEASED  . Colon cancer Neg Hx     Social History   Tobacco Use  . Smoking status: Former Smoker    Packs/day: 1.00    Years: 30.00    Pack years: 30.00    Types: Cigarettes    Start date: 11/22/1977    Quit date: 10/30/2018    Years since quitting: 1.0  . Smokeless tobacco: Never Used  Vaping Use  . Vaping Use: Never used  Substance Use Topics  . Alcohol use: Yes    Alcohol/week: 0.0 standard drinks    Comment: occ  . Drug use: No    Home Medications Prior to Admission medications   Medication Sig Start Date End Date Taking? Authorizing Provider  albuterol (PROVENTIL HFA;VENTOLIN HFA) 108 (90 BASE) MCG/ACT inhaler Inhale  2 puffs into the lungs every 6 (six) hours as needed for wheezing or shortness of breath. 04/13/15  Yes BranchAlphonse Guild, MD  aspirin EC 81 MG tablet Take 81 mg by mouth daily.   Yes [provider]  atorvastatin (LIPITOR) 80 MG tablet Take 1 tablet (80 mg total) by mouth daily. 10/22/18  Yes Gold, Wayne E, PA-C  cilostazol (PLETAL) 100 MG tablet TAKE 1 TABLET BY MOUTH TWICE DAILY Patient taking differently: Take 50 mg by mouth 2 (two) times daily.  10/02/19  Yes Branch, Alphonse Guild, MD  ezetimibe (ZETIA) 10 MG tablet TAKE 1 TABLET BY MOUTH EVERY DAY Patient taking differently: Take 10 mg by mouth daily.  04/29/19  Yes Ivin Poot, MD  gabapentin (NEURONTIN) 600 MG tablet TAKE 1 TABLET BY MOUTH THREE TIMES A DAY Patient taking differently: Take 600 mg by mouth 3 (three) times daily.  01/15/19  Yes Gold, Wayne E, PA-C  hydrochlorothiazide (HYDRODIURIL) 25 MG tablet Take 1 tablet (25 mg total) by mouth daily.  10/22/18  Yes Gold, Wayne E, PA-C  lisinopril (ZESTRIL) 10 MG tablet TAKE 1 TABLET BY MOUTH ONCE DAILY. Patient taking differently: Take 20 mg by mouth daily.  09/11/19  Yes BranchAlphonse Guild, MD  metoprolol succinate (TOPROL-XL) 25 MG 24 hr tablet Take 25 mg by mouth in the morning and at bedtime.  11/08/19  Yes [provider]  pantoprazole (PROTONIX) 20 MG tablet Take 1 tablet (20 mg total) by mouth daily. Patient taking differently: Take 20 mg by mouth once a week.  10/24/18  Yes Gold, Patrick Jupiter E, PA-C  Vitamin D, Ergocalciferol, (DRISDOL) 50000 UNITS CAPS capsule Take 1 capsule by mouth 2 (two) times a week. 02/02/15  Yes [provider]  alfuzosin (UROXATRAL) 10 MG 24 hr tablet Take 10 mg by mouth daily.  Patient not taking: Reported on 11/28/2019 05/08/15   [provider]  metoprolol tartrate (LOPRESSOR) 25 MG tablet Take 1 tablet (25 mg total) by mouth 2 (two) times daily. Patient not taking: Reported on 11/28/2019 10/22/18   John Giovanni, PA-C    Allergies    Imdur [isosorbide nitrate] and Bee venom  Review of Systems   Review of Systems Ten systems are reviewed and are negative for acute change except as noted in the HPI  Physical Exam Updated Vital Signs BP (!) 152/100   Pulse 81   Temp 98.5 F (36.9 C) (Oral)   Resp 23   Ht 5\' 11"  (1.803 m)   Wt 113.4 kg   SpO2 100%   BMI 34.87 kg/m   Physical Exam Constitutional:      General: He is not in acute distress.    Appearance: Normal appearance. He is well-developed. He is not ill-appearing or diaphoretic.  HENT:     Head: Normocephalic and atraumatic.  Eyes:     General: Vision grossly intact. Gaze aligned appropriately.     Pupils: Pupils are equal, round, and reactive to light.  Neck:     Trachea: Trachea and phonation normal.  Pulmonary:     Effort: Pulmonary effort is normal. No respiratory distress.  Abdominal:     General: There is no distension.     Palpations: Abdomen is soft.      Tenderness: There is no abdominal tenderness. There is no guarding or rebound.  Musculoskeletal:        General: Normal range of motion.     Cervical back: Normal range of motion.  Skin:  General: Skin is warm and dry.  Neurological:     Mental Status: He is alert.     GCS: GCS eye subscore is 4. GCS verbal subscore is 5. GCS motor subscore is 6.     Comments: Speech is clear and goal oriented, follows commands Major Cranial nerves without deficit, no facial droop Normal strength in upper and lower extremities bilaterally including dorsiflexion and plantar flexion, strong and equal grip strength Sensation normal to light and sharp touch Moves extremities without ataxia, coordination intact Normal finger to nose and rapid alternating movements No pronator drift Normal heel-shin  Psychiatric:        Behavior: Behavior normal.    ED Results / Procedures / Treatments   Labs (all labs ordered are listed, but only abnormal results are displayed) Labs Reviewed  BASIC METABOLIC PANEL - Abnormal; Notable for the following components:      Result Value   Creatinine, Ser 1.27 (*)    All other components within normal limits  TROPONIN I (HIGH SENSITIVITY) - Abnormal; Notable for the following components:   Troponin I (High Sensitivity) 31 (*)    All other components within normal limits  TROPONIN I (HIGH SENSITIVITY) - Abnormal; Notable for the following components:   Troponin I (High Sensitivity) 28 (*)    All other components within normal limits  SARS CORONAVIRUS 2 BY RT PCR (HOSPITAL ORDER, [facility])  CBC WITH DIFFERENTIAL/PLATELET    EKG EKG Interpretation  Date/Time:  Thursday November 28 2019 10:12:01 EDT Ventricular Rate:  90 PR Interval:    QRS Duration: 92 QT Interval:  361 QTC Calculation: 442 R Axis:   73 Text Interpretation: Sinus rhythm Nonspecific T wave abnormality Confirmed by Veryl Speak [phone]) on 11/28/2019 10:22:52  AM   Radiology CT Head Wo Contrast  Result Date: 11/28/2019 CLINICAL DATA:  Headache, hypertension EXAM: CT HEAD WITHOUT CONTRAST TECHNIQUE: Contiguous axial images were obtained from the base of the skull through the vertex without intravenous contrast. COMPARISON:  10/14/2018 FINDINGS: Brain: No evidence of acute infarction, hemorrhage, hydrocephalus, extra-axial collection or mass lesion/mass effect. Vascular: Atherosclerotic calcifications involving the large vessels of the skull base. No unexpected hyperdense vessel. Skull: Normal. Negative for fracture or focal lesion. Sinuses/Orbits: No acute finding. Other: None. IMPRESSION: No acute intracranial findings. Electronically Signed   By: Davina Poke D.O.   On: 11/28/2019 11:05   DG Chest Portable 1 View  Result Date: 11/28/2019 CLINICAL DATA:  Chest pain EXAM: PORTABLE CHEST 1 VIEW COMPARISON:  11/20/2018, 10/14/2018 FINDINGS: Post CABG changes. Normal heart size. Calcified granuloma in the peripheral aspect of the right lung base. No focal airspace consolidation, pleural effusion, or pneumothorax. IMPRESSION: No active disease. Electronically Signed   By: Davina Poke D.O.   On: 11/28/2019 10:58    Procedures Procedures (including critical care time)  Medications Ordered in ED Medications  metoCLOPramide (REGLAN) injection 5 mg (5 mg Intravenous Given 11/28/19 1117)  diphenhydrAMINE (BENADRYL) injection 12.5 mg (12.5 mg Intravenous Given 11/28/19 1119)  sodium chloride 0.9 % bolus 500 mL (0 mLs Intravenous Stopped 11/28/19 1301)    ED Course  I have reviewed the triage vital signs and the nursing notes.  Pertinent labs & imaging results that were available during my care of the patient were reviewed by me and considered in my medical decision making (see chart for details).  Clinical Course as of Nov 28 1343  Thu Nov 28, 2019  1252 Dr. [name]   [BM]  87 Hospitalist   [BM]    Clinical Course User Index [BM] Gari Crown   MDM Rules/Calculators/A&P                          Additional history obtained from: 1. Nursing notes from this visit. 2. Electronic medical record reviewed including patient's left heart cath in June 2020. ------------- I ordered, reviewed and interpreted labs which include: CBC within normal limits, no leukocytosis to suggest infection and no evidence of anemia. BMP shows mild elevation of creatinine at 1.27, baseline appears around 1. High-sensitivity troponin elevated at 31  CXR:    IMPRESSION:  No active disease.  I personally viewed patient's chest x-ray and agree with radiologist interpretation.  CT head:  IMPRESSION:  No acute intracranial findings.  I have personally reviewed patient's CT head and agree with radiologist interpretation above.  EKG: Sinus rhythm Nonspecific T wave abnormality Confirmed by Veryl Speak [phone]) on 11/28/2019 10:22:52 AM -------------------- Patient has remained chest pain-free throughout ER stay.  On reevaluation he reports that his headache is completely resolved following Benadryl and Reglan.  He was sleeping on reevaluation easily arousable to voice.  Reports he is feeling well.  Given patient's extensive past medical history and elevated troponin in the setting of chest pain I consulted patient's cardiologist Dr. [name] who is also on-call today.  Dr. [name] has asked that we admit patient to hospitalist service.  Will admit patient for chest pain rule out at this time.  Patient is pain-free without shortness of breath no tachycardia or hypoxia, doubt pulmonary embolism at this time.  Additionally history and presentation inconsistent with dissection, pneumonia, anemia or other emergent pathologies at this time. I discussed the plan of care with the patient and he is agreeable for admission.  Case was discussed with Dr. Stark Jock during this visit who agrees with work-up and admission at this time.  Discussed case with Dr. Roderic Palau,  patient has been accepted to hospitalist service.  Note: Portions of this report may have been transcribed using voice recognition software. Every effort was made to ensure accuracy; however, inadvertent computerized transcription errors may still be present. Final Clinical Impression(s) / ED Diagnoses Final diagnoses:  Chest pain, unspecified type    Rx / DC Orders ED Discharge Orders    None       Gari Crown 11/28/19 1345    Veryl Speak, MD 11/29/19 [phone]

## 2019-11-28 NOTE — Progress Notes (Signed)
Patient presents with significant HTN at home and chest pain. Mild nonspecific troponin thus far, EKG shows no specific ischemic changes. Needs cycling of enzymes overnight with repeat EKG in AM, echo. Further cardiology recommendations pending initial workup by primary team, full consult to follow in AM once more data is available to decide on next clinical steps   Zandra Abts MD

## 2019-11-28 NOTE — ED Triage Notes (Addendum)
Pt c/o headache, high bp, diaphoresis, and inability to sleep for the past week.  Reports is on bp medication and has been taking it as prescribed.  Reports bp was 244/121 today.  Pt also c/o eyes red and watery.

## 2019-11-28 NOTE — H&P (Signed)
History and Physical    Larry Pham EXN:[id number] DOB: [birth year] DOA: 11/28/2019  PCP: [facility]  Patient coming from: Home  I have personally briefly reviewed patient's old medical records in [place]  Chief Complaint: High blood pressure  HPI: Larry Pham is a 54 y.o. male with medical history significant of hypertension, coronary artery disease, tobacco use, alcohol use, peripheral arterial disease, presents to the emergency room with complaints of high blood pressure.  Reports that over the past 2 to 3 days, he has had blood pressures ranging above 200.  On his last check, blood pressure was noted to be 245/122.  Patient has been having associated occipital headaches which radiate down his neck into her shoulders.  He also describes some right-sided chest discomfort.  Denies any shortness of breath.  He has been somewhat dizzy on standing.  He reports feeling confused over the past few days, seeing things that he feels were not there.  He has not had any fever.  He has been diaphoretic.  He describes loose stools over the past few days.  P.o. intake has been poor.  He reports compliance with his medications.  No new medications, including over-the-counter medications.  ED Course: He was evaluated emergency room where blood pressure was noted to be stable.  Cardiac enzymes were unremarkable.  EKG did not show any acute changes.  Due to his symptoms and severely elevated blood pressure, admission has been requested.  Review of Systems: As per HPI otherwise 10 point review of systems negative.    Past Medical History:  Diagnosis Date  . CAD (coronary artery disease)    a. 2013 Inf STEMI s/p RCA DES. b. DES to RCA 2014 at [facility]. c. 04/2015 Cath: LM 40, LAD 40ost, 73m, D1 30, LCX 36m, OM1 50, RCA 30p, 20/74m, 50d. EF 55-65%. d. s/p CABG on 10/17/2018 with LIMA-LAD, SVG-OM, and SVG-PDA.  . Carotid arterial disease ([place])    a. 07/2015 Carotid U/S: <50%  bilat.  . Carpal tunnel syndrome   . CKD (chronic kidney disease), stage III   . CVA (cerebral vascular accident) ([place]) 12/09/2014   MRI brain=> remote infarcts seen in pons and left thalamus   . GERD (gastroesophageal reflux disease)   . Hepatic steatosis   . History of ETOH abuse   . Hyperlipidemia   . Hypertension   . MI (myocardial infarction) ([place]) 02-2012  . PAD (peripheral artery disease) ([place])    a. 06/2015 s/p L SFA stenting; b. 12/2016 PV Angio: R SFA 40-25m, L SFA patent stent-->Med Rx, cillostazol added.  . Seasonal allergies     Past Surgical History:  Procedure Laterality Date  . ABDOMINAL AORTOGRAM W/LOWER EXTREMITY N/A 01/04/2017   Procedure: ABDOMINAL AORTOGRAM W/LOWER EXTREMITY;  Surgeon: Wellington Hampshire, MD;  Location: [place] [facility];  Service: Cardiovascular;  Laterality: N/A;  . CARDIAC CATHETERIZATION N/A 04/22/2015   Procedure: Left Heart Cath and Coronary Angiography;  Surgeon: Burnell Blanks, MD;  Location: [place] [facility];  Service: Cardiovascular;  Laterality: N/A;  . CARDIAC CATHETERIZATION N/A 04/22/2015   Procedure: Intravascular Pressure Wire/FFR Study;  Surgeon: Burnell Blanks, MD;  Location: [place] [facility];  Service: Cardiovascular;  Laterality: N/A;  . COLONOSCOPY WITH PROPOFOL N/A 03/23/2015   Procedure: COLONOSCOPY WITH PROPOFOL;  Surgeon: Daneil Dolin, MD;  Location: [facility];  Service: Endoscopy;  Laterality: N/A;  [place] time in 0849  time out  0959  total  time 10 mintues  . CORONARY ANGIOPLASTY    . CORONARY ARTERY BYPASS GRAFT N/A 10/17/2018   Procedure: CORONARY ARTERY BYPASS GRAFTING (CABG) TIMES THREE USING LEFT INTERNAL MAMMARY ARTERY AND GREATER SAPHENOUS VEIN COLLECTED ENDOSCOPICALLY;  Surgeon: Melrose Nakayama, MD;  Location: [place];  Service: Open Heart Surgery;  Laterality: N/A;  . CORONARY ARTERY CATHETERIZATION WITH STENTING N/A 02-2102 AND 05-2012  . LEFT FOREARM SURGERY     s/p machine accident  . LEFT  HEART CATH AND CORONARY ANGIOGRAPHY N/A 10/15/2018   Procedure: LEFT HEART CATH AND CORONARY ANGIOGRAPHY;  Surgeon: Lorretta Harp, MD;  Location: [place] [facility];  Service: Cardiovascular;  Laterality: N/A;  . LOWER EXTREMITY ANGIOGRAM Bilateral 07/01/2015   Procedure: Lower Extremity Angiogram;  Surgeon: Wellington Hampshire, MD;  Location: [place] [facility];  Service: Cardiovascular;  Laterality: Bilateral;  . PERIPHERAL VASCULAR CATHETERIZATION N/A 07/01/2015   Procedure: Abdominal Aortogram;  Surgeon: Wellington Hampshire, MD;  Location: [place] [facility];  Service: Cardiovascular;  Laterality: N/A;  . PERIPHERAL VASCULAR CATHETERIZATION Left 07/01/2015   Procedure: Peripheral Vascular Intervention;  Surgeon: Wellington Hampshire, MD;  Location: [place] [facility];  Service: Cardiovascular;  Laterality: Left;  SFA  . POLYPECTOMY N/A 03/23/2015   Procedure: POLYPECTOMY;  Surgeon: Daneil Dolin, MD;  Location: [facility];  Service: Endoscopy;  Laterality: N/A;  cecal, descending colon  . TEE WITHOUT CARDIOVERSION N/A 10/17/2018   Procedure: TRANSESOPHAGEAL ECHOCARDIOGRAM (TEE);  Surgeon: Melrose Nakayama, MD;  Location: [place];  Service: Open Heart Surgery;  Laterality: N/A;  . TRIGGER FINGER RELEASE     right 5th digit    Social History:  reports that he quit smoking about 12 months ago. His smoking use included cigarettes. He started smoking about 42 years ago. He has a 30.00 pack-year smoking history. He has never used smokeless tobacco. He reports current alcohol use. He reports that he does not use drugs.  Allergies  Allergen Reactions  . Imdur [Isosorbide Nitrate] Other (See Comments)    Made CP worse  . Bee Venom Swelling    SWELLING REACTION UNSPECIFIED     Family History  Problem Relation Age of Onset  . Hypertension Mother   . Heart disease Mother   . Cirrhosis Father        DIED AT AGE 63  . Breast cancer Paternal Grandmother        DECEASED  . Colon cancer Neg Hx     Prior  to Admission medications   Medication Sig Start Date End Date Taking? Authorizing Provider  albuterol (PROVENTIL HFA;VENTOLIN HFA) 108 (90 BASE) MCG/ACT inhaler Inhale 2 puffs into the lungs every 6 (six) hours as needed for wheezing or shortness of breath. 04/13/15  Yes BranchAlphonse Guild, MD  aspirin EC 81 MG tablet Take 81 mg by mouth daily.   Yes [provider]  atorvastatin (LIPITOR) 80 MG tablet Take 1 tablet (80 mg total) by mouth daily. 10/22/18  Yes Gold, Wayne E, PA-C  cilostazol (PLETAL) 100 MG tablet TAKE 1 TABLET BY MOUTH TWICE DAILY Patient taking differently: Take 50 mg by mouth 2 (two) times daily.  10/02/19  Yes Branch, Alphonse Guild, MD  ezetimibe (ZETIA) 10 MG tablet TAKE 1 TABLET BY MOUTH EVERY DAY Patient taking differently: Take 10 mg by mouth daily.  04/29/19  Yes Ivin Poot, MD  gabapentin (NEURONTIN) 600 MG tablet TAKE 1 TABLET BY MOUTH THREE TIMES A DAY Patient taking differently:  Take 600 mg by mouth 3 (three) times daily.  01/15/19  Yes Gold, Wayne E, PA-C  hydrochlorothiazide (HYDRODIURIL) 25 MG tablet Take 1 tablet (25 mg total) by mouth daily. 10/22/18  Yes Gold, Wayne E, PA-C  lisinopril (ZESTRIL) 10 MG tablet TAKE 1 TABLET BY MOUTH ONCE DAILY. Patient taking differently: Take 20 mg by mouth daily.  09/11/19  Yes BranchAlphonse Guild, MD  metoprolol succinate (TOPROL-XL) 25 MG 24 hr tablet Take 25 mg by mouth in the morning and at bedtime.  11/08/19  Yes [provider]  pantoprazole (PROTONIX) 20 MG tablet Take 1 tablet (20 mg total) by mouth daily. Patient taking differently: Take 20 mg by mouth once a week.  10/24/18  Yes Gold, Patrick Jupiter E, PA-C  Vitamin D, Ergocalciferol, (DRISDOL) 50000 UNITS CAPS capsule Take 1 capsule by mouth 2 (two) times a week. 02/02/15  Yes [provider]  alfuzosin (UROXATRAL) 10 MG 24 hr tablet Take 10 mg by mouth daily.  Patient not taking: Reported on 11/28/2019 05/08/15   [provider]  metoprolol tartrate  (LOPRESSOR) 25 MG tablet Take 1 tablet (25 mg total) by mouth 2 (two) times daily. Patient not taking: Reported on 11/28/2019 10/22/18   John Giovanni, PA-C    Physical Exam: Vitals:   11/28/19 1758 11/28/19 1828 11/28/19 1858 11/28/19 1906  BP: (!) 137/96 (!) 149/101 (!) 158/97   Pulse: 91 100 100 96  Resp:  19  16  Temp:      TempSrc:      SpO2:  100%  100%  Weight:      Height:        Constitutional: NAD, calm, comfortable Eyes: PERRL, lids and conjunctivae normal ENMT: Mucous membranes are moist. Posterior pharynx clear of any exudate or lesions.Normal dentition.  Neck: normal, supple, no masses, no thyromegaly Respiratory: clear to auscultation bilaterally, no wheezing, no crackles. Normal respiratory effort. No accessory muscle use.  Cardiovascular: Regular rate and rhythm, no murmurs / rubs / gallops. No extremity edema. 2+ pedal pulses. No carotid bruits.  Abdomen: no tenderness, no masses palpated. No hepatosplenomegaly. Bowel sounds positive.  Musculoskeletal: no clubbing / cyanosis. No joint deformity upper and lower extremities. Good ROM, no contractures. Normal muscle tone.  Skin: no rashes, lesions, ulcers. No induration Neurologic: CN 2-12 grossly intact. Sensation intact, DTR normal. Strength 5/5 in all 4.  Psychiatric: Normal judgment and insight. Alert and oriented x 3. Normal mood.    Labs on Admission: I have personally reviewed following labs and imaging studies  CBC: Recent Labs  Lab 11/28/19 1028  WBC 8.9  NEUTROABS 6.5  HGB 16.7  HCT 50.2  MCV 92.3  PLT 053   Basic Metabolic Panel: Recent Labs  Lab 11/28/19 1028  NA 140  K 4.1  CL 100  CO2 25  GLUCOSE 96  BUN 20  CREATININE 1.27*  CALCIUM 10.3   GFR: Estimated Creatinine Clearance: 83.1 mL/min (A) (by C-G formula based on SCr of 1.27 mg/dL (H)). Liver Function Tests: No results for input(s): AST, ALT, ALKPHOS, BILITOT, PROT, ALBUMIN in the last 168 hours. No results for input(s):  LIPASE, AMYLASE in the last 168 hours. No results for input(s): AMMONIA in the last 168 hours. Coagulation Profile: No results for input(s): INR, PROTIME in the last 168 hours. Cardiac Enzymes: No results for input(s): CKTOTAL, CKMB, CKMBINDEX, TROPONINI in the last 168 hours. BNP (last 3 results) No results for input(s): PROBNP in the last 8760 hours. HbA1C: No  results for input(s): HGBA1C in the last 72 hours. CBG: No results for input(s): GLUCAP in the last 168 hours. Lipid Profile: No results for input(s): CHOL, HDL, LDLCALC, TRIG, CHOLHDL, LDLDIRECT in the last 72 hours. Thyroid Function Tests: No results for input(s): TSH, T4TOTAL, FREET4, T3FREE, THYROIDAB in the last 72 hours. Anemia Panel: No results for input(s): VITAMINB12, FOLATE, FERRITIN, TIBC, IRON, RETICCTPCT in the last 72 hours. Urine analysis:    Component Value Date/Time   COLORURINE YELLOW 10/16/2018 2333   APPEARANCEUR CLEAR 10/16/2018 2333   LABSPEC 1.024 10/16/2018 2333   PHURINE 6.0 10/16/2018 2333   GLUCOSEU NEGATIVE 10/16/2018 2333   HGBUR NEGATIVE 10/16/2018 2333   [place] NEGATIVE 10/16/2018 2333   KETONESUR NEGATIVE 10/16/2018 2333   PROTEINUR NEGATIVE 10/16/2018 2333   NITRITE NEGATIVE 10/16/2018 2333   LEUKOCYTESUR NEGATIVE 10/16/2018 2333    Radiological Exams on Admission: CT Head Wo Contrast  Result Date: 11/28/2019 CLINICAL DATA:  Headache, hypertension EXAM: CT HEAD WITHOUT CONTRAST TECHNIQUE: Contiguous axial images were obtained from the base of the skull through the vertex without intravenous contrast. COMPARISON:  10/14/2018 FINDINGS: Brain: No evidence of acute infarction, hemorrhage, hydrocephalus, extra-axial collection or mass lesion/mass effect. Vascular: Atherosclerotic calcifications involving the large vessels of the skull base. No unexpected hyperdense vessel. Skull: Normal. Negative for fracture or focal lesion. Sinuses/Orbits: No acute finding. Other: None. IMPRESSION: No  acute intracranial findings. Electronically Signed   By: Davina Poke D.O.   On: 11/28/2019 11:05   DG Chest Portable 1 View  Result Date: 11/28/2019 CLINICAL DATA:  Chest pain EXAM: PORTABLE CHEST 1 VIEW COMPARISON:  11/20/2018, 10/14/2018 FINDINGS: Post CABG changes. Normal heart size. Calcified granuloma in the peripheral aspect of the right lung base. No focal airspace consolidation, pleural effusion, or pneumothorax. IMPRESSION: No active disease. Electronically Signed   By: Davina Poke D.O.   On: 11/28/2019 10:58    EKG: Independently reviewed.  No specific ischemic EKG changes  Assessment/Plan Active Problems:   CAD (coronary artery disease), native coronary artery   Tobacco use   History of alcohol abuse   Hyperlipidemia with target LDL less than 70   Essential hypertension   Chest pain     1. Possible hypertensive urgency.  Patient reported blood pressure greater than 494 systolic at home.  He reports compliance with his medications.  Will monitor blood pressure overnight here.  Continue on his home medications of lisinopril and hydrochlorothiazide.  Check urine drug screen. 2. Chest discomfort.  Case was reviewed by cardiology with recommendations for overnight admission for enzymes and echocardiogram in the morning.  He does have a history of coronary artery disease.  Continue aspirin.  We will keep n.p.o. after midnight in case further work-up is needed. 3. Alcohol use.  Reports that he is not had any alcohol in a week.  He usually just drinks on weekends.  Denies any history of withdrawal in the past. 4. Tobacco use.  Counseled on the importance of back cessation. 5. Hyperlipidemia.  Continue statin  DVT prophylaxis: Lovenox Code Status: Full code Family Communication: Discussed with patient Disposition Plan: Discharge home tomorrow if cleared by cardiology Consults called: Cardiology Admission status: Observation, telemetry  Kathie Dike MD [facility]   If 7PM-7AM, please contact night-coverage [email or web address]   11/28/2019, 8:28 PM

## 2019-11-28 NOTE — ED Notes (Signed)
Attempted report x1. 

## 2019-11-29 ENCOUNTER — Observation Stay (HOSPITAL_BASED_OUTPATIENT_CLINIC_OR_DEPARTMENT_OTHER): Payer: BC Managed Care – PPO

## 2019-11-29 DIAGNOSIS — N183 Chronic kidney disease, stage 3 unspecified: Secondary | ICD-10-CM | POA: Diagnosis not present

## 2019-11-29 DIAGNOSIS — E785 Hyperlipidemia, unspecified: Secondary | ICD-10-CM | POA: Diagnosis not present

## 2019-11-29 DIAGNOSIS — R079 Chest pain, unspecified: Secondary | ICD-10-CM | POA: Diagnosis not present

## 2019-11-29 DIAGNOSIS — R0789 Other chest pain: Secondary | ICD-10-CM

## 2019-11-29 DIAGNOSIS — I25110 Atherosclerotic heart disease of native coronary artery with unstable angina pectoris: Secondary | ICD-10-CM | POA: Diagnosis not present

## 2019-11-29 DIAGNOSIS — I10 Essential (primary) hypertension: Secondary | ICD-10-CM | POA: Diagnosis not present

## 2019-11-29 DIAGNOSIS — I2510 Atherosclerotic heart disease of native coronary artery without angina pectoris: Secondary | ICD-10-CM | POA: Diagnosis not present

## 2019-11-29 DIAGNOSIS — I129 Hypertensive chronic kidney disease with stage 1 through stage 4 chronic kidney disease, or unspecified chronic kidney disease: Secondary | ICD-10-CM | POA: Diagnosis not present

## 2019-11-29 LAB — ECHOCARDIOGRAM COMPLETE
AR max vel: 2.22 cm2
AV Area VTI: 2.09 cm2
AV Area mean vel: 2.07 cm2
AV Mean grad: 4.7 mmHg
AV Peak grad: 8.2 mmHg
Ao pk vel: 1.43 m/s
Area-P 1/2: 2.56 cm2
Height: 71 in
S' Lateral: 2.74 cm
Weight: 4000 oz

## 2019-11-29 LAB — TROPONIN I (HIGH SENSITIVITY)
Troponin I (High Sensitivity): 35 ng/L — ABNORMAL HIGH (ref <18)
Troponin I (High Sensitivity): 33 ng/L — ABNORMAL HIGH (ref <18)

## 2019-11-29 LAB — HIV ANTIBODY (ROUTINE TESTING W REFLEX): HIV Screen 4th Generation wRfx: NONREACTIVE

## 2019-11-29 MED ORDER — PERFLUTREN LIPID MICROSPHERE
1.0000 mL | INTRAVENOUS | Status: AC | PRN
  Administered 2019-11-29: 2 mL via INTRAVENOUS

## 2019-11-29 MED ORDER — PANTOPRAZOLE SODIUM 40 MG PO TBEC
40.0000 mg | DELAYED_RELEASE_TABLET | Freq: Every day | ORAL | Status: DC
  Administered 2019-11-29: 40 mg via ORAL
  Filled 2019-11-29: qty 1

## 2019-11-29 NOTE — Progress Notes (Signed)
Patient troponin: 35. On call clinician made aware via Amion and ordered repeat troponin level.

## 2019-11-29 NOTE — Progress Notes (Signed)
*  PRELIMINARY RESULTS* Echocardiogram 2D Echocardiogram has been performed with Definity.  Samuel Germany 11/29/2019, 2:59 PM

## 2019-11-29 NOTE — Consult Note (Addendum)
Cardiology Consultation:   Patient ID: Larry Pham; [id number]; 11/29/1965   Admit date: 11/28/2019 Date of Consult: 11/29/2019  Primary Care Provider: [facility] Primary Cardiologist: Carlyle Dolly, MD 05/27/2019 tele-visit Primary Electrophysiologist:  None   Patient Profile:   Larry Pham is a 54 y.o. male with a hx of CABG 2020 w/ post-op Aflutter, PAD, HTN, HLD, ETOH, GERD, CVA, CKD III, who is being seen today for the evaluation of chest pain at the request of Dr Roderic Palau.  History of Present Illness:   Larry Pham had a tele-visit w/ Dr [name] 05/27/2019. No ischemic sx, ACE restarted, off amio, lipids ok, PAD stable.  Pt came to the ER 07/22 w/ chest pain in the setting of BP 244/122 when he checked it at home.  In the ER, his BP was 156/104. Cards asked to see.   Larry Pham has been sick all week.  He woke up Monday with general malaise.  He had no specific complaints, no dysuria, no cough.  However, he had intermittent fevers and chills and sweats.  He did not check his temperature but felt very bad in general.  He was nauseated but no diarrhea.  He did not eat very much he is clear that he did not miss any of his medications.  He also had watery eyes and a slight headache.  Finally, on Wednesday, he was still feeling bad so he checked his blood pressure.  He got multiple readings with elevated blood pressure, the highest 1 was 244/122.  That is what made him come to the hospital.  Although he was sick for 3 days, he did not get chest pain until yesterday.  After he got the chest pain, he was concerned about it and that is when he started checking his blood pressure.  The chest pain has resolved after coming to the emergency room.   Past Medical History:  Diagnosis Date  . CAD (coronary artery disease)    a. 2013 Inf STEMI s/p RCA DES. b. DES to RCA 2014 at [facility]. c. 04/2015 Cath: LM 40, LAD 40ost, 63m, D1 30, LCX 65m, OM1 50, RCA 30p, 20/74m,  50d. EF 55-65%. d. s/p CABG on 10/17/2018 with LIMA-LAD, SVG-OM, and SVG-PDA.  . Carotid arterial disease ([place])    a. 07/2015 Carotid U/S: <50% bilat.  . Carpal tunnel syndrome   . CKD (chronic kidney disease), stage III   . CVA (cerebral vascular accident) ([place]) 12/09/2014   MRI brain=> remote infarcts seen in pons and left thalamus   . GERD (gastroesophageal reflux disease)   . Hepatic steatosis   . History of ETOH abuse   . Hyperlipidemia   . Hypertension   . MI (myocardial infarction) ([place]) 02-2012  . PAD (peripheral artery disease) ([place])    a. 06/2015 s/p L SFA stenting; b. 12/2016 PV Angio: R SFA 40-63m, L SFA patent stent-->Med Rx, cillostazol added.  . Seasonal allergies     Past Surgical History:  Procedure Laterality Date  . ABDOMINAL AORTOGRAM W/LOWER EXTREMITY N/A 01/04/2017   Procedure: ABDOMINAL AORTOGRAM W/LOWER EXTREMITY;  Surgeon: Wellington Hampshire, MD;  Location: [place] [facility];  Service: Cardiovascular;  Laterality: N/A;  . CARDIAC CATHETERIZATION N/A 04/22/2015   Procedure: Left Heart Cath and Coronary Angiography;  Surgeon: Burnell Blanks, MD;  Location: [place] [facility];  Service: Cardiovascular;  Laterality: N/A;  . CARDIAC CATHETERIZATION N/A 04/22/2015   Procedure: Intravascular Pressure Wire/FFR Study;  Surgeon: Annita Brod  Angelena Form, MD;  Location: [place] [facility];  Service: Cardiovascular;  Laterality: N/A;  . COLONOSCOPY WITH PROPOFOL N/A 03/23/2015   Procedure: COLONOSCOPY WITH PROPOFOL;  Surgeon: Daneil Dolin, MD;  Location: [facility];  Service: Endoscopy;  Laterality: N/A;  [place] time in 0849  time out  0959  total time 10 mintues  . CORONARY ANGIOPLASTY    . CORONARY ARTERY BYPASS GRAFT N/A 10/17/2018   Procedure: CORONARY ARTERY BYPASS GRAFTING (CABG) TIMES THREE USING LEFT INTERNAL MAMMARY ARTERY AND GREATER SAPHENOUS VEIN COLLECTED ENDOSCOPICALLY;  Surgeon: Melrose Nakayama, MD;  Location: [place];  Service: Open Heart Surgery;   Laterality: N/A;  . CORONARY ARTERY CATHETERIZATION WITH STENTING N/A 02-2102 AND 05-2012  . LEFT FOREARM SURGERY     s/p machine accident  . LEFT HEART CATH AND CORONARY ANGIOGRAPHY N/A 10/15/2018   Procedure: LEFT HEART CATH AND CORONARY ANGIOGRAPHY;  Surgeon: Lorretta Harp, MD;  Location: [place] [facility];  Service: Cardiovascular;  Laterality: N/A;  . LOWER EXTREMITY ANGIOGRAM Bilateral 07/01/2015   Procedure: Lower Extremity Angiogram;  Surgeon: Wellington Hampshire, MD;  Location: [place] [facility];  Service: Cardiovascular;  Laterality: Bilateral;  . PERIPHERAL VASCULAR CATHETERIZATION N/A 07/01/2015   Procedure: Abdominal Aortogram;  Surgeon: Wellington Hampshire, MD;  Location: [place] [facility];  Service: Cardiovascular;  Laterality: N/A;  . PERIPHERAL VASCULAR CATHETERIZATION Left 07/01/2015   Procedure: Peripheral Vascular Intervention;  Surgeon: Wellington Hampshire, MD;  Location: [place] [facility];  Service: Cardiovascular;  Laterality: Left;  SFA  . POLYPECTOMY N/A 03/23/2015   Procedure: POLYPECTOMY;  Surgeon: Daneil Dolin, MD;  Location: [facility];  Service: Endoscopy;  Laterality: N/A;  cecal, descending colon  . TEE WITHOUT CARDIOVERSION N/A 10/17/2018   Procedure: TRANSESOPHAGEAL ECHOCARDIOGRAM (TEE);  Surgeon: Melrose Nakayama, MD;  Location: [place];  Service: Open Heart Surgery;  Laterality: N/A;  . TRIGGER FINGER RELEASE     right 5th digit     Prior to Admission medications   Medication Sig Start Date End Date Taking? Authorizing Provider  albuterol (PROVENTIL HFA;VENTOLIN HFA) 108 (90 BASE) MCG/ACT inhaler Inhale 2 puffs into the lungs every 6 (six) hours as needed for wheezing or shortness of breath. 04/13/15  Yes BranchAlphonse Guild, MD  aspirin EC 81 MG tablet Take 81 mg by mouth daily.   Yes [provider]  atorvastatin (LIPITOR) 80 MG tablet Take 1 tablet (80 mg total) by mouth daily. 10/22/18  Yes Gold, Wayne E, PA-C  cilostazol (PLETAL) 100 MG tablet TAKE 1  TABLET BY MOUTH TWICE DAILY Patient taking differently: Take 50 mg by mouth 2 (two) times daily.  10/02/19  Yes Tirth Cothron, Alphonse Guild, MD  ezetimibe (ZETIA) 10 MG tablet TAKE 1 TABLET BY MOUTH EVERY DAY Patient taking differently: Take 10 mg by mouth daily.  04/29/19  Yes Ivin Poot, MD  gabapentin (NEURONTIN) 600 MG tablet TAKE 1 TABLET BY MOUTH THREE TIMES A DAY Patient taking differently: Take 600 mg by mouth 3 (three) times daily.  01/15/19  Yes Gold, Wayne E, PA-C  hydrochlorothiazide (HYDRODIURIL) 25 MG tablet Take 1 tablet (25 mg total) by mouth daily. 10/22/18  Yes Gold, Wayne E, PA-C  lisinopril (ZESTRIL) 10 MG tablet TAKE 1 TABLET BY MOUTH ONCE DAILY. Patient taking differently: Take 20 mg by mouth daily.  09/11/19  Yes BranchAlphonse Guild, MD  metoprolol succinate (TOPROL-XL) 25 MG 24 hr tablet Take 25 mg by mouth in the morning  and at bedtime.  11/08/19  Yes [provider]  pantoprazole (PROTONIX) 20 MG tablet Take 1 tablet (20 mg total) by mouth daily. Patient taking differently: Take 20 mg by mouth once a week.  10/24/18  Yes Gold, Patrick Jupiter E, PA-C  Vitamin D, Ergocalciferol, (DRISDOL) 50000 UNITS CAPS capsule Take 1 capsule by mouth 2 (two) times a week. 02/02/15  Yes [provider]  alfuzosin (UROXATRAL) 10 MG 24 hr tablet Take 10 mg by mouth daily.  Patient not taking: Reported on 11/28/2019 05/08/15   [provider]  metoprolol tartrate (LOPRESSOR) 25 MG tablet Take 1 tablet (25 mg total) by mouth 2 (two) times daily. Patient not taking: Reported on 11/28/2019 10/22/18   John Giovanni, PA-C    Inpatient Medications: Scheduled Meds: . alfuzosin  10 mg Oral Daily  . aspirin EC  81 mg Oral Daily  . atorvastatin  80 mg Oral Daily  . cilostazol  50 mg Oral BID  . enoxaparin (LOVENOX) injection  40 mg Subcutaneous Q24H  . ezetimibe  10 mg Oral Daily  . gabapentin  600 mg Oral TID  . hydrochlorothiazide  25 mg Oral Daily  . lisinopril  20 mg Oral Daily  .  metoprolol succinate  25 mg Oral BID  . pantoprazole  40 mg Oral Daily   Continuous Infusions:  PRN Meds: acetaminophen, hydrALAZINE, ondansetron (ZOFRAN) IV  Allergies:    Allergies  Allergen Reactions  . Imdur [Isosorbide Nitrate] Other (See Comments)    Made CP worse  . Bee Venom Swelling    SWELLING REACTION UNSPECIFIED     Social History:   Social History   Socioeconomic History  . Marital status: Legally Separated    Spouse name: Not on file  . Number of children: Not on file  . Years of education: Not on file  . Highest education level: Not on file  Occupational History  . Not on file  Tobacco Use  . Smoking status: Former Smoker    Packs/day: 1.00    Years: 30.00    Pack years: 30.00    Types: Cigarettes    Start date: 11/22/1977    Quit date: 10/30/2018    Years since quitting: 1.0  . Smokeless tobacco: Never Used  Vaping Use  . Vaping Use: Never used  Substance and Sexual Activity  . Alcohol use: Yes    Alcohol/week: 0.0 standard drinks    Comment: occ  . Drug use: No  . Sexual activity: Yes  Other Topics Concern  . Not on file  Social History Narrative  . Not on file   Social Determinants of Health   Financial Resource Strain:   . Difficulty of Paying Living Expenses:   Food Insecurity:   . Worried About Charity fundraiser in the Last Year:   . Arboriculturist in the Last Year:   Transportation Needs:   . Film/video editor (Medical):   Marland Kitchen Lack of Transportation (Non-Medical):   Physical Activity:   . Days of Exercise per Week:   . Minutes of Exercise per Session:   Stress:   . Feeling of Stress :   Social Connections:   . Frequency of Communication with Friends and Family:   . Frequency of Social Gatherings with Friends and Family:   . Attends Religious Services:   . Active Member of Clubs or Organizations:   . Attends Archivist Meetings:   Marland Kitchen Marital Status:   Intimate Partner Violence:   .  Fear of Current or  Ex-Partner:   . Emotionally Abused:   Marland Kitchen Physically Abused:   . Sexually Abused:     Family History:   Family History  Problem Relation Age of Onset  . Hypertension Mother   . Heart disease Mother   . Cirrhosis Father        DIED AT AGE 35  . Breast cancer Paternal Grandmother        DECEASED  . Colon cancer Neg Hx    Family Status:  Family Status  Relation Name Status  . Mother  Deceased  . Father  Deceased at age 45       CIRRHOSIS  . PGM  (Not Specified)  . Neg Hx  (Not Specified)    ROS:  Please see the history of present illness.  All other ROS reviewed and negative.     Physical Exam/Data:   Vitals:   11/28/19 2003 11/28/19 2048 11/29/19 0029 11/29/19 0625  BP:  (!) 166/99 (!) 161/100 104/68  Pulse:  92 83 81  Resp:  16 16 15   Temp:  98.4 F (36.9 C) 98.2 F (36.8 C) 98.1 F (36.7 C)  TempSrc:  Oral Oral Oral  SpO2: 98% 99% 94% 96%  Weight:      Height:        Intake/Output Summary (Last 24 hours) at 11/29/2019 1049 Last data filed at 11/28/2019 1301 Gross per 24 hour  Intake 500 ml  Output --  Net 500 ml    Last 3 Weights 11/28/2019 06/12/2019 05/27/2019  Weight (lbs) 250 lb 256 lb 260 lb  Weight (kg) 113.399 kg 116.121 kg 117.935 kg     Body mass index is 34.87 kg/m.   General:  Well nourished, well developed, male in no acute distress HEENT: normal Lymph: no adenopathy Neck: JVD -not elevated Endocrine:  No thryomegaly Vascular: No carotid bruits; 4/4 extremity pulses 2+  Cardiac:  normal S1, S2; RRR; no murmur Lungs:  clear bilaterally, no wheezing, rhonchi or rales  Abd: soft, nontender, no hepatomegaly  Ext: no edema Musculoskeletal:  No deformities, BUE and BLE strength normal and equal Skin: warm and dry  Neuro:  CNs 2-12 intact, no focal abnormalities noted Psych:  Normal affect   EKG:  The EKG was personally reviewed and demonstrates: Sinus rhythm, heart rate 80, no acute ischemic changes Telemetry:  Telemetry was personally  reviewed and demonstrates: Sinus rhythm, no significant ectopy   CV studies:   05/2012 Cath at [facility] Left Heart Cardiac Catheterization Results: Coronary arteries: Left main: 40% distal Left anterior descending: 30% ostial, scattered irregs Left circumflex: 40% mid, scattered irregs Right coronary: widely patent proximal stent, 70% mid, 40% at angle, diffuse irregs FFR of left main into LAD: 0.81  PCI Percutaneous coronary intervention performed of the mid RCA lesion which was stented with a 3.0 mm x 32mm Promus DES Results: 70% to normal  INTERVENTIONAL IMPRESSIONS and RECOMMENDATIONS: Successful PCI of the mid RCA lesion Aspirin 81 mg daily indefinitely Ticagrelor (Brilinta) 90 mg PO BID for at least 12 months Risk factor modification, smoking cessation  04/2015 Carotid US IMPRESSION: No hemodynamically significant stenosis or plaque is noted in either cervical carotid artery.  12/2016 LE aniography 1. No significant aortoiliac disease. 2. Right lower extremity: Mild diffuse SFA disease with focal moderate 40-50% stenosis in the midsegment. Three-vessel runoff below the knee. 3. Left lower extremity: Mild diffuse SFA disease with patent stent in the midsegment with mild to moderate in-stent restenosis  in three-vessel runoff below the knee.  Recommendations: I don't see a clear culprit for right calf claudication. Recommend continuing medical therapy and smoking cessation. Cilostazol can be considered.   Echocardiogram: 10/14/2018 IMPRESSIONS  1. The left ventricle has hyperdynamic systolic function, with an ejection fraction of >65%. The cavity size was normal. Left ventricular diastolic parameters were normal. 2. The right ventricle has normal systolic function. The cavity was normal. There is no increase in right ventricular wall thickness. 3. Left atrial size was moderately dilated. 4. Right atrial size was mildly dilated. 5. Moderate thickening of the mitral  valve leaflet. 6. The aortic valve is tricuspid.   Cardiac Catheterization: 10/15/2018  Prox RCA to Mid RCA lesion is 25% stenosed.  Mid RCA-1 lesion is 50% stenosed.  Mid RCA-2 lesion is 25% stenosed.  Dist RCA lesion is 40% stenosed.  Ost LM to Mid LM lesion is 60% stenosed.  IMPRESSION: Mr. Tierce has angiographic progression of his distal left main stenosis now in the 60+ percent range. His RCA stents are widely patent with mild intimal hyperplasia though this does not appear obstructive. He gives a classic description of new onset exertional chest pain which is fairly reproducible and he is inferolateral T wave inversion. His LVEDP was elevated. I believe he will require CABG. The sheath was removed and a TR band was placed on the right wrist to achieve patent hemostasis. The patient did receive hydralazine for hypertension. He left the lab in stable condition. Dr. Meda Coffee was made aware of these results. T CTS was notified.   Laboratory Data:   Chemistry Recent Labs  Lab 11/28/19 1028  NA 140  K 4.1  CL 100  CO2 25  GLUCOSE 96  BUN 20  CREATININE 1.27*  CALCIUM 10.3  GFRNONAA >60  GFRAA >60  ANIONGAP 15    Lab Results  Component Value Date   ALT 40 10/15/2018   AST 28 10/15/2018   ALKPHOS 64 10/15/2018   BILITOT 0.4 10/15/2018   Hematology Recent Labs  Lab 11/28/19 1028  WBC 8.9  RBC 5.44  HGB 16.7  HCT 50.2  MCV 92.3  MCH 30.7  MCHC 33.3  RDW 14.9  PLT 229   Cardiac Enzymes High Sensitivity Troponin:   Recent Labs  Lab 11/28/19 1028 11/28/19 1220 11/28/19 2129 11/28/19 2311 11/29/19 0601  TROPONINIHS 31* 28* 29* 35* 33*      BNPNo results for input(s): BNP, PROBNP in the last 168 hours.  DDimer No results for input(s): DDIMER in the last 168 hours. TSH: No results found for: TSH Lipids: Lab Results  Component Value Date   CHOL 129 11/28/2019   HDL 49 11/28/2019   LDLCALC 60 11/28/2019   TRIG 98 11/28/2019   CHOLHDL 2.6  11/28/2019   HgbA1c: Lab Results  Component Value Date   HGBA1C 5.8 (H) 10/16/2018   Magnesium:  Magnesium  Date Value Ref Range Status  10/18/2018 2.5 (H) 1.7 - 2.4 mg/dL Final    Comment:    Performed at [facility], [place] [address] [place], [place] [address]     Radiology/Studies:  CT Head Wo Contrast  Result Date: 11/28/2019 CLINICAL DATA:  Headache, hypertension EXAM: CT HEAD WITHOUT CONTRAST TECHNIQUE: Contiguous axial images were obtained from the base of the skull through the vertex without intravenous contrast. COMPARISON:  10/14/2018 FINDINGS: Brain: No evidence of acute infarction, hemorrhage, hydrocephalus, extra-axial collection or mass lesion/mass effect. Vascular: Atherosclerotic calcifications involving the large vessels of the skull  base. No unexpected hyperdense vessel. Skull: Normal. Negative for fracture or focal lesion. Sinuses/Orbits: No acute finding. Other: None. IMPRESSION: No acute intracranial findings. Electronically Signed   By: Davina Poke D.O.   On: 11/28/2019 11:05   DG Chest Portable 1 View  Result Date: 11/28/2019 CLINICAL DATA:  Chest pain EXAM: PORTABLE CHEST 1 VIEW COMPARISON:  11/20/2018, 10/14/2018 FINDINGS: Post CABG changes. Normal heart size. Calcified granuloma in the peripheral aspect of the right lung base. No focal airspace consolidation, pleural effusion, or pneumothorax. IMPRESSION: No active disease. Electronically Signed   By: Davina Poke D.O.   On: 11/28/2019 10:58    Assessment and Plan:   1.  Chest pain: -His symptoms occurred after he was ill with nonspecific symptoms for 3 days and in the setting of a very high blood pressure. -His chest pain resolved with improvement in his blood pressure. -ECG is not acute, cardiac enzymes are mildly elevated with no clear trend -An echocardiogram is ordered -Follow-up on the results of that, if normal, MD advise if any further work-up is indicated  2.   Hypertension: -Patient states his blood pressure jumps up upon occasion. -He is taking HCTZ 25 mg a day, lisinopril 20 mg a day and Toprol-XL 25 mg twice daily -Both his systolic and diastolic pressures were elevated on admission, they are much improved now. -Discuss with MD giving the patient as needed hydralazine to take when his blood pressure is elevated -Emphasized the need to be compliant with his medications, although he states he is.  His mother helps in this.  3. Elevated Creatinine: - BUN/Cr 20/1.27 today, that is above his baseline - he is on HCTZ 25 mg qd and lisinopril 20 mg qd - has been on the HCTZ since before CABG - lisinopril was previously 40 mg qd, decreased to 10 mg qd and now at 20 mg qd - with acute illness, pt may be a little dry, he got 500 cc IV bolus - follow  Otherwise, per IM Active Problems:   CAD (coronary artery disease), native coronary artery   Tobacco use   History of alcohol abuse   Hyperlipidemia with target LDL less than 70   Essential hypertension   Chest pain      For questions or updates, please contact [facility] Please consult [email or web address] for contact info under Cardiology/STEMI.   Jonetta Speak, PA-C  11/29/2019 10:49 AM  Attending note Patient seen and discussed with PA Barrett, I agree with her documentation. 54 yo male history of CAD with prior PCIs to RCA. CABG 10/2018 LIMA-LAD, SVG-OM, SVG-PDA. Admitted with chest pain and severe HTN. Reports home SBPs in the 200s, reports compliance with meds. Dull chest pain mid chest with activity, 4/10 in severity. Reslolves with rest.    WBC 8.9 Hgb 16.7 Plt 229 K 4.1 Cr 1.27 BUN 20 LDL 60 hstrop 31-->28-->29-->35-->33 CXR no acute process Echo pending EKG SR, nonspecific chronic lateral ST/T changes  BP's today improved. Nonspecific mild flat trop elevation in setting of significant HTN, no specific ischemic EKG changes. Symptoms have resolved with bp control Likely  symptoms related to signifncant HTN, no plans for ischemic testing at this time. Would be ok for discharge if echo has no significant changes. Will need f/u 2-3 weeks with BMET due to ACEI changes.    Zandra Abts MD

## 2019-11-29 NOTE — Plan of Care (Signed)

## 2019-11-29 NOTE — Discharge Summary (Signed)
Physician Discharge Summary  MERVIN RAMIRES ZSW:[id number] DOB: 20-Sep-1965 DOA: 11/28/2019  PCP: [facility] date: 11/28/2019 Discharge date: 11/29/2019  Admitted From: Home Disposition: Home  Recommendations for Outpatient Follow-up:  1. Follow up with PCP in 1-2 weeks 2. Please obtain BMP/CBC in one week 3. Follow-up with cardiology as an outpatient  Home Health: Equipment/Devices:  Discharge Condition: Stable CODE STATUS: Full code Diet recommendation: Heart healthy  Brief/Interim Summary: 54 year old male with a history of hypertension, coronary artery disease, tobacco use, alcohol use, peripheral arterial disease, presents to the hospital with high blood pressure, headache and some chest discomfort. He was noted to have a blood pressure of 245/122 at home. On arrival to the emergency room, EKG did not show any acute changes and cardiac enzymes were negative. He was seen by cardiology who recommended overnight admission for observation. Enzymes were cycled and were found to be negative. Echocardiogram was also unrevealing. He was continued on his home dose of antihypertensives and blood pressure remained stable throughout hospital course. He did not have any recurrence of symptoms. He is felt stable for discharge home today.  Discharge Diagnoses:  Active Problems:   CAD (coronary artery disease), native coronary artery   Tobacco use   History of alcohol abuse   Hyperlipidemia with target LDL less than 70   Essential hypertension   Chest pain    Discharge Instructions  Discharge Instructions    Diet - low sodium heart healthy   Complete by: As directed    Increase activity slowly   Complete by: As directed      Allergies as of 11/29/2019      Reactions   Imdur [isosorbide Nitrate] Other (See Comments)   Made CP worse   Bee Venom Swelling   SWELLING REACTION UNSPECIFIED       Medication List    STOP taking these medications    metoprolol tartrate 25 MG tablet Commonly known as: LOPRESSOR     TAKE these medications   albuterol 108 (90 Base) MCG/ACT inhaler Commonly known as: VENTOLIN HFA Inhale 2 puffs into the lungs every 6 (six) hours as needed for wheezing or shortness of breath.   alfuzosin 10 MG 24 hr tablet Commonly known as: UROXATRAL Take 10 mg by mouth daily.   aspirin EC 81 MG tablet Take 81 mg by mouth daily.   atorvastatin 80 MG tablet Commonly known as: LIPITOR Take 1 tablet (80 mg total) by mouth daily.   cilostazol 100 MG tablet Commonly known as: PLETAL TAKE 1 TABLET BY MOUTH TWICE DAILY What changed: how much to take   ezetimibe 10 MG tablet Commonly known as: ZETIA TAKE 1 TABLET BY MOUTH EVERY DAY   gabapentin 600 MG tablet Commonly known as: NEURONTIN TAKE 1 TABLET BY MOUTH THREE TIMES A DAY   hydrochlorothiazide 25 MG tablet Commonly known as: HYDRODIURIL Take 1 tablet (25 mg total) by mouth daily.   lisinopril 10 MG tablet Commonly known as: ZESTRIL TAKE 1 TABLET BY MOUTH ONCE DAILY. What changed: how much to take   metoprolol succinate 25 MG 24 hr tablet Commonly known as: TOPROL-XL Take 25 mg by mouth in the morning and at bedtime.   pantoprazole 20 MG tablet Commonly known as: PROTONIX Take 1 tablet (20 mg total) by mouth daily. What changed: when to take this   Vitamin D (Ergocalciferol) 1.25 MG (50000 UNIT) Caps capsule Commonly known as: DRISDOL Take 1 capsule by mouth 2 (two) times  a week.       Follow-up Information    Branch, Alphonse Guild, MD. Schedule an appointment as soon as possible for a visit in 2 week(s).   Specialty: Cardiology Contact information: [place] [address] [phone]              Allergies  Allergen Reactions  . Imdur [Isosorbide Nitrate] Other (See Comments)    Made CP worse  . Bee Venom Swelling    SWELLING REACTION UNSPECIFIED     Consultations:  Cardiology   Procedures/Studies: CT  Head Wo Contrast  Result Date: 11/28/2019 CLINICAL DATA:  Headache, hypertension EXAM: CT HEAD WITHOUT CONTRAST TECHNIQUE: Contiguous axial images were obtained from the base of the skull through the vertex without intravenous contrast. COMPARISON:  10/14/2018 FINDINGS: Brain: No evidence of acute infarction, hemorrhage, hydrocephalus, extra-axial collection or mass lesion/mass effect. Vascular: Atherosclerotic calcifications involving the large vessels of the skull base. No unexpected hyperdense vessel. Skull: Normal. Negative for fracture or focal lesion. Sinuses/Orbits: No acute finding. Other: None. IMPRESSION: No acute intracranial findings. Electronically Signed   By: Davina Poke D.O.   On: 11/28/2019 11:05   DG Chest Portable 1 View  Result Date: 11/28/2019 CLINICAL DATA:  Chest pain EXAM: PORTABLE CHEST 1 VIEW COMPARISON:  11/20/2018, 10/14/2018 FINDINGS: Post CABG changes. Normal heart size. Calcified granuloma in the peripheral aspect of the right lung base. No focal airspace consolidation, pleural effusion, or pneumothorax. IMPRESSION: No active disease. Electronically Signed   By: Davina Poke D.O.   On: 11/28/2019 10:58   ECHOCARDIOGRAM COMPLETE  Result Date: 11/29/2019    ECHOCARDIOGRAM REPORT   Patient Name:   MONTERRIUS CARDOSA Date of Exam: 11/29/2019 Medical Rec #:  [id number]       Height:       71.0 in Accession #:    [id number]      Weight:       250.0 lb Date of Birth:  [birth year]       BSA:          2.318 m Patient Age:    5 years        BP:           104/68 mmHg Patient Gender: M               HR:           81 bpm. Exam Location:  [facility] Procedure: 2D Echo, Cardiac Doppler and Color Doppler Indications:    Chest Pain 786.50/R07.9  History:        Patient has prior history of Echocardiogram examinations, most                 recent 10/14/2018. CAD and Previous Myocardial Infarction, Prior                 CABG, Signs/Symptoms:Chest Pain; Risk Factors:Hypertension,                  Dyslipidemia and Current Smoker. History of alcohol abuse.  Sonographer:    Alvino Chapel RCS Referring Phys: [phone] [facility] Storm Sovine  Sonographer Comments: Subcostal window very Difficult to image no IVC IMPRESSIONS  1. Left ventricular ejection fraction, by estimation, is 60 to 65%. The left ventricle has normal function. The left ventricle has no regional wall motion abnormalities. There is severe left ventricular hypertrophy. Left ventricular diastolic parameters  were normal.  2. Right ventricular systolic function is normal. The right ventricular size is normal.  3. The mitral valve is normal in structure. No evidence of mitral valve regurgitation. No evidence of mitral stenosis.  4. The aortic valve has an indeterminant number of cusps. Aortic valve regurgitation is not visualized. No aortic stenosis is present. FINDINGS  Left Ventricle: Left ventricular ejection fraction, by estimation, is 60 to 65%. The left ventricle has normal function. The left ventricle has no regional wall motion abnormalities. Definity contrast agent was given IV to delineate the left ventricular  endocardial borders. The left ventricular internal cavity size was normal in size. There is severe left ventricular hypertrophy. Left ventricular diastolic parameters were normal. Right Ventricle: The right ventricular size is normal. No increase in right ventricular wall thickness. Right ventricular systolic function is normal. Left Atrium: Left atrial size was normal in size. Right Atrium: Right atrial size was normal in size. Pericardium: There is no evidence of pericardial effusion. Mitral Valve: The mitral valve is normal in structure. No evidence of mitral valve regurgitation. No evidence of mitral valve stenosis. Tricuspid Valve: The tricuspid valve is normal in structure. Tricuspid valve regurgitation is not demonstrated. No evidence of tricuspid stenosis. Aortic Valve: The aortic valve has an indeterminant number of cusps. Aortic  valve regurgitation is not visualized. No aortic stenosis is present. Aortic valve mean gradient measures 4.7 mmHg. Aortic valve peak gradient measures 8.2 mmHg. Aortic valve area, by VTI measures 2.09 cm. Pulmonic Valve: The pulmonic valve was not well visualized. Pulmonic valve regurgitation is not visualized. No evidence of pulmonic stenosis. Aorta: The aortic root is normal in size and structure. Pulmonary Artery: Indeterminant PASP, inadequate TR jet. Venous: The inferior vena cava was not well visualized. IAS/Shunts: The interatrial septum was not well visualized.  LEFT VENTRICLE PLAX 2D LVIDd:         4.00 cm  Diastology LVIDs:         2.74 cm  LV e' lateral:   8.38 cm/s LV PW:         1.56 cm  LV E/e' lateral: 6.5 LV IVS:        1.50 cm  LV e' medial:    7.07 cm/s LVOT diam:     1.90 cm  LV E/e' medial:  7.7 LV SV:         39 LV SV Index:   17 LVOT Area:     2.84 cm  RIGHT VENTRICLE RV S prime:     11.30 cm/s TAPSE (M-mode): 1.6 cm LEFT ATRIUM             Index       RIGHT ATRIUM           Index LA diam:        3.30 cm 1.42 cm/m  RA Area:     16.40 cm LA Vol (A2C):   46.4 ml 20.01 ml/m RA Volume:   43.40 ml  18.72 ml/m LA Vol (A4C):   37.2 ml 16.05 ml/m LA Biplane Vol: 45.4 ml 19.58 ml/m  AORTIC VALVE AV Area (Vmax):    2.22 cm AV Area (Vmean):   2.07 cm AV Area (VTI):     2.09 cm AV Vmax:           143.34 cm/s AV Vmean:          103.134 cm/s AV VTI:            0.189 m AV Peak Grad:      8.2 mmHg AV Mean Grad:      4.7  mmHg LVOT Vmax:         112.00 cm/s LVOT Vmean:        75.200 cm/s LVOT VTI:          0.139 m LVOT/AV VTI ratio: 0.74  AORTA Ao Root diam: 3.30 cm MITRAL VALVE MV Area (PHT): 2.56 cm    SHUNTS MV Decel Time: 296 msec    Systemic VTI:  0.14 m MV E velocity: 54.50 cm/s  Systemic Diam: 1.90 cm MV A velocity: 50.80 cm/s MV E/A ratio:  1.07 Carlyle Dolly MD Electronically signed by Carlyle Dolly MD Signature Date/Time: 11/29/2019/3:44:11 PM    Final        Subjective: No  further chest discomfort or headache. Feeling well.  Discharge Exam: Vitals:   11/29/19 0029 11/29/19 0625 11/29/19 1356 11/29/19 1604  BP: (!) 161/100 104/68 104/68 (!) 96/64  Pulse: 83 81 82 80  Resp: 16 15  20   Temp: 98.2 F (36.8 C) 98.1 F (36.7 C)  98.7 F (37.1 C)  TempSrc: Oral Oral  Oral  SpO2: 94% 96%  99%  Weight:      Height:        General: Pt is alert, awake, not in acute distress Cardiovascular: RRR, S1/S2 +, no rubs, no gallops Respiratory: CTA bilaterally, no wheezing, no rhonchi Abdominal: Soft, NT, ND, bowel sounds + Extremities: no edema, no cyanosis    The results of significant diagnostics from this hospitalization (including imaging, microbiology, ancillary and laboratory) are listed below for reference.     Microbiology: Recent Results (from the past 240 hour(s))  SARS Coronavirus 2 by RT PCR (hospital order, performed in [facility] hospital lab) Nasopharyngeal Nasopharyngeal Swab     Status: None   Collection Time: 11/28/19  1:01 PM   Specimen: Nasopharyngeal Swab  Result Value Ref Range Status   SARS Coronavirus 2 NEGATIVE NEGATIVE Final    Comment: (NOTE) SARS-CoV-2 target nucleic acids are NOT DETECTED.  The SARS-CoV-2 RNA is generally detectable in upper and lower respiratory specimens during the acute phase of infection. The lowest concentration of SARS-CoV-2 viral copies this assay can detect is 250 copies / mL. A negative result does not preclude SARS-CoV-2 infection and should not be used as the sole basis for treatment or other patient management decisions.  A negative result may occur with improper specimen collection / handling, submission of specimen other than nasopharyngeal swab, presence of viral mutation(s) within the areas targeted by this assay, and inadequate number of viral copies (<250 copies / mL). A negative result must be combined with clinical observations, patient history, and epidemiological information.  Fact  Sheet for Patients:   StrictlyIdeas.no  Fact Sheet for Healthcare Providers: BankingDealers.co.za  This test is not yet approved or  cleared by the [place] FDA and has been authorized for detection and/or diagnosis of SARS-CoV-2 by FDA under an Emergency Use Authorization (EUA).  This EUA will remain in effect (meaning this test can be used) for the duration of the COVID-19 declaration under Section 564(b)(1) of the Act, 21 U.S.C. section 360bbb-3(b)(1), unless the authorization is terminated or revoked sooner.  Performed at [facility], [address], [place], [place] [address]      Labs: BNP (last 3 results) No results for input(s): BNP in the last 8760 hours. Basic Metabolic Panel: Recent Labs  Lab 11/28/19 1028  NA 140  K 4.1  CL 100  CO2 25  GLUCOSE 96  BUN 20  CREATININE 1.27*  CALCIUM 10.3  Liver Function Tests: No results for input(s): AST, ALT, ALKPHOS, BILITOT, PROT, ALBUMIN in the last 168 hours. No results for input(s): LIPASE, AMYLASE in the last 168 hours. No results for input(s): AMMONIA in the last 168 hours. CBC: Recent Labs  Lab 11/28/19 1028  WBC 8.9  NEUTROABS 6.5  HGB 16.7  HCT 50.2  MCV 92.3  PLT 229   Cardiac Enzymes: No results for input(s): CKTOTAL, CKMB, CKMBINDEX, TROPONINI in the last 168 hours. BNP: Invalid input(s): POCBNP CBG: No results for input(s): GLUCAP in the last 168 hours. D-Dimer No results for input(s): DDIMER in the last 72 hours. Hgb A1c No results for input(s): HGBA1C in the last 72 hours. Lipid Profile Recent Labs    11/28/19 1220  CHOL 129  HDL 49  LDLCALC 60  TRIG 98  CHOLHDL 2.6   Thyroid function studies No results for input(s): TSH, T4TOTAL, T3FREE, THYROIDAB in the last 72 hours.  Invalid input(s): FREET3 Anemia work up No results for input(s): VITAMINB12, FOLATE, FERRITIN, TIBC, IRON, RETICCTPCT in the last 72 hours. Urinalysis     Component Value Date/Time   COLORURINE YELLOW 10/16/2018 2333   APPEARANCEUR CLEAR 10/16/2018 2333   LABSPEC 1.024 10/16/2018 2333   PHURINE 6.0 10/16/2018 2333   GLUCOSEU NEGATIVE 10/16/2018 2333   HGBUR NEGATIVE 10/16/2018 2333   BILIRUBINUR NEGATIVE 10/16/2018 2333   KETONESUR NEGATIVE 10/16/2018 2333   PROTEINUR NEGATIVE 10/16/2018 2333   NITRITE NEGATIVE 10/16/2018 2333   LEUKOCYTESUR NEGATIVE 10/16/2018 2333   Sepsis Labs Invalid input(s): PROCALCITONIN,  WBC,  LACTICIDVEN Microbiology Recent Results (from the past 240 hour(s))  SARS Coronavirus 2 by RT PCR (hospital order, performed in [place] hospital lab) Nasopharyngeal Nasopharyngeal Swab     Status: None   Collection Time: 11/28/19  1:01 PM   Specimen: Nasopharyngeal Swab  Result Value Ref Range Status   SARS Coronavirus 2 NEGATIVE NEGATIVE Final    Comment: (NOTE) SARS-CoV-2 target nucleic acids are NOT DETECTED.  The SARS-CoV-2 RNA is generally detectable in upper and lower respiratory specimens during the acute phase of infection. The lowest concentration of SARS-CoV-2 viral copies this assay can detect is 250 copies / mL. A negative result does not preclude SARS-CoV-2 infection and should not be used as the sole basis for treatment or other patient management decisions.  A negative result may occur with improper specimen collection / handling, submission of specimen other than nasopharyngeal swab, presence of viral mutation(s) within the areas targeted by this assay, and inadequate number of viral copies (<250 copies / mL). A negative result must be combined with clinical observations, patient history, and epidemiological information.  Fact Sheet for Patients:   StrictlyIdeas.no  Fact Sheet for Healthcare Providers: BankingDealers.co.za  This test is not yet approved or  cleared by the [place] FDA and has been authorized for detection and/or diagnosis  of SARS-CoV-2 by FDA under an Emergency Use Authorization (EUA).  This EUA will remain in effect (meaning this test can be used) for the duration of the COVID-19 declaration under Section 564(b)(1) of the Act, 21 U.S.C. section 360bbb-3(b)(1), unless the authorization is terminated or revoked sooner.  Performed at [facility], [address], [place], [place] [address]      Time coordinating discharge: 39mins  SIGNED:   Kathie Dike, MD  [facility] 11/29/2019, 9:15 PM   If 7PM-7AM, please contact night-coverage [email or web address]

## 2019-12-19 ENCOUNTER — Ambulatory Visit: Payer: Self-pay | Admitting: Cardiology

## 2019-12-19 ENCOUNTER — Other Ambulatory Visit: Payer: Self-pay

## 2019-12-19 ENCOUNTER — Encounter: Payer: Self-pay | Admitting: Cardiology

## 2019-12-19 ENCOUNTER — Ambulatory Visit (INDEPENDENT_AMBULATORY_CARE_PROVIDER_SITE_OTHER): Payer: BC Managed Care – PPO | Admitting: Cardiology

## 2019-12-19 VITALS — BP 106/68 | HR 88 | Ht 71.5 in | Wt 249.0 lb

## 2019-12-19 DIAGNOSIS — I2510 Atherosclerotic heart disease of native coronary artery without angina pectoris: Secondary | ICD-10-CM

## 2019-12-19 DIAGNOSIS — I10 Essential (primary) hypertension: Secondary | ICD-10-CM | POA: Diagnosis not present

## 2019-12-19 NOTE — Progress Notes (Signed)
Clinical Summary Larry Pham is a 54 y.o.male seen today for follow up of the following medical problems. This is a focused post hospital visit on recent issues with chest pain and HTN.   1. CAD  - prior PCI to RCA in [place], [place] in setting of inferior STEMI. Noted distal LM disease of 50% at that time.  - continued to have symptoms, seen at [facility]. [facility] MRI showed small RCA infarct with no current ischemic defects.  - 02/2012 echo 50-55% - Jan 2014 cath at [facility] with PCI to RCA with DES, left main disease described as 40% - Jan 2015 MPI without ischemia - 12/2014 MPI without clear ischemia.Small inferoseptal defect with mild reversibility, attenuation artifact vs small area of ischemia 04/2015 cath with moderate LM disease, patent RCA stents, mod LAD disease, severe stneosis small diastal LCX too small for PCI.  - imdur in the past reportedly made chest pain symptoms worst.    underwent CABG on 10/17/2018 with LIMA-LAD, SVG-OM, and SVG-PDA.Presented with unstable angina. - 10/2018 echo LVEF LVEF >65%   - admit 11/2019 with severe HTN and chest pain - chest pains resolved with bp control, flat troponins not consistent with ACS - needs labs - no recurrent symptoms.     2. HTN  - admit 11/2019 with severe HTN SBPs in the 200s - compliant with meds since discharge. Home bp's 110s/60s      SH: recently retired Has had covid vaccine.    Past Medical History:  Diagnosis Date  . CAD (coronary artery disease)    a. 2013 Inf STEMI s/p RCA DES. b. DES to RCA 2014 at [facility]. c. 04/2015 Cath: LM 40, LAD 40ost, 73m, D1 30, LCX 59m, OM1 50, RCA 30p, 20/82m, 50d. EF 55-65%. d. s/p CABG on 10/17/2018 with LIMA-LAD, SVG-OM, and SVG-PDA.  . Carotid arterial disease ([place])    a. 07/2015 Carotid U/S: <50% bilat.  . Carpal tunnel syndrome   . CKD (chronic kidney disease), stage III   . CVA (cerebral vascular accident) ([place]) 12/09/2014   MRI brain=> remote infarcts seen  in pons and left thalamus   . GERD (gastroesophageal reflux disease)   . Hepatic steatosis   . History of ETOH abuse   . Hyperlipidemia   . Hypertension   . MI (myocardial infarction) ([place]) 02-2012  . PAD (peripheral artery disease) ([place])    a. 06/2015 s/p L SFA stenting; b. 12/2016 PV Angio: R SFA 40-60m, L SFA patent stent-->Med Rx, cillostazol added.  . Seasonal allergies      Allergies  Allergen Reactions  . Imdur [Isosorbide Nitrate] Other (See Comments)    Made CP worse  . Bee Venom Swelling    SWELLING REACTION UNSPECIFIED      Current Outpatient Medications  Medication Sig Dispense Refill  . albuterol (PROVENTIL HFA;VENTOLIN HFA) 108 (90 BASE) MCG/ACT inhaler Inhale 2 puffs into the lungs every 6 (six) hours as needed for wheezing or shortness of breath. 1 Inhaler 2  . alfuzosin (UROXATRAL) 10 MG 24 hr tablet Take 10 mg by mouth daily.  (Patient not taking: Reported on 11/28/2019)  11  . aspirin EC 81 MG tablet Take 81 mg by mouth daily.    Marland Kitchen atorvastatin (LIPITOR) 80 MG tablet Take 1 tablet (80 mg total) by mouth daily. 30 tablet 1  . cilostazol (PLETAL) 100 MG tablet TAKE 1 TABLET BY MOUTH TWICE DAILY (Patient taking differently: Take 50 mg by mouth 2 (two) times daily. ) 60  tablet 11  . ezetimibe (ZETIA) 10 MG tablet TAKE 1 TABLET BY MOUTH EVERY DAY (Patient taking differently: Take 10 mg by mouth daily. ) 30 tablet 1  . gabapentin (NEURONTIN) 600 MG tablet TAKE 1 TABLET BY MOUTH THREE TIMES A DAY (Patient taking differently: Take 600 mg by mouth 3 (three) times daily. ) 90 tablet 1  . hydrochlorothiazide (HYDRODIURIL) 25 MG tablet Take 1 tablet (25 mg total) by mouth daily. 30 tablet 1  . lisinopril (ZESTRIL) 10 MG tablet TAKE 1 TABLET BY MOUTH ONCE DAILY. (Patient taking differently: Take 20 mg by mouth daily. ) 30 tablet 1  . metoprolol succinate (TOPROL-XL) 25 MG 24 hr tablet Take 25 mg by mouth in the morning and at bedtime.     . pantoprazole (PROTONIX) 20 MG tablet  Take 1 tablet (20 mg total) by mouth daily. (Patient taking differently: Take 20 mg by mouth once a week. ) 30 tablet 5  . Vitamin D, Ergocalciferol, (DRISDOL) 50000 UNITS CAPS capsule Take 1 capsule by mouth 2 (two) times a week.  5   No current facility-administered medications for this visit.     Past Surgical History:  Procedure Laterality Date  . ABDOMINAL AORTOGRAM W/LOWER EXTREMITY N/A 01/04/2017   Procedure: ABDOMINAL AORTOGRAM W/LOWER EXTREMITY;  Surgeon: Wellington Hampshire, MD;  Location: [place] [facility];  Service: Cardiovascular;  Laterality: N/A;  . CARDIAC CATHETERIZATION N/A 04/22/2015   Procedure: Left Heart Cath and Coronary Angiography;  Surgeon: Burnell Blanks, MD;  Location: [place] [facility];  Service: Cardiovascular;  Laterality: N/A;  . CARDIAC CATHETERIZATION N/A 04/22/2015   Procedure: Intravascular Pressure Wire/FFR Study;  Surgeon: Burnell Blanks, MD;  Location: [place] [facility];  Service: Cardiovascular;  Laterality: N/A;  . COLONOSCOPY WITH PROPOFOL N/A 03/23/2015   Procedure: COLONOSCOPY WITH PROPOFOL;  Surgeon: Daneil Dolin, MD;  Location: [facility];  Service: Endoscopy;  Laterality: N/A;  [place] time in 0849  time out  0959  total time 10 mintues  . CORONARY ANGIOPLASTY    . CORONARY ARTERY BYPASS GRAFT N/A 10/17/2018   Procedure: CORONARY ARTERY BYPASS GRAFTING (CABG) TIMES THREE USING LEFT INTERNAL MAMMARY ARTERY AND GREATER SAPHENOUS VEIN COLLECTED ENDOSCOPICALLY;  Surgeon: Melrose Nakayama, MD;  Location: [place];  Service: Open Heart Surgery;  Laterality: N/A;  . CORONARY ARTERY CATHETERIZATION WITH STENTING N/A 02-2102 AND 05-2012  . LEFT FOREARM SURGERY     s/p machine accident  . LEFT HEART CATH AND CORONARY ANGIOGRAPHY N/A 10/15/2018   Procedure: LEFT HEART CATH AND CORONARY ANGIOGRAPHY;  Surgeon: Lorretta Harp, MD;  Location: [place] [facility];  Service: Cardiovascular;  Laterality: N/A;  . LOWER EXTREMITY ANGIOGRAM Bilateral  07/01/2015   Procedure: Lower Extremity Angiogram;  Surgeon: Wellington Hampshire, MD;  Location: [place] [facility];  Service: Cardiovascular;  Laterality: Bilateral;  . PERIPHERAL VASCULAR CATHETERIZATION N/A 07/01/2015   Procedure: Abdominal Aortogram;  Surgeon: Wellington Hampshire, MD;  Location: [place] [facility];  Service: Cardiovascular;  Laterality: N/A;  . PERIPHERAL VASCULAR CATHETERIZATION Left 07/01/2015   Procedure: Peripheral Vascular Intervention;  Surgeon: Wellington Hampshire, MD;  Location: [place] [facility];  Service: Cardiovascular;  Laterality: Left;  SFA  . POLYPECTOMY N/A 03/23/2015   Procedure: POLYPECTOMY;  Surgeon: Daneil Dolin, MD;  Location: [facility];  Service: Endoscopy;  Laterality: N/A;  cecal, descending colon  . TEE WITHOUT CARDIOVERSION N/A 10/17/2018   Procedure: TRANSESOPHAGEAL ECHOCARDIOGRAM (TEE);  Surgeon: Modesto Charon  C, MD;  Location: [place];  Service: Open Heart Surgery;  Laterality: N/A;  . TRIGGER FINGER RELEASE     right 5th digit     Allergies  Allergen Reactions  . Imdur [Isosorbide Nitrate] Other (See Comments)    Made CP worse  . Bee Venom Swelling    SWELLING REACTION UNSPECIFIED       Family History  Problem Relation Age of Onset  . Hypertension Mother   . Heart disease Mother   . Cirrhosis Father        DIED AT AGE 67  . Breast cancer Paternal Grandmother        DECEASED  . Colon cancer Neg Hx      Social History Larry Pham reports that he quit smoking about 13 months ago. His smoking use included cigarettes. He started smoking about 42 years ago. He has a 30.00 pack-year smoking history. He has never used smokeless tobacco. Larry Pham reports current alcohol use.   Review of Systems CONSTITUTIONAL: No weight loss, fever, chills, weakness or fatigue.  HEENT: Eyes: No visual loss, blurred vision, double vision or yellow sclerae.No hearing loss, sneezing, congestion, runny nose or sore throat.  SKIN: No rash or itching.   CARDIOVASCULAR: per hpi RESPIRATORY: No shortness of breath, cough or sputum.  GASTROINTESTINAL: No anorexia, nausea, vomiting or diarrhea. No abdominal pain or blood.  GENITOURINARY: No burning on urination, no polyuria NEUROLOGICAL: No headache, dizziness, syncope, paralysis, ataxia, numbness or tingling in the extremities. No change in bowel or bladder control.  MUSCULOSKELETAL: No muscle, back pain, joint pain or stiffness.  LYMPHATICS: No enlarged nodes. No history of splenectomy.  PSYCHIATRIC: No history of depression or anxiety.  ENDOCRINOLOGIC: No reports of sweating, cold or heat intolerance. No polyuria or polydipsia.  Marland Kitchen   Physical Examination Today's Vitals   12/19/19 1351  BP: 106/68  Pulse: 88  SpO2: 96%  Weight: 249 lb (112.9 kg)  Height: 5' 11.5" (1.816 m)   Body mass index is 34.24 kg/m.  Gen: resting comfortably, no acute distress HEENT: no scleral icterus, pupils equal round and reactive, no palptable cervical adenopathy,  CV: RRR, no mrg, no jvd Resp: Clear to auscultation bilaterally GI: abdomen is soft, non-tender, non-distended, normal bowel sounds, no hepatosplenomegaly MSK: extremities are warm, no edema.  Skin: warm, no rash Neuro:  no focal deficits Psych: appropriate affect   Diagnostic Studies 05/2013 MPI Analysis of the raw data showed no increased extracardiac radiotracer uptake. Analysis of the perfusion images showed a small mild fixed mid inferior wall defect. Left ventricular cavity size was normal. Regional wall motion was normal, indicating that the defect was due to soft tissue attenuation artifact. Left ventricular systolic function was calculated to be mildly reduced, EF 42%. However, systolic function appeared to be normal with an approximated EF of 50%.  IMPRESSION: 1. Normal myocardial perfusion imaging. No evidence of ischemia or scar.  2. Left ventricular systolic function was calculated to be mildly reduced, EF 42%.  However, systolic function appeared to be grossly normal with an approximated EF of 50%.   05/2012 Cath at [facility]iac Catheterization Results: Coronary arteries: Left main: 40% distal Left anterior descending: 30% ostial, scattered irregs Left circumflex: 40% mid, scattered irregs Right coronary: widely patent proximal stent, 70% mid, 40% at angle, diffuse irregs FFR of left main into LAD: 0.81  PCI Percutaneous coronary intervention performed of the mid RCA lesion which was stented with a 3.0 mm x 14mm Promus DES  Results: 70% to normal  INTERVENTIONAL IMPRESSIONS and RECOMMENDATIONS: Successful PCI of the mid RCA lesion Aspirin 81 mg daily indefinitely Ticagrelor (Brilinta) 90 mg PO BID for at least 12 months Risk factor modification, smoking cessation  12/2014 Lexiscan MPI  This is a low risk study.  The left ventricular ejection fraction is mildly decreased (45-54%).  Small mild inferoseptal defect with mild reversibility. Differences in attenuation verse small area of ischemia, overall low risk finding.    04/2015 Carotid US IMPRESSION: No hemodynamically significant stenosis or plaque is noted in either cervical carotid artery.  04/2015 Cath  Ost 1st Mrg to 1st Mrg lesion, 50% stenosed.  1. Moderate left main stenosis, not flow limiting by FFR (FFR of 0.93) 2. Patent stents mid RCA with minimal restenosis.  3. Moderate distal RCA stenosis.  4. Moderate ostial LAD stenosis.  5. Severe stenosis small caliber distal AV groove Circumflex beyond the takeoff of the large OM Ellese Julius. (too small for PCI) 6. Normal LV systolic function  Recommendations: He has moderate disease as described above with severe disease in the small caliber distal Circumflex. This vessel is too small for PCI. Continue medical management.    04/2015 Echo Study Conclusions  - Left ventricle: The cavity size was normal. Wall thickness was increased in a pattern of  mild LVH. Systolic function was normal. The estimated ejection fraction was in the range of 50% to 55%. Features are consistent with a pseudonormal left ventricular filling pattern, with concomitant abnormal relaxation and increased filling pressure (grade 2 diastolic dysfunction). Doppler parameters are consistent with high ventricular filling pressure. - Regional wall motion abnormality: Mild hypokinesis of the mid inferoseptal, mid inferior, and mid inferolateral myocardium. - Aortic valve: Mildly calcified annulus. Trileaflet. - Mitral valve: Calcified annulus.  12/2016 LE aniography 1. No significant aortoiliac disease. 2. Right lower extremity: Mild diffuse SFA disease with focal moderate 40-50% stenosis in the midsegment. Three-vessel runoff below the knee. 3. Left lower extremity: Mild diffuse SFA disease with patent stent in the midsegment with mild to moderate in-stent restenosis in three-vessel runoff below the knee.  Recommendations: I don't see a clear culprit for right calf claudication. Recommend continuing medical therapy and smoking cessation. Cilostazol can be considered.   Echocardiogram: 10/14/2018 IMPRESSIONS  1. The left ventricle has hyperdynamic systolic function, with an ejection fraction of >65%. The cavity size was normal. Left ventricular diastolic parameters were normal. 2. The right ventricle has normal systolic function. The cavity was normal. There is no increase in right ventricular wall thickness. 3. Left atrial size was moderately dilated. 4. Right atrial size was mildly dilated. 5. Moderate thickening of the mitral valve leaflet. 6. The aortic valve is tricuspid.   Cardiac Catheterization: 10/15/2018  Prox RCA to Mid RCA lesion is 25% stenosed.  Mid RCA-1 lesion is 50% stenosed.  Mid RCA-2 lesion is 25% stenosed.  Dist RCA lesion is 40% stenosed.  Ost LM to Mid LM lesion is 60% stenosed.  IMPRESSION: Larry Pham  has angiographic progression of his distal left main stenosis now in the 60+ percent range. His RCA stents are widely patent with mild intimal hyperplasia though this does not appear obstructive. He gives a classic description of new onset exertional chest pain which is fairly reproducible and he is inferolateral T wave inversion. His LVEDP was elevated. I believe he will require CABG. The sheath was removed and a TR band was placed on the right wrist to achieve patent hemostasis. The patient did receive hydralazine for  hypertension. He left the lab in stable condition. Dr. Meda Coffee was made aware of these results. T CTS was notified.    Assessment and Plan  1. CAD - recent admit with chest pains in setting of severe HTN, symptosm resovled with bp control without signs of ACS - no current symptoms, continue to monitor.    2. HTN - at goal, continue current meds  F/u 48months    Arnoldo Lenis, M.D.

## 2019-12-19 NOTE — Patient Instructions (Signed)
Medication Instructions:  Your physician recommends that you continue on your current medications as directed. Please refer to the Current Medication list given to you today.  *If you need a refill on your cardiac medications before your next appointment, please call your pharmacy*   Lab Work: None today If you have labs (blood work) drawn today and your tests are completely normal, you will receive your results only by: . MyChart Message (if you have MyChart) OR . A paper copy in the mail If you have any lab test that is abnormal or we need to change your treatment, we will call you to review the results.   Testing/Procedures: None today   Follow-Up: At [facility], you and your health needs are our priority.  As part of our continuing mission to provide you with exceptional heart care, we have created designated Provider Care Teams.  These Care Teams include your primary Cardiologist (physician) and Advanced Practice Providers (APPs -  Physician Assistants and Nurse Practitioners) who all work together to provide you with the care you need, when you need it.  We recommend signing up for the patient portal called "MyChart".  Sign up information is provided on this After Visit Summary.  MyChart is used to connect with patients for Virtual Visits (Telemedicine).  Patients are able to view lab/test results, encounter notes, upcoming appointments, etc.  Non-urgent messages can be sent to your provider as well.   To learn more about what you can do with MyChart, go to [email or web address].    Your next appointment:   6 month(s)  The format for your next appointment:   In Person  Provider:   Jonathan Branch, MD   Other Instructions None       Thank you for choosing [facility] !         

## 2020-02-18 ENCOUNTER — Telehealth: Payer: Self-pay | Admitting: Cardiology

## 2020-02-18 NOTE — Telephone Encounter (Signed)
New message    Forms from [place]  received on 02/18/20 . Completed patient auth attached. Took form to MD Box for completion. [facility] 02/18/20 put in Dr Fayrene Helper

## 2020-03-20 ENCOUNTER — Encounter: Payer: Self-pay | Admitting: Internal Medicine

## 2020-05-08 ENCOUNTER — Other Ambulatory Visit: Payer: Self-pay | Admitting: Cardiology

## 2020-05-12 ENCOUNTER — Telehealth: Payer: Self-pay | Admitting: Cardiology

## 2020-05-12 NOTE — Telephone Encounter (Signed)
Informed patient that we did not have access to his medical records, but provided him with the number to [facility] Medical Records Dept.

## 2020-05-12 NOTE — Telephone Encounter (Signed)
Patient came in inquiring about disability paperwork. His Primary Physician could not fill out paperwork because she's a Nurse Practitioner, The disability help group would like a copy of his medical records from his cardiologist to determine their decision. Could be faxed or emailed....   ATTNPara March  RE: Larry Pham... Be sure to include the last four of his social...  Fax #: [phone] E-mail: [email or web address]

## 2020-06-12 ENCOUNTER — Ambulatory Visit: Payer: BC Managed Care – PPO | Admitting: Cardiology

## 2020-09-04 ENCOUNTER — Other Ambulatory Visit: Payer: Self-pay | Admitting: Cardiology

## 2024-03-25 ENCOUNTER — Emergency Department (HOSPITAL_COMMUNITY)

## 2024-03-25 ENCOUNTER — Encounter (HOSPITAL_COMMUNITY): Payer: Self-pay | Admitting: Emergency Medicine

## 2024-03-25 ENCOUNTER — Other Ambulatory Visit: Payer: Self-pay

## 2024-03-25 ENCOUNTER — Emergency Department (HOSPITAL_COMMUNITY)
Admission: EM | Admit: 2024-03-25 | Discharge: 2024-03-25 | Disposition: A | Discharge status: Home / Self Care | Attending: Emergency Medicine | Admitting: Emergency Medicine

## 2024-03-25 DIAGNOSIS — Z7982 Long term (current) use of aspirin: Secondary | ICD-10-CM | POA: Diagnosis not present

## 2024-03-25 DIAGNOSIS — I129 Hypertensive chronic kidney disease with stage 1 through stage 4 chronic kidney disease, or unspecified chronic kidney disease: Secondary | ICD-10-CM | POA: Insufficient documentation

## 2024-03-25 DIAGNOSIS — Z79899 Other long term (current) drug therapy: Secondary | ICD-10-CM | POA: Diagnosis not present

## 2024-03-25 DIAGNOSIS — N402 Nodular prostate without lower urinary tract symptoms: Secondary | ICD-10-CM | POA: Insufficient documentation

## 2024-03-25 DIAGNOSIS — I2510 Atherosclerotic heart disease of native coronary artery without angina pectoris: Secondary | ICD-10-CM | POA: Diagnosis not present

## 2024-03-25 DIAGNOSIS — R110 Nausea: Secondary | ICD-10-CM | POA: Diagnosis not present

## 2024-03-25 DIAGNOSIS — R630 Anorexia: Secondary | ICD-10-CM | POA: Diagnosis not present

## 2024-03-25 DIAGNOSIS — R634 Abnormal weight loss: Secondary | ICD-10-CM | POA: Insufficient documentation

## 2024-03-25 DIAGNOSIS — R39198 Other difficulties with micturition: Secondary | ICD-10-CM | POA: Diagnosis present

## 2024-03-25 DIAGNOSIS — N189 Chronic kidney disease, unspecified: Secondary | ICD-10-CM | POA: Diagnosis not present

## 2024-03-25 DIAGNOSIS — N3001 Acute cystitis with hematuria: Secondary | ICD-10-CM

## 2024-03-25 DIAGNOSIS — N3000 Acute cystitis without hematuria: Secondary | ICD-10-CM | POA: Insufficient documentation

## 2024-03-25 LAB — CBC WITH DIFFERENTIAL/PLATELET
Abs Immature Granulocytes: 0.03 K/uL (ref 0.00–0.07)
Basophils Absolute: 0 K/uL (ref 0.0–0.1)
Basophils Relative: 0 %
Eosinophils Absolute: 0 K/uL (ref 0.0–0.5)
Eosinophils Relative: 0 %
HCT: 39.7 % (ref 39.0–52.0)
Hemoglobin: 13.9 g/dL (ref 13.0–17.0)
Immature Granulocytes: 0 %
Lymphocytes Relative: 22 %
Lymphs Abs: 1.5 K/uL (ref 0.7–4.0)
MCH: 35.1 pg — ABNORMAL HIGH (ref 26.0–34.0)
MCHC: 35 g/dL (ref 30.0–36.0)
MCV: 100.3 fL — ABNORMAL HIGH (ref 80.0–100.0)
Monocytes Absolute: 0.5 K/uL (ref 0.1–1.0)
Monocytes Relative: 7 %
Neutro Abs: 4.9 K/uL (ref 1.7–7.7)
Neutrophils Relative %: 71 %
Platelets: 167 K/uL (ref 150–400)
RBC: 3.96 MIL/uL — ABNORMAL LOW (ref 4.22–5.81)
RDW: 15.1 % (ref 11.5–15.5)
WBC: 7 K/uL (ref 4.0–10.5)
nRBC: 0 % (ref 0.0–0.2)

## 2024-03-25 LAB — COMPREHENSIVE METABOLIC PANEL WITH GFR
ALT: 79 U/L — ABNORMAL HIGH (ref 0–44)
AST: 116 U/L — ABNORMAL HIGH (ref 15–41)
Albumin: 4.4 g/dL (ref 3.5–5.0)
Alkaline Phosphatase: 118 U/L (ref 38–126)
Anion gap: 15 (ref 5–15)
BUN: 12 mg/dL (ref 6–20)
CO2: 25 mmol/L (ref 22–32)
Calcium: 9.9 mg/dL (ref 8.9–10.3)
Chloride: 97 mmol/L — ABNORMAL LOW (ref 98–111)
Creatinine, Ser: 1.14 mg/dL (ref 0.61–1.24)
GFR, Estimated: >60 mL/min (ref >60)
Glucose, Bld: 87 mg/dL (ref 70–99)
Potassium: 4 mmol/L (ref 3.5–5.1)
Sodium: 137 mmol/L (ref 135–145)
Total Bilirubin: 1.7 mg/dL — ABNORMAL HIGH (ref 0.0–1.2)
Total Protein: 7.6 g/dL (ref 6.5–8.1)

## 2024-03-25 LAB — URINALYSIS, ROUTINE W REFLEX MICROSCOPIC
Bilirubin Urine: NEGATIVE
Glucose, UA: NEGATIVE mg/dL
Hgb urine dipstick: NEGATIVE
Ketones, ur: NEGATIVE mg/dL
Leukocytes,Ua: NEGATIVE
Nitrite: NEGATIVE
Protein, ur: 30 mg/dL — AB
Specific Gravity, Urine: 1.018 (ref 1.005–1.030)
pH: 5 (ref 5.0–8.0)

## 2024-03-25 LAB — LIPASE, BLOOD: Lipase: 27 U/L (ref 11–51)

## 2024-03-25 MED ORDER — MORPHINE SULFATE (PF) 4 MG/ML IV SOLN
4.0000 mg | Freq: Once | INTRAVENOUS | Status: AC
  Administered 2024-03-25: 4 mg via INTRAVENOUS
  Filled 2024-03-25: qty 1

## 2024-03-25 MED ORDER — SULFAMETHOXAZOLE-TRIMETHOPRIM 800-160 MG PO TABS: 1.0000 | ORAL_TABLET | Freq: Two times a day (BID) | ORAL | 0 refills | Status: DC

## 2024-03-25 MED ORDER — ONDANSETRON HCL 4 MG PO TABS: 4.0000 mg | ORAL_TABLET | Freq: Three times a day (TID) | ORAL | 0 refills | Status: AC | PRN

## 2024-03-25 MED ORDER — ONDANSETRON HCL 4 MG/2ML IJ SOLN
4.0000 mg | Freq: Once | INTRAMUSCULAR | Status: AC
  Administered 2024-03-25: 4 mg via INTRAVENOUS
  Filled 2024-03-25: qty 2

## 2024-03-25 MED ORDER — IOHEXOL 300 MG/ML  SOLN
100.0000 mL | Freq: Once | INTRAMUSCULAR | Status: AC | PRN
  Administered 2024-03-25: 100 mL via INTRAVENOUS

## 2024-03-25 MED ORDER — PANTOPRAZOLE SODIUM 40 MG IV SOLR
40.0000 mg | Freq: Once | INTRAVENOUS | Status: AC
  Administered 2024-03-25: 40 mg via INTRAVENOUS
  Filled 2024-03-25: qty 10

## 2024-03-25 MED ORDER — SULFAMETHOXAZOLE-TRIMETHOPRIM 800-160 MG PO TABS
1.0000 | ORAL_TABLET | Freq: Once | ORAL | Status: AC
  Administered 2024-03-25: 1 via ORAL
  Filled 2024-03-25: qty 1

## 2024-03-25 NOTE — ED Triage Notes (Signed)
 Pt presents from home with c/o decreased appetite, nausea, 25 lb weight loss, 1 episode of sweating yesterday with left leg weakness that lasted about 10 mins (1145-1200), feels pressure to groin/rectum when sitting x 2 weeks, dark urine, decreased urinary output

## 2024-03-25 NOTE — ED Provider Triage Note (Signed)
 Emergency Medicine Provider Triage Evaluation Note  Larry Pham , a 58 y.o. male  was evaluated in triage.  Pt complains of at least 1 month history of multiple complaints, predominantly concerned about decreased appetite, nausea, abdominal bloating and a 25 pound weight loss.  He has also had episodes of sweating but no documented fevers.  He also reports feeling pressure in his groin/rectal area stating it feels like I am sitting on a balloon.  He reports decreased urinary output as well.  Was seen by his PCP on November 4 but had to reschedule his follow-up appointment till next month.  Review of Systems  Positive: Weight loss, loss of appetite, nausea, abdominal bloating, diaphoresis, reduced urination, nasal congestion Negative: Chest pain, shortness of breath, diarrhea or constipation.  Physical Exam  BP (!) 150/136 (BP Location: Right Arm)   Pulse 99   Temp 98.5 F (36.9 C) (Oral)   Resp 17   Ht 5' 11.5 (1.816 m)   Wt 101 kg   SpO2 100%   BMI 30.63 kg/m  Gen:   Awake, no distress   Resp:  Normal effort  MSK:   Moves extremities without difficulty  Other:    Medical Decision Making  Medically screening exam initiated at 11:53 AM.  Appropriate orders placed.  Larry Pham was informed that the remainder of the evaluation will be completed by another provider, this initial triage assessment does not replace that evaluation, and the importance of remaining in the ED until their evaluation is complete.     Birdena Clarity, PA-C 03/25/24 1156

## 2024-03-25 NOTE — ED Provider Notes (Signed)
 [place] EMERGENCY DEPARTMENT AT [facility] Provider Note   CSN: [id number] Arrival date & time: 03/25/24  1037     Patient presents with: multiple complaints    Larry Pham is a 58 y.o. male with a history including hypertension, GERD, EtOH abuse stating he drinks 8 EtOH beverages daily, history of CAD with MI, peripheral arterial disease, CVA and history of chronic kidney disease here today secondary to weight loss, persistent nausea and reports a 25 pound weight loss in recent months.  He also states he had an episode of diaphoresis yesterday accompanied with left leg numbness which resolved after approximately 10 minutes.  He has a constant feeling of pressure in his groin and reports darker than normal urine and reduced urinary output.  He has had poor p.o. intake for several months.  He denies chest pain, shortness of breath, abdominal pain but does endorse abdominal distention.  No changes in bowels.  Denies hematuria, back or flank pain.   The history is provided by the patient.       Prior to Admission medications   Medication Sig Start Date End Date Taking? Authorizing Provider  ondansetron  (ZOFRAN ) 4 MG tablet Take 1 tablet (4 mg total) by mouth every 8 (eight) hours as needed for nausea. 03/25/24  Yes Joshu Furukawa, PA-C  sulfamethoxazole -trimethoprim  (BACTRIM  DS) 800-160 MG tablet Take 1 tablet by mouth 2 (two) times daily for 10 days. 03/25/24 04/04/24 Yes Dani Wallner, Mliss, PA-C  albuterol  (PROVENTIL  HFA;VENTOLIN  HFA) 108 (90 BASE) MCG/ACT inhaler Inhale 2 puffs into the lungs every 6 (six) hours as needed for wheezing or shortness of breath. 04/13/15   Alvan Dorn FALCON, MD  alfuzosin  (UROXATRAL ) 10 MG 24 hr tablet Take 1 tablet (10 mg total) by mouth at bedtime. 03/27/24   McKenzie, Belvie CROME, MD  aspirin  EC 81 MG tablet Take 81 mg by mouth daily.    [provider]  atorvastatin  (LIPITOR ) 80 MG tablet Take 1 tablet (80 mg total) by mouth daily. 10/22/18    Gold, Wayne E, PA-C  cilostazol  (PLETAL ) 100 MG tablet TAKE 1 TABLET BY MOUTH TWICE DAILY 09/04/20   Alvan Dorn FALCON, MD  cilostazol  (PLETAL ) 50 MG tablet Take 50 mg by mouth 2 (two) times daily.    [provider]  DULoxetine (CYMBALTA) 30 MG capsule Take 30 mg by mouth 2 (two) times daily. 10/28/19   [provider]  ezetimibe  (ZETIA ) 10 MG tablet TAKE 1 TABLET BY MOUTH EVERY DAY Patient taking differently: Take 10 mg by mouth daily.  04/29/19   Obadiah Coy, MD  gabapentin  (NEURONTIN ) 600 MG tablet TAKE 1 TABLET BY MOUTH THREE TIMES A DAY Patient taking differently: Take 600 mg by mouth 3 (three) times daily.  01/15/19   Gold, Wayne E, PA-C  hydrochlorothiazide  (HYDRODIURIL ) 25 MG tablet Take 1 tablet (25 mg total) by mouth daily. 10/22/18   Gold, Wayne E, PA-C  lisinopril  (ZESTRIL ) 10 MG tablet TAKE 1 TABLET BY MOUTH ONCE DAILY. 09/04/20   Alvan Dorn FALCON, MD  metoprolol  succinate (TOPROL -XL) 25 MG 24 hr tablet Take 25 mg by mouth in the morning and at bedtime.  11/08/19   [provider]  pantoprazole  (PROTONIX ) 20 MG tablet Take 1 tablet (20 mg total) by mouth daily. Patient taking differently: Take 20 mg by mouth once a week.  10/24/18   Gold, Wayne E, PA-C  Vitamin D, Ergocalciferol, (DRISDOL) 50000 UNITS CAPS capsule Take 1 capsule by mouth 2 (two) times a  week. 02/02/15   [provider]    Allergies: Imdur  [isosorbide  nitrate] and Bee venom    Review of Systems  Constitutional:  Positive for appetite change and unexpected weight change. Negative for fever.  HENT:  Negative for congestion and sore throat.   Eyes: Negative.   Respiratory:  Negative for chest tightness and shortness of breath.   Cardiovascular:  Negative for chest pain.  Gastrointestinal:  Positive for abdominal distention and nausea. Negative for abdominal pain and vomiting.  Genitourinary:  Positive for decreased urine volume and difficulty urinating. Negative for flank pain and  hematuria.  Musculoskeletal:  Negative for arthralgias, joint swelling and neck pain.  Skin: Negative.  Negative for rash and wound.  Neurological:  Negative for dizziness, weakness, light-headedness, numbness and headaches.  Psychiatric/Behavioral: Negative.      Updated Vital Signs BP 99/73   Pulse 81   Temp 98.5 F (36.9 C) (Oral)   Resp 16   Ht 5' 11.5 (1.816 m)   Wt 101 kg   SpO2 100%   BMI 30.63 kg/m   Physical Exam Vitals and nursing note reviewed. Exam conducted with a chaperone present.  Constitutional:      Appearance: He is well-developed.  HENT:     Head: Normocephalic and atraumatic.  Eyes:     Conjunctiva/sclera: Conjunctivae normal.  Cardiovascular:     Rate and Rhythm: Normal rate and regular rhythm.     Heart sounds: Normal heart sounds.  Pulmonary:     Effort: Pulmonary effort is normal.     Breath sounds: Normal breath sounds. No wheezing.  Abdominal:     General: Abdomen is protuberant. Bowel sounds are normal.     Palpations: Abdomen is soft. There is no fluid wave, hepatomegaly, splenomegaly or mass.     Tenderness: There is no abdominal tenderness.  Genitourinary:    Prostate: Not tender.     Rectum: No external hemorrhoid or internal hemorrhoid.  Musculoskeletal:        General: Normal range of motion.     Cervical back: Normal range of motion.  Skin:    General: Skin is warm and dry.  Neurological:     Mental Status: He is alert.     (all labs ordered are listed, but only abnormal results are displayed) Labs Reviewed  CBC WITH DIFFERENTIAL/PLATELET - Abnormal; Notable for the following components:      Result Value   RBC 3.96 (*)    MCV 100.3 (*)    MCH 35.1 (*)    All other components within normal limits  COMPREHENSIVE METABOLIC PANEL WITH GFR - Abnormal; Notable for the following components:   Chloride 97 (*)    AST 116 (*)    ALT 79 (*)    Total Bilirubin 1.7 (*)    All other components within normal limits  URINALYSIS,  ROUTINE W REFLEX MICROSCOPIC - Abnormal; Notable for the following components:   Color, Urine AMBER (*)    Protein, ur 30 (*)    Bacteria, UA RARE (*)    All other components within normal limits  LIPASE, BLOOD    EKG: None  Radiology: No results found. Results for orders placed or performed during the hospital encounter of 03/25/24  CBC with Differential   Collection Time: 03/25/24 12:54 PM  Result Value Ref Range   WBC 7.0 4.0 - 10.5 K/uL   RBC 3.96 (L) 4.22 - 5.81 MIL/uL   Hemoglobin 13.9 13.0 - 17.0 g/dL   HCT 39.7  39.0 - 52.0 %   MCV 100.3 (H) 80.0 - 100.0 fL   MCH 35.1 (H) 26.0 - 34.0 pg   MCHC 35.0 30.0 - 36.0 g/dL   RDW 84.8 88.4 - 84.4 %   Platelets 167 150 - 400 K/uL   nRBC 0.0 0.0 - 0.2 %   Neutrophils Relative % 71 %   Neutro Abs 4.9 1.7 - 7.7 K/uL   Lymphocytes Relative 22 %   Lymphs Abs 1.5 0.7 - 4.0 K/uL   Monocytes Relative 7 %   Monocytes Absolute 0.5 0.1 - 1.0 K/uL   Eosinophils Relative 0 %   Eosinophils Absolute 0.0 0.0 - 0.5 K/uL   Basophils Relative 0 %   Basophils Absolute 0.0 0.0 - 0.1 K/uL   Immature Granulocytes 0 %   Abs Immature Granulocytes 0.03 0.00 - 0.07 K/uL  Comprehensive metabolic panel   Collection Time: 03/25/24 12:54 PM  Result Value Ref Range   Sodium 137 135 - 145 mmol/L   Potassium 4.0 3.5 - 5.1 mmol/L   Chloride 97 (L) 98 - 111 mmol/L   CO2 25 22 - 32 mmol/L   Glucose, Bld 87 70 - 99 mg/dL   BUN 12 6 - 20 mg/dL   Creatinine, Ser 8.85 0.61 - 1.24 mg/dL   Calcium  9.9 8.9 - 10.3 mg/dL   Total Protein 7.6 6.5 - 8.1 g/dL   Albumin  4.4 3.5 - 5.0 g/dL   AST 883 (H) 15 - 41 U/L   ALT 79 (H) 0 - 44 U/L   Alkaline Phosphatase 118 38 - 126 U/L   Total Bilirubin 1.7 (H) 0.0 - 1.2 mg/dL   GFR, Estimated >39 >39 mL/min   Anion gap 15 5 - 15  Lipase, blood   Collection Time: 03/25/24 12:54 PM  Result Value Ref Range   Lipase 27 11 - 51 U/L  Urinalysis, Routine w reflex microscopic -Urine, Clean Catch   Collection Time: 03/25/24   1:07 PM  Result Value Ref Range   Color, Urine AMBER (A) YELLOW   APPearance CLEAR CLEAR   Specific Gravity, Urine 1.018 1.005 - 1.030   pH 5.0 5.0 - 8.0   Glucose, UA NEGATIVE NEGATIVE mg/dL   Hgb urine dipstick NEGATIVE NEGATIVE   Bilirubin Urine NEGATIVE NEGATIVE   Ketones, ur NEGATIVE NEGATIVE mg/dL   Protein, ur 30 (A) NEGATIVE mg/dL   Nitrite NEGATIVE NEGATIVE   Leukocytes,Ua NEGATIVE NEGATIVE   RBC / HPF 0-5 0 - 5 RBC/hpf   WBC, UA 6-10 0 - 5 WBC/hpf   Bacteria, UA RARE (A) NONE SEEN   Squamous Epithelial / HPF 0-5 0 - 5 /HPF   Mucus PRESENT    Hyaline Casts, UA PRESENT    CT ABDOMEN PELVIS W CONTRAST Result Date: 03/25/2024 CLINICAL DATA:  Abdominal pain and weight loss. EXAM: CT ABDOMEN AND PELVIS WITH CONTRAST TECHNIQUE: Multidetector CT imaging of the abdomen and pelvis was performed using the standard protocol following bolus administration of intravenous contrast. RADIATION DOSE REDUCTION: This exam was performed according to the departmental dose-optimization program which includes automated exposure control, adjustment of the mA and/or kV according to patient size and/or use of iterative reconstruction technique. CONTRAST:  OMNIPAQUE  IOHEXOL  300 MG/ML  SOLN COMPARISON:  CTA chest abdomen and pelvis 10/14/2018 FINDINGS: Lower chest: There is a calcified granuloma in the right middle lobe. Hepatobiliary: Marked diffuse fatty infiltration of the liver is unchanged. Gallbladder and bile ducts are within normal limits. Pancreas: Unremarkable. No pancreatic ductal  dilatation or surrounding inflammatory changes. Spleen: Normal in size without focal abnormality. Adrenals/Urinary Tract: There is lobulated enhancing nodular density protruding from the prostate into the bladder base measuring 2.7 x 4.3 cm which has increased in size. There is diffuse bladder wall thickening versus normal under distention. There subcentimeter hypodensities in the right kidney which are too small to  characterize, likely cysts. Otherwise, adrenal glands and kidneys are within normal limits. Stomach/Bowel: Stomach is within normal limits. Appendix appears normal. No evidence of bowel wall thickening, distention, or inflammatory changes. There are few scattered colonic diverticula. Vascular/Lymphatic: Aortic atherosclerosis. No enlarged abdominal or pelvic lymph nodes. Reproductive: Prostate gland is diffusely heterogeneous and mildly enlarged with nodular protrusion into the bladder base. Other: No abdominal wall hernia or abnormality. No abdominopelvic ascites. Musculoskeletal: No acute or significant osseous findings. IMPRESSION: 1. No acute localizing process in the abdomen or pelvis. 2. Marked fatty infiltration of the liver. 3. Colonic diverticulosis without evidence for diverticulitis. 4. Enlarged heterogeneous prostate gland with enlarging nodular protrusion into the bladder base. Recommend correlation with PSA. 5. Diffuse bladder wall thickening versus normal under distention. Correlate clinically for cystitis. 6. Aortic atherosclerosis. Aortic Atherosclerosis (ICD10-I70.0). Electronically Signed   By: Greig Pique M.D.   On: 03/25/2024 16:25      Procedures   Medications Ordered in the ED  pantoprazole  (PROTONIX ) injection 40 mg (40 mg Intravenous Given 03/25/24 1426)  ondansetron  (ZOFRAN ) injection 4 mg (4 mg Intravenous Given 03/25/24 1428)  morphine  (PF) 4 MG/ML injection 4 mg (4 mg Intravenous Given 03/25/24 1428)  iohexol  (OMNIPAQUE ) 300 MG/ML solution 100 mL (100 mLs Intravenous Contrast Given 03/25/24 1456)  sulfamethoxazole -trimethoprim  (BACTRIM  DS) 800-160 MG per tablet 1 tablet (1 tablet Oral Given 03/25/24 1815)                                    Medical Decision Making Patient presenting with nausea, reduced appetite and unexplained weight loss.  He also endorses reduced urination and difficulty urinating.  He denies frank hematuria or dysuria.  His primary concern is for  liver pathology given his known alcohol overuse.  Differential diagnosis including hepatitis, cirrhosis, gallbladder disease, pancreatitis, SBO given his abdominal distention, ascites, kidney disease, dehydration electrolyte disturbance, UTI, occult malignancy.  Labs obtained and are significant for elevation in AST and ALT in a pattern consistent with EtOH use.  His WBC count is normal at 7.0, urinalysis is negative except for rare bacteria.  CT abdomen and pelvis were obtained given his nausea and weight loss, revealing for fatty infiltration but no evidence for cirrhosis.  No mass, obstruction or other source of patient's nausea is appreciated.  Diffuse bladder wall thickening with a prostate nodular protrusion into the bladder base which could be the source of his urinary symptoms.  Given bladder wall thickening and urine bacteria will cover him with antibiotics.  He was given Zofran  here and then was able to tolerate p.o. intake.  Patient had seen his PCP recently and is next anticipating urology appointment in several days.  He was encouraged to keep this appointment.  Amount and/or Complexity of Data Reviewed Labs: ordered.    Details: Labs significant for amber-colored urine with rare bacteria, normal lipase, AST 116 ALT 79, normal alk phos at 118 slight elevation is in his bilirubin at 1.7, he has a normal WBC count at 7.0 Radiology: ordered.    Details: CT suggesting acute cystitis, prostate  hypertrophy with nodular protrusion, fatty liver.  Risk Prescription drug management.        Final diagnoses:  Prostate nodule  Acute cystitis without hematuria    ED Discharge Orders          Ordered    sulfamethoxazole -trimethoprim  (BACTRIM  DS) 800-160 MG tablet  2 times daily        03/25/24 1807    ondansetron  (ZOFRAN ) 4 MG tablet  Every 8 hours PRN        03/25/24 1807               Keyari Kleeman, PA-C 03/28/24 [id number]    Melvenia Motto, MD 03/30/24 2208

## 2024-03-25 NOTE — Discharge Instructions (Signed)
 As discussed we are treating you for urinary tract infection with the antibiotics prescribed.  I have also prescribed you some nausea medicine.  You have a mass or a nodule of your prostate gland which needs further evaluation by Dr. Sherrilee who is our local urologist.  Call his office to schedule this appointment.  In the interim I recommend follow-up with your primary MD for any new or worsening symptoms.

## 2024-03-25 NOTE — ED Notes (Signed)
 Patient transported to CT

## 2024-03-27 ENCOUNTER — Encounter: Payer: Self-pay | Admitting: Urology

## 2024-03-27 ENCOUNTER — Ambulatory Visit: Admitting: Urology

## 2024-03-27 VITALS — BP 85/57 | HR 73

## 2024-03-27 DIAGNOSIS — R351 Nocturia: Secondary | ICD-10-CM

## 2024-03-27 DIAGNOSIS — N401 Enlarged prostate with lower urinary tract symptoms: Secondary | ICD-10-CM

## 2024-03-27 DIAGNOSIS — R3916 Straining to void: Secondary | ICD-10-CM

## 2024-03-27 LAB — URINALYSIS, ROUTINE W REFLEX MICROSCOPIC
Glucose, UA: NEGATIVE
Leukocytes,UA: NEGATIVE
Nitrite, UA: POSITIVE — AB
RBC, UA: NEGATIVE
Specific Gravity, UA: 1.01 (ref 1.005–1.030)
Urobilinogen, Ur: 8 mg/dL — ABNORMAL HIGH (ref 0.2–1.0)
pH, UA: 6.5 (ref 5.0–7.5)

## 2024-03-27 LAB — MICROSCOPIC EXAMINATION

## 2024-03-27 LAB — BLADDER SCAN AMB NON-IMAGING: Scan Result: 0

## 2024-03-27 MED ORDER — ALFUZOSIN HCL ER 10 MG PO TB24: 10.0000 mg | ORAL_TABLET | Freq: Every day | ORAL | 11 refills | Status: AC

## 2024-03-27 NOTE — Progress Notes (Signed)
 03/27/2024 10:27 AM   Larry Pham Sep 11, 1965 [id number]  Referring provider: [facility] [address] [place],  [place] [address]  Difficulty urinating    HPI: Larry Pham is a 58yo here for evaluation of BPh and difficulty urinating. 11/17 he presented tot he ER and was diagnosed with prostatitis and started on bactrim. IPSS 20 QOL 6 on no prior BPH medication. Nocturia 3x. He has straining to urinate and a weak stream. He has a feeling of incomplete emptying and urinary hesitancy.    PMH: Past Medical History:  Diagnosis Date   CAD (coronary artery disease)    a. 2013 Inf STEMI s/p RCA DES. b. DES to RCA 2014 at [facility]. c. 04/2015 Cath: LM 40, LAD 40ost, 61m, D1 30, LCX 43m, OM1 50, RCA 30p, 20/54m, 50d. EF 55-65%. d. s/p CABG on 10/17/2018 with LIMA-LAD, SVG-OM, and SVG-PDA.   Carotid arterial disease    a. 07/2015 Carotid U/S: <50% bilat.   Carpal tunnel syndrome    CKD (chronic kidney disease), stage III (HCC)    CVA (cerebral vascular accident) (HCC) 12/09/2014   MRI brain=> remote infarcts seen in pons and left thalamus    GERD (gastroesophageal reflux disease)    Hepatic steatosis    History of ETOH abuse    Hyperlipidemia    Hypertension    MI (myocardial infarction) (HCC) 02-2012   PAD (peripheral artery disease)    a. 06/2015 s/p L SFA stenting; b. 12/2016 PV Angio: R SFA 40-13m, L SFA patent stent-->Med Rx, cillostazol added.   Seasonal allergies     Surgical History: Past Surgical History:  Procedure Laterality Date   ABDOMINAL AORTOGRAM W/LOWER EXTREMITY N/A 01/04/2017   Procedure: ABDOMINAL AORTOGRAM W/LOWER EXTREMITY;  Surgeon: Darron Deatrice LABOR, MD;  Location: [facility] INVASIVE CV LAB;  Service: Cardiovascular;  Laterality: N/A;   CARDIAC CATHETERIZATION N/A 04/22/2015   Procedure: Left Heart Cath and Coronary Angiography;  Surgeon: Lonni JONETTA Cash, MD;  Location: [facility] INVASIVE CV LAB;  Service: Cardiovascular;  Laterality: N/A;    CARDIAC CATHETERIZATION N/A 04/22/2015   Procedure: Intravascular Pressure Wire/FFR Study;  Surgeon: Lonni JONETTA Cash, MD;  Location: [facility] INVASIVE CV LAB;  Service: Cardiovascular;  Laterality: N/A;   COLONOSCOPY WITH PROPOFOL  N/A 03/23/2015   Procedure: COLONOSCOPY WITH PROPOFOL ;  Surgeon: Lamar CHRISTELLA Hollingshead, MD;  Location: [facility] ORS;  Service: Endoscopy;  Laterality: N/A;  Cecum time in 0849  time out  0959  total time 10 mintues   CORONARY ANGIOPLASTY     CORONARY ARTERY BYPASS GRAFT N/A 10/17/2018   Procedure: CORONARY ARTERY BYPASS GRAFTING (CABG) TIMES THREE USING LEFT INTERNAL MAMMARY ARTERY AND GREATER SAPHENOUS VEIN COLLECTED ENDOSCOPICALLY;  Surgeon: Kerrin Elspeth BROCKS, MD;  Location: [facility] OR;  Service: Open Heart Surgery;  Laterality: N/A;   CORONARY ARTERY CATHETERIZATION WITH STENTING N/A 02-2102 AND 05-2012   LEFT FOREARM SURGERY     s/p machine accident   LEFT HEART CATH AND CORONARY ANGIOGRAPHY N/A 10/15/2018   Procedure: LEFT HEART CATH AND CORONARY ANGIOGRAPHY;  Surgeon: Court Dorn PARAS, MD;  Location: [facility] INVASIVE CV LAB;  Service: Cardiovascular;  Laterality: N/A;   LOWER EXTREMITY ANGIOGRAM Bilateral 07/01/2015   Procedure: Lower Extremity Angiogram;  Surgeon: Deatrice LABOR Darron, MD;  Location: [facility] INVASIVE CV LAB;  Service: Cardiovascular;  Laterality: Bilateral;   PERIPHERAL VASCULAR CATHETERIZATION N/A 07/01/2015   Procedure: Abdominal Aortogram;  Surgeon: Deatrice LABOR Darron, MD;  Location: [facility] INVASIVE CV LAB;  Service: Cardiovascular;  Laterality: N/A;   PERIPHERAL VASCULAR CATHETERIZATION Left 07/01/2015   Procedure: Peripheral Vascular Intervention;  Surgeon: Deatrice DELENA Cage, MD;  Location: [facility] INVASIVE CV LAB;  Service: Cardiovascular;  Laterality: Left;  SFA   POLYPECTOMY N/A 03/23/2015   Procedure: POLYPECTOMY;  Surgeon: Lamar CHRISTELLA Hollingshead, MD;  Location: [facility] ORS;  Service: Endoscopy;  Laterality: N/A;  cecal, descending colon   TEE WITHOUT CARDIOVERSION N/A 10/17/2018   Procedure:  TRANSESOPHAGEAL ECHOCARDIOGRAM (TEE);  Surgeon: Kerrin Elspeth BROCKS, MD;  Location: [facility];  Service: Open Heart Surgery;  Laterality: N/A;   TRIGGER FINGER RELEASE     right 5th digit    Home Medications:  Allergies as of 03/27/2024       Reactions   Imdur  [isosorbide  Nitrate] Other (See Comments)   Made CP worse   Bee Venom Swelling   SWELLING REACTION UNSPECIFIED         Medication List        Accurate as of March 27, 2024 10:27 AM. If you have any questions, ask your nurse or doctor.          albuterol  108 (90 Base) MCG/ACT inhaler Commonly known as: VENTOLIN  HFA Inhale 2 puffs into the lungs every 6 (six) hours as needed for wheezing or shortness of breath.   aspirin  EC 81 MG tablet Take 81 mg by mouth daily.   atorvastatin  80 MG tablet Commonly known as: LIPITOR  Take 1 tablet (80 mg total) by mouth daily.   cilostazol  50 MG tablet Commonly known as: PLETAL  Take 50 mg by mouth 2 (two) times daily.   cilostazol  100 MG tablet Commonly known as: PLETAL  TAKE 1 TABLET BY MOUTH TWICE DAILY   DULoxetine 30 MG capsule Commonly known as: CYMBALTA Take 30 mg by mouth 2 (two) times daily.   ezetimibe  10 MG tablet Commonly known as: ZETIA  TAKE 1 TABLET BY MOUTH EVERY DAY   gabapentin  600 MG tablet Commonly known as: NEURONTIN  TAKE 1 TABLET BY MOUTH THREE TIMES A DAY   hydrochlorothiazide  25 MG tablet Commonly known as: HYDRODIURIL  Take 1 tablet (25 mg total) by mouth daily.   lisinopril  10 MG tablet Commonly known as: ZESTRIL  TAKE 1 TABLET BY MOUTH ONCE DAILY.   metoprolol  succinate 25 MG 24 hr tablet Commonly known as: TOPROL -XL Take 25 mg by mouth in the morning and at bedtime.   ondansetron  4 MG tablet Commonly known as: ZOFRAN  Take 1 tablet (4 mg total) by mouth every 8 (eight) hours as needed for nausea.   pantoprazole  20 MG tablet Commonly known as: PROTONIX  Take 1 tablet (20 mg total) by mouth daily. What changed: when to take this    sulfamethoxazole -trimethoprim  800-160 MG tablet Commonly known as: BACTRIM  DS Take 1 tablet by mouth 2 (two) times daily for 10 days.   Vitamin D (Ergocalciferol) 1.25 MG (50000 UNIT) Caps capsule Commonly known as: DRISDOL Take 1 capsule by mouth 2 (two) times a week.        Allergies:  Allergies  Allergen Reactions   Imdur  [Isosorbide  Nitrate] Other (See Comments)    Made CP worse   Bee Venom Swelling    SWELLING REACTION UNSPECIFIED     Family History: Family History  Problem Relation Age of Onset   Hypertension Mother    Heart disease Mother    Cirrhosis Father        DIED AT AGE 21   Breast cancer Paternal Grandmother        DECEASED   Colon cancer  Neg Hx     Social History:  reports that he quit smoking about 5 years ago. His smoking use included cigarettes. He started smoking about 46 years ago. He has a 40.9 pack-year smoking history. He has never used smokeless tobacco. He reports current alcohol use. He reports that he does not use drugs.  ROS: All other review of systems were reviewed and are negative except what is noted above in HPI  Physical Exam: BP (!) 85/57   Pulse 73   Constitutional:  Alert and oriented, No acute distress. HEENT: [place] AT, moist mucus membranes.  Trachea midline, no masses. Cardiovascular: No clubbing, cyanosis, or edema. Respiratory: Normal respiratory effort, no increased work of breathing. GI: Abdomen is soft, nontender, nondistended, no abdominal masses GU: No CVA tenderness.  Lymph: No cervical or inguinal lymphadenopathy. Skin: No rashes, bruises or suspicious lesions. Neurologic: Grossly intact, no focal deficits, moving all 4 extremities. Psychiatric: Normal mood and affect.  Laboratory Data: Lab Results  Component Value Date   WBC 7.0 03/25/2024   HGB 13.9 03/25/2024   HCT 39.7 03/25/2024   MCV 100.3 (H) 03/25/2024   PLT 167 03/25/2024    Lab Results  Component Value Date   CREATININE 1.14 03/25/2024    No  results found for: PSA  No results found for: TESTOSTERONE  Lab Results  Component Value Date   HGBA1C 5.8 (H) 10/16/2018    Urinalysis    Component Value Date/Time   COLORURINE AMBER (A) 03/25/2024 1307   APPEARANCEUR CLEAR 03/25/2024 1307   LABSPEC 1.018 03/25/2024 1307   PHURINE 5.0 03/25/2024 1307   GLUCOSEU NEGATIVE 03/25/2024 1307   HGBUR NEGATIVE 03/25/2024 1307   BILIRUBINUR NEGATIVE 03/25/2024 1307   KETONESUR NEGATIVE 03/25/2024 1307   PROTEINUR 30 (A) 03/25/2024 1307   NITRITE NEGATIVE 03/25/2024 1307   LEUKOCYTESUR NEGATIVE 03/25/2024 1307    Lab Results  Component Value Date   BACTERIA RARE (A) 03/25/2024    Pertinent Imaging:  No results found for this or any previous visit.  No results found for this or any previous visit.  No results found for this or any previous visit.  No results found for this or any previous visit.  No results found for this or any previous visit.  No results found for this or any previous visit.  No results found for this or any previous visit.  No results found for this or any previous visit.   Assessment & Plan:    1. Benign prostatic hyperplasia (BPH) with straining on urination (Primary) We will trial uroxatral  10mg   - Urinalysis, Routine w reflex microscopic - BLADDER SCAN AMB NON-IMAGING  2. Nocturia Uroxatral  10mg    No follow-ups on file.  Belvie Clara, MD  [facility] 

## 2024-03-27 NOTE — Patient Instructions (Signed)

## 2024-03-28 ENCOUNTER — Inpatient Hospital Stay (HOSPITAL_COMMUNITY)
Admission: EM | Admit: 2024-03-28 | Discharge: 2024-03-31 | DRG: 683 | Disposition: A | Discharge status: Home / Self Care | Attending: Internal Medicine | Admitting: Internal Medicine

## 2024-03-28 ENCOUNTER — Encounter (HOSPITAL_COMMUNITY): Payer: Self-pay

## 2024-03-28 ENCOUNTER — Emergency Department (HOSPITAL_COMMUNITY)

## 2024-03-28 ENCOUNTER — Other Ambulatory Visit: Payer: Self-pay

## 2024-03-28 DIAGNOSIS — Z91030 Bee allergy status: Secondary | ICD-10-CM

## 2024-03-28 DIAGNOSIS — Z888 Allergy status to other drugs, medicaments and biological substances status: Secondary | ICD-10-CM

## 2024-03-28 DIAGNOSIS — N183 Chronic kidney disease, stage 3 unspecified: Secondary | ICD-10-CM | POA: Diagnosis present

## 2024-03-28 DIAGNOSIS — N179 Acute kidney failure, unspecified: Principal | ICD-10-CM | POA: Diagnosis present

## 2024-03-28 DIAGNOSIS — I959 Hypotension, unspecified: Secondary | ICD-10-CM | POA: Diagnosis present

## 2024-03-28 DIAGNOSIS — I25119 Atherosclerotic heart disease of native coronary artery with unspecified angina pectoris: Secondary | ICD-10-CM | POA: Diagnosis present

## 2024-03-28 DIAGNOSIS — Z8249 Family history of ischemic heart disease and other diseases of the circulatory system: Secondary | ICD-10-CM

## 2024-03-28 DIAGNOSIS — Z720 Tobacco use: Secondary | ICD-10-CM | POA: Diagnosis present

## 2024-03-28 DIAGNOSIS — K760 Fatty (change of) liver, not elsewhere classified: Secondary | ICD-10-CM | POA: Diagnosis present

## 2024-03-28 DIAGNOSIS — N182 Chronic kidney disease, stage 2 (mild): Secondary | ICD-10-CM | POA: Diagnosis present

## 2024-03-28 DIAGNOSIS — R630 Anorexia: Secondary | ICD-10-CM | POA: Insufficient documentation

## 2024-03-28 DIAGNOSIS — I739 Peripheral vascular disease, unspecified: Secondary | ICD-10-CM | POA: Diagnosis present

## 2024-03-28 DIAGNOSIS — R634 Abnormal weight loss: Secondary | ICD-10-CM | POA: Diagnosis present

## 2024-03-28 DIAGNOSIS — I10 Essential (primary) hypertension: Secondary | ICD-10-CM | POA: Diagnosis not present

## 2024-03-28 DIAGNOSIS — N419 Inflammatory disease of prostate, unspecified: Secondary | ICD-10-CM | POA: Diagnosis present

## 2024-03-28 DIAGNOSIS — E860 Dehydration: Secondary | ICD-10-CM | POA: Diagnosis not present

## 2024-03-28 DIAGNOSIS — Z5329 Procedure and treatment not carried out because of patient's decision for other reasons: Secondary | ICD-10-CM | POA: Diagnosis not present

## 2024-03-28 DIAGNOSIS — N400 Enlarged prostate without lower urinary tract symptoms: Secondary | ICD-10-CM | POA: Diagnosis present

## 2024-03-28 DIAGNOSIS — Z79899 Other long term (current) drug therapy: Secondary | ICD-10-CM

## 2024-03-28 DIAGNOSIS — I129 Hypertensive chronic kidney disease with stage 1 through stage 4 chronic kidney disease, or unspecified chronic kidney disease: Secondary | ICD-10-CM | POA: Diagnosis present

## 2024-03-28 DIAGNOSIS — I252 Old myocardial infarction: Secondary | ICD-10-CM

## 2024-03-28 DIAGNOSIS — Z6830 Body mass index (BMI) 30.0-30.9, adult: Secondary | ICD-10-CM

## 2024-03-28 DIAGNOSIS — Z87891 Personal history of nicotine dependence: Secondary | ICD-10-CM

## 2024-03-28 DIAGNOSIS — N390 Urinary tract infection, site not specified: Secondary | ICD-10-CM | POA: Diagnosis present

## 2024-03-28 DIAGNOSIS — Z8673 Personal history of transient ischemic attack (TIA), and cerebral infarction without residual deficits: Secondary | ICD-10-CM

## 2024-03-28 DIAGNOSIS — Z951 Presence of aortocoronary bypass graft: Secondary | ICD-10-CM

## 2024-03-28 DIAGNOSIS — Z1152 Encounter for screening for COVID-19: Secondary | ICD-10-CM

## 2024-03-28 DIAGNOSIS — E785 Hyperlipidemia, unspecified: Secondary | ICD-10-CM | POA: Diagnosis present

## 2024-03-28 DIAGNOSIS — Z7982 Long term (current) use of aspirin: Secondary | ICD-10-CM

## 2024-03-28 LAB — COMPREHENSIVE METABOLIC PANEL WITH GFR
ALT: 52 U/L — ABNORMAL HIGH (ref 0–44)
AST: 65 U/L — ABNORMAL HIGH (ref 15–41)
Albumin: 3.6 g/dL (ref 3.5–5.0)
Alkaline Phosphatase: 78 U/L (ref 38–126)
Anion gap: 13 (ref 5–15)
BUN: 25 mg/dL — ABNORMAL HIGH (ref 6–20)
CO2: 22 mmol/L (ref 22–32)
Calcium: 9.2 mg/dL (ref 8.9–10.3)
Chloride: 96 mmol/L — ABNORMAL LOW (ref 98–111)
Creatinine, Ser: 3.12 mg/dL — ABNORMAL HIGH (ref 0.61–1.24)
GFR, Estimated: 22 mL/min — ABNORMAL LOW (ref >60)
Glucose, Bld: 91 mg/dL (ref 70–99)
Potassium: 4.1 mmol/L (ref 3.5–5.1)
Sodium: 131 mmol/L — ABNORMAL LOW (ref 135–145)
Total Bilirubin: 1.2 mg/dL (ref 0.0–1.2)
Total Protein: 6.8 g/dL (ref 6.5–8.1)

## 2024-03-28 LAB — URINALYSIS, ROUTINE W REFLEX MICROSCOPIC
Bacteria, UA: NONE SEEN
Glucose, UA: NEGATIVE mg/dL
Hgb urine dipstick: NEGATIVE
Ketones, ur: NEGATIVE mg/dL
Leukocytes,Ua: NEGATIVE
Nitrite: NEGATIVE
Protein, ur: 30 mg/dL — AB
Specific Gravity, Urine: 1.02 (ref 1.005–1.030)
pH: 5 (ref 5.0–8.0)

## 2024-03-28 LAB — TROPONIN I (HIGH SENSITIVITY)
Troponin I (High Sensitivity): 8 ng/L (ref <18)
Troponin I (High Sensitivity): 6 ng/L (ref <18)

## 2024-03-28 LAB — PSA: Prostatic Specific Antigen: 1.86 ng/mL (ref 0.00–4.00)

## 2024-03-28 LAB — CBC WITH DIFFERENTIAL/PLATELET
Abs Immature Granulocytes: 0.03 K/uL (ref 0.00–0.07)
Basophils Absolute: 0 K/uL (ref 0.0–0.1)
Basophils Relative: 1 %
Eosinophils Absolute: 0 K/uL (ref 0.0–0.5)
Eosinophils Relative: 1 %
HCT: 34.7 % — ABNORMAL LOW (ref 39.0–52.0)
Hemoglobin: 12.5 g/dL — ABNORMAL LOW (ref 13.0–17.0)
Immature Granulocytes: 0 %
Lymphocytes Relative: 24 %
Lymphs Abs: 1.8 K/uL (ref 0.7–4.0)
MCH: 35.4 pg — ABNORMAL HIGH (ref 26.0–34.0)
MCHC: 36 g/dL (ref 30.0–36.0)
MCV: 98.3 fL (ref 80.0–100.0)
Monocytes Absolute: 0.6 K/uL (ref 0.1–1.0)
Monocytes Relative: 8 %
Neutro Abs: 5 K/uL (ref 1.7–7.7)
Neutrophils Relative %: 66 %
Platelets: 166 K/uL (ref 150–400)
RBC: 3.53 MIL/uL — ABNORMAL LOW (ref 4.22–5.81)
RDW: 14.9 % (ref 11.5–15.5)
WBC: 7.5 K/uL (ref 4.0–10.5)
nRBC: 0.4 % — ABNORMAL HIGH (ref 0.0–0.2)

## 2024-03-28 LAB — I-STAT CG4 LACTIC ACID, ED
Lactic Acid, Venous: 0.9 mmol/L (ref 0.5–1.9)
Lactic Acid, Venous: 2.2 mmol/L (ref 0.5–1.9)

## 2024-03-28 LAB — ETHANOL: Alcohol, Ethyl (B): <15 mg/dL (ref <15)

## 2024-03-28 LAB — HIV ANTIBODY (ROUTINE TESTING W REFLEX): HIV Screen 4th Generation wRfx: NONREACTIVE

## 2024-03-28 LAB — POC OCCULT BLOOD, ED: Fecal Occult Bld: NEGATIVE

## 2024-03-28 MED ORDER — ATORVASTATIN CALCIUM 80 MG PO TABS
80.0000 mg | ORAL_TABLET | Freq: Every day | ORAL | Status: DC
  Administered 2024-03-29 – 2024-03-31 (×3): 80 mg via ORAL
  Filled 2024-03-28 (×2): qty 1
  Filled 2024-03-28: qty 2

## 2024-03-28 MED ORDER — SODIUM CHLORIDE 0.9 % IV BOLUS
500.0000 mL | Freq: Once | INTRAVENOUS | Status: AC
  Administered 2024-03-28: 500 mL via INTRAVENOUS

## 2024-03-28 MED ORDER — ACETAMINOPHEN 650 MG RE SUPP: 650.0000 mg | Freq: Four times a day (QID) | RECTAL | Status: DC | PRN

## 2024-03-28 MED ORDER — SODIUM CHLORIDE 0.9 % IV BOLUS
1000.0000 mL | Freq: Once | INTRAVENOUS | Status: AC
  Administered 2024-03-28: 1000 mL via INTRAVENOUS

## 2024-03-28 MED ORDER — SODIUM CHLORIDE 0.9% FLUSH
3.0000 mL | Freq: Two times a day (BID) | INTRAVENOUS | Status: DC
  Administered 2024-03-29 – 2024-03-30 (×4): 3 mL via INTRAVENOUS

## 2024-03-28 MED ORDER — POLYETHYLENE GLYCOL 3350 17 G PO PACK: 17.0000 g | PACK | Freq: Every day | ORAL | Status: DC | PRN

## 2024-03-28 MED ORDER — ASPIRIN 81 MG PO TBEC
81.0000 mg | DELAYED_RELEASE_TABLET | Freq: Every day | ORAL | Status: DC
  Administered 2024-03-29 – 2024-03-31 (×3): 81 mg via ORAL
  Filled 2024-03-28 (×3): qty 1

## 2024-03-28 MED ORDER — SULFAMETHOXAZOLE-TRIMETHOPRIM 800-160 MG PO TABS
1.0000 | ORAL_TABLET | Freq: Two times a day (BID) | ORAL | Status: DC
  Administered 2024-03-29 (×3): 1 via ORAL
  Filled 2024-03-28 (×4): qty 1

## 2024-03-28 MED ORDER — ACETAMINOPHEN 325 MG PO TABS: 650.0000 mg | ORAL_TABLET | Freq: Four times a day (QID) | ORAL | Status: DC | PRN

## 2024-03-28 MED ORDER — PANTOPRAZOLE SODIUM 20 MG PO TBEC
20.0000 mg | DELAYED_RELEASE_TABLET | Freq: Every day | ORAL | Status: DC
  Administered 2024-03-29 – 2024-03-31 (×3): 20 mg via ORAL
  Filled 2024-03-28 (×3): qty 1

## 2024-03-28 MED ORDER — EZETIMIBE 10 MG PO TABS
10.0000 mg | ORAL_TABLET | Freq: Every day | ORAL | Status: DC
  Administered 2024-03-29 – 2024-03-31 (×3): 10 mg via ORAL
  Filled 2024-03-28 (×3): qty 1

## 2024-03-28 MED ORDER — SODIUM CHLORIDE 0.9 % IV SOLN: INTRAVENOUS | Status: DC

## 2024-03-28 MED ORDER — ENOXAPARIN SODIUM 30 MG/0.3ML IJ SOSY
30.0000 mg | PREFILLED_SYRINGE | INTRAMUSCULAR | Status: DC
  Administered 2024-03-28: 30 mg via SUBCUTANEOUS
  Filled 2024-03-28: qty 0.3

## 2024-03-28 NOTE — ED Triage Notes (Signed)
 Patient was admitted to [facility] last week weight loss sweating vomitng and weakness.  Patient known ETOH abuse 9 shots a day reports not alcohol since last Saturday.  REports went to urology yesterday and bp was /87/57 and checked it at home today and was low.  Patient reports dizziness and weakness in his legs.

## 2024-03-28 NOTE — H&P (Signed)
 History and Physical   Larry Pham FMW:[id number] DOB: [birth year] DOA: 03/28/2024  PCP: [facility]   Patient coming from: Home  Chief Complaint: Weakness, dizziness  HPI: Larry Pham is a 58 y.o. male with medical history significant of hypertension, hyperlipidemia, CKD 2, CAD status post CABG, PAD, ethanol use presenting with weakness and dizziness.  Patient recently seen in [place] for groin pain decreased p.o. intake and weight loss.  CT showed bladder wall thickening and nodular prostate with protrusion into bladder.  Can patient cover with Bactrim  for prostatitis and followed up with urology in the next day.  Urology prescribed alfuzosin .  Chart review shows patient blood pressure was in the 90s at [facility] visit and in the 80s at urology visit.  Patient has continued to take his blood pressure medications as prescribed and did add a dose of alfluzosin.  Denies fevers, chills, chest pain, shortness of breath, abdominal pain, constipation, diarrhea, nausea, vomiting.  ED Course: Vital signs in the ED notable for initial blood pressure in the 60s systolic, since improved to the 90s-100s systolic after IV fluids.  Lab workup included CMP with sodium 131, chloride 96, BUN 25, creatinine elevated to 3.12 from baseline 1.1, AST stable at 65, ALT stable at 52.  CBC with hemoglobin stable at 12.5.  Troponin negative x 2.  FOBT negative.  PSA normal.  Lactic acid normal.  Ethanol level negative.  Urinalysis with bilirubin and protein only.  Urine culture pending.  Chest x-ray showed no acute dramality.  Patient received 2 L IV fluid in the ED.  Review of Systems: As per HPI otherwise all other systems reviewed and are negative.  Past Medical History:  Diagnosis Date   CAD (coronary artery disease)    a. 2013 Inf STEMI s/p RCA DES. b. DES to RCA 2014 at [facility]. c. 04/2015 Cath: LM 40, LAD 40ost, 35m, D1 30, LCX 12m, OM1 50, RCA 30p, 20/32m, 50d. EF  55-65%. d. s/p CABG on 10/17/2018 with LIMA-LAD, SVG-OM, and SVG-PDA.   Carotid arterial disease    a. 07/2015 Carotid U/S: <50% bilat.   Carpal tunnel syndrome    CKD (chronic kidney disease), stage III (HCC)    CVA (cerebral vascular accident) (HCC) 12/09/2014   MRI brain=> remote infarcts seen in pons and left thalamus    GERD (gastroesophageal reflux disease)    Hepatic steatosis    History of ETOH abuse    Hyperlipidemia    Hypertension    MI (myocardial infarction) (HCC) 02-2012   PAD (peripheral artery disease)    a. 06/2015 s/p L SFA stenting; b. 12/2016 PV Angio: R SFA 40-104m, L SFA patent stent-->Med Rx, cillostazol added.   Seasonal allergies     Past Surgical History:  Procedure Laterality Date   ABDOMINAL AORTOGRAM W/LOWER EXTREMITY N/A 01/04/2017   Procedure: ABDOMINAL AORTOGRAM W/LOWER EXTREMITY;  Surgeon: Darron Deatrice LABOR, MD;  Location: [facility] INVASIVE CV LAB;  Service: Cardiovascular;  Laterality: N/A;   CARDIAC CATHETERIZATION N/A 04/22/2015   Procedure: Left Heart Cath and Coronary Angiography;  Surgeon: Lonni JONETTA Cash, MD;  Location: [facility] INVASIVE CV LAB;  Service: Cardiovascular;  Laterality: N/A;   CARDIAC CATHETERIZATION N/A 04/22/2015   Procedure: Intravascular Pressure Wire/FFR Study;  Surgeon: Lonni JONETTA Cash, MD;  Location: [facility] INVASIVE CV LAB;  Service: Cardiovascular;  Laterality: N/A;   COLONOSCOPY WITH PROPOFOL  N/A 03/23/2015   Procedure: COLONOSCOPY WITH PROPOFOL ;  Surgeon: Lamar CHRISTELLA Hollingshead, MD;  Location: [facility] ORS;  Service: Endoscopy;  Laterality: N/A;  Cecum time in 0849  time out  0959  total time 10 mintues   CORONARY ANGIOPLASTY     CORONARY ARTERY BYPASS GRAFT N/A 10/17/2018   Procedure: CORONARY ARTERY BYPASS GRAFTING (CABG) TIMES THREE USING LEFT INTERNAL MAMMARY ARTERY AND GREATER SAPHENOUS VEIN COLLECTED ENDOSCOPICALLY;  Surgeon: Kerrin Elspeth BROCKS, MD;  Location: [facility] OR;  Service: Open Heart Surgery;  Laterality: N/A;   CORONARY ARTERY  CATHETERIZATION WITH STENTING N/A 02-2102 AND 05-2012   LEFT FOREARM SURGERY     s/p machine accident   LEFT HEART CATH AND CORONARY ANGIOGRAPHY N/A 10/15/2018   Procedure: LEFT HEART CATH AND CORONARY ANGIOGRAPHY;  Surgeon: Court Dorn PARAS, MD;  Location: [facility] INVASIVE CV LAB;  Service: Cardiovascular;  Laterality: N/A;   LOWER EXTREMITY ANGIOGRAM Bilateral 07/01/2015   Procedure: Lower Extremity Angiogram;  Surgeon: Deatrice DELENA Cage, MD;  Location: [facility] INVASIVE CV LAB;  Service: Cardiovascular;  Laterality: Bilateral;   PERIPHERAL VASCULAR CATHETERIZATION N/A 07/01/2015   Procedure: Abdominal Aortogram;  Surgeon: Deatrice DELENA Cage, MD;  Location: [facility] INVASIVE CV LAB;  Service: Cardiovascular;  Laterality: N/A;   PERIPHERAL VASCULAR CATHETERIZATION Left 07/01/2015   Procedure: Peripheral Vascular Intervention;  Surgeon: Deatrice DELENA Cage, MD;  Location: [facility] INVASIVE CV LAB;  Service: Cardiovascular;  Laterality: Left;  SFA   POLYPECTOMY N/A 03/23/2015   Procedure: POLYPECTOMY;  Surgeon: Lamar CHRISTELLA Hollingshead, MD;  Location: [facility];  Service: Endoscopy;  Laterality: N/A;  cecal, descending colon   TEE WITHOUT CARDIOVERSION N/A 10/17/2018   Procedure: TRANSESOPHAGEAL ECHOCARDIOGRAM (TEE);  Surgeon: Kerrin Elspeth BROCKS, MD;  Location[facility];  Service: Open Heart Surgery;  Laterality: N/A;   TRIGGER FINGER RELEASE     right 5th digit    Social History  reports that he quit smoking about 5 years ago. His smoking use included cigarettes. He started smoking about 46 years ago. He has a 40.9 pack-year smoking history. He has never used smokeless tobacco. He reports current alcohol use. He reports that he does not use drugs.  Allergies  Allergen Reactions   Imdur  [Isosorbide  Nitrate] Other (See Comments)    Made CP worse   Bee Venom Swelling    SWELLING REACTION UNSPECIFIED     Family History  Problem Relation Age of Onset   Hypertension Mother    Heart disease Mother    Cirrhosis Father        DIED AT AGE  60-12-25   Breast cancer Paternal Grandmother        DECEASED   Colon cancer Neg Hx   Reviewed on admission  Prior to Admission medications   Medication Sig Start Date End Date Taking? Authorizing Provider  albuterol  (PROVENTIL  HFA;VENTOLIN  HFA) 108 (90 BASE) MCG/ACT inhaler Inhale 2 puffs into the lungs every 6 (six) hours as needed for wheezing or shortness of breath. 04/13/15  Yes BranchDorn FALCON, MD  aspirin  EC 81 MG tablet Take 81 mg by mouth daily.   Yes [provider]  atorvastatin  (LIPITOR ) 80 MG tablet Take 1 tablet (80 mg total) by mouth daily. 10/22/18  Yes Gold, Wayne E, PA-C  cetirizine (ZYRTEC) 10 MG tablet Take 10 mg by mouth daily.   Yes [provider]  ezetimibe  (ZETIA ) 10 MG tablet TAKE 1 TABLET BY MOUTH EVERY DAY Patient taking differently: Take 10 mg by mouth daily. 04/29/19  Yes Obadiah Coy, MD  hydrochlorothiazide  (HYDRODIURIL ) 25 MG tablet Take 1 tablet (25 mg total) by mouth daily. 10/22/18  Yes Gold, Wayne E, PA-C  lisinopril  (ZESTRIL ) 10 MG tablet TAKE 1 TABLET BY MOUTH ONCE DAILY. 09/04/20  Yes BranchDorn FALCON, MD  metoprolol  succinate (TOPROL -XL) 25 MG 24 hr tablet Take 25 mg by mouth in the morning and at bedtime.  11/08/19  Yes [provider]  ondansetron  (ZOFRAN ) 4 MG tablet Take 1 tablet (4 mg total) by mouth every 8 (eight) hours as needed for nausea. 03/25/24  Yes Idol, Julie, PA-C  pantoprazole  (PROTONIX ) 20 MG tablet Take 1 tablet (20 mg total) by mouth daily. 10/24/18  Yes Gold, Wayne E, PA-C  pregabalin (LYRICA) 75 MG capsule Take 75 mg by mouth 2 (two) times daily. 03/26/24  Yes [provider]  sulfamethoxazole -trimethoprim  (BACTRIM  DS) 800-160 MG tablet Take 1 tablet by mouth 2 (two) times daily for 10 days. 03/25/24 04/04/24 Yes Idol, Julie, PA-C  alfuzosin  (UROXATRAL ) 10 MG 24 hr tablet Take 1 tablet (10 mg total) by mouth at bedtime. 03/27/24   McKenzie, Belvie CROME, MD  cilostazol  (PLETAL ) 100 MG tablet TAKE 1 TABLET  BY MOUTH TWICE DAILY Patient not taking: Reported on 03/28/2024 09/04/20   Alvan Dorn FALCON, MD  cilostazol  (PLETAL ) 50 MG tablet Take 50 mg by mouth 2 (two) times daily. Patient not taking: Reported on 03/28/2024    [provider]  DULoxetine (CYMBALTA) 30 MG capsule Take 30 mg by mouth 2 (two) times daily. Patient not taking: Reported on 03/28/2024 10/28/19   [provider]  gabapentin  (NEURONTIN ) 600 MG tablet TAKE 1 TABLET BY MOUTH THREE TIMES A DAY Patient not taking: Reported on 03/28/2024 01/15/19   Gold, Wayne E, PA-C  metFORMIN (GLUCOPHAGE) 500 MG tablet Take 500 mg by mouth every morning. Patient not taking: Reported on 03/28/2024    [provider]  Vitamin D, Ergocalciferol, (DRISDOL) 50000 UNITS CAPS capsule Take 1 capsule by mouth 2 (two) times a week. Patient not taking: Reported on 03/28/2024 02/02/15   [provider]    Physical Exam: Vitals:   03/28/24 1430 03/28/24 1500 03/28/24 1535 03/28/24 1630  BP: (!) 91/56 (!) 88/62 107/68 93/67  Pulse:  63 65 75  Resp: 15 15 17 15   Temp:      TempSrc:      SpO2:  97% 100% 97%  Weight:      Height:        Physical Exam Constitutional:      General: He is not in acute distress.    Appearance: Normal appearance.  HENT:     Head: Normocephalic and atraumatic.     Mouth/Throat:     Mouth: Mucous membranes are moist.     Pharynx: Oropharynx is clear.  Eyes:     Extraocular Movements: Extraocular movements intact.     Pupils: Pupils are equal, round, and reactive to light.  Cardiovascular:     Rate and Rhythm: Normal rate and regular rhythm.     Pulses: Normal pulses.     Heart sounds: Normal heart sounds.  Pulmonary:     Effort: Pulmonary effort is normal. No respiratory distress.     Breath sounds: Normal breath sounds.  Abdominal:     General: Bowel sounds are normal. There is no distension.     Palpations: Abdomen is soft.     Tenderness: There is no abdominal tenderness.   Musculoskeletal:        General: No swelling or deformity.  Skin:    General: Skin is warm and dry.  Neurological:  General: No focal deficit present.     Mental Status: Mental status is at baseline.    Labs on Admission: I have personally reviewed following labs and imaging studies  CBC: Recent Labs  Lab 03/25/24 1254 03/28/24 1217  WBC 7.0 7.5  NEUTROABS 4.9 5.0  HGB 13.9 12.5*  HCT 39.7 34.7*  MCV 100.3* 98.3  PLT 167 166    Basic Metabolic Panel: Recent Labs  Lab 03/25/24 1254 03/28/24 1217  NA 137 131*  K 4.0 4.1  CL 97* 96*  CO2 25 22  GLUCOSE 87 91  BUN 12 25*  CREATININE 1.14 3.12*  CALCIUM  9.9 9.2    GFR: Estimated Creatinine Clearance: 30.7 mL/min (A) (by C-G formula based on SCr of 3.12 mg/dL (H)).  Liver Function Tests: Recent Labs  Lab 03/25/24 1254 03/28/24 1217  AST 116* 65*  ALT 79* 52*  ALKPHOS 118 78  BILITOT 1.7* 1.2  PROT 7.6 6.8  ALBUMIN  4.4 3.6    Urine analysis:    Component Value Date/Time   COLORURINE AMBER (A) 03/28/2024 1335   APPEARANCEUR HAZY (A) 03/28/2024 1335   APPEARANCEUR Clear 03/27/2024 1014   LABSPEC 1.020 03/28/2024 1335   PHURINE 5.0 03/28/2024 1335   GLUCOSEU NEGATIVE 03/28/2024 1335   HGBUR NEGATIVE 03/28/2024 1335   BILIRUBINUR SMALL (A) 03/28/2024 1335   BILIRUBINUR Comment (A) 03/27/2024 1014   KETONESUR NEGATIVE 03/28/2024 1335   PROTEINUR 30 (A) 03/28/2024 1335   NITRITE NEGATIVE 03/28/2024 1335   LEUKOCYTESUR NEGATIVE 03/28/2024 1335    Radiological Exams on Admission: DG Chest Portable 1 View Result Date: 03/28/2024 EXAM: 1 VIEW(S) XRAY OF THE CHEST 03/28/2024 02:34:00 PM COMPARISON: 11/28/2019 status post coronary artery bypass graft. CLINICAL HISTORY: hypotension FINDINGS: LUNGS AND PLEURA: No focal pulmonary opacity. No pleural effusion. No pneumothorax. HEART AND MEDIASTINUM: Status post coronary artery bypass graft. No acute abnormality of the cardiac and mediastinal silhouettes.  BONES AND SOFT TISSUES: No acute osseous abnormality. IMPRESSION: 1. No acute cardiopulmonary process. Electronically signed by: Lynwood Seip MD 03/28/2024 03:06 PM EST RP Workstation: [id number]   EKG: Independently reviewed.  Sinus rhythm at 63 bpm.  Nonspecific T wave changes.  Assessment/Plan Principal Problem:   AKI (acute kidney injury) Active Problems:   Hyperlipidemia with target LDL less than 70   Essential hypertension   Coronary artery disease involving native coronary artery of native heart with angina pectoris   PAD (peripheral artery disease)   CKD (chronic kidney disease), stage II   Hypotension   S/P CABG x 3   AKI Hypotension > Patient presenting with weakness and dizziness.  Found to have worsening> blood pressure for the last several days.  At [facility] 3 days ago BP was in the 90s systolic and at urology visit yesterday BP was in the 80s systolic. > Has continued to take blood pressure medications at home despite low normal blood pressures.  Began to feel weak and dizzy today and found to have blood pressure initially in the 60s systolic in the ED. > Has had decreased p.o. intake recently and found to have creatinine elevated to 3.2 which was new from 3 days ago with baseline of around 1.1. > Received 2 L IV fluid in the ED with improvement in blood pressure. Some continued lows, will give additional bolus. - Monitor in progressive unit overnight given continued low blood pressures and initial hypotension. - Continue with IV fluids 500cc bolus and 125cc/h after. - Hold lisinopril  and hydrochlorothiazide  - Hold metoprolol  -  Hold out fluid Zosyn - Close monitor blood pressure  Hypertension - Holding antihypertensives in the setting of above  Hyperlipidemia - Continue home atorvastatin , Zetia   CAD > Status post CABG - Continue home ASA, atorvastatin , Zetia  - Holding lisinopril  and metoprolol  as above  ?Prostatitis BPH >  recently started on Bactrim for  prostatitis. - Continue Bactrim - Hold alfuzosin  given above  Ethanol use > Typically drinks around 9 shots of liquor a day.  Has not had anything drink for the past 5 days. - Noted  DVT prophylaxis: Lovenox  Code Status:   Full Family Communication:  None on admission  Disposition Plan:   Patient is from:  Home  Anticipated DC to:  Home  Anticipated DC date:  1 to 2 days  Anticipated DC barriers: None  Consults called:  None Admission status:  Observation, progressive  Severity of Illness: The appropriate patient status for this patient is OBSERVATION. Observation status is judged to be reasonable and necessary in order to provide the required intensity of service to ensure the patient's safety. The patient's presenting symptoms, physical exam findings, and initial radiographic and laboratory data in the context of their medical condition is felt to place them at decreased risk for further clinical deterioration. Furthermore, it is anticipated that the patient will be medically stable for discharge from the hospital within 2 midnights of admission.    Marsa KATHEE Scurry MD [facility]  How to contact the [facility] Attending or Consulting provider 7A - 7P or covering provider during after hours 7P -7A, for this patient?   Check the care team in [facility] and look for a) attending/consulting [facility] provider listed and b) the [facility] team listed Log into [email or web address] and use [facility]'s universal password to access. If you do not have the password, please contact the hospital operator. Locate the [facility] provider you are looking for under [facility] and page to a number that you can be directly reached. If you still have difficulty reaching the provider, please page the [facility] (Director on Call) for the Hospitalists listed on amion for assistance.  03/28/2024, 4:56 PM

## 2024-03-28 NOTE — ED Triage Notes (Addendum)
 Patients most recent BP is 60/44. EPD notified Patient stating he feels dizzy and light headed.

## 2024-03-28 NOTE — ED Provider Notes (Signed)
 [place] EMERGENCY DEPARTMENT AT [facility] Provider Note   CSN: [id number] Arrival date & time: 03/28/24  1129     Patient presents with: Hypotension   Larry Pham is a 58 y.o. male.   Pt is a 58 yo male with pmhx significant for htn, gerd, CAD, PAD, CKD, and HTN.  Pt was seen at [facility] on 11/17 for rectal pain.  He had a work up which showed possible cystitis as well as an enlarged prostate.  He was given a rx for Bactrim and Zofran .  He did f/u with urology yesterday and was given a rx for alfuzosin  to try and help with prostate sx.  However, his BP was 85/57 in the office prior to starting medication.  He did take a dose today as well as his usual meds.  He comes to the ED today because he feels dizzy and weak.  BP in the 60s upon arrival.       Prior to Admission medications   Medication Sig Start Date End Date Taking? Authorizing Provider  albuterol  (PROVENTIL  HFA;VENTOLIN  HFA) 108 (90 BASE) MCG/ACT inhaler Inhale 2 puffs into the lungs every 6 (six) hours as needed for wheezing or shortness of breath. 04/13/15  Yes BranchDorn FALCON, MD  aspirin  EC 81 MG tablet Take 81 mg by mouth daily.   Yes [provider]  atorvastatin  (LIPITOR ) 80 MG tablet Take 1 tablet (80 mg total) by mouth daily. 10/22/18  Yes Gold, Wayne E, PA-C  cetirizine (ZYRTEC) 10 MG tablet Take 10 mg by mouth daily.   Yes [provider]  ezetimibe  (ZETIA ) 10 MG tablet TAKE 1 TABLET BY MOUTH EVERY DAY Patient taking differently: Take 10 mg by mouth daily. 04/29/19  Yes Obadiah Coy, MD  hydrochlorothiazide  (HYDRODIURIL ) 25 MG tablet Take 1 tablet (25 mg total) by mouth daily. 10/22/18  Yes Gold, Wayne E, PA-C  lisinopril  (ZESTRIL ) 10 MG tablet TAKE 1 TABLET BY MOUTH ONCE DAILY. 09/04/20  Yes BranchDorn FALCON, MD  metoprolol  succinate (TOPROL -XL) 25 MG 24 hr tablet Take 25 mg by mouth in the morning and at bedtime.  11/08/19  Yes [provider]  ondansetron  (ZOFRAN ) 4 MG  tablet Take 1 tablet (4 mg total) by mouth every 8 (eight) hours as needed for nausea. 03/25/24  Yes Idol, Murry Khiev, PA-C  pantoprazole  (PROTONIX ) 20 MG tablet Take 1 tablet (20 mg total) by mouth daily. 10/24/18  Yes Gold, Wayne E, PA-C  pregabalin (LYRICA) 75 MG capsule Take 75 mg by mouth 2 (two) times daily. 03/26/24  Yes [provider]  sulfamethoxazole-trimethoprim (BACTRIM DS) 800-160 MG tablet Take 1 tablet by mouth 2 (two) times daily for 10 days. 03/25/24 04/04/24 Yes Idol, Mliss, PA-C  alfuzosin  (UROXATRAL ) 10 MG 24 hr tablet Take 1 tablet (10 mg total) by mouth at bedtime. 03/27/24   McKenzie, Belvie CROME, MD  cilostazol  (PLETAL ) 100 MG tablet TAKE 1 TABLET BY MOUTH TWICE DAILY Patient not taking: Reported on 03/28/2024 09/04/20   Alvan Dorn FALCON, MD  cilostazol  (PLETAL ) 50 MG tablet Take 50 mg by mouth 2 (two) times daily. Patient not taking: Reported on 03/28/2024    [provider]  DULoxetine (CYMBALTA) 30 MG capsule Take 30 mg by mouth 2 (two) times daily. Patient not taking: Reported on 03/28/2024 10/28/19   [provider]  gabapentin  (NEURONTIN ) 600 MG tablet TAKE 1 TABLET BY MOUTH THREE TIMES A DAY Patient not taking: Reported on 03/28/2024 01/15/19   Gold,  Wayne E, PA-C  metFORMIN (GLUCOPHAGE) 500 MG tablet Take 500 mg by mouth every morning. Patient not taking: Reported on 03/28/2024    [provider]  Vitamin D, Ergocalciferol, (DRISDOL) 50000 UNITS CAPS capsule Take 1 capsule by mouth 2 (two) times a week. Patient not taking: Reported on 03/28/2024 02/02/15   [provider]    Allergies: Imdur  [isosorbide  nitrate] and Bee venom    Review of Systems  Neurological:  Positive for dizziness.  All other systems reviewed and are negative.   Updated Vital Signs BP 107/68   Pulse 65   Temp 97.8 F (36.6 C) (Oral)   Resp 17   Ht 5' 11.5 (1.816 m)   Wt 101 kg   SpO2 100%   BMI 30.62 kg/m   Physical Exam Vitals and nursing  note reviewed.  Constitutional:      Appearance: Normal appearance.  HENT:     Head: Normocephalic and atraumatic.     Right Ear: External ear normal.     Left Ear: External ear normal.     Nose: Nose normal.     Mouth/Throat:     Mouth: Mucous membranes are moist.     Pharynx: Oropharynx is clear.  Eyes:     Extraocular Movements: Extraocular movements intact.     Conjunctiva/sclera: Conjunctivae normal.     Pupils: Pupils are equal, round, and reactive to light.  Cardiovascular:     Rate and Rhythm: Regular rhythm. Tachycardia present.     Pulses: Normal pulses.     Heart sounds: Normal heart sounds.  Pulmonary:     Effort: Pulmonary effort is normal.     Breath sounds: Normal breath sounds.  Abdominal:     General: Abdomen is flat. Bowel sounds are normal.     Palpations: Abdomen is soft.  Genitourinary:    Rectum: Guaiac result negative.  Musculoskeletal:        General: Normal range of motion.     Cervical back: Normal range of motion and neck supple.  Skin:    General: Skin is warm.     Capillary Refill: Capillary refill takes less than 2 seconds.  Neurological:     General: No focal deficit present.     Mental Status: He is alert and oriented to person, place, and time.  Psychiatric:        Mood and Affect: Mood normal.        Behavior: Behavior normal.     (all labs ordered are listed, but only abnormal results are displayed) Labs Reviewed  CBC WITH DIFFERENTIAL/PLATELET - Abnormal; Notable for the following components:      Result Value   RBC 3.53 (*)    Hemoglobin 12.5 (*)    HCT 34.7 (*)    MCH 35.4 (*)    nRBC 0.4 (*)    All other components within normal limits  COMPREHENSIVE METABOLIC PANEL WITH GFR - Abnormal; Notable for the following components:   Sodium 131 (*)    Chloride 96 (*)    BUN 25 (*)    Creatinine, Ser 3.12 (*)    AST 65 (*)    ALT 52 (*)    GFR, Estimated 22 (*)    All other components within normal limits  URINALYSIS, ROUTINE  W REFLEX MICROSCOPIC - Abnormal; Notable for the following components:   Color, Urine AMBER (*)    APPearance HAZY (*)    Bilirubin Urine SMALL (*)    Protein, ur 30 (*)  All other components within normal limits  CULTURE, BLOOD (ROUTINE X 2)  CULTURE, BLOOD (ROUTINE X 2)  URINE CULTURE  ETHANOL  PSA  POC OCCULT BLOOD, ED  I-STAT CG4 LACTIC ACID, ED  I-STAT CG4 LACTIC ACID, ED  TROPONIN I (HIGH SENSITIVITY)  TROPONIN I (HIGH SENSITIVITY)    EKG: EKG Interpretation Date/Time:  Thursday March 28 2024 13:10:01 EST Ventricular Rate:  63 PR Interval:  169 QRS Duration:  106 QT Interval:  424 QTC Calculation: 434 R Axis:   64  Text Interpretation: Sinus rhythm No significant change since last tracing Confirmed by Dean Clarity [phone]) on 03/28/2024 1:53:13 PM  Radiology: DG Chest Portable 1 View Result Date: 03/28/2024 EXAM: 1 VIEW(S) XRAY OF THE CHEST 03/28/2024 02:34:00 PM COMPARISON: 11/28/2019 status post coronary artery bypass graft. CLINICAL HISTORY: hypotension FINDINGS: LUNGS AND PLEURA: No focal pulmonary opacity. No pleural effusion. No pneumothorax. HEART AND MEDIASTINUM: Status post coronary artery bypass graft. No acute abnormality of the cardiac and mediastinal silhouettes. BONES AND SOFT TISSUES: No acute osseous abnormality. IMPRESSION: 1. No acute cardiopulmonary process. Electronically signed by: Lynwood Seip MD 03/28/2024 03:06 PM EST RP Workstation: [id number]     Procedures   Medications Ordered in the ED  sodium chloride  0.9 % bolus 1,000 mL (0 mLs Intravenous Stopped 03/28/24 1427)  sodium chloride  0.9 % bolus 1,000 mL (0 mLs Intravenous Stopped 03/28/24 1643)                                    Medical Decision Making Amount and/or Complexity of Data Reviewed Labs: ordered. Radiology: ordered.  Risk Decision regarding hospitalization.   This patient presents to the ED for concern of dizziness, this involves an extensive number of  treatment options, and is a complaint that carries with it a high risk of complications and morbidity.  The differential diagnosis includes hypotension, sepsis, polypharmacy   Co morbidities that complicate the patient evaluation  htn, gerd, CAD, PAD, CKD, and HTN   Additional history obtained:  Additional history obtained from epic chart eview    Lab Tests:  I Ordered, and personally interpreted labs.  The pertinent results include:  cbc with hgb low at 12.5 (13.9 on 11/17); cmp with bun 25 and cr 3.12 (bun 12 and cr 1.14 on 11/17) and ast 65 and alt 52; lactic nl;ua + bilirubin   Imaging Studies ordered:  I ordered imaging studies including cxr  I independently visualized and interpreted imaging which showed No acute cardiopulmonary process.  I agree with the radiologist interpretation   Cardiac Monitoring:  The patient was maintained on a cardiac monitor.  I personally viewed and interpreted the cardiac monitored which showed an underlying rhythm of: nsr   Medicines ordered and prescription drug management:  I ordered medication including ivfs  for sx  Reevaluation of the patient after these medicines showed that the patient improved I have reviewed the patients home medicines and have made adjustments as needed   Test Considered:  ct   Critical Interventions:  ivfs   Consultations Obtained:  I requested consultation with the hospitalist (Dr. Seena),  and discussed lab and imaging findings as well as pertinent plan - he will admit.   Problem List / ED Course:  Hypotension:  likely multifactorial.  BP improving with IVFs.  BP in the 90s on 11/17, in the 80s yesterday.  I see no instructions to hold BP meds.  Pt does have a new AKI which is likely due to volume depletion.  His hgb has trended downwards, but guaiac is neg.   Reevaluation:  After the interventions noted above, I reevaluated the patient and found that they have :improved   Social  Determinants of Health:  Lives at home   Dispostion:  After consideration of the diagnostic results and the patients response to treatment, I feel that the patent would benefit from admission.    CRITICAL CARE Performed by: Mliss Boyers   Total critical care time: 30 minutes  Critical care time was exclusive of separately billable procedures and treating other patients.  Critical care was necessary to treat or prevent imminent or life-threatening deterioration.  Critical care was time spent personally by me on the following activities: development of treatment plan with patient and/or surrogate as well as nursing, discussions with consultants, evaluation of patient's response to treatment, examination of patient, obtaining history from patient or surrogate, ordering and performing treatments and interventions, ordering and review of laboratory studies, ordering and review of radiographic studies, pulse oximetry and re-evaluation of patient's condition.      Final diagnoses:  AKI (acute kidney injury)  Dehydration  Hypotension, unspecified hypotension type    ED Discharge Orders     None          Boyers Mliss, MD 03/28/24 1650

## 2024-03-28 NOTE — ED Notes (Signed)
 Pt. Stated, they gave me some new medicine in [place] and I think that's making my BP drop, I think its too strong.

## 2024-03-29 DIAGNOSIS — Z8673 Personal history of transient ischemic attack (TIA), and cerebral infarction without residual deficits: Secondary | ICD-10-CM | POA: Diagnosis not present

## 2024-03-29 DIAGNOSIS — I25119 Atherosclerotic heart disease of native coronary artery with unspecified angina pectoris: Secondary | ICD-10-CM | POA: Diagnosis present

## 2024-03-29 DIAGNOSIS — R630 Anorexia: Secondary | ICD-10-CM | POA: Insufficient documentation

## 2024-03-29 DIAGNOSIS — N400 Enlarged prostate without lower urinary tract symptoms: Secondary | ICD-10-CM | POA: Diagnosis present

## 2024-03-29 DIAGNOSIS — Z79899 Other long term (current) drug therapy: Secondary | ICD-10-CM | POA: Diagnosis not present

## 2024-03-29 DIAGNOSIS — E860 Dehydration: Secondary | ICD-10-CM | POA: Diagnosis present

## 2024-03-29 DIAGNOSIS — I739 Peripheral vascular disease, unspecified: Secondary | ICD-10-CM | POA: Diagnosis present

## 2024-03-29 DIAGNOSIS — N390 Urinary tract infection, site not specified: Secondary | ICD-10-CM | POA: Diagnosis present

## 2024-03-29 DIAGNOSIS — N419 Inflammatory disease of prostate, unspecified: Secondary | ICD-10-CM | POA: Diagnosis present

## 2024-03-29 DIAGNOSIS — K760 Fatty (change of) liver, not elsewhere classified: Secondary | ICD-10-CM | POA: Diagnosis present

## 2024-03-29 DIAGNOSIS — Z888 Allergy status to other drugs, medicaments and biological substances status: Secondary | ICD-10-CM | POA: Diagnosis not present

## 2024-03-29 DIAGNOSIS — N179 Acute kidney failure, unspecified: Secondary | ICD-10-CM | POA: Diagnosis present

## 2024-03-29 DIAGNOSIS — Z6830 Body mass index (BMI) 30.0-30.9, adult: Secondary | ICD-10-CM | POA: Diagnosis not present

## 2024-03-29 DIAGNOSIS — I959 Hypotension, unspecified: Secondary | ICD-10-CM | POA: Diagnosis present

## 2024-03-29 DIAGNOSIS — Z87891 Personal history of nicotine dependence: Secondary | ICD-10-CM | POA: Diagnosis not present

## 2024-03-29 DIAGNOSIS — N183 Chronic kidney disease, stage 3 unspecified: Secondary | ICD-10-CM | POA: Diagnosis present

## 2024-03-29 DIAGNOSIS — R634 Abnormal weight loss: Secondary | ICD-10-CM | POA: Diagnosis present

## 2024-03-29 DIAGNOSIS — Z8249 Family history of ischemic heart disease and other diseases of the circulatory system: Secondary | ICD-10-CM | POA: Diagnosis not present

## 2024-03-29 DIAGNOSIS — E785 Hyperlipidemia, unspecified: Secondary | ICD-10-CM | POA: Diagnosis present

## 2024-03-29 DIAGNOSIS — Z7982 Long term (current) use of aspirin: Secondary | ICD-10-CM | POA: Diagnosis not present

## 2024-03-29 DIAGNOSIS — I129 Hypertensive chronic kidney disease with stage 1 through stage 4 chronic kidney disease, or unspecified chronic kidney disease: Secondary | ICD-10-CM | POA: Diagnosis present

## 2024-03-29 DIAGNOSIS — Z91030 Bee allergy status: Secondary | ICD-10-CM | POA: Diagnosis not present

## 2024-03-29 DIAGNOSIS — I252 Old myocardial infarction: Secondary | ICD-10-CM | POA: Diagnosis not present

## 2024-03-29 DIAGNOSIS — Z1152 Encounter for screening for COVID-19: Secondary | ICD-10-CM | POA: Diagnosis not present

## 2024-03-29 DIAGNOSIS — Z951 Presence of aortocoronary bypass graft: Secondary | ICD-10-CM | POA: Diagnosis not present

## 2024-03-29 LAB — URINE CULTURE: Culture: <10000 — AB

## 2024-03-29 LAB — COMPREHENSIVE METABOLIC PANEL WITH GFR
ALT: 40 U/L (ref 0–44)
AST: 49 U/L — ABNORMAL HIGH (ref 15–41)
Albumin: 2.8 g/dL — ABNORMAL LOW (ref 3.5–5.0)
Alkaline Phosphatase: 63 U/L (ref 38–126)
Anion gap: 11 (ref 5–15)
BUN: 24 mg/dL — ABNORMAL HIGH (ref 6–20)
CO2: 19 mmol/L — ABNORMAL LOW (ref 22–32)
Calcium: 8.2 mg/dL — ABNORMAL LOW (ref 8.9–10.3)
Chloride: 105 mmol/L (ref 98–111)
Creatinine, Ser: 2.54 mg/dL — ABNORMAL HIGH (ref 0.61–1.24)
GFR, Estimated: 28 mL/min — ABNORMAL LOW (ref >60)
Glucose, Bld: 81 mg/dL (ref 70–99)
Potassium: 3.9 mmol/L (ref 3.5–5.1)
Sodium: 135 mmol/L (ref 135–145)
Total Bilirubin: 1.1 mg/dL (ref 0.0–1.2)
Total Protein: 5.5 g/dL — ABNORMAL LOW (ref 6.5–8.1)

## 2024-03-29 LAB — RESP PANEL BY RT-PCR (RSV, FLU A&B, COVID)  RVPGX2
Influenza A by PCR: NEGATIVE
Influenza B by PCR: NEGATIVE
Resp Syncytial Virus by PCR: NEGATIVE
SARS Coronavirus 2 by RT PCR: NEGATIVE

## 2024-03-29 LAB — CBC
HCT: 32.6 % — ABNORMAL LOW (ref 39.0–52.0)
Hemoglobin: 11.2 g/dL — ABNORMAL LOW (ref 13.0–17.0)
MCH: 35 pg — ABNORMAL HIGH (ref 26.0–34.0)
MCHC: 34.4 g/dL (ref 30.0–36.0)
MCV: 101.9 fL — ABNORMAL HIGH (ref 80.0–100.0)
Platelets: 131 K/uL — ABNORMAL LOW (ref 150–400)
RBC: 3.2 MIL/uL — ABNORMAL LOW (ref 4.22–5.81)
RDW: 15.1 % (ref 11.5–15.5)
WBC: 5.9 K/uL (ref 4.0–10.5)
nRBC: 0 % (ref 0.0–0.2)

## 2024-03-29 MED ORDER — SODIUM CHLORIDE 0.9 % IV SOLN
12.5000 mg | Freq: Four times a day (QID) | INTRAVENOUS | Status: AC | PRN
  Administered 2024-03-29: 12.5 mg via INTRAVENOUS
  Filled 2024-03-29: qty 12.5

## 2024-03-29 MED ORDER — ONDANSETRON HCL 4 MG/2ML IJ SOLN
4.0000 mg | Freq: Four times a day (QID) | INTRAMUSCULAR | Status: DC | PRN
  Administered 2024-03-29 – 2024-03-31 (×2): 4 mg via INTRAVENOUS
  Filled 2024-03-29 (×2): qty 2

## 2024-03-29 MED ORDER — ENOXAPARIN SODIUM 40 MG/0.4ML IJ SOSY
40.0000 mg | PREFILLED_SYRINGE | INTRAMUSCULAR | Status: DC
  Administered 2024-03-29 – 2024-03-30 (×2): 40 mg via SUBCUTANEOUS
  Filled 2024-03-29 (×2): qty 0.4

## 2024-03-29 MED ORDER — SODIUM CHLORIDE 0.9 % IV SOLN: INTRAVENOUS | Status: AC

## 2024-03-29 MED ORDER — HYDRALAZINE HCL 20 MG/ML IJ SOLN: 5.0000 mg | INTRAMUSCULAR | Status: DC | PRN

## 2024-03-29 NOTE — Assessment & Plan Note (Signed)
 Home aspirin  81 mg daily, atorvastatin  80 mg daily

## 2024-03-29 NOTE — Progress Notes (Signed)
   03/29/24 1029  TOC Brief Assessment  Insurance and Status Reviewed  Patient has primary care physician Yes  Home environment has been reviewed From home w/his mother, both are independent  Prior level of function: Independent  Prior/Current Home Services No current home services  Social Drivers of Health Review SDOH reviewed no interventions necessary  Readmission risk has been reviewed Yes  Transition of care needs no transition of care needs at this time   Transition of Care Department [facility]) has reviewed patient and no other TOC needs have been identified at this time. We will continue to monitor patient advancement through interdisciplinary progression rounds. If new patient needs arise, please place a TOC consult.

## 2024-03-29 NOTE — Assessment & Plan Note (Signed)
 Home antihypertensive medications have been held on admission due to low blood pressure and acute kidney injury Hydrochlorothiazide  25 mg daily, lisinopril  10 mg daily, metoprolol  succinate 25 mg daily have been held Hydralazine  5 mg IV every 4 hours as needed for SBP greater than 165, 5 days ordered Home lisinopril  10 mg daily, metoprolol  succinate 25 mg daily were resumed on discharge.  Hold your home hydrochlorothiazide  25 mg daily until you see your primary care doctor.  Patient endorses understanding and compliance.

## 2024-03-29 NOTE — Assessment & Plan Note (Signed)
 On no prior CKD Secondary to prerenal in setting of poor p.o. intake leading to hypotension Registered dietitian has been consulted Strict I's and O's Continue sodium chloride  infusion at 150 mL/h, 1 day ordered Recheck CMP in the a.m.

## 2024-03-29 NOTE — Assessment & Plan Note (Signed)
 Aspirin 81 mg daily.

## 2024-03-29 NOTE — Assessment & Plan Note (Signed)
 Suspect secondary to poor p.o. intake Continue aggressive fluid hydration

## 2024-03-29 NOTE — ED Notes (Signed)
 Floor notified patient coming up

## 2024-03-29 NOTE — Progress Notes (Addendum)
 PROGRESS NOTE  Larry Pham  FMW:[id number] DOB: Apr 01, 1966 DOA: 03/28/2024 PCP: [facility]   [name] is a 58 year old male with history of hyperlipidemia, GERD, hypertension, CAD status post CABG, PAD, no prior CKD, who presents to the ED on 03/28/2024 for chief concerns of hypotension.  03/28/2024: Patient was admitted to hospitalist service for acute kidney injury and hypotension.  Patient was treated with aggressive fluid hydration.  In the ED, patient received 2 L sodium chloride  bolus.  On admission, sodium chloride  500 mL bolus was given followed by sodium chloride  infusion at 125 mL/h.  11/21: I assumed care of the patient.  Renal function has improved however has not resumed to baseline.  Patient's baseline renal function is 1.14/eGFR greater than 60.  Serum creatinine today is 2.54/eGFR of 28.  Will transfer patient out of PCU and to telemetry medical floor.  Assessment & Plan:   Principal Problem:   AKI (acute kidney injury) Active Problems:   Hyperlipidemia with target LDL less than 70   Essential hypertension   Coronary artery disease involving native coronary artery of native heart with angina pectoris   PAD (peripheral artery disease)   CKD (chronic kidney disease), stage II   Hypotension   S/P CABG x 3   Appetite loss   Assessment and Plan:  * AKI (acute kidney injury) On no prior CKD Secondary to prerenal in setting of poor p.o. intake leading to hypotension Registered dietitian has been consulted Strict I's and O's Continue sodium chloride  infusion at 150 mL/h, 1 day ordered Recheck CMP in the a.m.  Appetite loss Happen suddenly 2 to 3 weeks ago He cannot taste his food Query upper respiratory infection including COVID Check respiratory panel, COVID/influenza A/influenza B/RSV PCR  Hypotension Suspect secondary to poor p.o. intake Continue aggressive fluid hydration  PAD (peripheral artery disease) Aspirin  81 mg  daily  Coronary artery disease involving native coronary artery of native heart with angina pectoris Home aspirin  81 mg daily, atorvastatin  80 mg daily  Essential hypertension Home antihypertensive medications have been held on admission due to low blood pressure and acute kidney injury Hydrochlorothiazide  25 mg daily, lisinopril  10 mg daily, metoprolol  succinate 25 mg daily have been held Hydralazine  5 mg IV every 4 hours as needed for SBP greater than 165, 5 days ordered  Hyperlipidemia with target LDL less than 70 Atorvastatin  80 mg daily, ezetimibe  10 mg daily  DVT prophylaxis: Enoxaparin  Code Status: Full code Family Communication: No Disposition Plan: Pending clinical course, at this time not medically ready for discharge Level of care: Telemetry  Consultants:  Registered dietitian PT, OT to evaluate patient tomorrow, 11/22  Procedures:  None at this time  Antimicrobials: None indicated  Subjective:  At bedside, patient was able to tell me his first and last name, age, location, current calendar year.  He reports that over the last 2 to 3 weeks, he has had poor p.o. intake despite trying to eat his usually favorite food items.  He reports that he cannot taste his food and this has been ongoing for the last 2 to 3 weeks.  He denies known sick contacts.  He endorsed that he did have some congestion and nasal sinus drip at the back of his throat.  He reports that he noticed the symptoms about 2 to 3 weeks ago.  Of note, he endorsed 2 episodes of vomiting on Wednesday when he tried to eat.  He reports he has not had any  more vomiting since then.  He denies known trauma to his person, chest pain, current nausea, vomiting, fever, chills, cough.  Patient has had an unintentional 25 pound weight loss in the last 3 weeks.  Objective: Vitals:   03/29/24 0330 03/29/24 0530 03/29/24 0553 03/29/24 0957  BP: 105/71 (!) 112/57  94/60  Pulse: 60 (!) 59  86  Resp: 20 14  18   Temp:    97.7 F (36.5 C)   TempSrc:   Oral   SpO2: 98% 97%  94%  Weight:      Height:        Intake/Output Summary (Last 24 hours) at 03/29/2024 1112 Last data filed at 03/28/2024 1953 Gross per 24 hour  Intake 2500 ml  Output --  Net 2500 ml   Filed Weights   03/28/24 1133  Weight: 101 kg   Examination:  General exam: Appears calm and comfortable  Respiratory system: Clear to auscultation. Respiratory effort normal. Cardiovascular system: S1 & S2 heard, RRR. No JVD, murmurs, rubs, gallops or clicks. No pedal edema. Gastrointestinal system: Abdomen is nondistended, soft and nontender. No organomegaly or masses felt. Normal bowel sounds heard. Central nervous system: Alert and oriented. No focal neurological deficits. Extremities: Symmetric 5 x 5 power. Skin: No rashes, lesions or ulcers Psychiatry: Judgement and insight appear normal. Mood & affect appropriate.   Data Reviewed: I have personally reviewed following labs and imaging studies  CBC: Recent Labs  Lab 03/25/24 1254 03/28/24 1217 03/29/24 0423  WBC 7.0 7.5 5.9  NEUTROABS 4.9 5.0  --   HGB 13.9 12.5* 11.2*  HCT 39.7 34.7* 32.6*  MCV 100.3* 98.3 101.9*  PLT 167 166 131*   Basic Metabolic Panel: Recent Labs  Lab 03/25/24 1254 03/28/24 1217 03/29/24 0423  NA 137 131* 135  K 4.0 4.1 3.9  CL 97* 96* 105  CO2 25 22 19*  GLUCOSE 87 91 81  BUN 12 25* 24*  CREATININE 1.14 3.12* 2.54*  CALCIUM  9.9 9.2 8.2*   GFR: Estimated Creatinine Clearance: 37.8 mL/min (A) (by C-G formula based on SCr of 2.54 mg/dL (H)).  Liver Function Tests: Recent Labs  Lab 03/25/24 1254 03/28/24 1217 03/29/24 0423  AST 116* 65* 49*  ALT 79* 52* 40  ALKPHOS 118 78 63  BILITOT 1.7* 1.2 1.1  PROT 7.6 6.8 5.5*  ALBUMIN  4.4 3.6 2.8*   Recent Labs  Lab 03/25/24 1254  LIPASE 27   Sepsis Labs: Recent Labs  Lab 03/28/24 1539 03/28/24 1836  LATICACIDVEN 0.9 2.2*   Recent Results (from the past 240 hours)  Microscopic  Examination     Status: Abnormal   Collection Time: 03/27/24 10:14 AM   Urine  Result Value Ref Range Status   WBC, UA 0-5 0 - 5 /hpf Final   RBC, Urine 0-2 0 - 2 /hpf Final   Epithelial Cells (non renal) 0-10 0 - 10 /hpf Final   Casts Present (A) None seen /lpf Final   Cast Type Granular casts (A) N/A Final   Bacteria, UA Few (A) None seen/Few Final  Culture, blood (routine x 2)     Status: None (Preliminary result)   Collection Time: 03/28/24  1:18 PM   Specimen: BLOOD LEFT ARM  Result Value Ref Range Status   Specimen Description BLOOD LEFT ARM  Final   Special Requests   Final    BOTTLES DRAWN AEROBIC AND ANAEROBIC Blood Culture adequate volume   Culture   Final    NO GROWTH <  24 HOURS Performed at [facility], [address], [place], [place] [address]    Report Status PENDING  Incomplete  Urine Culture     Status: Abnormal   Collection Time: 03/28/24  1:35 PM   Specimen: Urine, Clean Catch  Result Value Ref Range Status   Specimen Description URINE, CLEAN CATCH  Final   Special Requests NONE  Final   Culture (A)  Final    <10,000 COLONIES/mL INSIGNIFICANT GROWTH Performed at [facility], [address], [place], [place] [address]    Report Status 03/29/2024 FINAL  Final  Culture, blood (routine x 2)     Status: None (Preliminary result)   Collection Time: 03/28/24  7:55 PM   Specimen: BLOOD  Result Value Ref Range Status   Specimen Description BLOOD SITE NOT SPECIFIED  Final   Special Requests   Final    BOTTLES DRAWN AEROBIC ONLY Blood Culture adequate volume   Culture   Final    NO GROWTH < 12 HOURS Performed at [facility], [address], [place], [place] [address]    Report Status PENDING  Incomplete    Radiology Studies: DG Chest Portable 1 View Result Date: 03/28/2024 EXAM: 1 VIEW(S) XRAY OF THE CHEST 03/28/2024 02:34:00 PM COMPARISON: 11/28/2019 status post coronary artery bypass graft. CLINICAL HISTORY: hypotension FINDINGS: LUNGS  AND PLEURA: No focal pulmonary opacity. No pleural effusion. No pneumothorax. HEART AND MEDIASTINUM: Status post coronary artery bypass graft. No acute abnormality of the cardiac and mediastinal silhouettes. BONES AND SOFT TISSUES: No acute osseous abnormality. IMPRESSION: 1. No acute cardiopulmonary process. Electronically signed by: Lynwood Seip MD 03/28/2024 03:06 PM EST RP Workstation: [id number]   Scheduled Meds:  aspirin  EC  81 mg Oral Daily   atorvastatin   80 mg Oral Daily   enoxaparin  (LOVENOX ) injection  30 mg Subcutaneous Q24H   ezetimibe   10 mg Oral Daily   pantoprazole   20 mg Oral Daily   sodium chloride  flush  3 mL Intravenous Q12H   sulfamethoxazole -trimethoprim   1 tablet Oral BID   Continuous Infusions:  sodium chloride  150 mL/hr at 03/29/24 1053    LOS: 0 days   Time spent: 50 minutes  Dr. Sherre [facility] If 7PM-7AM, please contact night-coverage 03/29/2024, 11:12 AM

## 2024-03-29 NOTE — Care Management Obs Status (Signed)
 MEDICARE OBSERVATION STATUS NOTIFICATION   Patient Details  Name: Larry Pham MRN: [id number] Date of Birth: 10/12/1965   Medicare Observation Status Notification Given:  Yes    Nena LITTIE Coffee, RN 03/29/2024, 9:50 AM

## 2024-03-29 NOTE — Assessment & Plan Note (Signed)
 Atorvastatin  80 mg daily, ezetimibe  10 mg daily

## 2024-03-29 NOTE — Hospital Course (Addendum)
 [name] is a 58 year old male with history of hyperlipidemia, GERD, hypertension, CAD status post CABG, PAD, no prior CKD, who presents to the ED on 03/28/2024 for chief concerns of hypotension.  03/28/2024: Patient was admitted to hospitalist service for acute kidney injury and hypotension.  Patient was treated with aggressive fluid hydration.  In the ED, patient received 2 L sodium chloride  bolus.  On admission, sodium chloride  500 mL bolus was given followed by sodium chloride  infusion at 125 mL/h.  11/21: I assumed care of the patient.  Renal function has improved however has not resumed to baseline.  Patient's baseline renal function is 1.14/eGFR greater than 60.  Serum creatinine today is 2.54/eGFR of 28.  Will transfer patient out of PCU and to telemetry medical floor.

## 2024-03-29 NOTE — Assessment & Plan Note (Signed)
 Happen suddenly 2 to 3 weeks ago He cannot taste his food Query upper respiratory infection including COVID Check respiratory panel, COVID/influenza A/influenza B/RSV PCR

## 2024-03-30 DIAGNOSIS — N390 Urinary tract infection, site not specified: Secondary | ICD-10-CM

## 2024-03-30 DIAGNOSIS — N179 Acute kidney failure, unspecified: Secondary | ICD-10-CM | POA: Diagnosis not present

## 2024-03-30 LAB — COMPREHENSIVE METABOLIC PANEL WITH GFR
ALT: 38 U/L (ref 0–44)
AST: 46 U/L — ABNORMAL HIGH (ref 15–41)
Albumin: 2.6 g/dL — ABNORMAL LOW (ref 3.5–5.0)
Alkaline Phosphatase: 50 U/L (ref 38–126)
Anion gap: 9 (ref 5–15)
BUN: 21 mg/dL — ABNORMAL HIGH (ref 6–20)
CO2: 19 mmol/L — ABNORMAL LOW (ref 22–32)
Calcium: 8 mg/dL — ABNORMAL LOW (ref 8.9–10.3)
Chloride: 107 mmol/L (ref 98–111)
Creatinine, Ser: 1.82 mg/dL — ABNORMAL HIGH (ref 0.61–1.24)
GFR, Estimated: 42 mL/min — ABNORMAL LOW (ref >60)
Glucose, Bld: 78 mg/dL (ref 70–99)
Potassium: 4.4 mmol/L (ref 3.5–5.1)
Sodium: 135 mmol/L (ref 135–145)
Total Bilirubin: 0.9 mg/dL (ref 0.0–1.2)
Total Protein: 5 g/dL — ABNORMAL LOW (ref 6.5–8.1)

## 2024-03-30 LAB — CBC
HCT: 29.4 % — ABNORMAL LOW (ref 39.0–52.0)
Hemoglobin: 10.1 g/dL — ABNORMAL LOW (ref 13.0–17.0)
MCH: 34.6 pg — ABNORMAL HIGH (ref 26.0–34.0)
MCHC: 34.4 g/dL (ref 30.0–36.0)
MCV: 100.7 fL — ABNORMAL HIGH (ref 80.0–100.0)
Platelets: 149 K/uL — ABNORMAL LOW (ref 150–400)
RBC: 2.92 MIL/uL — ABNORMAL LOW (ref 4.22–5.81)
RDW: 15.1 % (ref 11.5–15.5)
WBC: 5.1 K/uL (ref 4.0–10.5)
nRBC: 0.4 % — ABNORMAL HIGH (ref 0.0–0.2)

## 2024-03-30 LAB — C DIFFICILE QUICK SCREEN W PCR REFLEX
C Diff antigen: NEGATIVE
C Diff interpretation: NOT DETECTED
C Diff toxin: NEGATIVE

## 2024-03-30 MED ORDER — SODIUM CHLORIDE 0.9 % IV SOLN: INTRAVENOUS | Status: AC

## 2024-03-30 MED ORDER — SODIUM CHLORIDE 0.9 % IV SOLN: 12.5000 mg | Freq: Four times a day (QID) | INTRAVENOUS | Status: DC | PRN

## 2024-03-30 MED ORDER — NITROFURANTOIN MONOHYD MACRO 100 MG PO CAPS
100.0000 mg | ORAL_CAPSULE | Freq: Two times a day (BID) | ORAL | Status: DC
  Administered 2024-03-30 – 2024-03-31 (×2): 100 mg via ORAL
  Filled 2024-03-30 (×5): qty 1

## 2024-03-30 MED ORDER — NICOTINE 21 MG/24HR TD PT24
21.0000 mg | MEDICATED_PATCH | Freq: Every day | TRANSDERMAL | Status: DC | PRN
  Administered 2024-03-30: 21 mg via TRANSDERMAL
  Filled 2024-03-30 (×2): qty 1

## 2024-03-30 MED ORDER — ENSURE PLUS HIGH PROTEIN PO LIQD
237.0000 mL | Freq: Two times a day (BID) | ORAL | Status: DC
  Administered 2024-03-30: 237 mL via ORAL

## 2024-03-30 NOTE — Evaluation (Signed)
 Occupational Therapy Evaluation and Discharge Patient Details Name: Larry Pham MRN: [id number] DOB: [birth year] Today's Date: 03/30/2024   History of Present Illness   58 y.o. male admitted 03/28/24 for AKI and hypotension. CT showed bladder wall thickening and nodular prostate with protrusion into bladder. PMHx: HLD, GERD, HTN, CAD s/p CABG, MI, CKD, PAD, EtOH abuse. Of note, recently presented to [facility] ED for weight loss (pt reporting ~25lbs), persistent nausea, and urinary pressure with work-up showing possible cystitis and BPH.     Clinical Impressions Pt is currently Mod I for functional transfers and for ADL engagement. Pt is currently presenting near baseline functions and does not indicate a need for continued skilled OT services in acute or post acute care. OT to sign off.      If plan is discharge home, recommend the following:         Functional Status Assessment   Patient has not had a recent decline in their functional status     Equipment Recommendations         Recommendations for Other Services         Precautions/Restrictions   Precautions Precautions: Fall Recall of Precautions/Restrictions: Intact Restrictions Weight Bearing Restrictions Per Provider Order: No     Mobility Bed Mobility Overal bed mobility: Modified Independent             General bed mobility comments: No physical assist required    Transfers Overall transfer level: Modified independent Equipment used: None               General transfer comment: Pt able to engage in functional mobility with Mod I      Balance Overall balance assessment: Mild deficits observed, not formally tested                                         ADL either performed or assessed with clinical judgement   ADL Overall ADL's : Modified independent                                       General ADL Comments: Pt reports he is very near  his baseline. Pt able to don/doff LB clothing and reports using the bathroom without assistance.     Vision Patient Visual Report: No change from baseline Vision Assessment?: No apparent visual deficits     Perception         Praxis         Pertinent Vitals/Pain Pain Assessment Pain Assessment: No/denies pain     Extremity/Trunk Assessment Upper Extremity Assessment Upper Extremity Assessment: Overall WFL for tasks assessed   Lower Extremity Assessment Lower Extremity Assessment: Defer to PT evaluation   Cervical / Trunk Assessment Cervical / Trunk Assessment: Normal   Communication Communication Communication: No apparent difficulties   Cognition Arousal: Alert Behavior During Therapy: WFL for tasks assessed/performed Cognition: No apparent impairments                               Following commands: Intact       Cueing  General Comments   Cueing Techniques: Verbal cues  BP assessed as follows: supine: 109/68 (82); sitting: 112/86 (94); standing: 91/72 (78); standing 3 mins: 110/75 (87). Pt with no  complaints of dizziness. Noted posterior postural sway in standing, Pt able to self correct and stated that it is d/t RLE weakness.   Exercises     Shoulder Instructions      Home Living Family/patient expects to be discharged to:: Private residence Living Arrangements: Parent (mother) Available Help at Discharge: Family;Friend(s);Available 24 hours/day Type of Home: House Home Access: Stairs to enter [employer] of Steps: 1 Entrance Stairs-Rails: None Home Layout: Two level;Able to live on main level with bedroom/bathroom Alternate Level Stairs-Number of Steps: 13 Alternate Level Stairs-Rails: Left Bathroom Shower/Tub: Chief Strategy Officer: Handicapped height Bathroom Accessibility: Yes How Accessible: Accessible via walker Home Equipment: Grab bars - tub/shower          Prior Functioning/Environment Prior  Level of Function : Independent/Modified Independent;Driving;History of Falls (last six months)             Mobility Comments: No AD, reports one fall in past 6 months from drinking ADLs Comments: Independent, completes IADLs to help his mother    OT Problem List:     OT Treatment/Interventions:        OT Goals(Current goals can be found in the care plan section)       OT Frequency:       Co-evaluation              AM-PAC OT 6 Clicks Daily Activity     Outcome Measure Help from another person eating meals?: None Help from another person taking care of personal grooming?: None Help from another person toileting, which includes using toliet, bedpan, or urinal?: None Help from another person bathing (including washing, rinsing, drying)?: None Help from another person to put on and taking off regular upper body clothing?: None Help from another person to put on and taking off regular lower body clothing?: None 6 Click Score: 24   End of Session Nurse Communication: Mobility status  Activity Tolerance: Patient tolerated treatment well Patient left: in bed;with call bell/phone within reach                   Time: 1135-1147 OT Time Calculation (min): 12 min Charges:  OT General Charges $OT Visit: 1 Visit OT Evaluation $OT Eval Low Complexity: 1 Low  Maurilio CROME, OTR/L.  [facility]  Office: [phone]   Maurilio PARAS Weslyn Holsonback 03/30/2024, 1:19 PM

## 2024-03-30 NOTE — Assessment & Plan Note (Signed)
 Diagnosed outpatient, was on bactrim . He took 1.5 days of it and refuses it in the hospital due to N/V. Nitrofurantoin  100 mg BID, 5 days ordered

## 2024-03-30 NOTE — Progress Notes (Signed)
 Initial Nutrition Assessment  DOCUMENTATION CODES:   Not applicable  INTERVENTION:  Ensure Plus High Protein po BID, each supplement provides 350 kcal and 20 grams of protein.  Magic cup TID with meals, each supplement provides 290 kcal and 9 grams of protein  Encourage adequate meal and fluid intake to meet pt's calorie and protein needs for recovery  NUTRITION DIAGNOSIS:   Inadequate oral intake related to poor appetite as evidenced by meal completion < 25%, per patient/family report.  GOAL:   Patient will meet greater than or equal to 90% of their needs  MONITOR:   PO intake, Supplement acceptance  REASON FOR ASSESSMENT:   Consult Assessment of nutrition requirement/status  ASSESSMENT:   Pt with hx of HTN. HLD, CAD s/p CABG (2020), PAD, CVA, and alcohol use. Admitted w/ weakness/dizziness, diagnosed with AKI.  Pt receiving sodium chloride  infusion for AKI. AKI in the setting of appetite loss/poor PO. RD consulted to assess pt and make recommendations for PO intake. Assessing for C. Diff.  Pt on regular diet with 1 meal recorded since admission which was 0%. RD working remotely and attempted to reach pt by phone x 2 but unsuccessful in reaching pt. All information obtained per chart review.   Limited wt hx available in chart, unable to determine if any recent wt loss has occurred. Per pt report to MD, pt has experienced 25# unintentional wt loss in the last 3 weeks due to poor PO intake. Pt reported to MD that he has not been able to taste foods for about 2-3 weeks and endorses two emesis episodes a few days ago. Pt presents to hospital with diarrhea, C diff being tested. No allergies present in chart related to food, pt would likely benefit from ONS like Ensure to help encourage PO intake and fluid intake. Will also add magic cups to trays. Pt would also benefit from small, frequent meals and should utilize floor snacks in between meals if pt cannot finish full meals sent on  trays.  NUTRITION - FOCUSED PHYSICAL EXAM:  Deferred to follow up  Diet Order:   Diet Order             Diet regular Room service appropriate? Yes; Fluid consistency: Thin  Diet effective now                   EDUCATION NEEDS:   Not appropriate for education at this time  Skin:  Skin Assessment: Reviewed RN Assessment  Last BM:  11/21 type 7  Height:   Ht Readings from Last 1 Encounters:  03/28/24 5' 11.5 (1.816 m)    Weight:   Wt Readings from Last 1 Encounters:  03/28/24 101 kg    Ideal Body Weight:  79.5 kg  BMI:  Body mass index is 30.62 kg/m.  Estimated Nutritional Needs:   Kcal:  2000-2200  Protein:  90-110g  Fluid:  > 2L    Josette Glance, MS, RDN, LDN Clinical Dietitian I Please reach out via secure chat

## 2024-03-30 NOTE — Evaluation (Signed)
 Physical Therapy Brief Evaluation and Discharge Note Patient Details Name: Larry Pham MRN: [id number] DOB: 01-03-1966 Today's Date: 03/30/2024   History of Present Illness  58 y.o. male admitted 03/28/24 for AKI and hypotension. CT showed bladder wall thickening and nodular prostate with protrusion into bladder. PMHx: HLD, GERD, HTN, CAD s/p CABG, MI, CKD, PAD, EtOH abuse. Of note, recently presented to [facility] ED for weight loss (pt reporting ~25lbs), persistent nausea, and urinary pressure with work-up showing possible cystitis and BPH.   Clinical Impression  Pt greeted seated EOB, pleasant and agreeable to PT evaluation. PTA, pt was independent with functional mobility, ADLs/IADLs, and driving. He lives with his mom in a two story house with 1 STE and 13 steps to the basement. Pt with asymptomatic orthostatic hypotension. His SBP dropped 21 mmHg and DBP dropped 14 mmHg between sit>stand. Pt recovered with BP increasing after 3 mins of static stance. Overall, pt was modI for functional mobility. He ambulated ~174ft using a reciprocal gait pattern and ascended/descended a flight of stairs with unilateral handrail. Pt is currently presenting near baseline function, no further need for skilled PT services indicated. PT to sign off. Pt feels ready and safe for discharge home. I have answered all his questions related to mobility.        03/30/24 1300  Vital Signs  Patient Position (if appropriate) Orthostatic Vitals  Orthostatic Lying   BP- Lying 109/68  Orthostatic Sitting  BP- Sitting 112/86  Orthostatic Standing at 0 minutes  BP- Standing at 0 minutes 91/72  Orthostatic Standing at 3 minutes  BP- Standing at 3 minutes 110/75        PT Assessment Patient does not need any further PT services  Assistance Needed at Discharge  None    Equipment Recommendations None recommended by PT  Recommendations for Other Services       Precautions/Restrictions Precautions Precautions:  Fall Recall of Precautions/Restrictions: Intact Restrictions Weight Bearing Restrictions Per Provider Order: No        Mobility  Bed Mobility     Sit to supine/sidelying: Modified independent (Device/Increased time) General bed mobility comments: Pt completed sit>supine with HOB slightly elevated.  Transfers Overall transfer level: Modified independent Equipment used: None               General transfer comment: Pt stood from lowest bed height. He pushed up with BUE support. Good eccentric control.    Ambulation/Gait Ambulation/Gait assistance: Modified independent (Device/Increase time) Gait Distance (Feet): 150 Feet Assistive device: None Gait Pattern/deviations: Step-through pattern, Decreased stride length Gait Speed: Pace WFL General Gait Details: Pt ambulated with a reciprocal gait pattern, even weight shift, and symmetrical arm swing.  Home Activity Instructions    Stairs Stairs: Yes Stairs assistance: Modified independent (Device/Increase time) Stair Management: One rail Right, Forwards, Alternating pattern Number of Stairs: 10 General stair comments: Pt ascended/descended a flight of stairs with a reciprocal gait pattern and unilateral UE support.  Modified Rankin (Stroke Patients Only)        Balance Overall balance assessment: Mild deficits observed, not formally tested Sitting-balance support: No upper extremity supported, Feet supported Sitting balance-Leahy Scale: Good     Standing balance support: No upper extremity supported, During functional activity Standing balance-Leahy Scale: Fair Standing balance comment: Pt with posterior sway in standing. Able to self-correct.          Pertinent Vitals/Pain   Pain Assessment Pain Assessment: No/denies pain     Home Living Family/patient expects to be  discharged to:: Private residence Living Arrangements: Parent (Mother) Available Help at Discharge: Family;Available 24  hours/day;Available PRN/intermittently Home Environment: Stairs to enter;Stairs in home;Rail - right  Stairs-Number of Steps: 1; 13 (front; to basement) Home Equipment: Grab bars - tub/shower        Prior Function Level of Independence: Independent Comments: Ambulates without AD. Completes IADLs to help his mom. Reports one fall in past 6 months from drinking.    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 Pt denied dizziness, lightheadedness, nausea.    Exercises     Assessment/Plan    PT Problem List         PT Visit Diagnosis Unsteadiness on feet (R26.81);Other abnormalities of gait and mobility (R26.89)    No Skilled PT Patient is modified independent with all activity/mobility;All education completed   Co-evaluation                AMPAC 6 Clicks Help needed turning from your back to your side while in a flat bed without using bedrails?: None Help needed moving from lying on your back to sitting on the side of a flat bed without using bedrails?: None Help needed moving to and from a bed to a chair (including a wheelchair)?: None Help needed standing up from a chair using your arms (e.g., wheelchair or bedside chair)?: None Help needed to walk in hospital room?: None Help needed climbing 3-5 steps with a railing? : None 6 Click Score: 24      End of Session Equipment Utilized During Treatment: Gait belt Activity Tolerance: Patient tolerated treatment well;No increased pain Patient left: in bed;with call bell/phone within reach Nurse  Communication: Mobility status PT Visit Diagnosis: Unsteadiness on feet (R26.81);Other abnormalities of gait and mobility (R26.89)     Time: 8852-8840 PT Time Calculation (min) (ACUTE ONLY): 12 min  Charges:   PT Evaluation $PT Eval Low Complexity: 1 Low      Randall SAUNDERS, PT, DPT Acute Rehabilitation Services Office: [phone] Secure Chat Preferred  Larry Pham  03/30/2024, 2:03 PM

## 2024-03-30 NOTE — Plan of Care (Signed)
  Problem: Activity: Goal: Risk for activity intolerance will decrease Outcome: Progressing   Problem: Nutrition: Goal: Adequate nutrition will be maintained Outcome: Progressing   Problem: Elimination: Goal: Will not experience complications related to urinary retention Outcome: Progressing   Problem: Skin Integrity: Goal: Risk for impaired skin integrity will decrease Outcome: Progressing   

## 2024-03-30 NOTE — Assessment & Plan Note (Signed)
 -  As needed nicotine patch ordered ?

## 2024-03-30 NOTE — Progress Notes (Signed)
 PROGRESS NOTE  EMANUAL LAMOUNTAIN  FMW:[id number] DOB: 1966-04-23 DOA: 03/28/2024 PCP: [facility]   [name] is a 58 year old male with history of hyperlipidemia, GERD, hypertension, CAD status post CABG, PAD, no prior CKD, who presents to the ED on 03/28/2024 for chief concerns of hypotension.  03/28/2024: Patient was admitted to hospitalist service for acute kidney injury and hypotension.  Patient was treated with aggressive fluid hydration.  In the ED, patient received 2 L sodium chloride  bolus.  On admission, sodium chloride  500 mL bolus was given followed by sodium chloride  infusion at 125 mL/h.  11/21: I assumed care of the patient.  Renal function has improved however has not resumed to baseline.  Patient's baseline renal function is 1.14/eGFR > 60.  Serum creatinine today is 2.54/eGFR of 28.  Will transfer patient out of PCU and to telemetry medical floor.  11/22: Serum creatinine noted to be 1.82/eGFR 42 today.  This is an improvement however still not back to patient's baseline of sCr 1.4/eGFT > 60.  Assessment & Plan:   Principal Problem:   AKI (acute kidney injury) Active Problems:   UTI (urinary tract infection)   Tobacco use   Hyperlipidemia with target LDL less than 70   Essential hypertension   Coronary artery disease involving native coronary artery of native heart with angina pectoris   PAD (peripheral artery disease)   CKD (chronic kidney disease), stage II   Hypotension   S/P CABG x 3   Appetite loss   Assessment and Plan:  * AKI (acute kidney injury) On no prior CKD Secondary to prerenal in setting of poor p.o. intake leading to hypotension Registered dietitian has been consulted Strict I's and O's Continue NS infusion at 150 mL/h, 1 day ordered Recheck CMP in the a.m. 11/22: sCr 1.82/eGFR 42.  Sodium chloride  infusion renewed and changed to 125 ml/hr, 1 day ordered  UTI (urinary tract infection) Diagnosed outpatient, was  on bactrim . He took 1.5 days of it and refuses it in the hospital due to N/V. Nitrofurantoin  100 mg BID, 5 days ordered  Tobacco use As needed nicotine  patch ordered  Appetite loss Happen suddenly 2 to 3 weeks ago He cannot taste his food Query upper respiratory infection including COVID Check respiratory panel, COVID/influenza A/influenza B/RSV PCR  Hypotension Suspect secondary to poor p.o. intake Continue aggressive fluid hydration  PAD (peripheral artery disease) Aspirin  81 mg daily  Coronary artery disease involving native coronary artery of native heart with angina pectoris Home aspirin  81 mg daily, atorvastatin  80 mg daily  Essential hypertension Home antihypertensive medications have been held on admission due to low blood pressure and acute kidney injury Hydrochlorothiazide  25 mg daily, lisinopril  10 mg daily, metoprolol  succinate 25 mg daily have been held Hydralazine  5 mg IV every 4 hours as needed for SBP greater than 165, 5 days ordered  Hyperlipidemia with target LDL less than 70 Atorvastatin  80 mg daily, ezetimibe  10 mg daily  DVT prophylaxis: Enoxaparin  Code Status: Full code Family Communication: Updated cousin Garrel at bedside with patient's permission. Disposition Plan: Pending clinical course, not medically ready for discharge Level of care: Telemetry  Consultants:  Registered dietitian, PT, OT  Procedures:  None at this time  Antimicrobials: Not indicated  Subjective:  At bedside, patient awake alert and oriented x 3.  He is watching TV.  His cousin Garrel is in the room.  He does not appear to be in acute distress.  Discussed to him that  his renal function has continued to improve however is still not at baseline.  I explained that I would recommend continue IV fluids with recheck renal function tomorrow.  Patient is agreeable to this plan  Objective: Vitals:   03/29/24 2110 03/29/24 2134 03/30/24 0511 03/30/24 1056  BP: (!) 89/57 96/62 99/70   121/78  Pulse: 82 83 74 72  Resp:    18  Temp: 98.8 F (37.1 C)  98.3 F (36.8 C) 98.4 F (36.9 C)  TempSrc: Oral  Oral   SpO2: 99%  99% 100%  Weight:      Height:        Intake/Output Summary (Last 24 hours) at 03/30/2024 1515 Last data filed at 03/30/2024 0830 Gross per 24 hour  Intake 2317.26 ml  Output 150 ml  Net 2167.26 ml   Filed Weights   03/28/24 1133  Weight: 101 kg   Examination:  General exam: Appears calm and comfortable  Respiratory system: Clear to auscultation. Respiratory effort normal. Cardiovascular system: S1 & S2 heard, RRR. No JVD, murmurs, rubs, gallops or clicks. No pedal edema. Gastrointestinal system: Abdomen is nondistended, soft and nontender. No organomegaly or masses felt. Normal bowel sounds heard. Central nervous system: Alert and oriented. No focal neurological deficits. Extremities: Symmetric 5 x 5 power. Skin: No rashes, lesions or ulcers Psychiatry: Judgement and insight appear normal. Mood & affect appropriate.   Data Reviewed: I have personally reviewed following labs and imaging studies  CBC: Recent Labs  Lab 03/25/24 1254 03/28/24 1217 03/29/24 0423 03/30/24 0614  WBC 7.0 7.5 5.9 5.1  NEUTROABS 4.9 5.0  --   --   HGB 13.9 12.5* 11.2* 10.1*  HCT 39.7 34.7* 32.6* 29.4*  MCV 100.3* 98.3 101.9* 100.7*  PLT 167 166 131* 149*   Basic Metabolic Panel: Recent Labs  Lab 03/25/24 1254 03/28/24 1217 03/29/24 0423 03/30/24 0614  NA 137 131* 135 135  K 4.0 4.1 3.9 4.4  CL 97* 96* 105 107  CO2 25 22 19* 19*  GLUCOSE 87 91 81 78  BUN 12 25* 24* 21*  CREATININE 1.14 3.12* 2.54* 1.82*  CALCIUM  9.9 9.2 8.2* 8.0*   GFR: Estimated Creatinine Clearance: 52.7 mL/min (A) (by C-G formula based on SCr of 1.82 mg/dL (H)).  Liver Function Tests: Recent Labs  Lab 03/25/24 1254 03/28/24 1217 03/29/24 0423 03/30/24 0614  AST 116* 65* 49* 46*  ALT 79* 52* 40 38  ALKPHOS 118 78 63 50  BILITOT 1.7* 1.2 1.1 0.9  PROT 7.6 6.8 5.5*  5.0*  ALBUMIN  4.4 3.6 2.8* 2.6*   Recent Labs  Lab 03/25/24 1254  LIPASE 27   Sepsis Labs: Recent Labs  Lab 03/28/24 1539 03/28/24 1836  LATICACIDVEN 0.9 2.2*   Recent Results (from the past 240 hours)  Microscopic Examination     Status: Abnormal   Collection Time: 03/27/24 10:14 AM   Urine  Result Value Ref Range Status   WBC, UA 0-5 0 - 5 /hpf Final   RBC, Urine 0-2 0 - 2 /hpf Final   Epithelial Cells (non renal) 0-10 0 - 10 /hpf Final   Casts Present (A) None seen /lpf Final   Cast Type Granular casts (A) N/A Final   Bacteria, UA Few (A) None seen/Few Final  Culture, blood (routine x 2)     Status: None (Preliminary result)   Collection Time: 03/28/24  1:18 PM   Specimen: BLOOD LEFT ARM  Result Value Ref Range Status   Specimen Description BLOOD  LEFT ARM  Final   Special Requests   Final    BOTTLES DRAWN AEROBIC AND ANAEROBIC Blood Culture adequate volume   Culture   Final    NO GROWTH 2 DAYS Performed at [facility], [address] [place], [place] [address]    Report Status PENDING  Incomplete  Urine Culture     Status: Abnormal   Collection Time: 03/28/24  1:35 PM   Specimen: Urine, Clean Catch  Result Value Ref Range Status   Specimen Description URINE, CLEAN CATCH  Final   Special Requests NONE  Final   Culture (A)  Final    <10,000 COLONIES/mL INSIGNIFICANT GROWTH Performed at [facility], [address] [place], [place] [address]    Report Status 03/29/2024 FINAL  Final  Culture, blood (routine x 2)     Status: None (Preliminary result)   Collection Time: 03/28/24  7:55 PM   Specimen: BLOOD  Result Value Ref Range Status   Specimen Description BLOOD SITE NOT SPECIFIED  Final   Special Requests   Final    BOTTLES DRAWN AEROBIC ONLY Blood Culture adequate volume   Culture   Final    NO GROWTH 2 DAYS Performed at [facility], [address], [place], [place] [address]    Report Status PENDING  Incomplete  Resp panel by RT-PCR  (RSV, Flu A&B, Covid) Anterior Nasal Swab     Status: None   Collection Time: 03/29/24 11:11 AM   Specimen: Anterior Nasal Swab  Result Value Ref Range Status   SARS Coronavirus 2 by RT PCR NEGATIVE NEGATIVE Final   Influenza A by PCR NEGATIVE NEGATIVE Final   Influenza B by PCR NEGATIVE NEGATIVE Final    Comment: (NOTE) The Xpert Xpress SARS-CoV-2/FLU/RSV plus assay is intended as an aid in the diagnosis of influenza from Nasopharyngeal swab specimens and should not be used as a sole basis for treatment. Nasal washings and aspirates are unacceptable for Xpert Xpress SARS-CoV-2/FLU/RSV testing.  Fact Sheet for Patients: bloggercourse.com  Fact Sheet for Healthcare Providers: seriousbroker.it  This test is not yet approved or cleared by the [place]  FDA and has been authorized for detection and/or diagnosis of SARS-CoV-2 by FDA under an Emergency Use Authorization (EUA). This EUA will remain in effect (meaning this test can be used) for the duration of the COVID-19 declaration under Section 564(b)(1) of the Act, 21 U.S.C. section 360bbb-3(b)(1), unless the authorization is terminated or revoked.     Resp Syncytial Virus by PCR NEGATIVE NEGATIVE Final    Comment: (NOTE) Fact Sheet for Patients: bloggercourse.com  Fact Sheet for Healthcare Providers: seriousbroker.it  This test is not yet approved or cleared by the [place]  FDA and has been authorized for detection and/or diagnosis of SARS-CoV-2 by FDA under an Emergency Use Authorization (EUA). This EUA will remain in effect (meaning this test can be used) for the duration of the COVID-19 declaration under Section 564(b)(1) of the Act, 21 U.S.C. section 360bbb-3(b)(1), unless the authorization is terminated or revoked.  Performed at [facility], [address] [place], [place] [address]   C Difficile Quick  Screen w PCR reflex     Status: None   Collection Time: 03/29/24  4:25 PM   Specimen: Stool  Result Value Ref Range Status   C Diff antigen NEGATIVE NEGATIVE Final   C Diff toxin NEGATIVE NEGATIVE Final   C Diff interpretation No C. difficile detected.  Final  Comment: Performed at [facility], [address] [place], [place] [address]    Scheduled Meds:  aspirin  EC  81 mg Oral Daily   atorvastatin   80 mg Oral Daily   enoxaparin  (LOVENOX ) injection  40 mg Subcutaneous Q24H   ezetimibe   10 mg Oral Daily   feeding supplement  237 mL Oral BID BM   nitrofurantoin  (macrocrystal-monohydrate)  100 mg Oral Q12H   pantoprazole   20 mg Oral Daily   sodium chloride  flush  3 mL Intravenous Q12H   Continuous Infusions:  sodium chloride  125 mL/hr at 03/30/24 0925   promethazine  (PHENERGAN ) injection (IM or IVPB)      LOS: 1 day   Time spent: 50 minutes  Dr. Sherre [facility] If 7PM-7AM, please contact night-coverage 03/30/2024, 3:15 PM

## 2024-03-30 NOTE — Plan of Care (Signed)
   Problem: Activity: Goal: Risk for activity intolerance will decrease Outcome: Progressing   Problem: Pain Managment: Goal: General experience of comfort will improve and/or be controlled Outcome: Progressing   Problem: Safety: Goal: Ability to remain free from injury will improve Outcome: Progressing

## 2024-03-31 DIAGNOSIS — N179 Acute kidney failure, unspecified: Secondary | ICD-10-CM | POA: Diagnosis not present

## 2024-03-31 LAB — GASTROINTESTINAL PANEL BY PCR, STOOL (REPLACES STOOL CULTURE)

## 2024-03-31 LAB — BASIC METABOLIC PANEL WITH GFR
Anion gap: 6 (ref 5–15)
BUN: 14 mg/dL (ref 6–20)
CO2: 21 mmol/L — ABNORMAL LOW (ref 22–32)
Calcium: 8.2 mg/dL — ABNORMAL LOW (ref 8.9–10.3)
Chloride: 109 mmol/L (ref 98–111)
Creatinine, Ser: 1.34 mg/dL — ABNORMAL HIGH (ref 0.61–1.24)
GFR, Estimated: >60 mL/min (ref >60)
Glucose, Bld: 73 mg/dL (ref 70–99)
Potassium: 4.3 mmol/L (ref 3.5–5.1)
Sodium: 136 mmol/L (ref 135–145)

## 2024-03-31 LAB — CBC
HCT: 28.5 % — ABNORMAL LOW (ref 39.0–52.0)
Hemoglobin: 10 g/dL — ABNORMAL LOW (ref 13.0–17.0)
MCH: 35.2 pg — ABNORMAL HIGH (ref 26.0–34.0)
MCHC: 35.1 g/dL (ref 30.0–36.0)
MCV: 100.4 fL — ABNORMAL HIGH (ref 80.0–100.0)
Platelets: 162 K/uL (ref 150–400)
RBC: 2.84 MIL/uL — ABNORMAL LOW (ref 4.22–5.81)
RDW: 15.1 % (ref 11.5–15.5)
WBC: 4.3 K/uL (ref 4.0–10.5)
nRBC: 0 % (ref 0.0–0.2)

## 2024-03-31 MED ORDER — NITROFURANTOIN MONOHYD MACRO 100 MG PO CAPS: 100.0000 mg | ORAL_CAPSULE | Freq: Two times a day (BID) | ORAL | 0 refills | Status: AC

## 2024-03-31 NOTE — Plan of Care (Signed)
  Problem: Health Behavior/Discharge Planning: Goal: Ability to manage health-related needs will improve Outcome: Progressing   Problem: Activity: Goal: Risk for activity intolerance will decrease Outcome: Progressing   Problem: Nutrition: Goal: Adequate nutrition will be maintained Outcome: Progressing   Problem: Elimination: Goal: Will not experience complications related to bowel motility Outcome: Progressing Goal: Will not experience complications related to urinary retention Outcome: Progressing   Problem: Safety: Goal: Ability to remain free from injury will improve Outcome: Progressing

## 2024-03-31 NOTE — Discharge Summary (Signed)
 Physician Discharge Summary   Patient: Larry Pham MRN: [id number] DOB: Dec 30, 1965  Admit date:     03/28/2024  Discharge date: 03/31/24  Discharge Physician: Dr. Sherre   PCP: [facility]   Recommendations at discharge:   Drink 5-6 bottles of water  per day, cessation of soda drink.  And limit coffee/tea to 1 to 2 cups/day. Follow-up with your primary care doctor within 2 weeks of discharge. Hold your home hydrochlorothiazide  until you see your primary care doctor. Resume your home lisinopril  10 mg daily, metoprolol  succinate 25 mg daily on 11/24.  Discharge Diagnoses: Principal Problem:   AKI (acute kidney injury) Active Problems:   UTI (urinary tract infection)   Tobacco use   Hyperlipidemia with target LDL less than 70   Essential hypertension   Coronary artery disease involving native coronary artery of native heart with angina pectoris   PAD (peripheral artery disease)   CKD (chronic kidney disease), stage II   Hypotension   S/P CABG x 3   Appetite loss  Resolved Problems:   * No resolved hospital problems. [facility] Course:  Mr. Calem Cocozza is a 58 year old male with history of hyperlipidemia, GERD, hypertension, CAD status post CABG, PAD, no prior CKD, who presents to the ED on 03/28/2024 for chief concerns of hypotension.  03/28/2024: Patient was admitted to hospitalist service for acute kidney injury and hypotension.  Patient was treated with aggressive fluid hydration.  In the ED, patient received 2 L sodium chloride  bolus.  On admission, sodium chloride  500 mL bolus was given followed by sodium chloride  infusion at 125 mL/h.  11/21: I assumed care of the patient.  Renal function has improved however has not resumed to baseline.  Patient's baseline renal function is 1.14/eGFR > 60.  Serum creatinine today is 2.54/eGFR of 28.  Will transfer patient out of PCU and to telemetry medical floor.  11/22: Serum creatinine noted to be  1.82/eGFR 42 today.  This is an improvement however still not back to patient's baseline of sCr 1.4/eGFT > 60.  11/23: sCr continued to improve today at 1.34/eGFR > 60. Home antihypertensive medications were resumed.  Patient will be discharged and he is agreeable.  Assessment and Plan:  * AKI (acute kidney injury) On no prior CKD Secondary to prerenal in setting of poor p.o. intake leading to hypotension Registered dietitian has been consulted Strict I's and O's Continue NS infusion at 150 mL/h, 1 day ordered Recheck CMP in the a.m. 11/22: sCr 1.82/eGFR 42.  Sodium chloride  infusion renewed and changed to 125 ml/hr, 1 day ordered 11/23: sCr continued to improve today at 1.34/eGFR > 60.   UTI (urinary tract infection) Diagnosed outpatient, was on bactrim . He took 1.5 days of it and refuses it in the hospital due to N/V. Nitrofurantoin  100 mg BID, 5 days ordered  Tobacco use As needed nicotine  patch ordered  Appetite loss Happen suddenly 2 to 3 weeks ago He cannot taste his food Query upper respiratory infection including COVID Check respiratory panel, COVID/influenza A/influenza B/RSV PCR  Hypotension Suspect secondary to poor p.o. intake Continue aggressive fluid hydration  PAD (peripheral artery disease) Aspirin  81 mg daily  Coronary artery disease involving native coronary artery of native heart with angina pectoris Home aspirin  81 mg daily, atorvastatin  80 mg daily  Essential hypertension Home antihypertensive medications have been held on admission due to low blood pressure and acute kidney injury Hydrochlorothiazide  25 mg daily, lisinopril  10 mg daily, metoprolol  succinate 25  mg daily have been held Hydralazine  5 mg IV every 4 hours as needed for SBP greater than 165, 5 days ordered Home lisinopril  10 mg daily, metoprolol  succinate 25 mg daily were resumed on discharge.  Hold your home hydrochlorothiazide  25 mg daily until you see your primary care doctor.  Patient  endorses understanding and compliance.  Hyperlipidemia with target LDL less than 70 Atorvastatin  80 mg daily, ezetimibe  10 mg daily      Pain control - [place]  Controlled Substance Reporting System database was reviewed. and patient was instructed, not to drive, operate heavy machinery, perform activities at heights, swimming or participation in water  activities or provide baby-sitting services while on Pain, Sleep and Anxiety Medications; until their outpatient Physician has advised to do so again. Also recommended to not to take more than prescribed Pain, Sleep and Anxiety Medications.  Consultants: None Procedures performed: None Disposition: Home Diet recommendation:  Regular diet DISCHARGE MEDICATION: Allergies as of 03/31/2024       Reactions   Imdur  [isosorbide  Nitrate] Other (See Comments)   Made CP worse   Bee Venom Swelling   SWELLING REACTION UNSPECIFIED         Medication List     PAUSE taking these medications    hydrochlorothiazide  25 MG tablet Wait to take this until your doctor or other care provider tells you to start again. Commonly known as: HYDRODIURIL  Take 1 tablet (25 mg total) by mouth daily.       STOP taking these medications    cilostazol  100 MG tablet Commonly known as: PLETAL    cilostazol  50 MG tablet Commonly known as: PLETAL    gabapentin  600 MG tablet Commonly known as: NEURONTIN    sulfamethoxazole -trimethoprim  800-160 MG tablet Commonly known as: BACTRIM  DS       TAKE these medications    albuterol  108 (90 Base) MCG/ACT inhaler Commonly known as: VENTOLIN  HFA Inhale 2 puffs into the lungs every 6 (six) hours as needed for wheezing or shortness of breath.   alfuzosin  10 MG 24 hr tablet Commonly known as: UROXATRAL  Take 1 tablet (10 mg total) by mouth at bedtime.   aspirin  EC 81 MG tablet Take 81 mg by mouth daily.   atorvastatin  80 MG tablet Commonly known as: LIPITOR  Take 1 tablet (80 mg total) by mouth  daily.   cetirizine 10 MG tablet Commonly known as: ZYRTEC Take 10 mg by mouth daily.   DULoxetine 30 MG capsule Commonly known as: CYMBALTA Take 30 mg by mouth 2 (two) times daily.   ezetimibe  10 MG tablet Commonly known as: ZETIA  TAKE 1 TABLET BY MOUTH EVERY DAY   lisinopril  10 MG tablet Commonly known as: ZESTRIL  TAKE 1 TABLET BY MOUTH ONCE DAILY.   metFORMIN 500 MG tablet Commonly known as: GLUCOPHAGE Take 500 mg by mouth every morning.   metoprolol  succinate 25 MG 24 hr tablet Commonly known as: TOPROL -XL Take 25 mg by mouth in the morning and at bedtime.   nitrofurantoin  (macrocrystal-monohydrate) 100 MG capsule Commonly known as: MACROBID  Take 1 capsule (100 mg total) by mouth every 12 (twelve) hours for 8 doses.   ondansetron  4 MG tablet Commonly known as: ZOFRAN  Take 1 tablet (4 mg total) by mouth every 8 (eight) hours as needed for nausea.   pantoprazole  20 MG tablet Commonly known as: PROTONIX  Take 1 tablet (20 mg total) by mouth daily.   pregabalin 75 MG capsule Commonly known as: LYRICA Take 75 mg by mouth 2 (two) times daily.   Vitamin  D (Ergocalciferol) 1.25 MG (50000 UNIT) Caps capsule Commonly known as: DRISDOL Take 1 capsule by mouth 2 (two) times a week.       Discharge Exam: Filed Weights   03/28/24 1133  Weight: 101 kg   Physical Exam Constitutional:      Appearance: Normal appearance.  HENT:     Head: Normocephalic and atraumatic.     Right Ear: External ear normal.     Left Ear: External ear normal.     Nose: Nose normal.     Mouth/Throat:     Mouth: Mucous membranes are moist.     Pharynx: Oropharynx is clear.  Eyes:     Extraocular Movements: Extraocular movements intact.     Conjunctiva/sclera: Conjunctivae normal.     Pupils: Pupils are equal, round, and reactive to light.  Cardiovascular:     Rate and Rhythm: Normal rate and regular rhythm.     Pulses: Normal pulses.     Heart sounds: Normal heart sounds.  Pulmonary:      Effort: Pulmonary effort is normal.     Breath sounds: Normal breath sounds.  Abdominal:     General: Abdomen is flat. There is no distension.     Palpations: Abdomen is soft.     Tenderness: There is no abdominal tenderness.  Musculoskeletal:        General: No swelling. Normal range of motion.     Cervical back: Normal range of motion.  Skin:    General: Skin is warm and dry.     Capillary Refill: Capillary refill takes less than 2 seconds.  Neurological:     General: No focal deficit present.     Mental Status: He is alert and oriented to person, place, and time.  Psychiatric:        Mood and Affect: Mood normal.        Behavior: Behavior normal.        Thought Content: Thought content normal.   Condition at discharge: good  The results of significant diagnostics from this hospitalization (including imaging, microbiology, ancillary and laboratory) are listed below for reference.   Imaging Studies: DG Chest Portable 1 View Result Date: 03/28/2024 EXAM: 1 VIEW(S) XRAY OF THE CHEST 03/28/2024 02:34:00 PM COMPARISON: 11/28/2019 status post coronary artery bypass graft. CLINICAL HISTORY: hypotension FINDINGS: LUNGS AND PLEURA: No focal pulmonary opacity. No pleural effusion. No pneumothorax. HEART AND MEDIASTINUM: Status post coronary artery bypass graft. No acute abnormality of the cardiac and mediastinal silhouettes. BONES AND SOFT TISSUES: No acute osseous abnormality. IMPRESSION: 1. No acute cardiopulmonary process. Electronically signed by: Lynwood Seip MD 03/28/2024 03:06 PM EST RP Workstation: [id number]   CT ABDOMEN PELVIS W CONTRAST Result Date: 03/25/2024 CLINICAL DATA:  Abdominal pain and weight loss. EXAM: CT ABDOMEN AND PELVIS WITH CONTRAST TECHNIQUE: Multidetector CT imaging of the abdomen and pelvis was performed using the standard protocol following bolus administration of intravenous contrast. RADIATION DOSE REDUCTION: This exam was performed according to the  departmental dose-optimization program which includes automated exposure control, adjustment of the mA and/or kV according to patient size and/or use of iterative reconstruction technique. CONTRAST:  OMNIPAQUE  IOHEXOL  300 MG/ML  SOLN COMPARISON:  CTA chest abdomen and pelvis 10/14/2018 FINDINGS: Lower chest: There is a calcified granuloma in the right middle lobe. Hepatobiliary: Marked diffuse fatty infiltration of the liver is unchanged. Gallbladder and bile ducts are within normal limits. Pancreas: Unremarkable. No pancreatic ductal dilatation or surrounding inflammatory changes. Spleen: Normal in size without focal abnormality.  Adrenals/Urinary Tract: There is lobulated enhancing nodular density protruding from the prostate into the bladder base measuring 2.7 x 4.3 cm which has increased in size. There is diffuse bladder wall thickening versus normal under distention. There subcentimeter hypodensities in the right kidney which are too small to characterize, likely cysts. Otherwise, adrenal glands and kidneys are within normal limits. Stomach/Bowel: Stomach is within normal limits. Appendix appears normal. No evidence of bowel wall thickening, distention, or inflammatory changes. There are few scattered colonic diverticula. Vascular/Lymphatic: Aortic atherosclerosis. No enlarged abdominal or pelvic lymph nodes. Reproductive: Prostate gland is diffusely heterogeneous and mildly enlarged with nodular protrusion into the bladder base. Other: No abdominal wall hernia or abnormality. No abdominopelvic ascites. Musculoskeletal: No acute or significant osseous findings. IMPRESSION: 1. No acute localizing process in the abdomen or pelvis. 2. Marked fatty infiltration of the liver. 3. Colonic diverticulosis without evidence for diverticulitis. 4. Enlarged heterogeneous prostate gland with enlarging nodular protrusion into the bladder base. Recommend correlation with PSA. 5. Diffuse bladder wall thickening versus  normal under distention. Correlate clinically for cystitis. 6. Aortic atherosclerosis. Aortic Atherosclerosis (ICD10-I70.0). Electronically Signed   By: Greig Pique M.D.   On: 03/25/2024 16:25   Microbiology: Results for orders placed or performed during the hospital encounter of 03/28/24  Culture, blood (routine x 2)     Status: None (Preliminary result)   Collection Time: 03/28/24  1:18 PM   Specimen: BLOOD LEFT ARM  Result Value Ref Range Status   Specimen Description BLOOD LEFT ARM  Final   Special Requests   Final    BOTTLES DRAWN AEROBIC AND ANAEROBIC Blood Culture adequate volume   Culture   Final    NO GROWTH 2 DAYS Performed at [facility], [address] [place], [place] [address]    Report Status PENDING  Incomplete  Urine Culture     Status: Abnormal   Collection Time: 03/28/24  1:35 PM   Specimen: Urine, Clean Catch  Result Value Ref Range Status   Specimen Description URINE, CLEAN CATCH  Final   Special Requests NONE  Final   Culture (A)  Final    <10,000 COLONIES/mL INSIGNIFICANT GROWTH Performed at [facility], [address], [place], [place] [address]    Report Status 03/29/2024 FINAL  Final  Culture, blood (routine x 2)     Status: None (Preliminary result)   Collection Time: 03/28/24  7:55 PM   Specimen: BLOOD  Result Value Ref Range Status   Specimen Description BLOOD SITE NOT SPECIFIED  Final   Special Requests   Final    BOTTLES DRAWN AEROBIC ONLY Blood Culture adequate volume   Culture   Final    NO GROWTH 2 DAYS Performed at [facility], [address], [place], [place] [address]    Report Status PENDING  Incomplete  Resp panel by RT-PCR (RSV, Flu A&B, Covid) Anterior Nasal Swab     Status: None   Collection Time: 03/29/24 11:11 AM   Specimen: Anterior Nasal Swab  Result Value Ref Range Status   SARS Coronavirus 2 by RT PCR NEGATIVE NEGATIVE Final   Influenza A by PCR NEGATIVE NEGATIVE Final   Influenza B by PCR NEGATIVE  NEGATIVE Final    Comment: (NOTE) The Xpert Xpress SARS-CoV-2/FLU/RSV plus assay is intended as an aid in the diagnosis of influenza from Nasopharyngeal swab specimens and should not be used as a sole basis for treatment. Nasal washings and aspirates are unacceptable for Xpert  Xpress SARS-CoV-2/FLU/RSV testing.  Fact Sheet for Patients: bloggercourse.com  Fact Sheet for Healthcare Providers: seriousbroker.it  This test is not yet approved or cleared by the [place]  FDA and has been authorized for detection and/or diagnosis of SARS-CoV-2 by FDA under an Emergency Use Authorization (EUA). This EUA will remain in effect (meaning this test can be used) for the duration of the COVID-19 declaration under Section 564(b)(1) of the Act, 21 U.S.C. section 360bbb-3(b)(1), unless the authorization is terminated or revoked.     Resp Syncytial Virus by PCR NEGATIVE NEGATIVE Final    Comment: (NOTE) Fact Sheet for Patients: bloggercourse.com  Fact Sheet for Healthcare Providers: seriousbroker.it  This test is not yet approved or cleared by the [place]  FDA and has been authorized for detection and/or diagnosis of SARS-CoV-2 by FDA under an Emergency Use Authorization (EUA). This EUA will remain in effect (meaning this test can be used) for the duration of the COVID-19 declaration under Section 564(b)(1) of the Act, 21 U.S.C. section 360bbb-3(b)(1), unless the authorization is terminated or revoked.  Performed at [facility], [address] [place], [place] [address]   Gastrointestinal Panel by PCR , Stool     Status: None   Collection Time: 03/29/24  4:24 PM   Specimen: Stool  Result Value Ref Range Status   Campylobacter species NOT DETECTED NOT DETECTED Final   Plesimonas shigelloides NOT DETECTED NOT DETECTED Final   Salmonella species NOT DETECTED NOT DETECTED Final    Yersinia enterocolitica NOT DETECTED NOT DETECTED Final   Vibrio species NOT DETECTED NOT DETECTED Final   Vibrio cholerae NOT DETECTED NOT DETECTED Final   Enteroaggregative E coli (EAEC) NOT DETECTED NOT DETECTED Final   Enteropathogenic E coli (EPEC) NOT DETECTED NOT DETECTED Final   Enterotoxigenic E coli (ETEC) NOT DETECTED NOT DETECTED Final   Shiga like toxin producing E coli (STEC) NOT DETECTED NOT DETECTED Final   Shigella/Enteroinvasive E coli (EIEC) NOT DETECTED NOT DETECTED Final   Cryptosporidium NOT DETECTED NOT DETECTED Final   Cyclospora cayetanensis NOT DETECTED NOT DETECTED Final   Entamoeba histolytica NOT DETECTED NOT DETECTED Final   Giardia lamblia NOT DETECTED NOT DETECTED Final   Adenovirus F40/41 NOT DETECTED NOT DETECTED Final   Astrovirus NOT DETECTED NOT DETECTED Final   Norovirus GI/GII NOT DETECTED NOT DETECTED Final   Rotavirus A NOT DETECTED NOT DETECTED Final   Sapovirus (I, II, IV, and V) NOT DETECTED NOT DETECTED Final    Comment: Performed at [facility], [address] [place], [place] [address]  C Difficile Quick Screen w PCR reflex     Status: None   Collection Time: 03/29/24  4:25 PM   Specimen: Stool  Result Value Ref Range Status   C Diff antigen NEGATIVE NEGATIVE Final   C Diff toxin NEGATIVE NEGATIVE Final   C Diff interpretation No C. difficile detected.  Final    Comment: Performed at [facility], [address] [place], [place] [address]   Labs reviewed: CBC: Recent Labs  Lab 03/25/24 1254 03/28/24 1217 03/29/24 0423 03/30/24 0614 03/31/24 0618  WBC 7.0 7.5 5.9 5.1 4.3  NEUTROABS 4.9 5.0  --   --   --   HGB 13.9 12.5* 11.2* 10.1* 10.0*  HCT 39.7 34.7* 32.6* 29.4* 28.5*  MCV 100.3* 98.3 101.9* 100.7* 100.4*  PLT 167 166 131* 149* 162   Basic Metabolic Panel: Recent Labs  Lab 03/25/24 1254 03/28/24 1217 03/29/24 0423 03/30/24 0614 03/31/24  0618  NA 137 131* 135 135 136  K 4.0 4.1 3.9 4.4 4.3  CL  97* 96* 105 107 109  CO2 25 22 19* 19* 21*  GLUCOSE 87 91 81 78 73  BUN 12 25* 24* 21* 14  CREATININE 1.14 3.12* 2.54* 1.82* 1.34*  CALCIUM  9.9 9.2 8.2* 8.0* 8.2*   Liver Function Tests: Recent Labs  Lab 03/25/24 1254 03/28/24 1217 03/29/24 0423 03/30/24 0614  AST 116* 65* 49* 46*  ALT 79* 52* 40 38  ALKPHOS 118 78 63 50  BILITOT 1.7* 1.2 1.1 0.9  PROT 7.6 6.8 5.5* 5.0*  ALBUMIN  4.4 3.6 2.8* 2.6*   CBG: No results for input(s): GLUCAP in the last 168 hours.  Discharge time spent: greater than 30 minutes.  Signed: Dr. Sherre [facility] 03/31/2024

## 2024-04-02 LAB — CULTURE, BLOOD (ROUTINE X 2)
Culture: NO GROWTH
Culture: NO GROWTH
Special Requests: ADEQUATE
Special Requests: ADEQUATE

## 2024-04-15 ENCOUNTER — Telehealth: Payer: Self-pay

## 2024-04-15 DIAGNOSIS — N401 Enlarged prostate with lower urinary tract symptoms: Secondary | ICD-10-CM

## 2024-04-15 DIAGNOSIS — N390 Urinary tract infection, site not specified: Secondary | ICD-10-CM

## 2024-04-15 MED ORDER — DOXYCYCLINE HYCLATE 100 MG PO CAPS: 100.0000 mg | ORAL_CAPSULE | Freq: Two times a day (BID) | ORAL | 0 refills | Status: AC

## 2024-04-15 NOTE — Telephone Encounter (Signed)
 Pt called stating his symptoms are not getting better from when he went to the ED it feels like he is sitting on a water  balloon pt states he is till taking his alfuzosin  per pt advised  MD would be notified  verbal from MD McKenzie,  have pt try doxycycline . Pt made aware via vm

## 2024-04-25 ENCOUNTER — Telehealth: Payer: Self-pay | Admitting: Urology

## 2024-04-25 NOTE — Telephone Encounter (Signed)
 Patient had reaction to medication Dr Sherrilee gave him and was hospitalized for 6 days. His PCP gave him doxycycline  and he finished that and still has the same issue. He would like to see Dr Sherrilee. Please call [phone]

## 2024-04-26 NOTE — Telephone Encounter (Signed)
 Tried calling pt with no answer. LVM for return call to office.

## 2024-05-06 ENCOUNTER — Telehealth: Payer: Self-pay

## 2024-05-06 NOTE — Telephone Encounter (Signed)
 Patient called with symptoms of feeling like he is sitting on a rubber balloon he's constipated and urine is slow to come out patient states previous symptoms have returned and wanted a sooner appt pt advised that he will be added to the waitlist and a message will be sent to MD

## 2024-05-14 NOTE — Telephone Encounter (Signed)
 Called patient to give him MD McKenzie recommendations patient voiced his understanding

## 2024-05-23 ENCOUNTER — Encounter (INDEPENDENT_AMBULATORY_CARE_PROVIDER_SITE_OTHER): Payer: Self-pay | Admitting: *Deleted

## 2024-07-24 ENCOUNTER — Ambulatory Visit: Admitting: Urology
